# Patient Record
Sex: Female | Born: 1960
Health system: Southern US, Community
[De-identification: ages and names within clinical notes are randomized; demographics above are authoritative.]

## PROBLEM LIST (undated history)

## (undated) DIAGNOSIS — J849 Interstitial pulmonary disease, unspecified: Secondary | ICD-10-CM

## (undated) DIAGNOSIS — S92902A Unspecified fracture of left foot, initial encounter for closed fracture: Secondary | ICD-10-CM

## (undated) DIAGNOSIS — M069 Rheumatoid arthritis, unspecified: Secondary | ICD-10-CM

## (undated) DIAGNOSIS — J449 Chronic obstructive pulmonary disease, unspecified: Secondary | ICD-10-CM

## (undated) DIAGNOSIS — I509 Heart failure, unspecified: Secondary | ICD-10-CM

## (undated) DIAGNOSIS — IMO0002 Reserved for concepts with insufficient information to code with codable children: Secondary | ICD-10-CM

## (undated) DIAGNOSIS — J189 Pneumonia, unspecified organism: Secondary | ICD-10-CM

## (undated) HISTORY — PX: COLONOSCOPY: SHX174

## (undated) HISTORY — PX: WISDOM TOOTH EXTRACTION: SHX21

## (undated) HISTORY — DX: Unspecified fracture of left foot, initial encounter for closed fracture: S92.902A

## (undated) HISTORY — DX: Pneumonia, unspecified organism: J18.9

## (undated) HISTORY — DX: Rheumatoid arthritis, unspecified: M06.9

## (undated) HISTORY — DX: Reserved for concepts with insufficient information to code with codable children: IMO0002

---

## 1997-09-06 HISTORY — PX: PILONIDAL CYST EXCISION: SHX744

## 1999-12-09 ENCOUNTER — Encounter: Payer: Self-pay | Admitting: Obstetrics and Gynecology

## 1999-12-09 ENCOUNTER — Ambulatory Visit (HOSPITAL_COMMUNITY): Admission: RE | Admit: 1999-12-09 | Discharge: 1999-12-09 | Payer: Self-pay | Admitting: Obstetrics and Gynecology

## 2000-12-26 ENCOUNTER — Encounter: Payer: Self-pay | Admitting: Obstetrics and Gynecology

## 2000-12-26 ENCOUNTER — Ambulatory Visit (HOSPITAL_COMMUNITY): Admission: RE | Admit: 2000-12-26 | Discharge: 2000-12-26 | Payer: Self-pay | Admitting: Obstetrics and Gynecology

## 2001-12-08 ENCOUNTER — Encounter: Payer: Self-pay | Admitting: Obstetrics and Gynecology

## 2001-12-08 ENCOUNTER — Other Ambulatory Visit: Admission: RE | Admit: 2001-12-08 | Discharge: 2001-12-08 | Payer: Self-pay | Admitting: Obstetrics and Gynecology

## 2001-12-08 ENCOUNTER — Ambulatory Visit (HOSPITAL_COMMUNITY): Admission: RE | Admit: 2001-12-08 | Discharge: 2001-12-08 | Payer: Self-pay | Admitting: Obstetrics and Gynecology

## 2002-12-11 ENCOUNTER — Encounter: Payer: Self-pay | Admitting: Obstetrics and Gynecology

## 2002-12-11 ENCOUNTER — Ambulatory Visit (HOSPITAL_COMMUNITY): Admission: RE | Admit: 2002-12-11 | Discharge: 2002-12-11 | Payer: Self-pay | Admitting: Obstetrics and Gynecology

## 2002-12-11 ENCOUNTER — Other Ambulatory Visit: Admission: RE | Admit: 2002-12-11 | Discharge: 2002-12-11 | Payer: Self-pay | Admitting: Obstetrics and Gynecology

## 2004-01-03 ENCOUNTER — Other Ambulatory Visit: Admission: RE | Admit: 2004-01-03 | Discharge: 2004-01-03 | Payer: Self-pay | Admitting: Obstetrics and Gynecology

## 2004-01-03 ENCOUNTER — Ambulatory Visit (HOSPITAL_COMMUNITY): Admission: RE | Admit: 2004-01-03 | Discharge: 2004-01-03 | Payer: Self-pay | Admitting: Obstetrics and Gynecology

## 2005-01-05 ENCOUNTER — Ambulatory Visit (HOSPITAL_COMMUNITY): Admission: RE | Admit: 2005-01-05 | Discharge: 2005-01-05 | Payer: Self-pay | Admitting: Obstetrics and Gynecology

## 2005-01-05 ENCOUNTER — Other Ambulatory Visit: Admission: RE | Admit: 2005-01-05 | Discharge: 2005-01-05 | Payer: Self-pay | Admitting: Obstetrics and Gynecology

## 2006-01-07 ENCOUNTER — Ambulatory Visit (HOSPITAL_COMMUNITY): Admission: RE | Admit: 2006-01-07 | Discharge: 2006-01-07 | Payer: Self-pay | Admitting: Obstetrics and Gynecology

## 2006-01-07 ENCOUNTER — Other Ambulatory Visit: Admission: RE | Admit: 2006-01-07 | Discharge: 2006-01-07 | Payer: Self-pay | Admitting: Obstetrics and Gynecology

## 2006-09-06 DIAGNOSIS — IMO0002 Reserved for concepts with insufficient information to code with codable children: Secondary | ICD-10-CM

## 2006-09-06 DIAGNOSIS — R87619 Unspecified abnormal cytological findings in specimens from cervix uteri: Secondary | ICD-10-CM

## 2006-09-06 HISTORY — DX: Reserved for concepts with insufficient information to code with codable children: IMO0002

## 2006-09-06 HISTORY — PX: CERVICAL BIOPSY  W/ LOOP ELECTRODE EXCISION: SUR135

## 2006-09-06 HISTORY — DX: Unspecified abnormal cytological findings in specimens from cervix uteri: R87.619

## 2007-02-22 ENCOUNTER — Other Ambulatory Visit: Admission: RE | Admit: 2007-02-22 | Discharge: 2007-02-22 | Payer: Self-pay | Admitting: Obstetrics and Gynecology

## 2007-02-22 ENCOUNTER — Ambulatory Visit (HOSPITAL_COMMUNITY): Admission: RE | Admit: 2007-02-22 | Discharge: 2007-02-22 | Payer: Self-pay | Admitting: Obstetrics & Gynecology

## 2007-08-18 ENCOUNTER — Other Ambulatory Visit: Admission: RE | Admit: 2007-08-18 | Discharge: 2007-08-18 | Payer: Self-pay | Admitting: Obstetrics and Gynecology

## 2007-11-10 ENCOUNTER — Other Ambulatory Visit: Admission: RE | Admit: 2007-11-10 | Discharge: 2007-11-10 | Payer: Self-pay | Admitting: Obstetrics and Gynecology

## 2008-02-28 ENCOUNTER — Ambulatory Visit (HOSPITAL_COMMUNITY): Admission: RE | Admit: 2008-02-28 | Discharge: 2008-02-28 | Payer: Self-pay | Admitting: Obstetrics and Gynecology

## 2008-02-28 ENCOUNTER — Other Ambulatory Visit: Admission: RE | Admit: 2008-02-28 | Discharge: 2008-02-28 | Payer: Self-pay | Admitting: Obstetrics and Gynecology

## 2008-07-09 ENCOUNTER — Other Ambulatory Visit: Admission: RE | Admit: 2008-07-09 | Discharge: 2008-07-09 | Payer: Self-pay | Admitting: Obstetrics and Gynecology

## 2009-03-11 ENCOUNTER — Ambulatory Visit (HOSPITAL_COMMUNITY): Admission: RE | Admit: 2009-03-11 | Discharge: 2009-03-11 | Payer: Self-pay | Admitting: Obstetrics and Gynecology

## 2009-03-26 ENCOUNTER — Ambulatory Visit: Payer: Self-pay | Admitting: Family Medicine

## 2009-03-26 DIAGNOSIS — M2550 Pain in unspecified joint: Secondary | ICD-10-CM | POA: Insufficient documentation

## 2009-03-26 DIAGNOSIS — F329 Major depressive disorder, single episode, unspecified: Secondary | ICD-10-CM | POA: Insufficient documentation

## 2009-03-26 DIAGNOSIS — G5600 Carpal tunnel syndrome, unspecified upper limb: Secondary | ICD-10-CM | POA: Insufficient documentation

## 2009-03-27 LAB — CONVERTED CEMR LAB
Anti Nuclear Antibody(ANA): NEGATIVE
Basophils Absolute: 0.1 10*3/uL (ref 0.0–0.1)
CRP, High Sensitivity: 14 — ABNORMAL HIGH (ref 0.00–5.00)
Calcium: 9.3 mg/dL (ref 8.4–10.5)
Chloride: 106 meq/L (ref 96–112)
Hemoglobin: 13.1 g/dL (ref 12.0–15.0)
Lymphs Abs: 2.2 10*3/uL (ref 0.7–4.0)
Monocytes Absolute: 0.5 10*3/uL (ref 0.1–1.0)
Neutrophils Relative %: 56.2 % (ref 43.0–77.0)
Platelets: 315 10*3/uL (ref 150.0–400.0)
Potassium: 4.1 meq/L (ref 3.5–5.1)
RBC: 4.19 M/uL (ref 3.87–5.11)

## 2009-07-07 DIAGNOSIS — M069 Rheumatoid arthritis, unspecified: Secondary | ICD-10-CM

## 2009-07-07 HISTORY — DX: Rheumatoid arthritis, unspecified: M06.9

## 2010-04-09 ENCOUNTER — Ambulatory Visit (HOSPITAL_COMMUNITY): Admission: RE | Admit: 2010-04-09 | Discharge: 2010-04-09 | Payer: Self-pay | Admitting: Obstetrics and Gynecology

## 2010-09-27 ENCOUNTER — Encounter: Payer: Self-pay | Admitting: Obstetrics and Gynecology

## 2011-04-21 ENCOUNTER — Other Ambulatory Visit: Payer: Self-pay | Admitting: Obstetrics and Gynecology

## 2011-04-21 DIAGNOSIS — Z1231 Encounter for screening mammogram for malignant neoplasm of breast: Secondary | ICD-10-CM

## 2011-05-20 ENCOUNTER — Ambulatory Visit (HOSPITAL_COMMUNITY): Payer: Managed Care, Other (non HMO)

## 2011-05-21 ENCOUNTER — Ambulatory Visit (HOSPITAL_COMMUNITY)
Admission: RE | Admit: 2011-05-21 | Discharge: 2011-05-21 | Disposition: A | Discharge status: Home / Self Care | Payer: Managed Care, Other (non HMO) | Source: Ambulatory Visit | Attending: Obstetrics and Gynecology | Admitting: Obstetrics and Gynecology

## 2011-05-21 DIAGNOSIS — Z1231 Encounter for screening mammogram for malignant neoplasm of breast: Secondary | ICD-10-CM | POA: Insufficient documentation

## 2012-04-24 ENCOUNTER — Other Ambulatory Visit: Payer: Self-pay | Admitting: Obstetrics and Gynecology

## 2012-04-24 DIAGNOSIS — Z1231 Encounter for screening mammogram for malignant neoplasm of breast: Secondary | ICD-10-CM

## 2012-05-22 ENCOUNTER — Ambulatory Visit (HOSPITAL_COMMUNITY)
Admission: RE | Admit: 2012-05-22 | Discharge: 2012-05-22 | Disposition: A | Discharge status: Home / Self Care | Payer: Managed Care, Other (non HMO) | Source: Ambulatory Visit | Attending: Obstetrics and Gynecology | Admitting: Obstetrics and Gynecology

## 2012-05-22 DIAGNOSIS — Z1231 Encounter for screening mammogram for malignant neoplasm of breast: Secondary | ICD-10-CM

## 2013-04-26 ENCOUNTER — Other Ambulatory Visit: Payer: Self-pay | Admitting: Obstetrics and Gynecology

## 2013-04-26 DIAGNOSIS — Z1231 Encounter for screening mammogram for malignant neoplasm of breast: Secondary | ICD-10-CM

## 2013-05-04 ENCOUNTER — Encounter: Payer: Self-pay | Admitting: Obstetrics and Gynecology

## 2013-05-04 ENCOUNTER — Other Ambulatory Visit: Payer: Self-pay | Admitting: Obstetrics and Gynecology

## 2013-05-04 ENCOUNTER — Ambulatory Visit: Payer: Self-pay | Admitting: Obstetrics and Gynecology

## 2013-05-04 ENCOUNTER — Ambulatory Visit (INDEPENDENT_AMBULATORY_CARE_PROVIDER_SITE_OTHER): Payer: Managed Care, Other (non HMO) | Admitting: Obstetrics and Gynecology

## 2013-05-04 VITALS — BP 122/78 | HR 100 | Resp 20 | Ht 63.25 in | Wt 124.0 lb

## 2013-05-04 DIAGNOSIS — Z01419 Encounter for gynecological examination (general) (routine) without abnormal findings: Secondary | ICD-10-CM

## 2013-05-04 DIAGNOSIS — Z0000 Encounter for general adult medical examination without abnormal findings: Secondary | ICD-10-CM

## 2013-05-04 LAB — HEMOGLOBIN, FINGERSTICK: Hemoglobin, fingerstick: 14.5 g/dL (ref 12.0–16.0)

## 2013-05-04 LAB — POCT URINALYSIS DIPSTICK
Bilirubin, UA: NEGATIVE
Glucose, UA: NEGATIVE
Ketones, UA: NEGATIVE
Leukocytes, UA: NEGATIVE
Nitrite, UA: NEGATIVE
Urobilinogen, UA: NEGATIVE

## 2013-05-04 MED ORDER — ESCITALOPRAM OXALATE 10 MG PO TABS: 10.0000 mg | ORAL_TABLET | Freq: Every day | ORAL | Status: DC

## 2013-05-04 NOTE — Patient Instructions (Signed)

## 2013-05-04 NOTE — Progress Notes (Signed)
52 y.o.   Married    Caucasian   female   G1P1   here for annual exam.    Patient's last menstrual period was 09/06/2004.          Sexually active: yes  The current method of family planning is post menopausal status.    Exercising: walking 5-7 days a week Last mammogram:  05/22/12 neg, appt 05/23/13 Last pap smear:05/03/12 neg History of abnormal pap CIN 3 05/2007  Smoking: 1/2 pack of cigarettes a day Alcohol: occ glass of wine Last colonoscopy: never Last Bone Density:  05/22/12 normal Last tetanus shot: less than 10 years Last cholesterol check: 11/14/09 normal  Hgb: 14.5                Urine: neg   Family History  Problem Relation Age of Onset  . Diabetes Mother   . Rheum arthritis Mother   . Diabetes Father   . COPD Father   . Bronchitis Father   . Cancer Brother     Patient Active Problem List   Diagnosis Date Noted  . DEPRESSION 03/26/2009  . CARPAL TUNNEL SYNDROME 03/26/2009  . ARTHRALGIA 03/26/2009    Past Medical History  Diagnosis Date  . Abnormal Pap smear 09/2006    HGSIL CIN 2/VAIN/ CIN 3/VAIN-3 CIS  . RA (rheumatoid arthritis) 07/2009    Past Surgical History  Procedure Laterality Date  . Cervical biopsy  w/ loop electrode excision  2008    CIN 3  . Pilonidal cyst excision  1999    I&D    Allergies: Codeine and Penicillins  Current Outpatient Prescriptions  Medication Sig Dispense Refill  . escitalopram (LEXAPRO) 10 MG tablet daily.       Marland Kitchen leflunomide (ARAVA) 20 MG tablet daily.       Marland Kitchen SIMPONI 50 MG/0.5ML SOLN every 30 (thirty) days.        No current facility-administered medications for this visit.    ROS: Pertinent items are noted in HPI.  Social Hx: married, one daughter Tamala Julian   Exam:    BP 122/78  Pulse 100  Resp 20  Ht 5' 3.25" (1.607 m)  Wt 124 lb (56.246 kg)  BMI 21.78 kg/m2  LMP 01/01/2006ht stable and wt up 3 pounds from last yr   Wt Readings from Last 3 Encounters:  05/04/13 124 lb (56.246 kg)  03/26/09 147 lb  (66.679 kg)     Ht Readings from Last 3 Encounters:  05/04/13 5' 3.25" (1.607 m)  03/26/09 5' 4.75" (1.645 m)    General appearance: alert, cooperative and appears stated age Head: Normocephalic, without obvious abnormality, atraumatic Neck: no adenopathy, supple, symmetrical, trachea midline and thyroid not enlarged, symmetric, no tenderness/mass/nodules Lungs: clear to auscultation bilaterally Breasts: Inspection negative, No nipple retraction or dimpling, No nipple discharge or bleeding, No axillary or supraclavicular adenopathy, Normal to palpation without dominant masses Heart: regular rate and rhythm Abdomen: soft, non-tender; bowel sounds normal; no masses,  no organomegaly Extremities: extremities normal, atraumatic, no cyanosis or edema Skin: Skin color, texture, turgor normal. No rashes or lesions Lymph nodes: Cervical, supraclavicular, and axillary nodes normal. No abnormal inguinal nodes palpated Neurologic: Grossly normal   Pelvic: External genitalia:  no lesions              Urethra:  normal appearing urethra with no masses, tenderness or lesions              Bartholins and Skenes: normal  Vagina: normal appearing vagina with normal color and discharge, no lesions              Cervix: normal appearance              Pap taken: yes        Bimanual Exam:  Uterus:  uterus is normal size, shape, consistency and nontender                                      Adnexa: normal adnexa in size, nontender and no masses                                      Rectovaginal: Confirms                                      Anus:  normal sphincter tone, no lesions  A: normal menopausal exam, no HRT     CIN 3 on LEEP 2008     Rheumatoid Arthritis     P:     mammogram counseled on breast self exam, mammography screening, adequate intake of calcium and vitamin D, diet and exercise return annually or prn   Last pap 01/11/2011, ASCUS neg HPV; cotesting due 2015 Refill lexapro  at pt request - doesn't want to come off yet  An After Visit Summary was printed and given to the patient.

## 2013-05-09 ENCOUNTER — Other Ambulatory Visit: Payer: Self-pay | Admitting: *Deleted

## 2013-05-09 MED ORDER — ESCITALOPRAM OXALATE 10 MG PO TABS: 10.0000 mg | ORAL_TABLET | Freq: Every day | ORAL | Status: DC

## 2013-05-09 NOTE — Telephone Encounter (Signed)
Fax request from pharmacy for RX for 90 day supply.  This is sent.

## 2013-05-23 ENCOUNTER — Ambulatory Visit (HOSPITAL_COMMUNITY)
Admission: RE | Admit: 2013-05-23 | Discharge: 2013-05-23 | Disposition: A | Discharge status: Home / Self Care | Payer: Managed Care, Other (non HMO) | Source: Ambulatory Visit | Attending: Obstetrics and Gynecology | Admitting: Obstetrics and Gynecology

## 2013-05-23 DIAGNOSIS — Z1231 Encounter for screening mammogram for malignant neoplasm of breast: Secondary | ICD-10-CM | POA: Insufficient documentation

## 2013-05-26 ENCOUNTER — Emergency Department (HOSPITAL_COMMUNITY)
Admission: EM | Admit: 2013-05-26 | Discharge: 2013-05-26 | Disposition: A | Discharge status: Home / Self Care | Payer: Managed Care, Other (non HMO) | Attending: Emergency Medicine | Admitting: Emergency Medicine

## 2013-05-26 ENCOUNTER — Encounter (HOSPITAL_COMMUNITY): Payer: Self-pay | Admitting: Emergency Medicine

## 2013-05-26 DIAGNOSIS — M069 Rheumatoid arthritis, unspecified: Secondary | ICD-10-CM | POA: Insufficient documentation

## 2013-05-26 DIAGNOSIS — F17200 Nicotine dependence, unspecified, uncomplicated: Secondary | ICD-10-CM | POA: Insufficient documentation

## 2013-05-26 DIAGNOSIS — Y92410 Unspecified street and highway as the place of occurrence of the external cause: Secondary | ICD-10-CM | POA: Insufficient documentation

## 2013-05-26 DIAGNOSIS — Z79899 Other long term (current) drug therapy: Secondary | ICD-10-CM | POA: Insufficient documentation

## 2013-05-26 DIAGNOSIS — Y9389 Activity, other specified: Secondary | ICD-10-CM | POA: Insufficient documentation

## 2013-05-26 DIAGNOSIS — Z880 Allergy status to penicillin: Secondary | ICD-10-CM | POA: Insufficient documentation

## 2013-05-26 DIAGNOSIS — Z043 Encounter for examination and observation following other accident: Secondary | ICD-10-CM | POA: Insufficient documentation

## 2013-05-26 MED ORDER — CYCLOBENZAPRINE HCL 5 MG PO TABS: 5.0000 mg | ORAL_TABLET | Freq: Two times a day (BID) | ORAL | Status: DC | PRN
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
## 2013-05-26 NOTE — ED Provider Notes (Signed)
Medical screening examination/treatment/procedure(s) were performed by non-physician practitioner and as supervising physician I was immediately available for consultation/collaboration.   Shanna Cisco, MD 05/26/13 1550

## 2014-02-16 ENCOUNTER — Encounter (HOSPITAL_COMMUNITY): Payer: Self-pay | Admitting: Emergency Medicine

## 2014-02-16 ENCOUNTER — Emergency Department (HOSPITAL_COMMUNITY): Payer: Managed Care, Other (non HMO)

## 2014-02-16 ENCOUNTER — Inpatient Hospital Stay (HOSPITAL_COMMUNITY)
Admission: EM | Admit: 2014-02-16 | Discharge: 2014-02-24 | DRG: 871 | Disposition: A | Discharge status: Home / Self Care | Payer: Managed Care, Other (non HMO) | Attending: Family Medicine | Admitting: Family Medicine

## 2014-02-16 DIAGNOSIS — Z91038 Other insect allergy status: Secondary | ICD-10-CM

## 2014-02-16 DIAGNOSIS — S27309A Unspecified injury of lung, unspecified, initial encounter: Secondary | ICD-10-CM

## 2014-02-16 DIAGNOSIS — Z79899 Other long term (current) drug therapy: Secondary | ICD-10-CM

## 2014-02-16 DIAGNOSIS — A419 Sepsis, unspecified organism: Secondary | ICD-10-CM | POA: Diagnosis present

## 2014-02-16 DIAGNOSIS — M2550 Pain in unspecified joint: Secondary | ICD-10-CM

## 2014-02-16 DIAGNOSIS — F3289 Other specified depressive episodes: Secondary | ICD-10-CM

## 2014-02-16 DIAGNOSIS — R0902 Hypoxemia: Secondary | ICD-10-CM | POA: Diagnosis not present

## 2014-02-16 DIAGNOSIS — F17200 Nicotine dependence, unspecified, uncomplicated: Secondary | ICD-10-CM | POA: Diagnosis present

## 2014-02-16 DIAGNOSIS — B370 Candidal stomatitis: Secondary | ICD-10-CM | POA: Diagnosis not present

## 2014-02-16 DIAGNOSIS — I959 Hypotension, unspecified: Secondary | ICD-10-CM

## 2014-02-16 DIAGNOSIS — Z880 Allergy status to penicillin: Secondary | ICD-10-CM

## 2014-02-16 DIAGNOSIS — J13 Pneumonia due to Streptococcus pneumoniae: Secondary | ICD-10-CM | POA: Diagnosis present

## 2014-02-16 DIAGNOSIS — E876 Hypokalemia: Secondary | ICD-10-CM | POA: Diagnosis present

## 2014-02-16 DIAGNOSIS — Z888 Allergy status to other drugs, medicaments and biological substances status: Secondary | ICD-10-CM

## 2014-02-16 DIAGNOSIS — F329 Major depressive disorder, single episode, unspecified: Secondary | ICD-10-CM

## 2014-02-16 DIAGNOSIS — Z8261 Family history of arthritis: Secondary | ICD-10-CM

## 2014-02-16 DIAGNOSIS — Z836 Family history of other diseases of the respiratory system: Secondary | ICD-10-CM

## 2014-02-16 DIAGNOSIS — I5032 Chronic diastolic (congestive) heart failure: Secondary | ICD-10-CM

## 2014-02-16 DIAGNOSIS — R7881 Bacteremia: Secondary | ICD-10-CM

## 2014-02-16 DIAGNOSIS — J189 Pneumonia, unspecified organism: Secondary | ICD-10-CM

## 2014-02-16 DIAGNOSIS — A403 Sepsis due to Streptococcus pneumoniae: Secondary | ICD-10-CM

## 2014-02-16 DIAGNOSIS — Z833 Family history of diabetes mellitus: Secondary | ICD-10-CM

## 2014-02-16 DIAGNOSIS — M069 Rheumatoid arthritis, unspecified: Secondary | ICD-10-CM | POA: Diagnosis present

## 2014-02-16 DIAGNOSIS — T502X5A Adverse effect of carbonic-anhydrase inhibitors, benzothiadiazides and other diuretics, initial encounter: Secondary | ICD-10-CM | POA: Diagnosis present

## 2014-02-16 DIAGNOSIS — I5033 Acute on chronic diastolic (congestive) heart failure: Secondary | ICD-10-CM | POA: Diagnosis present

## 2014-02-16 LAB — CBC WITH DIFFERENTIAL/PLATELET
BASOS PCT: 2 % — AB (ref 0–1)
Basophils Absolute: 0.2 10*3/uL — ABNORMAL HIGH (ref 0.0–0.1)
EOS ABS: 0 10*3/uL (ref 0.0–0.7)
EOS PCT: 0 % (ref 0–5)
HEMATOCRIT: 38.3 % (ref 36.0–46.0)
Hemoglobin: 13.4 g/dL (ref 12.0–15.0)
LYMPHS ABS: 0.7 10*3/uL (ref 0.7–4.0)
Lymphocytes Relative: 6 % — ABNORMAL LOW (ref 12–46)
MCH: 29.5 pg (ref 26.0–34.0)
MCHC: 35 g/dL (ref 30.0–36.0)
MCV: 84.2 fL (ref 78.0–100.0)
Monocytes Absolute: 0.1 10*3/uL (ref 0.1–1.0)
Monocytes Relative: 1 % — ABNORMAL LOW (ref 3–12)
NEUTROS PCT: 91 % — AB (ref 43–77)
Neutro Abs: 9.9 10*3/uL — ABNORMAL HIGH (ref 1.7–7.7)
PLATELETS: 213 10*3/uL (ref 150–400)
RBC: 4.55 MIL/uL (ref 3.87–5.11)
RDW: 12.9 % (ref 11.5–15.5)
WBC Morphology: INCREASED
WBC: 10.9 10*3/uL — AB (ref 4.0–10.5)

## 2014-02-16 LAB — URINALYSIS, ROUTINE W REFLEX MICROSCOPIC
Glucose, UA: NEGATIVE mg/dL
Hgb urine dipstick: NEGATIVE
Ketones, ur: NEGATIVE mg/dL
NITRITE: POSITIVE — AB
PH: 5 (ref 5.0–8.0)
Protein, ur: 100 mg/dL — AB
SPECIFIC GRAVITY, URINE: 1.019 (ref 1.005–1.030)
Urobilinogen, UA: 2 mg/dL — ABNORMAL HIGH (ref 0.0–1.0)

## 2014-02-16 LAB — COMPREHENSIVE METABOLIC PANEL
ALT: 13 U/L (ref 0–35)
AST: 28 U/L (ref 0–37)
Albumin: 2.3 g/dL — ABNORMAL LOW (ref 3.5–5.2)
Alkaline Phosphatase: 122 U/L — ABNORMAL HIGH (ref 39–117)
BILIRUBIN TOTAL: 1.9 mg/dL — AB (ref 0.3–1.2)
BUN: 27 mg/dL — AB (ref 6–23)
CALCIUM: 8.9 mg/dL (ref 8.4–10.5)
CO2: 24 mEq/L (ref 19–32)
CREATININE: 1.08 mg/dL (ref 0.50–1.10)
Chloride: 88 mEq/L — ABNORMAL LOW (ref 96–112)
GFR, EST AFRICAN AMERICAN: 66 mL/min — AB (ref >90)
GFR, EST NON AFRICAN AMERICAN: 57 mL/min — AB (ref >90)
Glucose, Bld: 158 mg/dL — ABNORMAL HIGH (ref 70–99)
Potassium: 3.5 mEq/L — ABNORMAL LOW (ref 3.7–5.3)
Sodium: 132 mEq/L — ABNORMAL LOW (ref 137–147)
TOTAL PROTEIN: 6.9 g/dL (ref 6.0–8.3)

## 2014-02-16 LAB — EXPECTORATED SPUTUM ASSESSMENT W GRAM STAIN, RFLX TO RESP C

## 2014-02-16 LAB — I-STAT CG4 LACTIC ACID, ED: LACTIC ACID, VENOUS: 4.78 mmol/L — AB (ref 0.5–2.2)

## 2014-02-16 LAB — URINE MICROSCOPIC-ADD ON

## 2014-02-16 LAB — EXPECTORATED SPUTUM ASSESSMENT W REFEX TO RESP CULTURE

## 2014-02-16 LAB — STREP PNEUMONIAE URINARY ANTIGEN: Strep Pneumo Urinary Antigen: POSITIVE — AB

## 2014-02-16 LAB — MRSA PCR SCREENING: MRSA by PCR: NEGATIVE

## 2014-02-16 MED ORDER — ALBUTEROL SULFATE (2.5 MG/3ML) 0.083% IN NEBU
2.5000 mg | INHALATION_SOLUTION | RESPIRATORY_TRACT | Status: DC | PRN
Start: 2014-02-16 — End: 2014-02-24
  Administered 2014-02-18 – 2014-02-19 (×2): 2.5 mg via RESPIRATORY_TRACT
  Filled 2014-02-16 (×2): qty 3

## 2014-02-16 MED ORDER — ESCITALOPRAM OXALATE 10 MG PO TABS
10.0000 mg | ORAL_TABLET | Freq: Every day | ORAL | Status: DC
  Administered 2014-02-17 – 2014-02-24 (×8): 10 mg via ORAL
  Filled 2014-02-16 (×9): qty 1

## 2014-02-16 MED ORDER — DEXTROSE 5 % IV SOLN
1.0000 g | INTRAVENOUS | Status: DC
  Administered 2014-02-16: 1 g via INTRAVENOUS
  Filled 2014-02-16: qty 10

## 2014-02-16 MED ORDER — LEFLUNOMIDE 20 MG PO TABS
20.0000 mg | ORAL_TABLET | Freq: Every day | ORAL | Status: DC
  Administered 2014-02-16 – 2014-02-20 (×5): 20 mg via ORAL
  Filled 2014-02-16 (×6): qty 1

## 2014-02-16 MED ORDER — SODIUM CHLORIDE 0.9 % IV BOLUS (SEPSIS): 1000.0000 mL | Freq: Once | INTRAVENOUS | Status: AC

## 2014-02-16 MED ORDER — SODIUM CHLORIDE 0.9 % IV BOLUS (SEPSIS): 1000.0000 mL | Freq: Once | INTRAVENOUS | Status: DC

## 2014-02-16 MED ORDER — SODIUM CHLORIDE 0.9 % IV BOLUS (SEPSIS)
1000.0000 mL | Freq: Once | INTRAVENOUS | Status: AC
  Administered 2014-02-16: 1000 mL via INTRAVENOUS

## 2014-02-16 MED ORDER — ONDANSETRON HCL 4 MG/2ML IJ SOLN
4.0000 mg | Freq: Once | INTRAMUSCULAR | Status: AC
  Administered 2014-02-16: 4 mg via INTRAVENOUS
  Filled 2014-02-16: qty 2

## 2014-02-16 MED ORDER — SODIUM CHLORIDE 0.9 % IV SOLN
INTRAVENOUS | Status: DC
  Administered 2014-02-16: 22:00:00 via INTRAVENOUS
  Administered 2014-02-16: 1000 mL via INTRAVENOUS
  Administered 2014-02-17 – 2014-02-19 (×5): via INTRAVENOUS

## 2014-02-16 MED ORDER — DEXTROSE 5 % IV SOLN: 500.0000 mg | INTRAVENOUS | Status: DC

## 2014-02-16 MED ORDER — PROMETHAZINE HCL 25 MG PO TABS: 12.5000 mg | ORAL_TABLET | Freq: Four times a day (QID) | ORAL | Status: DC | PRN

## 2014-02-16 MED ORDER — ALPRAZOLAM 0.25 MG PO TABS
0.2500 mg | ORAL_TABLET | Freq: Every evening | ORAL | Status: DC | PRN
  Administered 2014-02-16 – 2014-02-23 (×8): 0.25 mg via ORAL
  Filled 2014-02-16 (×8): qty 1

## 2014-02-16 MED ORDER — SODIUM CHLORIDE 0.9 % IV SOLN
INTRAVENOUS | Status: DC
  Administered 2014-02-16: 13:00:00 via INTRAVENOUS

## 2014-02-16 MED ORDER — ENOXAPARIN SODIUM 40 MG/0.4ML ~~LOC~~ SOLN
40.0000 mg | SUBCUTANEOUS | Status: DC
  Administered 2014-02-16 – 2014-02-23 (×8): 40 mg via SUBCUTANEOUS
  Filled 2014-02-16 (×9): qty 0.4

## 2014-02-16 MED ORDER — LEVOFLOXACIN IN D5W 750 MG/150ML IV SOLN
750.0000 mg | INTRAVENOUS | Status: AC
  Administered 2014-02-16 – 2014-02-20 (×5): 750 mg via INTRAVENOUS
  Filled 2014-02-16 (×5): qty 150

## 2014-02-16 NOTE — ED Notes (Signed)
Attempted to call report. RN unavailable at this time. To call back.

## 2014-02-16 NOTE — H&P (Signed)
[facility] History and Physical  Kimberly Shelton ZHG:[id number] DOB: Jan 29, 1961 DOA: 02/16/2014  Referring physician: ED physician PCP: Kristian Covey, MD   Chief Complaint: shortness of breath with weakness   HPI:  Pt is 53 yo female who presented to [facility] ED with main concern of several days duration of progressively worsening shortness of breath, associated with productive cough of yellow sputum, malaise, poor oral intake, worse with exertion and occasionally improved with rest. PT explains she was seen by her PCP 5 days PTA and was treated with Z-Pack with no significant improvement in her symptoms. She denies similar events in the past, no known recent sick contacts or exposures.   In ED, pt noted to have SBP in 80-90's but said she typically has low BP in 90's/50's at home. Pt also note to have HR in 110 - 120's with RR in high 20's. CXR c/w PNA and TRH asked to admit to SDU for further evlauation.   Assessment and Plan: Active Problems:   PNA (pneumonia) - admit to SDU for now - place on Levaquin IV - order sputum analysis, urine legionella and strep pneumo, blood cultures  - provide oxygen BD's as needed   Sepsis  - pt meets criteria for sepsis given PNA, tachycardia, hypotension, elevated lactic acid at 4.8, leukocytosis  - potential source can be UTI - pt started on Levaquin as it should be adequate in coverage  - will monitor clinical response and readjust the regimen as indicated - continue IVF   ? UTI - Levaquin IV as noted above - follow upon urine culture    Hypokalemia - mild, will supplement and repeat BMP in AM  Radiological Exams on Admission: Dg Chest 2 View  02/16/2014   Right upper and left lower lobe consolidation consistent with multi lobar pneumonia. Radiographic follow up recommended to document clearing.   Code Status: Full Family Communication: Pt and husband at bedside Disposition Plan: Admit for further evaluation     Review of Systems:   Constitutional: Negative for diaphoresis.  HENT: Negative for hearing loss, ear pain, nosebleeds, congestion, sore throat, neck pain, tinnitus and ear discharge.   Eyes: Negative for blurred vision, double vision, photophobia, pain, discharge and redness.  Respiratory: Negative for wheezing and stridor.   Cardiovascular: Negative for claudication and leg swelling.  Gastrointestinal: Negative for heartburn, constipation, blood in stool and melena.  Genitourinary: Negative for dysuria, urgency, frequency, hematuria and flank pain.  Musculoskeletal: Negative for myalgias, back pain, joint pain and falls.  Skin: Negative for itching and rash.  Neurological: Negative for dizziness, speech change, focal weakness, loss of consciousness and headaches.  Endo/Heme/Allergies: Negative for environmental allergies and polydipsia. Does not bruise/bleed easily.  Psychiatric/Behavioral: Negative for suicidal ideas. The patient is not nervous/anxious.      Past Medical History  Diagnosis Date  . Abnormal Pap smear 09/2006    HGSIL CIN 2/VAIN/ CIN 3/VAIN-3 CIS  . RA (rheumatoid arthritis) 07/2009    Past Surgical History  Procedure Laterality Date  . Cervical biopsy  w/ loop electrode excision  2008    CIN 3  . Pilonidal cyst excision  1999    I&D    Social History:  reports that she has been smoking Cigarettes.  She has been smoking about 1.00 pack per day. She has never used smokeless tobacco. She reports that she drinks about .5 ounces of alcohol per week. She reports that she does not use illicit drugs.  Allergies  Allergen Reactions  .  Bee Venom   . Codeine     "seeing spots"  . Penicillins     REACTION: rash, itiching    Family History  Problem Relation Age of Onset  . Diabetes Mother   . Rheum arthritis Mother   . Diabetes Father   . COPD Father   . Bronchitis Father   . Cancer Brother     Prior to Admission medications   Medication Sig Start Date End Date Taking? Authorizing  Provider  escitalopram (LEXAPRO) 10 MG tablet Take 1 tablet (10 mg total) by mouth daily. 05/09/13  Yes Annamaria Boots, MD  leflunomide (ARAVA) 20 MG tablet Take 20 mg by mouth daily.  04/28/13  Yes Historical Provider, MD  promethazine (PHENERGAN) 12.5 MG tablet Take 12.5 mg by mouth every 6 (six) hours as needed for nausea or vomiting (nausea and vomiting).   Yes Historical Provider, MD  SIMPONI 50 MG/0.5ML SOLN every 30 (thirty) days.  04/10/13   Historical Provider, MD    Physical Exam: Filed Vitals:   02/16/14 1700 02/16/14 1800 02/16/14 1900 02/16/14 2100  BP: 87/48 79/63 97/52  90/36  Pulse: 103 120 117 113  Temp:      TempSrc:      Resp: 26 28 26 29   Height:      Weight:      SpO2: 92% 95% 94% 94%    Physical Exam  Constitutional: Appears well-developed and well-nourished. No distress.  HENT: Normocephalic. External right and left ear normal. Dry MM.  Eyes: Conjunctivae and EOM are normal. PERRLA, no scleral icterus.  Neck: Normal ROM. Neck supple. No JVD. No tracheal deviation. No thyromegaly.  CVS: Regular rhythm, tachycardic, S1/S2 +, no murmurs, no gallops, no carotid bruit.  Pulmonary: Course breath sounds with rhonchi at bases  Abdominal: Soft. BS +,  no distension, tenderness, rebound or guarding.  Musculoskeletal: Normal range of motion. No edema and no tenderness.  Lymphadenopathy: No lymphadenopathy noted, cervical, inguinal. Neuro: Alert. Normal reflexes, muscle tone coordination. No cranial nerve deficit. Skin: Skin is warm and dry. No rash noted. Not diaphoretic. No erythema. No pallor.  Psychiatric: Normal mood and affect.  Labs on Admission:  Basic Metabolic Panel:  Recent Labs Lab 02/16/14 1308  NA 132*  K 3.5*  CL 88*  CO2 24  GLUCOSE 158*  BUN 27*  CREATININE 1.08  CALCIUM 8.9   Liver Function Tests:  Recent Labs Lab 02/16/14 1308  AST 28  ALT 13  ALKPHOS 122*  BILITOT 1.9*  PROT 6.9  ALBUMIN 2.3*   CBC:  Recent Labs Lab  02/16/14 1308  WBC 10.9*  NEUTROABS 9.9*  HGB 13.4  HCT 38.3  MCV 84.2  PLT 213   EKG: Normal sinus rhythm, no ST/T wave changes  02/18/14, MD  [facility] Pager [phone]  If 7PM-7AM, please contact night-coverage [email or web address] Password [facility] 02/16/2014, 9:14 PM

## 2014-02-16 NOTE — ED Provider Notes (Signed)
CSN: [id number]     Arrival date & time 02/16/14  1059 History   First MD Initiated Contact with Patient 02/16/14 1208     Chief Complaint  Patient presents with  . Weakness  . Anorexia  . many complaints     (Consider location/radiation/quality/duration/timing/severity/associated sxs/prior Treatment) Patient is a 53 y.o. female presenting with weakness. The history is provided by the patient.  Weakness   patient here complaining of one-week history of whole-body weakness and anorexia. Saw her doctor 5 days ago and diagnosed with URI and placed on a Z-Pak for a productive cough.Marland Kitchen She's been having loose bowel movements without blood. She does note some dark urine. She has been given antinausea medication which has not been working. She denies any fever or chills. She denies any abdominal pain or chest pain. Notes dizziness when standing. Symptoms persistent and nothing makes them better. No prior history of same.  Past Medical History  Diagnosis Date  . Abnormal Pap smear 09/2006    HGSIL CIN 2/VAIN/ CIN 3/VAIN-3 CIS  . RA (rheumatoid arthritis) 07/2009   Past Surgical History  Procedure Laterality Date  . Cervical biopsy  w/ loop electrode excision  2008    CIN 3  . Pilonidal cyst excision  1999    I&D   Family History  Problem Relation Age of Onset  . Diabetes Mother   . Rheum arthritis Mother   . Diabetes Father   . COPD Father   . Bronchitis Father   . Cancer Brother    History  Substance Use Topics  . Smoking status: Current Every Day Smoker -- 1.00 packs/day    Types: Cigarettes  . Smokeless tobacco: Never Used     Comment: 1/2 cigarettes a day  . Alcohol Use: 0.5 oz/week    1 drink(s) per week     Comment: occ glass of wine   OB History   Grav Para Term Preterm Abortions TAB SAB Ect Mult Living   1 1        1      Review of Systems  Neurological: Positive for weakness.  All other systems reviewed and are negative.     Allergies  Bee venom; Codeine;  and Penicillins  Home Medications   Prior to Admission medications   Medication Sig Start Date End Date Taking? Authorizing Provider  escitalopram (LEXAPRO) 10 MG tablet Take 1 tablet (10 mg total) by mouth daily. 05/09/13  Yes 07/09/13, MD  leflunomide (ARAVA) 20 MG tablet Take 20 mg by mouth daily.  04/28/13  Yes Historical Provider, MD  promethazine (PHENERGAN) 12.5 MG tablet Take 12.5 mg by mouth every 6 (six) hours as needed for nausea or vomiting (nausea and vomiting).   Yes Historical Provider, MD  SIMPONI 50 MG/0.5ML SOLN every 30 (thirty) days.  04/10/13   Historical Provider, MD   BP 89/55  Pulse 131  Temp(Src) 98 F (36.7 C) (Oral)  Resp 18  SpO2 94%  LMP 09/06/2004 Physical Exam  Nursing note and vitals reviewed. Constitutional: She is oriented to person, place, and time. She appears well-developed and well-nourished.  Non-toxic appearance. No distress.  HENT:  Head: Normocephalic and atraumatic.  Eyes: Conjunctivae, EOM and lids are normal. Pupils are equal, round, and reactive to light.  Neck: Normal range of motion. Neck supple. No tracheal deviation present. No mass present.  Cardiovascular: Regular rhythm and normal heart sounds.  Tachycardia present.  Exam reveals no gallop.   No murmur heard. Pulmonary/Chest: Effort  normal and breath sounds normal. No stridor. No respiratory distress. She has no decreased breath sounds. She has no wheezes. She has no rhonchi. She has no rales.  Abdominal: Soft. Normal appearance and bowel sounds are normal. She exhibits no distension. There is no tenderness. There is no rebound and no CVA tenderness.  Musculoskeletal: Normal range of motion. She exhibits no edema and no tenderness.  Neurological: She is alert and oriented to person, place, and time. She has normal strength. No cranial nerve deficit or sensory deficit. GCS eye subscore is 4. GCS verbal subscore is 5. GCS motor subscore is 6.  Skin: Skin is warm and dry. No  abrasion and no rash noted.  Psychiatric: She has a normal mood and affect. Her speech is normal and behavior is normal.    ED Course  Procedures (including critical care time) Labs Review Labs Reviewed  URINE CULTURE  URINALYSIS, ROUTINE W REFLEX MICROSCOPIC  CBC WITH DIFFERENTIAL  COMPREHENSIVE METABOLIC PANEL  I-STAT CG4 LACTIC ACID, ED    Imaging Review No results found.   EKG Interpretation   Date/Time:  Saturday February 16 2014 12:20:12 EDT Ventricular Rate:  116 PR Interval:  114 QRS Duration: 83 QT Interval:  314 QTC Calculation: 436 R Axis:   70 Text Interpretation:  Sinus tachycardia Right atrial enlargement Consider  left ventricular hypertrophy Confirmed by Freida Busman  MD, Corneshia Hines ([id number]) on  02/16/2014 2:24:26 PM      MDM   Final diagnoses:  None    Patient given IV fluids for her hypotension tachycardia. Patient's elevated lactate noted. She is mentating appropriately. Reassessment shows improvement of her heart rate after given IV fluids. Blood cultures obtained and patient started on treatment for community acquired pneumonia. Spoke with [facility] and she will be admitted to step down unit   CRITICAL CARE Performed by: Toy Baker Total critical care time: 50 Critical care time was exclusive of separately billable procedures and treating other patients. Critical care was necessary to treat or prevent imminent or life-threatening deterioration. Critical care was time spent personally by me on the following activities: development of treatment plan with patient and/or surrogate as well as nursing, discussions with consultants, evaluation of patient's response to treatment, examination of patient, obtaining history from patient or surrogate, ordering and performing treatments and interventions, ordering and review of laboratory studies, ordering and review of radiographic studies, pulse oximetry and re-evaluation of patient's condition.     Toy Baker, MD 02/16/14 1444

## 2014-02-16 NOTE — ED Notes (Signed)
I- stat cg4 was performed and results were printed out and given to Dr. Freida Busman. Nurse was notified.

## 2014-02-16 NOTE — ED Notes (Signed)
Pt has been weak, having a loss of appetite since last Saturday. Pt was seen at her doctor, had URI and finished her zpak yesterday. Pt c/o crying everyday, having loose bowel movements everyday when she goes to urinate some comes out about "the color of my toenail polish" (which is light brown). Pt states that she is unable to go to work bc she cant go sit ather desk feeling this way. Pt also c/o feeling hot, "like the top of my head gets hot".

## 2014-02-17 DIAGNOSIS — E876 Hypokalemia: Secondary | ICD-10-CM | POA: Diagnosis present

## 2014-02-17 DIAGNOSIS — I959 Hypotension, unspecified: Secondary | ICD-10-CM

## 2014-02-17 DIAGNOSIS — J189 Pneumonia, unspecified organism: Secondary | ICD-10-CM

## 2014-02-17 DIAGNOSIS — A419 Sepsis, unspecified organism: Secondary | ICD-10-CM | POA: Diagnosis present

## 2014-02-17 LAB — LEGIONELLA ANTIGEN, URINE: LEGIONELLA ANTIGEN, URINE: NEGATIVE

## 2014-02-17 LAB — BASIC METABOLIC PANEL
BUN: 19 mg/dL (ref 6–23)
CHLORIDE: 101 meq/L (ref 96–112)
CO2: 22 meq/L (ref 19–32)
Calcium: 8 mg/dL — ABNORMAL LOW (ref 8.4–10.5)
Creatinine, Ser: 0.73 mg/dL (ref 0.50–1.10)
GFR calc non Af Amer: >90 mL/min (ref >90)
Glucose, Bld: 84 mg/dL (ref 70–99)
POTASSIUM: 2.9 meq/L — AB (ref 3.7–5.3)
Sodium: 139 mEq/L (ref 137–147)

## 2014-02-17 LAB — CBC
HEMATOCRIT: 36.9 % (ref 36.0–46.0)
HEMOGLOBIN: 12.5 g/dL (ref 12.0–15.0)
MCH: 28.9 pg (ref 26.0–34.0)
MCHC: 33.9 g/dL (ref 30.0–36.0)
MCV: 85.4 fL (ref 78.0–100.0)
Platelets: 202 10*3/uL (ref 150–400)
RBC: 4.32 MIL/uL (ref 3.87–5.11)
RDW: 13 % (ref 11.5–15.5)
WBC: 9.4 10*3/uL (ref 4.0–10.5)

## 2014-02-17 LAB — LACTIC ACID, PLASMA: Lactic Acid, Venous: 2.5 mmol/L — ABNORMAL HIGH (ref 0.5–2.2)

## 2014-02-17 LAB — MAGNESIUM: Magnesium: 1.9 mg/dL (ref 1.5–2.5)

## 2014-02-17 LAB — URINE CULTURE
COLONY COUNT: NO GROWTH
Culture: NO GROWTH

## 2014-02-17 LAB — HIV ANTIBODY (ROUTINE TESTING W REFLEX): HIV 1&2 Ab, 4th Generation: NONREACTIVE

## 2014-02-17 MED ORDER — PNEUMOCOCCAL VAC POLYVALENT 25 MCG/0.5ML IJ INJ
0.5000 mL | INJECTION | INTRAMUSCULAR | Status: AC
  Administered 2014-02-18: 0.5 mL via INTRAMUSCULAR
  Filled 2014-02-17 (×2): qty 0.5

## 2014-02-17 MED ORDER — HYDROXYZINE HCL 50 MG PO TABS
50.0000 mg | ORAL_TABLET | Freq: Once | ORAL | Status: AC
  Administered 2014-02-17: 50 mg via ORAL
  Filled 2014-02-17: qty 1

## 2014-02-17 MED ORDER — BENZONATATE 100 MG PO CAPS
100.0000 mg | ORAL_CAPSULE | Freq: Three times a day (TID) | ORAL | Status: DC | PRN
  Administered 2014-02-17: 100 mg via ORAL
  Filled 2014-02-17: qty 1

## 2014-02-17 MED ORDER — POTASSIUM CHLORIDE CRYS ER 20 MEQ PO TBCR
40.0000 meq | EXTENDED_RELEASE_TABLET | Freq: Once | ORAL | Status: DC
  Filled 2014-02-17 (×2): qty 2

## 2014-02-17 MED ORDER — POTASSIUM CHLORIDE 10 MEQ/100ML IV SOLN
10.0000 meq | INTRAVENOUS | Status: AC
  Administered 2014-02-17 (×3): 10 meq via INTRAVENOUS
  Filled 2014-02-17 (×3): qty 100

## 2014-02-17 NOTE — Progress Notes (Signed)
[facility] PROGRESS NOTE  GILMA BESSETTE UYQ:[id number] DOB: [birth year] DOA: 02/16/2014 PCP: Kristian Covey, MD  Assessment/Plan: 1. Sepsis - Improving with current antibiotic on board. - Blood cultures pending - Will transition to floor given improvement in condition. Will monitor in telemetry for the next 24 hours  2 .PNA - White blood cell count improving - sputum culture pending - Positive Strep Pneumonia urinary antigen  3. Hypokalemia - Received 3 runs of potassium 10 mEq IV - Plan on administering K dur - Magnesium within normal limits.  4. Sepsis associated hypotension - Suspect hypotension was multifactorial but given sepsis and pneumonia most likely infectious etiology played a role as well. Has resolved with improvement in oral intake, IV antibiotics, and IV fluids. - Given resolution of hypotension will transition patient to floor and monitor on telemetry.  Code Status: full Family Communication: Discussed with patient and daughter at bedside Disposition Plan: Transfer to telemetry given improvement in condition.   Consultants:  None  Procedures:  None  Antibiotics:  Levaquin  HPI/Subjective: The patient reports feeling much better currently. States that her appetite is improving.  Objective: Filed Vitals:   02/17/14 0900  BP: 98/46  Pulse: 103  Temp:   Resp: 21    Intake/Output Summary (Last 24 hours) at 02/17/14 1017 Last data filed at 02/17/14 6387  Gross per 24 hour  Intake   3840 ml  Output    572 ml  Net   3268 ml   Filed Weights   02/16/14 1600  Weight: 58.1 kg (128 lb 1.4 oz)    Exam:   General:  Patient in no acute distress, alert and awake  Cardiovascular: Regular rate and rhythm, no murmurs or rubs  Respiratory: Rales over left lung field. No wheezes, breath sounds equal bilaterally  Abdomen: Soft, nondistended, nontender  Musculoskeletal: No cyanosis or clubbing  Data Reviewed: Basic Metabolic  Panel:  Recent Labs Lab 02/16/14 1308 02/17/14 0515  NA 132* 139  K 3.5* 2.9*  CL 88* 101  CO2 24 22  GLUCOSE 158* 84  BUN 27* 19  CREATININE 1.08 0.73  CALCIUM 8.9 8.0*  MG  --  1.9   Liver Function Tests:  Recent Labs Lab 02/16/14 1308  AST 28  ALT 13  ALKPHOS 122*  BILITOT 1.9*  PROT 6.9  ALBUMIN 2.3*   No results found for this basename: LIPASE, AMYLASE,  in the last 168 hours No results found for this basename: AMMONIA,  in the last 168 hours CBC:  Recent Labs Lab 02/16/14 1308 02/17/14 0515  WBC 10.9* 9.4  NEUTROABS 9.9*  --   HGB 13.4 12.5  HCT 38.3 36.9  MCV 84.2 85.4  PLT 213 202   Cardiac Enzymes: No results found for this basename: CKTOTAL, CKMB, CKMBINDEX, TROPONINI,  in the last 168 hours BNP (last 3 results) No results found for this basename: PROBNP,  in the last 8760 hours CBG: No results found for this basename: GLUCAP,  in the last 168 hours  Recent Results (from the past 240 hour(s))  MRSA PCR SCREENING     Status: None   Collection Time    02/16/14  3:39 PM      Result Value Ref Range Status   MRSA by PCR NEGATIVE  NEGATIVE Final   Comment:            The GeneXpert MRSA Assay (FDA     approved for NASAL specimens     only), is one component of  a     comprehensive MRSA colonization     surveillance program. It is not     intended to diagnose MRSA     infection nor to guide or     monitor treatment for     MRSA infections.  CULTURE, EXPECTORATED SPUTUM-ASSESSMENT     Status: None   Collection Time    02/16/14  4:23 PM      Result Value Ref Range Status   Specimen Description SPUTUM   Final   Special Requests NONE   Final   Sputum evaluation     Final   Value: THIS SPECIMEN IS ACCEPTABLE. RESPIRATORY CULTURE REPORT TO FOLLOW.   Report Status 02/16/2014 FINAL   Final     Studies: Dg Chest 2 View  02/16/2014   CLINICAL DATA:  Weakness and cough for 1 week. Congestion. Ex-smoker.  EXAM: CHEST  2 VIEW  COMPARISON:  None.   FINDINGS: The heart size and mediastinal contours are normal. There is dense consolidation inferiorly in the right upper lobe and posteriorly in the left lower lobe. There is no obvious hilar mass or pleural effusion. Telemetry leads overlie the chest.  IMPRESSION: Right upper and left lower lobe consolidation consistent with multi lobar pneumonia. Radiographic follow up recommended to document clearing.   Electronically Signed   By: Roxy Horseman M.D.   On: 02/16/2014 13:24    Scheduled Meds: . enoxaparin (LOVENOX) injection  40 mg Subcutaneous Q24H  . escitalopram  10 mg Oral Daily  . leflunomide  20 mg Oral Daily  . levofloxacin (LEVAQUIN) IV  750 mg Intravenous Q24H  . potassium chloride  10 mEq Intravenous Q1 Hr x 3  . potassium chloride  40 mEq Oral Once   Continuous Infusions: . sodium chloride 125 mL/hr at 02/17/14 0504    Active Problems:   PNA (pneumonia)    Time spent: > 35 minutes    Penny Pia  [facility] Pager [phone]. If 7PM-7AM, please contact night-coverage at [email or web address], password [facility] 02/17/2014, 10:17 AM  LOS: 1 day

## 2014-02-17 NOTE — Progress Notes (Signed)
Agree with previous RN's assessment. Will continue to monitor patient. Setzer, Kenedee Molesky Marie  

## 2014-02-18 DIAGNOSIS — R7881 Bacteremia: Secondary | ICD-10-CM | POA: Diagnosis present

## 2014-02-18 DIAGNOSIS — I369 Nonrheumatic tricuspid valve disorder, unspecified: Secondary | ICD-10-CM

## 2014-02-18 LAB — BASIC METABOLIC PANEL
BUN: 15 mg/dL (ref 6–23)
CALCIUM: 8.1 mg/dL — AB (ref 8.4–10.5)
CO2: 23 mEq/L (ref 19–32)
Chloride: 102 mEq/L (ref 96–112)
Creatinine, Ser: 0.57 mg/dL (ref 0.50–1.10)
GFR calc Af Amer: >90 mL/min (ref >90)
Glucose, Bld: 79 mg/dL (ref 70–99)
Potassium: 3.2 mEq/L — ABNORMAL LOW (ref 3.7–5.3)
SODIUM: 137 meq/L (ref 137–147)

## 2014-02-18 MED ORDER — VANCOMYCIN HCL IN DEXTROSE 750-5 MG/150ML-% IV SOLN
750.0000 mg | Freq: Two times a day (BID) | INTRAVENOUS | Status: DC
  Administered 2014-02-18 – 2014-02-19 (×2): 750 mg via INTRAVENOUS
  Filled 2014-02-18 (×2): qty 150

## 2014-02-18 MED ORDER — ALPRAZOLAM 0.25 MG PO TABS
0.2500 mg | ORAL_TABLET | Freq: Once | ORAL | Status: AC
  Administered 2014-02-18: 0.25 mg via ORAL
  Filled 2014-02-18: qty 1

## 2014-02-18 MED ORDER — VANCOMYCIN HCL IN DEXTROSE 1-5 GM/200ML-% IV SOLN
1000.0000 mg | INTRAVENOUS | Status: AC
  Administered 2014-02-18: 1000 mg via INTRAVENOUS
  Filled 2014-02-18: qty 200

## 2014-02-18 NOTE — Progress Notes (Signed)
ANTIBIOTIC CONSULT NOTE - INITIAL  Pharmacy Consult for Vancomycin Indication: Bacteremia  Allergies  Allergen Reactions  . Bee Venom   . Codeine     "seeing spots"  . Penicillins     REACTION: rash, itiching    Patient Measurements: Height: 5\' 4"  (162.6 cm) Weight: 128 lb 1.4 oz (58.1 kg) IBW/kg (Calculated) : 54.7 Adjusted Body Weight:   Vital Signs: Temp: 97.8 F (36.6 C) (06/15 0556) Temp src: Oral (06/15 0556) BP: 104/61 mmHg (06/15 0556) Pulse Rate: 103 (06/15 0556) Intake/Output from previous day: 06/14 0701 - 06/15 0700 In: 2454.6 [P.O.:720; I.V.:1384.6; IV Piggyback:350] Out: 300 [Urine:300] Intake/Output from this shift: Total I/O In: 240 [P.O.:240] Out: -   Labs:  Recent Labs  02/16/14 1308 02/17/14 0515  WBC 10.9* 9.4  HGB 13.4 12.5  PLT 213 202  CREATININE 1.08 0.73   Estimated Creatinine Clearance: 69.4 ml/min (by C-G formula based on Cr of 0.73). No results found for this basename: VANCOTROUGH, 02/19/14, VANCORANDOM, GENTTROUGH, GENTPEAK, GENTRANDOM, TOBRATROUGH, TOBRAPEAK, TOBRARND, AMIKACINPEAK, AMIKACINTROU, AMIKACIN,  in the last 72 hours   Microbiology: Recent Results (from the past 720 hour(s))  URINE CULTURE     Status: None   Collection Time    02/16/14 12:26 PM      Result Value Ref Range Status   Specimen Description URINE, CLEAN CATCH   Final   Special Requests NONE   Final   Culture  Setup Time     Final   Value: 02/16/2014 18:46     Performed at 02/18/2014 Count     Final   Value: NO GROWTH     Performed at [facility]   Culture     Final   Value: NO GROWTH     Performed at [facility]   Report Status 02/17/2014 FINAL   Final  CULTURE, BLOOD (ROUTINE X 2)     Status: None   Collection Time    02/16/14  2:45 PM      Result Value Ref Range Status   Specimen Description BLOOD LEFT HAND  3 ML IN [facility] BOTTLE   Final   Special Requests NONE   Final   Culture  Setup Time     Final   Value: 02/16/2014 18:20     Performed at 02/18/2014   Culture     Final   Value: GRAM POSITIVE COCCI IN PAIRS     Note: Gram Stain Report Called to,Read Back By and Verified With: [facility] 02/17/14 @ 1:25PM BY RUSCOE A     Performed at 02/19/14   Report Status PENDING   Incomplete  CULTURE, BLOOD (ROUTINE X 2)     Status: None   Collection Time    02/16/14  3:00 PM      Result Value Ref Range Status   Specimen Description BLOOD RIGHT ARM  5 ML IN [facility] BOTTLE   Final   Special Requests NONE   Final   Culture  Setup Time     Final   Value: 02/16/2014 18:20     Performed at 02/18/2014   Culture     Final   Value: GRAM POSITIVE COCCI IN PAIRS     Note: Gram Stain Report Called to,Read Back By and Verified With: [facility] ORAEGB[facility] 02/17/14 @ 1:25PM BY RUSCOE A.     Performed at 02/19/14   Report Status PENDING   Incomplete  MRSA  PCR SCREENING     Status: None   Collection Time    02/16/14  3:39 PM      Result Value Ref Range Status   MRSA by PCR NEGATIVE  NEGATIVE Final   Comment:            The GeneXpert MRSA Assay (FDA     approved for NASAL specimens     only), is one component of a     comprehensive MRSA colonization     surveillance program. It is not     intended to diagnose MRSA     infection nor to guide or     monitor treatment for     MRSA infections.  CULTURE, EXPECTORATED SPUTUM-ASSESSMENT     Status: None   Collection Time    02/16/14  4:23 PM      Result Value Ref Range Status   Specimen Description SPUTUM   Final   Special Requests NONE   Final   Sputum evaluation     Final   Value: THIS SPECIMEN IS ACCEPTABLE. RESPIRATORY CULTURE REPORT TO FOLLOW.   Report Status 02/16/2014 FINAL   Final  CULTURE, RESPIRATORY (NON-EXPECTORATED)     Status: None   Collection Time    02/16/14  4:23 PM      Result Value Ref Range Status   Specimen Description SPUTUM   Final   Special Requests NONE   Final   Gram Stain      Final   Value: MODERATE WCBP FEW SQUAMOUS EPITHELIAL CELLS PRESENT     ABUNDANT GRAM POSITIVE COCCI IN PAIRS     IN CHAINS FEW Y     Performed at [facility]   Culture     Final   Value: MODERATE STREPTOCOCCUS PNEUMONIAE     Performed at [facility]   Report Status PENDING   Incomplete    Medical History: Past Medical History  Diagnosis Date  . Abnormal Pap smear 09/2006    HGSIL CIN 2/VAIN/ CIN 3/VAIN-3 CIS  . RA (rheumatoid arthritis) 07/2009    Assessment: 57 yoF presents 6/13 with sepsis, SOB and weakness associated with productive cough, yellow sputum.  Recently seen by PCP and treated with z-pack with no significant symptom improvement. CXR consistent with PNA,  Sputum growing mod s. Pneumo, and pt started on IV levaquin.  Pt now transferred to floor for improved clinical condition but blood cultures growing GPC in pairs.  Pharmacy consulted to start Vancomycin for bacteremia.    Antibiotic allergies: PCN - rash/itching  6/13 >> Ceftriaxone x 1 >> 6/13 6/13 >>  Levaquin  >> 6/15 >>  Vancomycin   >>    Tmax: Afebrile WBCs: WNL Renal: Improved, SCr 0.73, CrCl 69 (N 91 using rounded SCr 0.8)  6/13 blood: 2/2 GPC in pairs 6/13 urine: NGF 6/13 sputum: Mod strep pneumo  6/13 MRSA PCR negative 6/13 Strep pneumo urine antigen: positive / legionella negative  Goal of Therapy:  Vancomycin trough level 15-20 mcg/ml  Plan:  Vancomycin 1g IV x 1 now, then 750mg  IV q12h F/u renal fxn, cultures, trough at [facility], clinical course  , PharmD, BCPS 02/18/2014, 10:48 AM  Pager: 02/20/2014

## 2014-02-18 NOTE — Progress Notes (Signed)
  Echocardiogram 2D Echocardiogram has been performed.  Kimberly Shelton 02/18/2014, 2:27 PM

## 2014-02-18 NOTE — Progress Notes (Signed)
INITIAL NUTRITION ASSESSMENT  DOCUMENTATION CODES Per approved criteria  -Not Applicable   INTERVENTION: -Recommend [employer] Instant Breakfast once daily (2PM nourishment) -Will continue to monitor  NUTRITION DIAGNOSIS: Inadequate oral intake related to early satiety/nausea as evidenced by PO intake <75% for 7 days, 5 lbs unintentional wt loss.   Goal: Pt to meet >/= 90% of their estimated nutrition needs    Monitor:  Total protein/energy intake, labs, weights  Reason for Assessment: MST  53 y.o. female  Admitting Dx: Sepsis  ASSESSMENT: Pt is 53 yo female who [facility] ED with main concern of several days duration of progressively worsening shortness of breath, associated with productive cough of yellow sputum, malaise, poor oral intake, worse with exertion and occasionally improved with rest.  -Pt reported poor PO intake for past 7 days. Has had feelings of early satiety and nausea; has been eating 25-50% of 2-3 meals/daily. Consumed dry/bland foods to assist in tolerance (cereals, toast, sandwiches) -Endorsed an unintentional wt loss of 5-6 lbs, unable to determine when wt loss began -Appetite slightly improving during admit, able to tolerate 25% of breakfast w/out nausea -Discussed supplement/snack options. Open to trying [employer] for 2PM snack -Hypokalemia-receiving supplementation  Height: Ht Readings from Last 1 Encounters:  02/16/14 5\' 4"  (1.626 m)    Weight: Wt Readings from Last 1 Encounters:  02/16/14 128 lb 1.4 oz (58.1 kg)    Ideal Body Weight: 120 lbs  % Ideal Body Weight: 106%  Wt Readings from Last 10 Encounters:  02/16/14 128 lb 1.4 oz (58.1 kg)  05/04/13 124 lb (56.246 kg)  03/26/09 147 lb (66.679 kg)    Usual Body Weight: 132 lbs per pt  % Usual Body Weight: 97%  BMI:  Body mass index is 21.98 kg/(m^2).  Estimated Nutritional Needs: Kcal: 1500-1700 Protein: 60-75 gram Fluid: >/=1500 ml/daily  Skin:  WDL  Diet Order: General  EDUCATION NEEDS: -No education needs identified at this time   Intake/Output Summary (Last 24 hours) at 02/18/14 1330 Last data filed at 02/18/14 0900  Gross per 24 hour  Intake 1523.75 ml  Output      0 ml  Net 1523.75 ml    Last BM: 6/14   Labs:   Recent Labs Lab 02/16/14 1308 02/17/14 0515 02/18/14 1150  NA 132* 139 137  K 3.5* 2.9* 3.2*  CL 88* 101 102  CO2 24 22 23   BUN 27* 19 15  CREATININE 1.08 0.73 0.57  CALCIUM 8.9 8.0* 8.1*  MG  --  1.9  --   GLUCOSE 158* 84 79    CBG (last 3)  No results found for this basename: GLUCAP,  in the last 72 hours  Scheduled Meds: . enoxaparin (LOVENOX) injection  40 mg Subcutaneous Q24H  . escitalopram  10 mg Oral Daily  . leflunomide  20 mg Oral Daily  . levofloxacin (LEVAQUIN) IV  750 mg Intravenous Q24H  . potassium chloride  40 mEq Oral Once  . vancomycin  750 mg Intravenous Q12H    Continuous Infusions: . sodium chloride 75 mL/hr at 02/18/14 0557    Past Medical History  Diagnosis Date  . Abnormal Pap smear 09/2006    HGSIL CIN 2/VAIN/ CIN 3/VAIN-3 CIS  . RA (rheumatoid arthritis) 07/2009    Past Surgical History  Procedure Laterality Date  . Cervical biopsy  w/ loop electrode excision  2008    CIN 3  . Pilonidal cyst excision  1999    I&D  Atlee Abide MS RD LDN Clinical Dietitian [phone]

## 2014-02-18 NOTE — Progress Notes (Signed)
[facility] PROGRESS NOTE  Kimberly Shelton NWG:[id number] DOB: May 27, 1961 DOA: 02/16/2014 PCP: Kristian Covey, MD  Assessment/Plan: 1. Sepsis - Improving with current antibiotic on board. - Blood cultures positive x 2 and awaiting final report  2. Bacteremia - Add vancomycin while awaiting final blood culture results. Pt with positive strep pneumo urinary antigen I suspect most likely this is causative organism.  Currently on Levaquin but will add Vancomycin until final results resultes. - Obtain Echocardiogram  3 .PNA - White blood cell count improving - sputum culture pending - Positive Strep Pneumonia urinary antigen  4. Hypokalemia - Received 3 runs of potassium 10 mEq IV - Plan on administering K dur - Magnesium within normal limits. - order BMP today to reassess  5. Sepsis associated hypotension - Suspect hypotension was multifactorial but given sepsis and pneumonia most likely infectious etiology played a role as well. Has resolved with improvement in oral intake, IV antibiotics, and IV fluids.  Code Status: full Family Communication: Discussed with patient and daughter at bedside Disposition Plan: Transfer to telemetry given improvement in condition.   Consultants:  None  Procedures:  None  Antibiotics:  Levaquin  HPI/Subjective: The patient reports feeling much better currently. No new complaints reported.  Objective: Filed Vitals:   02/18/14 0556  BP: 104/61  Pulse: 103  Temp: 97.8 F (36.6 C)  Resp:     Intake/Output Summary (Last 24 hours) at 02/18/14 1059 Last data filed at 02/18/14 0900  Gross per 24 hour  Intake 1979.58 ml  Output      0 ml  Net 1979.58 ml   Filed Weights   02/16/14 1600  Weight: 58.1 kg (128 lb 1.4 oz)    Exam:   General:  Patient in no acute distress, alert and awake  Cardiovascular: Regular rate and rhythm, no murmurs or rubs  Respiratory: Rales over left lung field still present. No wheezes,  breath sounds equal bilaterally  Abdomen: Soft, nondistended, nontender  Musculoskeletal: No cyanosis or clubbing  Data Reviewed: Basic Metabolic Panel:  Recent Labs Lab 02/16/14 1308 02/17/14 0515  NA 132* 139  K 3.5* 2.9*  CL 88* 101  CO2 24 22  GLUCOSE 158* 84  BUN 27* 19  CREATININE 1.08 0.73  CALCIUM 8.9 8.0*  MG  --  1.9   Liver Function Tests:  Recent Labs Lab 02/16/14 1308  AST 28  ALT 13  ALKPHOS 122*  BILITOT 1.9*  PROT 6.9  ALBUMIN 2.3*   No results found for this basename: LIPASE, AMYLASE,  in the last 168 hours No results found for this basename: AMMONIA,  in the last 168 hours CBC:  Recent Labs Lab 02/16/14 1308 02/17/14 0515  WBC 10.9* 9.4  NEUTROABS 9.9*  --   HGB 13.4 12.5  HCT 38.3 36.9  MCV 84.2 85.4  PLT 213 202   Cardiac Enzymes: No results found for this basename: CKTOTAL, CKMB, CKMBINDEX, TROPONINI,  in the last 168 hours BNP (last 3 results) No results found for this basename: PROBNP,  in the last 8760 hours CBG: No results found for this basename: GLUCAP,  in the last 168 hours  Recent Results (from the past 240 hour(s))  URINE CULTURE     Status: None   Collection Time    02/16/14 12:26 PM      Result Value Ref Range Status   Specimen Description URINE, CLEAN CATCH   Final   Special Requests NONE   Final   Culture  Setup Time  Final   Value: 02/16/2014 18:46     Performed at [facility] Count     Final   Value: NO GROWTH     Performed at [facility]   Culture     Final   Value: NO GROWTH     Performed at [facility]   Report Status 02/17/2014 FINAL   Final  CULTURE, BLOOD (ROUTINE X 2)     Status: None   Collection Time    02/16/14  2:45 PM      Result Value Ref Range Status   Specimen Description BLOOD LEFT HAND  3 ML IN [facility] BOTTLE   Final   Special Requests NONE   Final   Culture  Setup Time     Final   Value: 02/16/2014 18:20     Performed at [facility]    Culture     Final   Value: GRAM POSITIVE COCCI IN PAIRS     Note: Gram Stain Report Called to,Read Back By and Verified With: Sumner Boast 02/17/14 @ 1:25PM BY RUSCOE A     Performed at [facility]   Report Status PENDING   Incomplete  CULTURE, BLOOD (ROUTINE X 2)     Status: None   Collection Time    02/16/14  3:00 PM      Result Value Ref Range Status   Specimen Description BLOOD RIGHT ARM  5 ML IN [facility] BOTTLE   Final   Special Requests NONE   Final   Culture  Setup Time     Final   Value: 02/16/2014 18:20     Performed at [facility]   Culture     Final   Value: GRAM POSITIVE COCCI IN PAIRS     Note: Gram Stain Report Called to,Read Back By and Verified With: [facility] [place]AE[facility] 02/17/14 @ 1:25PM BY RUSCOE A.     Performed at [facility]   Report Status PENDING   Incomplete  MRSA PCR SCREENING     Status: None   Collection Time    02/16/14  3:39 PM      Result Value Ref Range Status   MRSA by PCR NEGATIVE  NEGATIVE Final   Comment:            The GeneXpert MRSA Assay (FDA     approved for NASAL specimens     only), is one component of a     comprehensive MRSA colonization     surveillance program. It is not     intended to diagnose MRSA     infection nor to guide or     monitor treatment for     MRSA infections.  CULTURE, EXPECTORATED SPUTUM-ASSESSMENT     Status: None   Collection Time    02/16/14  4:23 PM      Result Value Ref Range Status   Specimen Description SPUTUM   Final   Special Requests NONE   Final   Sputum evaluation     Final   Value: THIS SPECIMEN IS ACCEPTABLE. RESPIRATORY CULTURE REPORT TO FOLLOW.   Report Status 02/16/2014 FINAL   Final  CULTURE, RESPIRATORY (NON-EXPECTORATED)     Status: None   Collection Time    02/16/14  4:23 PM      Result Value Ref Range Status   Specimen Description SPUTUM   Final   Special Requests NONE   Final   Gram Stain  Final   Value: MODERATE WCBP FEW SQUAMOUS EPITHELIAL CELLS PRESENT      ABUNDANT GRAM POSITIVE COCCI IN PAIRS     IN CHAINS FEW Y     Performed at [facility]   Culture     Final   Value: MODERATE STREPTOCOCCUS PNEUMONIAE     Performed at [facility]   Report Status PENDING   Incomplete     Studies: Dg Chest 2 View  02/16/2014   CLINICAL DATA:  Weakness and cough for 1 week. Congestion. Ex-smoker.  EXAM: CHEST  2 VIEW  COMPARISON:  None.  FINDINGS: The heart size and mediastinal contours are normal. There is dense consolidation inferiorly in the right upper lobe and posteriorly in the left lower lobe. There is no obvious hilar mass or pleural effusion. Telemetry leads overlie the chest.  IMPRESSION: Right upper and left lower lobe consolidation consistent with multi lobar pneumonia. Radiographic follow up recommended to document clearing.   Electronically Signed   By: Roxy Horseman M.D.   On: 02/16/2014 13:24    Scheduled Meds: . enoxaparin (LOVENOX) injection  40 mg Subcutaneous Q24H  . escitalopram  10 mg Oral Daily  . leflunomide  20 mg Oral Daily  . levofloxacin (LEVAQUIN) IV  750 mg Intravenous Q24H  . potassium chloride  40 mEq Oral Once  . vancomycin  1,000 mg Intravenous NOW  . vancomycin  750 mg Intravenous Q12H   Continuous Infusions: . sodium chloride 75 mL/hr at 02/18/14 0557    Principal Problem:   Sepsis Active Problems:   PNA (pneumonia)   Sepsis associated hypotension   Hypokalemia   Bacteremia    Time spent: > 35 minutes    Penny Pia  [facility] Pager [phone]. If 7PM-7AM, please contact night-coverage at [email or web address], password [facility] 02/18/2014, 10:59 AM  LOS: 2 days

## 2014-02-19 DIAGNOSIS — I5032 Chronic diastolic (congestive) heart failure: Secondary | ICD-10-CM | POA: Diagnosis present

## 2014-02-19 DIAGNOSIS — I5033 Acute on chronic diastolic (congestive) heart failure: Secondary | ICD-10-CM | POA: Diagnosis present

## 2014-02-19 LAB — BASIC METABOLIC PANEL
BUN: 13 mg/dL (ref 6–23)
CALCIUM: 8 mg/dL — AB (ref 8.4–10.5)
CO2: 21 mEq/L (ref 19–32)
Chloride: 98 mEq/L (ref 96–112)
Creatinine, Ser: 0.58 mg/dL (ref 0.50–1.10)
GFR calc Af Amer: >90 mL/min (ref >90)
Glucose, Bld: 78 mg/dL (ref 70–99)
Potassium: 2.8 mEq/L — CL (ref 3.7–5.3)
SODIUM: 133 meq/L — AB (ref 137–147)

## 2014-02-19 LAB — CULTURE, BLOOD (ROUTINE X 2)

## 2014-02-19 LAB — CBC
HCT: 31.5 % — ABNORMAL LOW (ref 36.0–46.0)
Hemoglobin: 10.7 g/dL — ABNORMAL LOW (ref 12.0–15.0)
MCH: 29.2 pg (ref 26.0–34.0)
MCHC: 34 g/dL (ref 30.0–36.0)
MCV: 85.8 fL (ref 78.0–100.0)
Platelets: 199 10*3/uL (ref 150–400)
RBC: 3.67 MIL/uL — ABNORMAL LOW (ref 3.87–5.11)
RDW: 14 % (ref 11.5–15.5)
WBC: 14.6 10*3/uL — ABNORMAL HIGH (ref 4.0–10.5)

## 2014-02-19 LAB — CULTURE, RESPIRATORY

## 2014-02-19 LAB — CULTURE, RESPIRATORY W GRAM STAIN

## 2014-02-19 MED ORDER — POTASSIUM CHLORIDE CRYS ER 20 MEQ PO TBCR
40.0000 meq | EXTENDED_RELEASE_TABLET | Freq: Once | ORAL | Status: AC
  Administered 2014-02-19: 40 meq via ORAL
  Filled 2014-02-19: qty 2

## 2014-02-19 MED ORDER — HYDROXYZINE HCL 50 MG PO TABS
50.0000 mg | ORAL_TABLET | Freq: Four times a day (QID) | ORAL | Status: DC | PRN
  Administered 2014-02-19: 50 mg via ORAL
  Filled 2014-02-19: qty 1

## 2014-02-19 MED ORDER — POTASSIUM CHLORIDE 10 MEQ/100ML IV SOLN
10.0000 meq | INTRAVENOUS | Status: AC
  Administered 2014-02-19 (×3): 10 meq via INTRAVENOUS
  Filled 2014-02-19 (×3): qty 100

## 2014-02-19 MED ORDER — FUROSEMIDE 10 MG/ML IJ SOLN
40.0000 mg | Freq: Once | INTRAMUSCULAR | Status: AC
  Administered 2014-02-19: 40 mg via INTRAVENOUS
  Filled 2014-02-19: qty 4

## 2014-02-19 NOTE — Progress Notes (Signed)
[facility] PROGRESS NOTE  Kimberly Shelton ZSW:[id number] DOB: 29-Mar-1961 DOA: 02/16/2014 PCP: Kristian Covey, MD  Brief Narrative: 53 year old who presented to the ER complaining of shortness of breath. Found to be septic. Source of infection at lungs. Growing strep pneumoniae and blood cultures x2.   Assessment/Plan: 1. Sepsis - Improving with current antibiotic on board. - Blood cultures positive x 2 currently sensitive to Levaquin  2. Bacteremia - Growing strep pneumo. Sensitive to levaquin. Will need 2 weeks of total antibiotic therapy - Echocardiogram negative for vegetations at valves.   3 .PNA - Positive Strep Pneumonia urinary antigen - Blood cx reports sensitive to levaquin.  4. Hypokalemia - Place order for 3 runs of potassium 10 mEq IV - Plan on administering K dur 40 meq po x 1 - Magnesium within normal limits. - order BMP today to reassess  5. Sepsis associated hypotension - Suspect hypotension was multifactorial but given sepsis and pneumonia most likely infectious etiology played a role as well. Has resolved with improvement in oral intake, IV antibiotics, and IV fluids.  Code Status: full Family Communication: Discussed with patient and daughter at bedside Disposition Plan: Telemetry. Once WBC trending down and patient afebrile may change to oral antibiotics and consider d/c   Consultants:  None  Procedures:  None  Antibiotics:  Levaquin  HPI/Subjective: No new complaints. Feels a little stronger today. Still sob with activity  Objective: Filed Vitals:   02/19/14 0628  BP: 120/50  Pulse: 110  Temp: 98.1 F (36.7 C)  Resp: 18    Intake/Output Summary (Last 24 hours) at 02/19/14 1204 Last data filed at 02/19/14 1100  Gross per 24 hour  Intake 3036.25 ml  Output      0 ml  Net 3036.25 ml   Filed Weights   02/16/14 1600  Weight: 58.1 kg (128 lb 1.4 oz)    Exam:   General:  Patient in no acute distress, alert and  awake  Cardiovascular: Regular rate and rhythm, no murmurs or rubs  Respiratory: Rales over left lung field still present. No wheezes, breath sounds equal bilaterally  Abdomen: Soft, nondistended, nontender  Musculoskeletal: No cyanosis or clubbing  Data Reviewed: Basic Metabolic Panel:  Recent Labs Lab 02/16/14 1308 02/17/14 0515 02/18/14 1150 02/19/14 0428  NA 132* 139 137 133*  K 3.5* 2.9* 3.2* 2.8*  CL 88* 101 102 98  CO2 24 22 23 21   GLUCOSE 158* 84 79 78  BUN 27* 19 15 13   CREATININE 1.08 0.73 0.57 0.58  CALCIUM 8.9 8.0* 8.1* 8.0*  MG  --  1.9  --   --    Liver Function Tests:  Recent Labs Lab 02/16/14 1308  AST 28  ALT 13  ALKPHOS 122*  BILITOT 1.9*  PROT 6.9  ALBUMIN 2.3*   No results found for this basename: LIPASE, AMYLASE,  in the last 168 hours No results found for this basename: AMMONIA,  in the last 168 hours CBC:  Recent Labs Lab 02/16/14 1308 02/17/14 0515 02/19/14 0428  WBC 10.9* 9.4 14.6*  NEUTROABS 9.9*  --   --   HGB 13.4 12.5 10.7*  HCT 38.3 36.9 31.5*  MCV 84.2 85.4 85.8  PLT 213 202 199   Cardiac Enzymes: No results found for this basename: CKTOTAL, CKMB, CKMBINDEX, TROPONINI,  in the last 168 hours BNP (last 3 results) No results found for this basename: PROBNP,  in the last 8760 hours CBG: No results found for this basename: GLUCAP,  in  the last 168 hours  Recent Results (from the past 240 hour(s))  URINE CULTURE     Status: None   Collection Time    02/16/14 12:26 PM      Result Value Ref Range Status   Specimen Description URINE, CLEAN CATCH   Final   Special Requests NONE   Final   Culture  Setup Time     Final   Value: 02/16/2014 18:46     Performed at [facility] Count     Final   Value: NO GROWTH     Performed at [facility]   Culture     Final   Value: NO GROWTH     Performed at [facility]   Report Status 02/17/2014 FINAL   Final  CULTURE, BLOOD (ROUTINE X 2)      Status: None   Collection Time    02/16/14  2:45 PM      Result Value Ref Range Status   Specimen Description BLOOD LEFT HAND  3 ML IN [facility] BOTTLE   Final   Special Requests NONE   Final   Culture  Setup Time     Final   Value: 02/16/2014 18:20     Performed at [facility]   Culture     Final   Value: STREPTOCOCCUS PNEUMONIAE     Note: Gram Stain Report Called to,Read Back By and Verified With: Sumner Boast 02/17/14 @ 1:25PM BY RUSCOE A     Performed at [facility]   Report Status 02/19/2014 FINAL   Final   Organism ID, Bacteria STREPTOCOCCUS PNEUMONIAE   Final  CULTURE, BLOOD (ROUTINE X 2)     Status: None   Collection Time    02/16/14  3:00 PM      Result Value Ref Range Status   Specimen Description BLOOD RIGHT ARM  5 ML IN [facility] BOTTLE   Final   Special Requests NONE   Final   Culture  Setup Time     Final   Value: 02/16/2014 18:20     Performed at [facility]   Culture     Final   Value: STREPTOCOCCUS PNEUMONIAE     Note: SUSCEPTIBILITIES PERFORMED ON PREVIOUS CULTURE WITHIN THE LAST 5 DAYS.     Note: Gram Stain Report Called to,Read Back By and Verified With: Sumner Boast 02/17/14 @ 1:25PM BY RUSCOE A.     Performed at [facility]   Report Status 02/19/2014 FINAL   Final  MRSA PCR SCREENING     Status: None   Collection Time    02/16/14  3:39 PM      Result Value Ref Range Status   MRSA by PCR NEGATIVE  NEGATIVE Final   Comment:            The GeneXpert MRSA Assay (FDA     approved for NASAL specimens     only), is one component of a     comprehensive MRSA colonization     surveillance program. It is not     intended to diagnose MRSA     infection nor to guide or     monitor treatment for     MRSA infections.  CULTURE, EXPECTORATED SPUTUM-ASSESSMENT     Status: None   Collection Time    02/16/14  4:23 PM      Result Value Ref Range Status   Specimen Description SPUTUM   Final  Special Requests NONE   Final   Sputum  evaluation     Final   Value: THIS SPECIMEN IS ACCEPTABLE. RESPIRATORY CULTURE REPORT TO FOLLOW.   Report Status 02/16/2014 FINAL   Final  CULTURE, RESPIRATORY (NON-EXPECTORATED)     Status: None   Collection Time    02/16/14  4:23 PM      Result Value Ref Range Status   Specimen Description SPUTUM   Final   Special Requests NONE   Final   Gram Stain     Final   Value: MODERATE WBC PRESENT, PREDOMINANTLY PMN     FEW SQUAMOUS EPITHELIAL CELLS PRESENT     ABUNDANT GRAM POSITIVE COCCI IN PAIRS     IN CHAINS FEW Y     Performed at [facility]   Culture     Final   Value: MODERATE STREPTOCOCCUS PNEUMONIAE     Performed at [facility]   Report Status 02/19/2014 FINAL   Final   Organism ID, Bacteria STREPTOCOCCUS PNEUMONIAE   Final     Studies: No results found.  Scheduled Meds: . enoxaparin (LOVENOX) injection  40 mg Subcutaneous Q24H  . escitalopram  10 mg Oral Daily  . furosemide  40 mg Intravenous Once  . leflunomide  20 mg Oral Daily  . levofloxacin (LEVAQUIN) IV  750 mg Intravenous Q24H  . potassium chloride  10 mEq Intravenous Q1 Hr x 3  . potassium chloride  40 mEq Oral Once   Continuous Infusions:    Principal Problem:   Sepsis Active Problems:   PNA (pneumonia)   Sepsis associated hypotension   Hypokalemia   Bacteremia   Chronic diastolic heart failure    Time spent: > 35 minutes    Penny Pia  [facility] Pager [phone]. If 7PM-7AM, please contact night-coverage at [email or web address], password [facility] 02/19/2014, 12:04 PM  LOS: 3 days

## 2014-02-20 ENCOUNTER — Inpatient Hospital Stay (HOSPITAL_COMMUNITY): Payer: Managed Care, Other (non HMO)

## 2014-02-20 DIAGNOSIS — J13 Pneumonia due to Streptococcus pneumoniae: Secondary | ICD-10-CM

## 2014-02-20 DIAGNOSIS — A419 Sepsis, unspecified organism: Secondary | ICD-10-CM

## 2014-02-20 DIAGNOSIS — I5032 Chronic diastolic (congestive) heart failure: Secondary | ICD-10-CM

## 2014-02-20 DIAGNOSIS — S27309A Unspecified injury of lung, unspecified, initial encounter: Secondary | ICD-10-CM

## 2014-02-20 DIAGNOSIS — R7881 Bacteremia: Secondary | ICD-10-CM

## 2014-02-20 DIAGNOSIS — B953 Streptococcus pneumoniae as the cause of diseases classified elsewhere: Secondary | ICD-10-CM

## 2014-02-20 LAB — CBC
HCT: 32.9 % — ABNORMAL LOW (ref 36.0–46.0)
Hemoglobin: 10.9 g/dL — ABNORMAL LOW (ref 12.0–15.0)
MCH: 28.8 pg (ref 26.0–34.0)
MCHC: 33.1 g/dL (ref 30.0–36.0)
MCV: 86.8 fL (ref 78.0–100.0)
PLATELETS: 224 10*3/uL (ref 150–400)
RBC: 3.79 MIL/uL — AB (ref 3.87–5.11)
RDW: 14.2 % (ref 11.5–15.5)
WBC: 12.2 10*3/uL — ABNORMAL HIGH (ref 4.0–10.5)

## 2014-02-20 LAB — BASIC METABOLIC PANEL
BUN: 14 mg/dL (ref 6–23)
CALCIUM: 8.1 mg/dL — AB (ref 8.4–10.5)
CO2: 26 meq/L (ref 19–32)
Chloride: 97 mEq/L (ref 96–112)
Creatinine, Ser: 0.7 mg/dL (ref 0.50–1.10)
GFR calc Af Amer: >90 mL/min (ref >90)
GFR calc non Af Amer: >90 mL/min (ref >90)
Glucose, Bld: 90 mg/dL (ref 70–99)
Potassium: 3.6 mEq/L — ABNORMAL LOW (ref 3.7–5.3)
Sodium: 135 mEq/L — ABNORMAL LOW (ref 137–147)

## 2014-02-20 LAB — PROCALCITONIN: PROCALCITONIN: 1.19 ng/mL

## 2014-02-20 LAB — PRO B NATRIURETIC PEPTIDE: Pro B Natriuretic peptide (BNP): 2073 pg/mL — ABNORMAL HIGH (ref 0–125)

## 2014-02-20 MED ORDER — FUROSEMIDE 10 MG/ML IJ SOLN
40.0000 mg | Freq: Two times a day (BID) | INTRAMUSCULAR | Status: DC
  Administered 2014-02-20 – 2014-02-23 (×6): 40 mg via INTRAVENOUS
  Filled 2014-02-20 (×8): qty 4

## 2014-02-20 MED ORDER — IPRATROPIUM-ALBUTEROL 0.5-2.5 (3) MG/3ML IN SOLN
3.0000 mL | Freq: Four times a day (QID) | RESPIRATORY_TRACT | Status: DC
  Administered 2014-02-20 – 2014-02-22 (×7): 3 mL via RESPIRATORY_TRACT
  Filled 2014-02-20 (×10): qty 3

## 2014-02-20 MED ORDER — VITAMINS A & D EX OINT
TOPICAL_OINTMENT | CUTANEOUS | Status: AC
  Administered 2014-02-20: 13:00:00
  Filled 2014-02-20: qty 5

## 2014-02-20 MED ORDER — FUROSEMIDE 10 MG/ML IJ SOLN
20.0000 mg | Freq: Two times a day (BID) | INTRAMUSCULAR | Status: DC
  Administered 2014-02-20: 20 mg via INTRAVENOUS
  Filled 2014-02-20: qty 2

## 2014-02-20 MED ORDER — DM-GUAIFENESIN ER 30-600 MG PO TB12
1.0000 | ORAL_TABLET | Freq: Two times a day (BID) | ORAL | Status: DC
  Administered 2014-02-20 – 2014-02-24 (×9): 1 via ORAL
  Filled 2014-02-20 (×10): qty 1

## 2014-02-20 MED ORDER — POTASSIUM CHLORIDE CRYS ER 20 MEQ PO TBCR
40.0000 meq | EXTENDED_RELEASE_TABLET | Freq: Two times a day (BID) | ORAL | Status: DC
  Administered 2014-02-20 – 2014-02-22 (×4): 40 meq via ORAL
  Filled 2014-02-20 (×6): qty 2

## 2014-02-20 MED ORDER — FLUCONAZOLE 100 MG PO TABS
100.0000 mg | ORAL_TABLET | Freq: Every day | ORAL | Status: DC
  Administered 2014-02-21 – 2014-02-24 (×4): 100 mg via ORAL
  Filled 2014-02-20 (×4): qty 1

## 2014-02-20 MED ORDER — FLUCONAZOLE 200 MG PO TABS
200.0000 mg | ORAL_TABLET | Freq: Once | ORAL | Status: AC
  Administered 2014-02-20: 200 mg via ORAL
  Filled 2014-02-20: qty 1

## 2014-02-20 NOTE — Consult Note (Signed)
Name: Kimberly Shelton MRN: [id number] DOB: 09/23/1960    ADMISSION DATE:  02/16/2014 CONSULTATION DATE:  6/17 REFERRING MD :  Sharl Ma PRIMARY SERVICE:  [facility]   CHIEF COMPLAINT:  Abnormal CXR   BRIEF PATIENT DESCRIPTION:  53 year old female w/ h/o RA (on ARAVA). Admitted to [facility] 6/13. Eventual dx of Bilateral strep PNA  And bacteremia. Treated w/ appropriate abx.  WBCs were trending down, bp normalized. On 6/16 noted to have increased SOB and LE swelling. PCXR showed worsening bilateral airspace disease. PCCM asked to evaluate.    SIGNIFICANT EVENTS / STUDIES:    LINES / TUBES:   CULTURES: Sputum 6/13: strep pneumoniae  Blood 6/13: strep pneumoniae   ANTIBIOTICS: leavaquin 6/13>>> vanc 6/13>>>6/16  HISTORY OF PRESENT ILLNESS:    53 year old female w/ h/o RA (on ARAVA). Admitted to [facility] 6/13 w cc: several day h/o productive cough, w/ yellow sputum, malaise, decreased oral intake and worsening shortness of breath. Had been seen by PCP 5 days prior by PCP and rx'd qw/ zpak. No improvement. CXR on presentation: RUL and LLL PNA. Admitted w/ working dx of CAP. Treated w/  Levaquin and IVFs as had boarder line hypotension. Sputum cultures positive for moderate strep pneumonia as were blood cultures. ECHO was obtained 6/15 to r/o endocarditis. EF 65-70%, doppler finding c/w grade 1 diastolic dysfxn. All valves normal. WBCs were trending down. On 6/16 noted to have increased SOB and LE swelling. PCXR showed worsening bilateral airspace disease. PCCM asked to evaluate.    PAST MEDICAL HISTORY :  Past Medical History  Diagnosis Date  . Abnormal Pap smear 09/2006    HGSIL CIN 2/VAIN/ CIN 3/VAIN-3 CIS  . RA (rheumatoid arthritis) 07/2009   Past Surgical History  Procedure Laterality Date  . Cervical biopsy  w/ loop electrode excision  2008    CIN 3  . Pilonidal cyst excision  1999    I&D   Prior to Admission medications   Medication Sig Start Date End Date Taking? Authorizing Provider    escitalopram (LEXAPRO) 10 MG tablet Take 1 tablet (10 mg total) by mouth daily. 05/09/13  Yes Annamaria Boots, MD  leflunomide (ARAVA) 20 MG tablet Take 20 mg by mouth daily.  04/28/13  Yes Historical Provider, MD  promethazine (PHENERGAN) 12.5 MG tablet Take 12.5 mg by mouth every 6 (six) hours as needed for nausea or vomiting (nausea and vomiting).   Yes Historical Provider, MD  SIMPONI 50 MG/0.5ML SOLN every 30 (thirty) days.  04/10/13   Historical Provider, MD   Allergies  Allergen Reactions  . Bee Venom   . Codeine     "seeing spots"  . Penicillins     REACTION: rash, itiching    FAMILY HISTORY:  Family History  Problem Relation Age of Onset  . Diabetes Mother   . Rheum arthritis Mother   . Diabetes Father   . COPD Father   . Bronchitis Father   . Cancer Brother    SOCIAL HISTORY:  reports that she has been smoking Cigarettes.  She has been smoking about 1.00 pack per day. She has never used smokeless tobacco. She reports that she drinks about .5 ounces of alcohol per week. She reports that she does not use illicit drugs.  REVIEW OF SYSTEMS bold pos:   Constitutional: Negative for fever, chills, weight loss, malaise/fatigue and diaphoresis.  HENT: Negative for hearing loss, ear pain, nosebleeds, congestion, sore throat, neck pain, tinnitus and ear discharge.  Eyes: Negative for blurred vision, double vision, photophobia, pain, discharge and redness.  Respiratory: Negative for cough, improving, hemoptysis, sputum production, yellow, shortness of breath, w/ exertion  wheezing and stridor.   Cardiovascular: Negative for chest pain, palpitations, orthopnea, claudication, leg swelling and PND.  Gastrointestinal: Negative for heartburn, nausea, vomiting, abdominal pain, diarrhea, constipation, blood in stool and melena.  Genitourinary: Negative for dysuria, urgency, frequency, hematuria and flank pain.  Musculoskeletal: Negative for myalgias, back pain, joint pain and falls.   Skin: Negative for itching and rash.  Neurological: Negative for dizziness, tingling, tremors, sensory change, speech change, focal weakness, seizures, loss of consciousness, weakness and headaches.  Endo/Heme/Allergies: Negative for environmental allergies and polydipsia. Does not bruise/bleed easily.  SUBJECTIVE:  Feels a little better  VITAL SIGNS: Temp:  [97.4 F (36.3 C)-98.5 F (36.9 C)] 97.6 F (36.4 C) (06/17 1300) Pulse Rate:  [97-120] 97 (06/17 1300) Resp:  [20-22] 21 (06/17 1300) BP: (119-131)/(52-71) 119/52 mmHg (06/17 1300) SpO2:  [90 %-94 %] 94 % (06/17 1300) 1 liter  PHYSICAL EXAMINATION: General:  No acute distress Neuro:  Awake, alert, no focal def  HEENT:  [place], no JVD, tongue w/ raised red areas, posterior pharynx w/ blotchy erythremic areas    Cardiovascular:  rrr Lungs:  Diffuse rales, scattered rhonchi  Abdomen:  Soft, non tender  Musculoskeletal:  Intact  Skin:  LE edema   Recent Labs Lab 02/18/14 1150 02/19/14 0428 02/20/14 0345  NA 137 133* 135*  K 3.2* 2.8* 3.6*  CL 102 98 97  CO2 23 21 26   BUN 15 13 14   CREATININE 0.57 0.58 0.70  GLUCOSE 79 78 90    Recent Labs Lab 02/17/14 0515 02/19/14 0428 02/20/14 0345  HGB 12.5 10.7* 10.9*  HCT 36.9 31.5* 32.9*  WBC 9.4 14.6* 12.2*  PLT 202 199 224   Dg Chest 2 View  02/20/2014   CLINICAL DATA:  Pneumonia, cough and fever.  EXAM: CHEST  2 VIEW  COMPARISON:  February 16, 2014.  FINDINGS: Cardiomediastinal silhouette appears normal. No pneumothorax or pleural effusion is noted. Stable right upper lobe airspace opacity is noted consistent with pneumonia. Left lower lobe opacity appears slightly enlarged concerning for pneumonia. Bony thorax is intact.  IMPRESSION: Stable right upper lobe pneumonia. Increased left lower lobe airspace opacity is noted concerning for worsening pneumonia.   Electronically Signed   By: 02/22/2014 M.D.   On: 02/20/2014 14:06  bilateral airspace disease. A little worse when c/w  prior film   ASSESSMENT / PLAN: Pneumococcal Pneumonia Pneumococcal bacteremia  Grade I diastolic dysfxn Pulmonary edema RA on immunosuppression  Resolved sepsis Oral candidiasis   Discussion Think that she is actually improving. The CXR likely represents natural progression of her PNA and element of volume excess from resuscitation efforts. Natural course would be to expect her to normalize clinically w/ CXR lagging behind and may take weeks to normalize. This is not due to her RA.   Recs  -agree w/ current abx plan  -have escalated diuresis  -ck PCT  -hold Arava until abx course complete  -PRN nebs  -f/u CXR in am 6/18.   -then will need f/u CXR in couple weeks after d/c.  -wean FIO2 as able   -diflucan   Case discussed w/ Dr 02/22/2014. Formal attending f/u in am 6/18.    Pulmonary and Critical Care Medicine [facility] Pager: [phone]  02/20/2014, 3:15 PM

## 2014-02-20 NOTE — Progress Notes (Signed)
[facility] PROGRESS NOTE  Kimberly Shelton [id number]:[id number] DOB: [birth year] DOA: 02/16/2014 PCP: Kristian Covey, MD  Brief Narrative: 53 yo female who presented to [facility] ED with main concern of several days duration of progressively worsening shortness of breath, associated with productive cough of yellow sputum, malaise, poor oral intake, worse with exertion and occasionally improved with rest. PT explains she was seen by her PCP 5 days PTA and was treated with Z-Pack with no significant improvement in her symptoms. She denies similar events in the past, no known recent sick contacts or exposures.  In ED, pt noted to have SBP in 80-90's but said she typically has low BP in 90's/50's at home. Pt also note to have HR in 110 - 120's with RR in high 20's. CXR c/w PNA and TRH asked to admit to SDU for further evlauation Found to be septic. Source of infection at lungs. Growing strep pneumoniae and blood cultures x2.   Assessment/Plan:  1. Sepsis - Resolved,  - Improving with current antibiotic on board. - Blood cultures positive x 2 currently sensitive to Levaquin  2. Bacteremia - Growing strep pneumo. Sensitive to levaquin. Will need 2 weeks of total antibiotic therapy - Echocardiogram negative for vegetations at valves.   3 .PNA - Positive Strep Pneumonia urinary antigen - Blood cx reports sensitive to levaquin.  4. Hypokalemia - Replaced, potassium is normal today.   5 Pulmonary edema - Patient is positive fluid overload, has elevated BNP 2073. Chest x-ray showing fluid overload. Lasix 20 mg IV every 12 hours started.  6. Rheumatoid arthritis - Will hold Arava till the antibiotic course completed.  Code Status: full Family Communication: Discussed with patient and husband at bedside Disposition Plan: Telemetry. Once WBC trending down and patient afebrile may change to oral antibiotics and consider  d/c   Consultants:  None  Procedures:  None  Antibiotics:  Levaquin  HPI/Subjective: Patient is breathing better after the diuresis, though she feels weak.  Objective: Filed Vitals:   02/20/14 1300  BP: 119/52  Pulse: 97  Temp: 97.6 F (36.4 C)  Resp: 21    Intake/Output Summary (Last 24 hours) at 02/20/14 1631 Last data filed at 02/20/14 1610  Gross per 24 hour  Intake    420 ml  Output   1450 ml  Net  -1030 ml   Filed Weights   02/16/14 1600  Weight: 58.1 kg (128 lb 1.4 oz)    Exam:  Physical Exam: Head: Normocephalic, atraumatic.  Lungs: Normal respiratory effort. B/L rhonchi Heart: Regular RR. S1 and S2 normal  Abdomen: BS normoactive. Soft, Nondistended, non-tender.  Extremities: No pretibial edema, no erythema  Data Reviewed: Basic Metabolic Panel:  Recent Labs Lab 02/16/14 1308 02/17/14 0515 02/18/14 1150 02/19/14 0428 02/20/14 0345  NA 132* 139 137 133* 135*  K 3.5* 2.9* 3.2* 2.8* 3.6*  CL 88* 101 102 98 97  CO2 24 22 23 21 26   GLUCOSE 158* 84 79 78 90  BUN 27* 19 15 13 14   CREATININE 1.08 0.73 0.57 0.58 0.70  CALCIUM 8.9 8.0* 8.1* 8.0* 8.1*  MG  --  1.9  --   --   --    Liver Function Tests:  Recent Labs Lab 02/16/14 1308  AST 28  ALT 13  ALKPHOS 122*  BILITOT 1.9*  PROT 6.9  ALBUMIN 2.3*   No results found for this basename: LIPASE, AMYLASE,  in the last 168 hours No results found for this basename: AMMONIA,  in  the last 168 hours CBC:  Recent Labs Lab 02/16/14 1308 02/17/14 0515 02/19/14 0428 02/20/14 0345  WBC 10.9* 9.4 14.6* 12.2*  NEUTROABS 9.9*  --   --   --   HGB 13.4 12.5 10.7* 10.9*  HCT 38.3 36.9 31.5* 32.9*  MCV 84.2 85.4 85.8 86.8  PLT 213 202 199 224   Cardiac Enzymes: No results found for this basename: CKTOTAL, CKMB, CKMBINDEX, TROPONINI,  in the last 168 hours BNP (last 3 results)  Recent Labs  02/20/14 0350  PROBNP 2073.0*   CBG: No results found for this basename: GLUCAP,  in the  last 168 hours  Recent Results (from the past 240 hour(s))  URINE CULTURE     Status: None   Collection Time    02/16/14 12:26 PM      Result Value Ref Range Status   Specimen Description URINE, CLEAN CATCH   Final   Special Requests NONE   Final   Culture  Setup Time     Final   Value: 02/16/2014 18:46     Performed at [facility] Count     Final   Value: NO GROWTH     Performed at [facility]   Culture     Final   Value: NO GROWTH     Performed at [facility]   Report Status 02/17/2014 FINAL   Final  CULTURE, BLOOD (ROUTINE X 2)     Status: None   Collection Time    02/16/14  2:45 PM      Result Value Ref Range Status   Specimen Description BLOOD LEFT HAND  3 ML IN [facility] BOTTLE   Final   Special Requests NONE   Final   Culture  Setup Time     Final   Value: 02/16/2014 18:20     Performed at [facility]   Culture     Final   Value: STREPTOCOCCUS PNEUMONIAE     Note: Gram Stain Report Called to,Read Back By and Verified With: Sumner Boast 02/17/14 @ 1:25PM BY RUSCOE A     Performed at [facility]   Report Status 02/19/2014 FINAL   Final   Organism ID, Bacteria STREPTOCOCCUS PNEUMONIAE   Final  CULTURE, BLOOD (ROUTINE X 2)     Status: None   Collection Time    02/16/14  3:00 PM      Result Value Ref Range Status   Specimen Description BLOOD RIGHT ARM  5 ML IN [facility] BOTTLE   Final   Special Requests NONE   Final   Culture  Setup Time     Final   Value: 02/16/2014 18:20     Performed at [facility]   Culture     Final   Value: STREPTOCOCCUS PNEUMONIAE     Note: SUSCEPTIBILITIES PERFORMED ON PREVIOUS CULTURE WITHIN THE LAST 5 DAYS.     Note: Gram Stain Report Called to,Read Back By and Verified With: Sumner Boast 02/17/14 @ 1:25PM BY RUSCOE A.     Performed at [facility]   Report Status 02/19/2014 FINAL   Final  MRSA PCR SCREENING     Status: None   Collection Time    02/16/14  3:39 PM       Result Value Ref Range Status   MRSA by PCR NEGATIVE  NEGATIVE Final   Comment:            The GeneXpert MRSA Assay (FDA  approved for NASAL specimens     only), is one component of a     comprehensive MRSA colonization     surveillance program. It is not     intended to diagnose MRSA     infection nor to guide or     monitor treatment for     MRSA infections.  CULTURE, EXPECTORATED SPUTUM-ASSESSMENT     Status: None   Collection Time    02/16/14  4:23 PM      Result Value Ref Range Status   Specimen Description SPUTUM   Final   Special Requests NONE   Final   Sputum evaluation     Final   Value: THIS SPECIMEN IS ACCEPTABLE. RESPIRATORY CULTURE REPORT TO FOLLOW.   Report Status 02/16/2014 FINAL   Final  CULTURE, RESPIRATORY (NON-EXPECTORATED)     Status: None   Collection Time    02/16/14  4:23 PM      Result Value Ref Range Status   Specimen Description SPUTUM   Final   Special Requests NONE   Final   Gram Stain     Final   Value: MODERATE WBC PRESENT, PREDOMINANTLY PMN     FEW SQUAMOUS EPITHELIAL CELLS PRESENT     ABUNDANT GRAM POSITIVE COCCI IN PAIRS     IN CHAINS FEW Y     Performed at [facility]   Culture     Final   Value: MODERATE STREPTOCOCCUS PNEUMONIAE     Performed at [facility]   Report Status 02/19/2014 FINAL   Final   Organism ID, Bacteria STREPTOCOCCUS PNEUMONIAE   Final     Studies: Dg Chest 2 View  02/20/2014   CLINICAL DATA:  Pneumonia, cough and fever.  EXAM: CHEST  2 VIEW  COMPARISON:  February 16, 2014.  FINDINGS: Cardiomediastinal silhouette appears normal. No pneumothorax or pleural effusion is noted. Stable right upper lobe airspace opacity is noted consistent with pneumonia. Left lower lobe opacity appears slightly enlarged concerning for pneumonia. Bony thorax is intact.  IMPRESSION: Stable right upper lobe pneumonia. Increased left lower lobe airspace opacity is noted concerning for worsening pneumonia.   Electronically Signed    By: Roque Lias M.D.   On: 02/20/2014 14:06    Scheduled Meds: . dextromethorphan-guaiFENesin  1 tablet Oral BID  . enoxaparin (LOVENOX) injection  40 mg Subcutaneous Q24H  . escitalopram  10 mg Oral Daily  . fluconazole  200 mg Oral Once   Followed by  . [START ON 02/21/2014] fluconazole  100 mg Oral Daily  . furosemide  40 mg Intravenous Q12H  . ipratropium-albuterol  3 mL Nebulization Q6H  . levofloxacin (LEVAQUIN) IV  750 mg Intravenous Q24H  . potassium chloride  40 mEq Oral Q12H   Continuous Infusions:    Principal Problem:   Sepsis Active Problems:   PNA (pneumonia)   Sepsis associated hypotension   Hypokalemia   Bacteremia   Chronic diastolic heart failure    Time spent: 35 minutes    [facility]  [facility] Pager [phone] If 7PM-7AM, please contact night-coverage at [email or web address], password [facility] 02/20/2014, 4:31 PM  LOS: 4 days

## 2014-02-21 ENCOUNTER — Inpatient Hospital Stay (HOSPITAL_COMMUNITY): Payer: Managed Care, Other (non HMO)

## 2014-02-21 DIAGNOSIS — J13 Pneumonia due to Streptococcus pneumoniae: Secondary | ICD-10-CM

## 2014-02-21 DIAGNOSIS — S27309A Unspecified injury of lung, unspecified, initial encounter: Secondary | ICD-10-CM

## 2014-02-21 DIAGNOSIS — R7881 Bacteremia: Secondary | ICD-10-CM

## 2014-02-21 LAB — CBC
HEMATOCRIT: 30.5 % — AB (ref 36.0–46.0)
Hemoglobin: 10.3 g/dL — ABNORMAL LOW (ref 12.0–15.0)
MCH: 29.2 pg (ref 26.0–34.0)
MCHC: 33.8 g/dL (ref 30.0–36.0)
MCV: 86.4 fL (ref 78.0–100.0)
Platelets: 228 10*3/uL (ref 150–400)
RBC: 3.53 MIL/uL — ABNORMAL LOW (ref 3.87–5.11)
RDW: 14.2 % (ref 11.5–15.5)
WBC: 10.5 10*3/uL (ref 4.0–10.5)

## 2014-02-21 LAB — BASIC METABOLIC PANEL
BUN: 14 mg/dL (ref 6–23)
CHLORIDE: 95 meq/L — AB (ref 96–112)
CO2: 32 mEq/L (ref 19–32)
Calcium: 7.9 mg/dL — ABNORMAL LOW (ref 8.4–10.5)
Creatinine, Ser: 0.68 mg/dL (ref 0.50–1.10)
GFR calc Af Amer: >90 mL/min (ref >90)
GFR calc non Af Amer: >90 mL/min (ref >90)
Glucose, Bld: 87 mg/dL (ref 70–99)
Potassium: 3 mEq/L — ABNORMAL LOW (ref 3.7–5.3)
Sodium: 138 mEq/L (ref 137–147)

## 2014-02-21 MED ORDER — DEXTROSE 5 % IV SOLN
1.0000 g | Freq: Every day | INTRAVENOUS | Status: AC
  Administered 2014-02-21 – 2014-02-22 (×2): 1 g via INTRAVENOUS
  Filled 2014-02-21 (×2): qty 10

## 2014-02-21 MED ORDER — LEVOFLOXACIN IN D5W 750 MG/150ML IV SOLN
750.0000 mg | INTRAVENOUS | Status: AC
  Administered 2014-02-21 – 2014-02-22 (×2): 750 mg via INTRAVENOUS
  Filled 2014-02-21 (×2): qty 150

## 2014-02-21 NOTE — Consult Note (Signed)
See my consult note 02/21/14 when I saw and did consult  Dr. Kalman Shan, M.D., [facility].C.P Pulmonary and Critical Care Medicine Staff Physician [facility] [place] [facility] Pager: [phone], If no answer or between  15:00h - 7:00h: call [phone]  02/21/2014 10:01 AM

## 2014-02-21 NOTE — Consult Note (Signed)
Name: Kimberly Shelton MRN: [id number] DOB: Mar 05, 1961    ADMISSION DATE:  02/16/2014 CONSULTATION DATE:  6/17 REFERRING MD :  Sharl Ma PRIMARY SERVICE:  [facility]   CHIEF COMPLAINT:  Abnormal CXR   BRIEF PATIENT DESCRIPTION:  53 year old female w/ h/o RA (on ARAVA). Admitted to [facility] 6/13. Eventual dx of Bilateral strep PNA  And bacteremia. Treated w/ appropriate abx.  WBCs were trending down, bp normalized. On 6/16 noted to have increased SOB and LE swelling. PCXR showed worsening bilateral airspace disease. PCCM asked to evaluate.    SIGNIFICANT EVENTS / STUDIES:    LINES / TUBES:   CULTURES: Sputum 6/13: strep pneumoniae  Blood 6/13: strep pneumoniae   ANTIBIOTICS: leavaquin 6/13>>> vanc 6/13>>>6/16 Ceftriaxone 6/13 >>6/13 Azithro 6/13 >6/13  Anti-infectives   Start     Dose/Rate Route Frequency Ordered Stop   02/21/14 1000  fluconazole (DIFLUCAN) tablet 100 mg     100 mg Oral Daily 02/20/14 1622     02/20/14 1700  fluconazole (DIFLUCAN) tablet 200 mg     200 mg Oral  Once 02/20/14 1622 02/20/14 1735   02/18/14 2200  vancomycin (VANCOCIN) IVPB 750 mg/150 ml premix  Status:  Discontinued     750 mg 150 mL/hr over 60 Minutes Intravenous Every 12 hours 02/18/14 1047 02/19/14 1056   02/18/14 1100  vancomycin (VANCOCIN) IVPB 1000 mg/200 mL premix     1,000 mg 200 mL/hr over 60 Minutes Intravenous NOW 02/18/14 1047 02/18/14 1316   02/16/14 1800  levofloxacin (LEVAQUIN) IVPB 750 mg     750 mg 100 mL/hr over 90 Minutes Intravenous Every 24 hours 02/16/14 1606 02/20/14 1909   02/16/14 1430  azithromycin (ZITHROMAX) 500 mg in dextrose 5 % 250 mL IVPB  Status:  Discontinued     500 mg 250 mL/hr over 60 Minutes Intravenous Every 24 hours 02/16/14 1425 02/16/14 1606   02/16/14 1430  cefTRIAXone (ROCEPHIN) 1 g in dextrose 5 % 50 mL IVPB  Status:  Discontinued     1 g 100 mL/hr over 30 Minutes Intravenous Every 24 hours 02/16/14 1425 02/16/14 1606       HISTORY OF PRESENT ILLNESS:     53 year old female w/ h/o RA (on ARAVA). Admitted to [facility] 6/13 w cc: several day h/o productive cough, w/ yellow sputum, malaise, decreased oral intake and worsening shortness of breath. Had been seen by PCP 5 days prior by PCP and rx'd qw/ zpak. No improvement. CXR on presentation: RUL and LLL PNA. Admitted w/ working dx of CAP. Treated w/  Levaquin and IVFs as had boarder line hypotension. Sputum cultures positive for moderate strep pneumonia as were blood cultures. ECHO was obtained 6/15 to r/o endocarditis. EF 65-70%, doppler finding c/w grade 1 diastolic dysfxn. All valves normal. WBCs were trending down. On 6/16 noted to have increased SOB and LE swelling. PCXR showed worsening bilateral airspace disease. PCCM asked to evaluate 02/20/14 and seen by NP. Seen by MD consult 02/21/14 - patient feeling better 02/21/14 but with worsening CXR   PAST MEDICAL HISTORY :  Past Medical History  Diagnosis Date  . Abnormal Pap smear 09/2006    HGSIL CIN 2/VAIN/ CIN 3/VAIN-3 CIS  . RA (rheumatoid arthritis) 07/2009   Past Surgical History  Procedure Laterality Date  . Cervical biopsy  w/ loop electrode excision  2008    CIN 3  . Pilonidal cyst excision  1999    I&D   Prior to Admission medications  Medication Sig Start Date End Date Taking? Authorizing Provider  escitalopram (LEXAPRO) 10 MG tablet Take 1 tablet (10 mg total) by mouth daily. 05/09/13  Yes Annamaria Boots, MD  leflunomide (ARAVA) 20 MG tablet Take 20 mg by mouth daily.  04/28/13  Yes Historical Provider, MD  promethazine (PHENERGAN) 12.5 MG tablet Take 12.5 mg by mouth every 6 (six) hours as needed for nausea or vomiting (nausea and vomiting).   Yes Historical Provider, MD  SIMPONI 50 MG/0.5ML SOLN every 30 (thirty) days.  04/10/13   Historical Provider, MD   Allergies  Allergen Reactions  . Bee Venom   . Codeine     "seeing spots"  . Penicillins     REACTION: rash, itiching    FAMILY HISTORY:  Family History  Problem Relation  Age of Onset  . Diabetes Mother   . Rheum arthritis Mother   . Diabetes Father   . COPD Father   . Bronchitis Father   . Cancer Brother    SOCIAL HISTORY:  reports that she has been smoking Cigarettes.  She has been smoking about 1.00 pack per day. She has never used smokeless tobacco. She reports that she drinks about .5 ounces of alcohol per week. She reports that she does not use illicit drugs.  REVIEW OF SYSTEMS bold pos:   Constitutional: Negative for fever, chills, weight loss, malaise/fatigue and diaphoresis.  HENT: Negative for hearing loss, ear pain, nosebleeds, congestion, sore throat, neck pain, tinnitus and ear discharge.   Eyes: Negative for blurred vision, double vision, photophobia, pain, discharge and redness.  Respiratory: Negative for cough, improving, hemoptysis, sputum production, yellow, shortness of breath, w/ exertion  wheezing and stridor.   Cardiovascular: Negative for chest pain, palpitations, orthopnea, claudication, leg swelling and PND.  Gastrointestinal: Negative for heartburn, nausea, vomiting, abdominal pain, diarrhea, constipation, blood in stool and melena.  Genitourinary: Negative for dysuria, urgency, frequency, hematuria and flank pain.  Musculoskeletal: Negative for myalgias, back pain, joint pain and falls.  Skin: Negative for itching and rash.  Neurological: Negative for dizziness, tingling, tremors, sensory change, speech change, focal weakness, seizures, loss of consciousness, weakness and headaches.  Endo/Heme/Allergies: Negative for environmental allergies and polydipsia. Does not bruise/bleed easily.  SUBJECTIVE:   Feels a little better afer lasix yesterday; overall feels appetite improving and edema better but stillhypoxemic and deconditioned   VITAL SIGNS: Temp:  [97.6 F (36.4 C)-98.1 F (36.7 C)] 97.7 F (36.5 C) (06/18 0539) Pulse Rate:  [97-111] 99 (06/18 0539) Resp:  [20-21] 20 (06/18 0539) BP: (119-138)/(3-59) 138/3 mmHg (06/18  0539) SpO2:  [88 %-100 %] 100 % (06/18 0539) 1 liter  PHYSICAL EXAMINATION: General:  No acute distress Neuro:  Awake, alert, no focal def  HEENT:  [place], no JVD, tongue w/ raised red areas, posterior pharynx w/ blotchy erythremic areas    Cardiovascular:  rrr Lungs:  Diffuse rales, scattered rhonchi  Abdomen:  Soft, non tender  Musculoskeletal:  Intact  Skin:  LE edema    PULMONARY No results found for this basename: PHART, PCO2, PCO2ART, PO2, PO2ART, HCO3, TCO2, O2SAT,  in the last 168 hours  CBC  Recent Labs Lab 02/19/14 0428 02/20/14 0345 02/21/14 0426  HGB 10.7* 10.9* 10.3*  HCT 31.5* 32.9* 30.5*  WBC 14.6* 12.2* 10.5  PLT 199 224 228    COAGULATION No results found for this basename: INR,  in the last 168 hours  CARDIAC  No results found for this basename: TROPONINI,  in the last 168  hours  Recent Labs Lab 02/20/14 0350  PROBNP 2073.0*     CHEMISTRY  Recent Labs Lab 02/16/14 1308 02/17/14 0515 02/18/14 1150 02/19/14 0428 02/20/14 0345 02/21/14 0426  NA 132* 139 137 133* 135* 138  K 3.5* 2.9* 3.2* 2.8* 3.6* 3.0*  CL 88* 101 102 98 97 95*  CO2 24 22 23 21 26  32  GLUCOSE 158* 84 79 78 90 87  BUN 27* 19 15 13 14 14   CREATININE 1.08 0.73 0.57 0.58 0.70 0.68  CALCIUM 8.9 8.0* 8.1* 8.0* 8.1* 7.9*  MG  --  1.9  --   --   --   --    Estimated Creatinine Clearance: 69.4 ml/min (by C-G formula based on Cr of 0.68).   LIVER  Recent Labs Lab 02/16/14 1308  AST 28  ALT 13  ALKPHOS 122*  BILITOT 1.9*  PROT 6.9  ALBUMIN 2.3*     INFECTIOUS  Recent Labs Lab 02/16/14 1325 02/17/14 0515 02/20/14 1614  LATICACIDVEN 4.78* 2.5*  --   PROCALCITON  --   --  1.19     ENDOCRINE CBG (last 3)  No results found for this basename: GLUCAP,  in the last 72 hours       IMAGING x48h  Dg Chest 2 View  02/20/2014   CLINICAL DATA:  Pneumonia, cough and fever.  EXAM: CHEST  2 VIEW  COMPARISON:  February 16, 2014.  FINDINGS: Cardiomediastinal silhouette  appears normal. No pneumothorax or pleural effusion is noted. Stable right upper lobe airspace opacity is noted consistent with pneumonia. Left lower lobe opacity appears slightly enlarged concerning for pneumonia. Bony thorax is intact.  IMPRESSION: Stable right upper lobe pneumonia. Increased left lower lobe airspace opacity is noted concerning for worsening pneumonia.   Electronically Signed   By: Roque Lias M.D.   On: 02/20/2014 14:06   Dg Chest Port 1 View  02/21/2014   CLINICAL DATA:  Cough with known airspace disease  EXAM: PORTABLE CHEST - 1 VIEW  COMPARISON:  Portable chest x-ray of February 20, 2014  FINDINGS: There has been further interval increase in the diffuse interstitial infiltrates. Confluent density in the right upper lobe and left midlung is unchanged. The heart is normal in size. The pulmonary vascularity is obscured. There is no significant pleural effusion.  IMPRESSION: Progressive increase in the bilateral interstitial infiltrates compatible with progressive pneumonia. Stable alveolar consolidation in the right upper lobe and left midlung.   Electronically Signed   By: David  [place]   On: 02/21/2014 07:48       ASSESSMENT / PLAN: Pneumococcal Pneumonia Pneumococcal bacteremia  Grade I diastolic dysfxn Pulmonary edema RA on immunosuppression  Resolved sepsis Oral candidiasis   Discussion Clinically better but CXR worse despite lasix. Suspect ALI due to CAP/Bacteremia. She got only 1 day of dual Rx. Though better, I think giving few more days of dual CAP abx in presence of bacteremia might not be  A bad idea. Good to continue lasix and will add pulmonary toilet   Recs  -abx plan as above; add ceftriaxone for 48h (rash with PCN but looks like she tolerated ceftriaxone well in hospital x 1 dose)  -have escalated diuresis  -ck PCT  -hold Arava until abx course complete  -PRN nebs  -f/u CXR in am 6/18.   -then will need f/u CXR in couple weeks after d/c.  -wean FIO2 as  able   -diflucan     Dr. Kalman Shan, M.D., [facility].C.P Pulmonary and  Critical Care Medicine Staff Physician [facility] [place] [facility] Pager: [phone], If no answer or between  15:00h - 7:00h: call [phone]  02/21/2014 9:59 AM

## 2014-02-21 NOTE — Progress Notes (Signed)
ANTIBIOTIC CONSULT NOTE - FOLLOW UP  Pharmacy Consult for Ceftriaxone, Antibiotic renal dose adjustment Indication: pneumonia, bacteremia  Allergies  Allergen Reactions  . Bee Venom   . Codeine     "seeing spots"  . Penicillins     REACTION: rash, itiching    Patient Measurements: Height: 5\' 4"  (162.6 cm) Weight: 128 lb 1.4 oz (58.1 kg) IBW/kg (Calculated) : 54.7  Vital Signs: Temp: 97.7 F (36.5 C) (06/18 0539) Temp src: Oral (06/18 0539) BP: 138/3 mmHg (06/18 0539) Pulse Rate: 99 (06/18 0539) Intake/Output from previous day: 06/17 0701 - 06/18 0700 In: 270 [P.O.:120; IV Piggyback:150] Out: 3550 [Urine:3550]  Labs:  Recent Labs  02/19/14 0428 02/20/14 0345 02/21/14 0426  WBC 14.6* 12.2* 10.5  HGB 10.7* 10.9* 10.3*  PLT 199 224 228  CREATININE 0.58 0.70 0.68   Estimated Creatinine Clearance: 69.4 ml/min (by C-G formula based on Cr of 0.68).  Microbiology: Recent Results (from the past 720 hour(s))  URINE CULTURE     Status: None   Collection Time    02/16/14 12:26 PM      Result Value Ref Range Status   Specimen Description URINE, CLEAN CATCH   Final   Special Requests NONE   Final   Culture  Setup Time     Final   Value: 02/16/2014 18:46     Performed at 02/18/2014 Count     Final   Value: NO GROWTH     Performed at [facility]   Culture     Final   Value: NO GROWTH     Performed at [facility]   Report Status 02/17/2014 FINAL   Final  CULTURE, BLOOD (ROUTINE X 2)     Status: None   Collection Time    02/16/14  2:45 PM      Result Value Ref Range Status   Specimen Description BLOOD LEFT HAND  3 ML IN [facility] BOTTLE   Final   Special Requests NONE   Final   Culture  Setup Time     Final   Value: 02/16/2014 18:20     Performed at 02/18/2014   Culture     Final   Value: STREPTOCOCCUS PNEUMONIAE     Note: Gram Stain Report Called to,Read Back By and Verified With: [facility] 02/17/14 @ 1:25PM BY  RUSCOE A     Performed at 02/19/14   Report Status 02/19/2014 FINAL   Final   Organism ID, Bacteria STREPTOCOCCUS PNEUMONIAE   Final  CULTURE, BLOOD (ROUTINE X 2)     Status: None   Collection Time    02/16/14  3:00 PM      Result Value Ref Range Status   Specimen Description BLOOD RIGHT ARM  5 ML IN [facility] BOTTLE   Final   Special Requests NONE   Final   Culture  Setup Time     Final   Value: 02/16/2014 18:20     Performed at 02/18/2014   Culture     Final   Value: STREPTOCOCCUS PNEUMONIAE     Note: SUSCEPTIBILITIES PERFORMED ON PREVIOUS CULTURE WITHIN THE LAST 5 DAYS.     Note: Gram Stain Report Called to,Read Back By and Verified With: [facility] 02/17/14 @ 1:25PM BY RUSCOE A.     Performed at 02/19/14   Report Status 02/19/2014 FINAL   Final  MRSA PCR SCREENING     Status: None  Collection Time    02/16/14  3:39 PM      Result Value Ref Range Status   MRSA by PCR NEGATIVE  NEGATIVE Final   Comment:            The GeneXpert MRSA Assay (FDA     approved for NASAL specimens     only), is one component of a     comprehensive MRSA colonization     surveillance program. It is not     intended to diagnose MRSA     infection nor to guide or     monitor treatment for     MRSA infections.  CULTURE, EXPECTORATED SPUTUM-ASSESSMENT     Status: None   Collection Time    02/16/14  4:23 PM      Result Value Ref Range Status   Specimen Description SPUTUM   Final   Special Requests NONE   Final   Sputum evaluation     Final   Value: THIS SPECIMEN IS ACCEPTABLE. RESPIRATORY CULTURE REPORT TO FOLLOW.   Report Status 02/16/2014 FINAL   Final  CULTURE, RESPIRATORY (NON-EXPECTORATED)     Status: None   Collection Time    02/16/14  4:23 PM      Result Value Ref Range Status   Specimen Description SPUTUM   Final   Special Requests NONE   Final   Gram Stain     Final   Value: MODERATE WBC PRESENT, PREDOMINANTLY PMN     FEW SQUAMOUS EPITHELIAL CELLS  PRESENT     ABUNDANT GRAM POSITIVE COCCI IN PAIRS     IN CHAINS FEW Y     Performed at [facility]   Culture     Final   Value: MODERATE STREPTOCOCCUS PNEUMONIAE     Performed at [facility]   Report Status 02/19/2014 FINAL   Final   Organism ID, Bacteria STREPTOCOCCUS PNEUMONIAE   Final   Anti-infectives: 6/13 >> Ceftriaxone x 1 6/13 >>  Levaquin  >> 6/15 >>  Vancomycin >> 6/16 6/18 >> Ceftriaxone >> (6/19, 2 doses only)   Assessment: 53 yoF admitted on 6/13 with sepsis, SOB, productive cough.  CXR consistent with PNA,  Sputum and Blood cultures with strep pneumoniae (Sens: PCN, ceftriaxone, levaquin).  Initially on ceftriaxone, then transitioned to Levaquin.  She received only 1 day of Vancomycin on 6/15 for r/o MRSA.  Pharmacy consulted to dose ceftriaxone for 2 more days to double cover strep pneumo bacteremia and worsening CXR.  6/18:  Day # 6 Levaquin IV, D # 1/2 Ceftriaxone  Tmax: Afebrile  WBCs: Improved to WNL, 10.5  Renal: Stable, SCr 0.68, CrCl 69 ml/min  Echo negative for vegetations at valves.   Worsening CXR:  Progressive increase in the bilateral interstitial infiltrates compatible with progressive pneumonia  Noted PCN allergy (rash, itching) but pt tolerated ceftriaxone dose on 6/13.  Goal of Therapy:  Appropriate abx dosing, eradication of infection.   Plan:   Continue Levaquin 750mg  IV q24h  Ceftriaxone 1g IV q24h x2 hours  Follow up renal function and cultures as available.  PharmD, [facility] Pager [phone] 02/21/2014 10:33 AM

## 2014-02-21 NOTE — Progress Notes (Signed)
[facility] PROGRESS NOTE  Kimberly Shelton QPR:[id number] DOB: 1960-11-13 DOA: 02/16/2014 PCP: Kristian Covey, MD  Brief Narrative: 53 yo female who presented to [facility] ED with main concern of several days duration of progressively worsening shortness of breath, associated with productive cough of yellow sputum, malaise, poor oral intake, worse with exertion and occasionally improved with rest. PT explains she was seen by her PCP 5 days PTA and was treated with Z-Pack with no significant improvement in her symptoms. She denies similar events in the past, no known recent sick contacts or exposures.  In ED, pt noted to have SBP in 80-90's but said she typically has low BP in 90's/50's at home. Pt also note to have HR in 110 - 120's with RR in high 20's. CXR c/w PNA and [facility] asked to admit to SDU for further evlauation Found to be septic. Source of infection at lungs. Growing strep pneumoniae and blood cultures x2.   Assessment/Plan:  1. Sepsis - Resolved,  - Improving with current antibiotic on board. - Blood cultures positive x 2 currently sensitive to Levaquin  2. Bacteremia - Growing strep pneumo. Sensitive to levaquin. Will need 2 weeks of total antibiotic therapy - Echocardiogram negative for vegetations at valves.  - also started on rocephin IV for 48 hrs by pulmonary  3 .Pneumonia - Positive Strep Pneumonia urinary antigen - Blood cx reports sensitive to levaquin. - Also started Ceftriaxone.  4. Hypokalemia - Will order K dur twice daily, check bmp in am.  5 Pulmonary edema - Patient is positive fluid overload, has elevated BNP 2073. Chest x-ray showing fluid overload. Lasix 40 mg IV every 12 hours started.  6. Rheumatoid arthritis - Will hold Arava till the antibiotic course completed.  Code Status: full Family Communication: Discussed with patient and husband at bedside Disposition Plan: Home when  stable   Consultants:  None  Procedures:  None  Antibiotics:  Levaquin  HPI/Subjective: Patient is breathing better after the diuresis, though she feels weak.  Objective: Filed Vitals:   02/21/14 0539  BP: 138/3  Pulse: 99  Temp: 97.7 F (36.5 C)  Resp: 20    Intake/Output Summary (Last 24 hours) at 02/21/14 1345 Last data filed at 02/21/14 0930  Gross per 24 hour  Intake    270 ml  Output   3200 ml  Net  -2930 ml   Filed Weights   02/16/14 1600  Weight: 58.1 kg (128 lb 1.4 oz)    Exam:  Physical Exam: Head: Normocephalic, atraumatic.  Lungs: Normal respiratory effort. B/L rhonchi Heart: Regular RR. S1 and S2 normal  Abdomen: BS normoactive. Soft, Nondistended, non-tender.  Extremities: No pretibial edema, no erythema  Data Reviewed: Basic Metabolic Panel:  Recent Labs Lab 02/16/14 1308 02/17/14 0515 02/18/14 1150 02/19/14 0428 02/20/14 0345 02/21/14 0426  NA 132* 139 137 133* 135* 138  K 3.5* 2.9* 3.2* 2.8* 3.6* 3.0*  CL 88* 101 102 98 97 95*  CO2 24 22 23 21 26  32  GLUCOSE 158* 84 79 78 90 87  BUN 27* 19 15 13 14 14   CREATININE 1.08 0.73 0.57 0.58 0.70 0.68  CALCIUM 8.9 8.0* 8.1* 8.0* 8.1* 7.9*  MG  --  1.9  --   --   --   --    Liver Function Tests:  Recent Labs Lab 02/16/14 1308  AST 28  ALT 13  ALKPHOS 122*  BILITOT 1.9*  PROT 6.9  ALBUMIN 2.3*   No results found for  this basename: LIPASE, AMYLASE,  in the last 168 hours No results found for this basename: AMMONIA,  in the last 168 hours CBC:  Recent Labs Lab 02/16/14 1308 02/17/14 0515 02/19/14 0428 02/20/14 0345 02/21/14 0426  WBC 10.9* 9.4 14.6* 12.2* 10.5  NEUTROABS 9.9*  --   --   --   --   HGB 13.4 12.5 10.7* 10.9* 10.3*  HCT 38.3 36.9 31.5* 32.9* 30.5*  MCV 84.2 85.4 85.8 86.8 86.4  PLT 213 202 199 224 228   Cardiac Enzymes: No results found for this basename: CKTOTAL, CKMB, CKMBINDEX, TROPONINI,  in the last 168 hours BNP (last 3 results)  Recent  Labs  02/20/14 0350  PROBNP 2073.0*   CBG: No results found for this basename: GLUCAP,  in the last 168 hours  Recent Results (from the past 240 hour(s))  URINE CULTURE     Status: None   Collection Time    02/16/14 12:26 PM      Result Value Ref Range Status   Specimen Description URINE, CLEAN CATCH   Final   Special Requests NONE   Final   Culture  Setup Time     Final   Value: 02/16/2014 18:46     Performed at [facility] Count     Final   Value: NO GROWTH     Performed at [facility]   Culture     Final   Value: NO GROWTH     Performed at [facility]   Report Status 02/17/2014 FINAL   Final  CULTURE, BLOOD (ROUTINE X 2)     Status: None   Collection Time    02/16/14  2:45 PM      Result Value Ref Range Status   Specimen Description BLOOD LEFT HAND  3 ML IN [facility] BOTTLE   Final   Special Requests NONE   Final   Culture  Setup Time     Final   Value: 02/16/2014 18:20     Performed at [facility]   Culture     Final   Value: STREPTOCOCCUS PNEUMONIAE     Note: Gram Stain Report Called to,Read Back By and Verified With: Sumner Boast 02/17/14 @ 1:25PM BY RUSCOE A     Performed at [facility]   Report Status 02/19/2014 FINAL   Final   Organism ID, Bacteria STREPTOCOCCUS PNEUMONIAE   Final  CULTURE, BLOOD (ROUTINE X 2)     Status: None   Collection Time    02/16/14  3:00 PM      Result Value Ref Range Status   Specimen Description BLOOD RIGHT ARM  5 ML IN [facility] BOTTLE   Final   Special Requests NONE   Final   Culture  Setup Time     Final   Value: 02/16/2014 18:20     Performed at [facility]   Culture     Final   Value: STREPTOCOCCUS PNEUMONIAE     Note: SUSCEPTIBILITIES PERFORMED ON PREVIOUS CULTURE WITHIN THE LAST 5 DAYS.     Note: Gram Stain Report Called to,Read Back By and Verified With: Sumner Boast 02/17/14 @ 1:25PM BY RUSCOE A.     Performed at [facility]   Report Status  02/19/2014 FINAL   Final  MRSA PCR SCREENING     Status: None   Collection Time    02/16/14  3:39 PM      Result Value Ref Range  Status   MRSA by PCR NEGATIVE  NEGATIVE Final   Comment:            The GeneXpert MRSA Assay (FDA     approved for NASAL specimens     only), is one component of a     comprehensive MRSA colonization     surveillance program. It is not     intended to diagnose MRSA     infection nor to guide or     monitor treatment for     MRSA infections.  CULTURE, EXPECTORATED SPUTUM-ASSESSMENT     Status: None   Collection Time    02/16/14  4:23 PM      Result Value Ref Range Status   Specimen Description SPUTUM   Final   Special Requests NONE   Final   Sputum evaluation     Final   Value: THIS SPECIMEN IS ACCEPTABLE. RESPIRATORY CULTURE REPORT TO FOLLOW.   Report Status 02/16/2014 FINAL   Final  CULTURE, RESPIRATORY (NON-EXPECTORATED)     Status: None   Collection Time    02/16/14  4:23 PM      Result Value Ref Range Status   Specimen Description SPUTUM   Final   Special Requests NONE   Final   Gram Stain     Final   Value: MODERATE WBC PRESENT, PREDOMINANTLY PMN     FEW SQUAMOUS EPITHELIAL CELLS PRESENT     ABUNDANT GRAM POSITIVE COCCI IN PAIRS     IN CHAINS FEW Y     Performed at [facility]   Culture     Final   Value: MODERATE STREPTOCOCCUS PNEUMONIAE     Performed at [facility]   Report Status 02/19/2014 FINAL   Final   Organism ID, Bacteria STREPTOCOCCUS PNEUMONIAE   Final     Studies: Dg Chest 2 View  02/20/2014   CLINICAL DATA:  Pneumonia, cough and fever.  EXAM: CHEST  2 VIEW  COMPARISON:  February 16, 2014.  FINDINGS: Cardiomediastinal silhouette appears normal. No pneumothorax or pleural effusion is noted. Stable right upper lobe airspace opacity is noted consistent with pneumonia. Left lower lobe opacity appears slightly enlarged concerning for pneumonia. Bony thorax is intact.  IMPRESSION: Stable right upper lobe pneumonia.  Increased left lower lobe airspace opacity is noted concerning for worsening pneumonia.   Electronically Signed   By: Roque Lias M.D.   On: 02/20/2014 14:06   Dg Chest Port 1 View  02/21/2014   CLINICAL DATA:  Cough with known airspace disease  EXAM: PORTABLE CHEST - 1 VIEW  COMPARISON:  Portable chest x-ray of February 20, 2014  FINDINGS: There has been further interval increase in the diffuse interstitial infiltrates. Confluent density in the right upper lobe and left midlung is unchanged. The heart is normal in size. The pulmonary vascularity is obscured. There is no significant pleural effusion.  IMPRESSION: Progressive increase in the bilateral interstitial infiltrates compatible with progressive pneumonia. Stable alveolar consolidation in the right upper lobe and left midlung.   Electronically Signed   By: David  [place]   On: 02/21/2014 07:48    Scheduled Meds: . cefTRIAXone (ROCEPHIN)  IV  1 g Intravenous Daily  . dextromethorphan-guaiFENesin  1 tablet Oral BID  . enoxaparin (LOVENOX) injection  40 mg Subcutaneous Q24H  . escitalopram  10 mg Oral Daily  . fluconazole  100 mg Oral Daily  . furosemide  40 mg Intravenous Q12H  . ipratropium-albuterol  3 mL Nebulization Q6H  .  levofloxacin (LEVAQUIN) IV  750 mg Intravenous Q24H  . potassium chloride  40 mEq Oral Q12H   Continuous Infusions:    Principal Problem:   Sepsis Active Problems:   PNA (pneumonia)   Sepsis associated hypotension   Hypokalemia   Bacteremia   Chronic diastolic heart failure   Pneumococcal lobar pneumonia   Pneumococcal bacteremia   Acute lung injury    Time spent: 35 minutes    [facility]  [facility] Pager [phone] If 7PM-7AM, please contact night-coverage at [email or web address], password [facility] 02/21/2014, 1:45 PM  LOS: 5 days

## 2014-02-22 ENCOUNTER — Inpatient Hospital Stay (HOSPITAL_COMMUNITY): Payer: Managed Care, Other (non HMO)

## 2014-02-22 DIAGNOSIS — B954 Other streptococcus as the cause of diseases classified elsewhere: Secondary | ICD-10-CM

## 2014-02-22 LAB — CBC
HCT: 31.5 % — ABNORMAL LOW (ref 36.0–46.0)
Hemoglobin: 10.4 g/dL — ABNORMAL LOW (ref 12.0–15.0)
MCH: 28.8 pg (ref 26.0–34.0)
MCHC: 33 g/dL (ref 30.0–36.0)
MCV: 87.3 fL (ref 78.0–100.0)
PLATELETS: 254 10*3/uL (ref 150–400)
RBC: 3.61 MIL/uL — AB (ref 3.87–5.11)
RDW: 14.1 % (ref 11.5–15.5)
WBC: 11.7 10*3/uL — ABNORMAL HIGH (ref 4.0–10.5)

## 2014-02-22 LAB — BASIC METABOLIC PANEL
BUN: 12 mg/dL (ref 6–23)
CALCIUM: 8 mg/dL — AB (ref 8.4–10.5)
CO2: 33 meq/L — AB (ref 19–32)
Chloride: 88 mEq/L — ABNORMAL LOW (ref 96–112)
Creatinine, Ser: 0.62 mg/dL (ref 0.50–1.10)
GFR calc Af Amer: >90 mL/min (ref >90)
GFR calc non Af Amer: >90 mL/min (ref >90)
GLUCOSE: 97 mg/dL (ref 70–99)
Potassium: 3.1 mEq/L — ABNORMAL LOW (ref 3.7–5.3)
Sodium: 136 mEq/L — ABNORMAL LOW (ref 137–147)

## 2014-02-22 LAB — PHOSPHORUS: Phosphorus: 3 mg/dL (ref 2.3–4.6)

## 2014-02-22 LAB — PROCALCITONIN: Procalcitonin: 0.49 ng/mL

## 2014-02-22 LAB — MAGNESIUM: Magnesium: 1.2 mg/dL — ABNORMAL LOW (ref 1.5–2.5)

## 2014-02-22 LAB — PRO B NATRIURETIC PEPTIDE: Pro B Natriuretic peptide (BNP): 1456 pg/mL — ABNORMAL HIGH (ref 0–125)

## 2014-02-22 MED ORDER — POTASSIUM CHLORIDE CRYS ER 20 MEQ PO TBCR
40.0000 meq | EXTENDED_RELEASE_TABLET | Freq: Three times a day (TID) | ORAL | Status: DC
  Administered 2014-02-22 (×2): 40 meq via ORAL
  Filled 2014-02-22 (×5): qty 2

## 2014-02-22 MED ORDER — LEVOFLOXACIN 750 MG PO TABS
750.0000 mg | ORAL_TABLET | Freq: Every day | ORAL | Status: DC
  Administered 2014-02-23 – 2014-02-24 (×2): 750 mg via ORAL
  Filled 2014-02-22 (×2): qty 1

## 2014-02-22 MED ORDER — IPRATROPIUM-ALBUTEROL 0.5-2.5 (3) MG/3ML IN SOLN
3.0000 mL | Freq: Three times a day (TID) | RESPIRATORY_TRACT | Status: DC
  Administered 2014-02-23 – 2014-02-24 (×4): 3 mL via RESPIRATORY_TRACT
  Filled 2014-02-22 (×4): qty 3

## 2014-02-22 NOTE — Progress Notes (Signed)
Pt refused her 14:00 neb tx. Pt in no distress at this time. 

## 2014-02-22 NOTE — Progress Notes (Signed)
[facility] PROGRESS NOTE  Kimberly Shelton ZDG:[id number] DOB: 07-May-1961 DOA: 02/16/2014 PCP: Kristian Covey, MD  Brief Narrative: 53 yo female who presented to [facility] ED with main concern of several days duration of progressively worsening shortness of breath, associated with productive cough of yellow sputum, malaise, poor oral intake, worse with exertion and occasionally improved with rest. PT explains she was seen by her PCP 5 days PTA and was treated with Z-Pack with no significant improvement in her symptoms. She denies similar events in the past, no known recent sick contacts or exposures.  In ED, pt noted to have SBP in 80-90's but said she typically has low BP in 90's/50's at home. Pt also note to have HR in 110 - 120's with RR in high 20's. CXR c/w PNA and TRH asked to admit to SDU for further evlauation Found to be septic. Source of infection at lungs. Growing strep pneumoniae and blood cultures x2.   Assessment/Plan:  1. Sepsis - Resolved,  - Improving with current antibiotic on board. - Blood cultures positive x 2 currently sensitive to Levaquin  2. Bacteremia - Growing strep pneumo. Sensitive to levaquin. Will need 2 weeks of total antibiotic therapy - Echocardiogram negative for vegetations at valves.  - also started on rocephin IV for 48 hrs by pulmonary  3 .Pneumonia - Positive Strep Pneumonia urinary antigen - Blood cx reports sensitive to levaquin. - Also started Ceftriaxone for 48 hrs  4. Hypokalemia - Will change the Kdur to 40 meq po TID - check BMP in am.  5 Pulmonary edema - Patient is positive fluid overload, has elevated BNP 2073. Chest x-ray showing fluid overload. Lasix 40 mg IV every 12 hours started. - has now improved, with diuresis. - Follow BMP in am.  6. Rheumatoid arthritis - Will hold Arava till the antibiotic course completed.  Code Status: full Family Communication: Discussed with patient and husband at bedside Disposition Plan:  Home when stable   Consultants:  None  Procedures:  None  Antibiotics:  Levaquin  HPI/Subjective: Patient is breathing better after the diuresis, though she feels weak.CXR today shows much improvement.  Objective: Filed Vitals:   02/22/14 1346  BP: 115/62  Pulse: 80  Temp: 98.1 F (36.7 C)  Resp: 18    Intake/Output Summary (Last 24 hours) at 02/22/14 1424 Last data filed at 02/22/14 1245  Gross per 24 hour  Intake    870 ml  Output   3350 ml  Net  -2480 ml   Filed Weights   02/16/14 1600 02/22/14 0512  Weight: 58.1 kg (128 lb 1.4 oz) 58.4 kg (128 lb 12 oz)    Exam:  Physical Exam: Head: Normocephalic, atraumatic.  Lungs: Normal respiratory effort. B/L rhonchi Heart: Regular RR. S1 and S2 normal  Abdomen: BS normoactive. Soft, Nondistended, non-tender.  Extremities: No pretibial edema, no erythema  Data Reviewed: Basic Metabolic Panel:  Recent Labs Lab 02/16/14 1308 02/17/14 0515 02/18/14 1150 02/19/14 0428 02/20/14 0345 02/21/14 0426 02/22/14 0338  NA 132* 139 137 133* 135* 138 136*  K 3.5* 2.9* 3.2* 2.8* 3.6* 3.0* 3.1*  CL 88* 101 102 98 97 95* 88*  CO2 24 22 23 21 26  32 33*  GLUCOSE 158* 84 79 78 90 87 97  BUN 27* 19 15 13 14 14 12   CREATININE 1.08 0.73 0.57 0.58 0.70 0.68 0.62  CALCIUM 8.9 8.0* 8.1* 8.0* 8.1* 7.9* 8.0*  MG  --  1.9  --   --   --   --  1.2*  PHOS  --   --   --   --   --   --  3.0   Liver Function Tests:  Recent Labs Lab 02/16/14 1308  AST 28  ALT 13  ALKPHOS 122*  BILITOT 1.9*  PROT 6.9  ALBUMIN 2.3*   No results found for this basename: LIPASE, AMYLASE,  in the last 168 hours No results found for this basename: AMMONIA,  in the last 168 hours CBC:  Recent Labs Lab 02/16/14 1308 02/17/14 0515 02/19/14 0428 02/20/14 0345 02/21/14 0426 02/22/14 0338  WBC 10.9* 9.4 14.6* 12.2* 10.5 11.7*  NEUTROABS 9.9*  --   --   --   --   --   HGB 13.4 12.5 10.7* 10.9* 10.3* 10.4*  HCT 38.3 36.9 31.5* 32.9* 30.5*  31.5*  MCV 84.2 85.4 85.8 86.8 86.4 87.3  PLT 213 202 199 224 228 254   Cardiac Enzymes: No results found for this basename: CKTOTAL, CKMB, CKMBINDEX, TROPONINI,  in the last 168 hours BNP (last 3 results)  Recent Labs  02/20/14 0350 02/22/14 0338  PROBNP 2073.0* 1456.0*   CBG: No results found for this basename: GLUCAP,  in the last 168 hours  Recent Results (from the past 240 hour(s))  URINE CULTURE     Status: None   Collection Time    02/16/14 12:26 PM      Result Value Ref Range Status   Specimen Description URINE, CLEAN CATCH   Final   Special Requests NONE   Final   Culture  Setup Time     Final   Value: 02/16/2014 18:46     Performed at [facility] Count     Final   Value: NO GROWTH     Performed at [facility]   Culture     Final   Value: NO GROWTH     Performed at [facility]   Report Status 02/17/2014 FINAL   Final  CULTURE, BLOOD (ROUTINE X 2)     Status: None   Collection Time    02/16/14  2:45 PM      Result Value Ref Range Status   Specimen Description BLOOD LEFT HAND  3 ML IN [facility] BOTTLE   Final   Special Requests NONE   Final   Culture  Setup Time     Final   Value: 02/16/2014 18:20     Performed at [facility]   Culture     Final   Value: STREPTOCOCCUS PNEUMONIAE     Note: Gram Stain Report Called to,Read Back By and Verified With: Sumner Boast 02/17/14 @ 1:25PM BY RUSCOE A     Performed at [facility]   Report Status 02/19/2014 FINAL   Final   Organism ID, Bacteria STREPTOCOCCUS PNEUMONIAE   Final  CULTURE, BLOOD (ROUTINE X 2)     Status: None   Collection Time    02/16/14  3:00 PM      Result Value Ref Range Status   Specimen Description BLOOD RIGHT ARM  5 ML IN [facility] BOTTLE   Final   Special Requests NONE   Final   Culture  Setup Time     Final   Value: 02/16/2014 18:20     Performed at [facility]   Culture     Final   Value: STREPTOCOCCUS PNEUMONIAE     Note:  SUSCEPTIBILITIES PERFORMED ON PREVIOUS CULTURE WITHIN THE LAST 5 DAYS.  Note: Gram Stain Report Called to,Read Back By and Verified With: Sumner Boast 02/17/14 @ 1:25PM BY RUSCOE A.     Performed at [facility]   Report Status 02/19/2014 FINAL   Final  MRSA PCR SCREENING     Status: None   Collection Time    02/16/14  3:39 PM      Result Value Ref Range Status   MRSA by PCR NEGATIVE  NEGATIVE Final   Comment:            The GeneXpert MRSA Assay (FDA     approved for NASAL specimens     only), is one component of a     comprehensive MRSA colonization     surveillance program. It is not     intended to diagnose MRSA     infection nor to guide or     monitor treatment for     MRSA infections.  CULTURE, EXPECTORATED SPUTUM-ASSESSMENT     Status: None   Collection Time    02/16/14  4:23 PM      Result Value Ref Range Status   Specimen Description SPUTUM   Final   Special Requests NONE   Final   Sputum evaluation     Final   Value: THIS SPECIMEN IS ACCEPTABLE. RESPIRATORY CULTURE REPORT TO FOLLOW.   Report Status 02/16/2014 FINAL   Final  CULTURE, RESPIRATORY (NON-EXPECTORATED)     Status: None   Collection Time    02/16/14  4:23 PM      Result Value Ref Range Status   Specimen Description SPUTUM   Final   Special Requests NONE   Final   Gram Stain     Final   Value: MODERATE WBC PRESENT, PREDOMINANTLY PMN     FEW SQUAMOUS EPITHELIAL CELLS PRESENT     ABUNDANT GRAM POSITIVE COCCI IN PAIRS     IN CHAINS FEW Y     Performed at [facility]   Culture     Final   Value: MODERATE STREPTOCOCCUS PNEUMONIAE     Performed at [facility]   Report Status 02/19/2014 FINAL   Final   Organism ID, Bacteria STREPTOCOCCUS PNEUMONIAE   Final     Studies: Dg Chest 2 View  02/22/2014   CLINICAL DATA:  Pneumonia  EXAM: CHEST  2 VIEW  COMPARISON:  02/21/2014  FINDINGS: Cardiac shadow is stable. Bilateral infiltrative changes are again identified and given some  technical variations in the film relatively stable. No sizable effusion is seen. No acute bony abnormality is noted.  IMPRESSION: Diffuse bilateral infiltrates. The overall appearance is stable given some technical variations in the films.   Electronically Signed   By: Alcide Clever M.D.   On: 02/22/2014 08:16   Dg Chest Port 1 View  02/21/2014   CLINICAL DATA:  Cough with known airspace disease  EXAM: PORTABLE CHEST - 1 VIEW  COMPARISON:  Portable chest x-ray of February 20, 2014  FINDINGS: There has been further interval increase in the diffuse interstitial infiltrates. Confluent density in the right upper lobe and left midlung is unchanged. The heart is normal in size. The pulmonary vascularity is obscured. There is no significant pleural effusion.  IMPRESSION: Progressive increase in the bilateral interstitial infiltrates compatible with progressive pneumonia. Stable alveolar consolidation in the right upper lobe and left midlung.   Electronically Signed   By: David  [place]   On: 02/21/2014 07:48    Scheduled Meds: . dextromethorphan-guaiFENesin  1 tablet  Oral BID  . enoxaparin (LOVENOX) injection  40 mg Subcutaneous Q24H  . escitalopram  10 mg Oral Daily  . fluconazole  100 mg Oral Daily  . furosemide  40 mg Intravenous Q12H  . ipratropium-albuterol  3 mL Nebulization Q6H  . levofloxacin (LEVAQUIN) IV  750 mg Intravenous Q24H  . [START ON 02/23/2014] levofloxacin  750 mg Oral Daily  . potassium chloride  40 mEq Oral Q12H   Continuous Infusions:    Principal Problem:   Sepsis Active Problems:   PNA (pneumonia)   Sepsis associated hypotension   Hypokalemia   Bacteremia   Chronic diastolic heart failure   Pneumococcal lobar pneumonia   Pneumococcal bacteremia   Acute lung injury    Time spent: 35 minutes    [facility]  [facility] Pager [phone] If 7PM-7AM, please contact night-coverage at [email or web address], password [facility] 02/22/2014, 2:24 PM  LOS: 6 days

## 2014-02-22 NOTE — Progress Notes (Signed)
ANTIBIOTIC CONSULT NOTE - FOLLOW UP  Pharmacy Consult for PO Levaquin Indication: pneumonia, bacteremia  Allergies  Allergen Reactions  . Bee Venom   . Codeine     "seeing spots"  . Penicillins     REACTION: rash, itiching   Labs:  Recent Labs  02/20/14 0345 02/21/14 0426 02/22/14 0338  WBC 12.2* 10.5 11.7*  HGB 10.9* 10.3* 10.4*  PLT 224 228 254  CREATININE 0.70 0.68 0.62   Estimated Creatinine Clearance: 69.4 ml/min (by C-G formula based on Cr of 0.62).  Anti-infectives: 6/13 >> Ceftriaxone x 1 6/13 >>  Levaquin  >> 6/15 >>  Vancomycin >> 6/16 6/18 >> Ceftriaxone >> 6/19, 2 doses   Assessment: Kimberly Shelton admitted on 6/13 with sepsis, SOB, productive cough.  CXR consistent with PNA,  Sputum and Blood cultures with strep pneumoniae (Sens: PCN, ceftriaxone, levaquin).  Initially on ceftriaxone, then transitioned to Levaquin.  She received only 1 day of Vancomycin on 6/15 for r/o MRSA.  Pharmacy consulted to dose ceftriaxone for 2 more days to double cover strep pneumo bacteremia and worsening CXR.  6/19:  Day # 7/14 Levaquin IV, D # 2/2 Ceftriaxone  Tmax: Afebrile  WBCs: increased, 11.7  Renal: Stable, SCr 0.62, CrCl 69 ml/min  Echo negative for vegetations at valves.   MD reports clinical improvement   Goal of Therapy:  Appropriate abx dosing, eradication of infection.   Plan:   Continue Levaquin 750mg  IV q24h today  Dr. requests change to PO Levaquin starting 6/20 to complete 14 day course  Follow up renal function and cultures as available.   7/20 PharmD, BCPS Pager [phone] 02/22/2014 10:24 AM

## 2014-02-23 LAB — MAGNESIUM: Magnesium: 1.3 mg/dL — ABNORMAL LOW (ref 1.5–2.5)

## 2014-02-23 LAB — BASIC METABOLIC PANEL
BUN: 13 mg/dL (ref 6–23)
CO2: 37 meq/L — AB (ref 19–32)
Calcium: 8.4 mg/dL (ref 8.4–10.5)
Chloride: 88 mEq/L — ABNORMAL LOW (ref 96–112)
Creatinine, Ser: 0.58 mg/dL (ref 0.50–1.10)
GFR calc Af Amer: >90 mL/min (ref >90)
GFR calc non Af Amer: >90 mL/min (ref >90)
GLUCOSE: 95 mg/dL (ref 70–99)
POTASSIUM: 3.1 meq/L — AB (ref 3.7–5.3)
Sodium: 137 mEq/L (ref 137–147)

## 2014-02-23 LAB — PHOSPHORUS: Phosphorus: 3.1 mg/dL (ref 2.3–4.6)

## 2014-02-23 MED ORDER — POTASSIUM CHLORIDE 20 MEQ/15ML (10%) PO LIQD
40.0000 meq | Freq: Three times a day (TID) | ORAL | Status: AC
  Administered 2014-02-23 (×3): 40 meq via ORAL
  Filled 2014-02-23 (×3): qty 30

## 2014-02-23 NOTE — Progress Notes (Signed)
Flutter valve given to pt> pt knows and understands how to use.

## 2014-02-23 NOTE — Progress Notes (Signed)
[facility] PROGRESS NOTE  Kimberly Shelton TWS:[id number] DOB: 06-May-1961 DOA: 02/16/2014 PCP: Kristian Covey, MD  Brief Narrative: 53 yo female who presented to [facility] ED with main concern of several days duration of progressively worsening shortness of breath, associated with productive cough of yellow sputum, malaise, poor oral intake, worse with exertion and occasionally improved with rest. PT explains she was seen by her PCP 5 days PTA and was treated with Z-Pack with no significant improvement in her symptoms. She denies similar events in the past, no known recent sick contacts or exposures.  In ED, pt noted to have SBP in 80-90's but said she typically has low BP in 90's/50's at home. Pt also note to have HR in 110 - 120's with RR in high 20's. CXR c/w PNA and TRH asked to admit to SDU for further evlauation Found to be septic. Source of infection at lungs. Growing strep pneumoniae and blood cultures x2.   Assessment/Plan:  1. Sepsis - Resolved,  - Improving with current antibiotic on board. - Blood cultures positive x 2 currently sensitive to Levaquin  2. Bacteremia - Growing strep pneumo. Sensitive to levaquin. Will need 2 weeks of total antibiotic therapy - Echocardiogram negative for vegetations at valves.  - also started on rocephin IV for 48 hrs by pulmonary  3 .Pneumonia - Positive Strep Pneumonia urinary antigen - Blood cx reports sensitive to levaquin. - Also started Ceftriaxone for 48 hrs  4. Hypokalemia - Will change the Kdur to 40 meq po TID - check BMP in am.  5 Pulmonary edema - Patient is positive fluid overload, has elevated BNP 2073. Chest x-ray showing fluid overload. Will change the lasix to 40 mg po BID. - has now improved, with diuresis. - Follow BMP in am.  6. Rheumatoid arthritis - Will hold Arava till the antibiotic course completed.  Code Status: full Family Communication: Discussed with patient and husband at bedside Disposition Plan:  Home when stable   Consultants:  None  Procedures:  None  Antibiotics:  Levaquin  HPI/Subjective: Patient is breathing better after the diuresis, though she feels weak.CXR today shows much improvement. Potassium is still low.  Objective: Filed Vitals:   02/23/14 1300  BP: 102/50  Pulse: 110  Temp: 98.4 F (36.9 C)  Resp: 18    Intake/Output Summary (Last 24 hours) at 02/23/14 1401 Last data filed at 02/23/14 1300  Gross per 24 hour  Intake    740 ml  Output    750 ml  Net    -10 ml   Filed Weights   02/16/14 1600 02/22/14 0512 02/23/14 0515  Weight: 58.1 kg (128 lb 1.4 oz) 58.4 kg (128 lb 12 oz) 56.8 kg (125 lb 3.5 oz)    Exam:  Physical Exam: Head: Normocephalic, atraumatic.  Lungs: Normal respiratory effort. Clear to auscultation bilaterally Heart: Regular RR. S1 and S2 normal  Abdomen: BS normoactive. Soft, Nondistended, non-tender.  Extremities: No pretibial edema, no erythema  Data Reviewed: Basic Metabolic Panel:  Recent Labs Lab 02/17/14 0515  02/19/14 0428 02/20/14 0345 02/21/14 0426 02/22/14 0338 02/23/14 0403  NA 139  < > 133* 135* 138 136* 137  K 2.9*  < > 2.8* 3.6* 3.0* 3.1* 3.1*  CL 101  < > 98 97 95* 88* 88*  CO2 22  < > 21 26 32 33* 37*  GLUCOSE 84  < > 78 90 87 97 95  BUN 19  < > 13 14 14 12 13   CREATININE  0.73  < > 0.58 0.70 0.68 0.62 0.58  CALCIUM 8.0*  < > 8.0* 8.1* 7.9* 8.0* 8.4  MG 1.9  --   --   --   --  1.2* 1.3*  PHOS  --   --   --   --   --  3.0 3.1  < > = values in this interval not displayed. Liver Function Tests: No results found for this basename: AST, ALT, ALKPHOS, BILITOT, PROT, ALBUMIN,  in the last 168 hours No results found for this basename: LIPASE, AMYLASE,  in the last 168 hours No results found for this basename: AMMONIA,  in the last 168 hours CBC:  Recent Labs Lab 02/17/14 0515 02/19/14 0428 02/20/14 0345 02/21/14 0426 02/22/14 0338  WBC 9.4 14.6* 12.2* 10.5 11.7*  HGB 12.5 10.7* 10.9* 10.3*  10.4*  HCT 36.9 31.5* 32.9* 30.5* 31.5*  MCV 85.4 85.8 86.8 86.4 87.3  PLT 202 199 224 228 254   Cardiac Enzymes: No results found for this basename: CKTOTAL, CKMB, CKMBINDEX, TROPONINI,  in the last 168 hours BNP (last 3 results)  Recent Labs  02/20/14 0350 02/22/14 0338  PROBNP 2073.0* 1456.0*   CBG: No results found for this basename: GLUCAP,  in the last 168 hours  Recent Results (from the past 240 hour(s))  URINE CULTURE     Status: None   Collection Time    02/16/14 12:26 PM      Result Value Ref Range Status   Specimen Description URINE, CLEAN CATCH   Final   Special Requests NONE   Final   Culture  Setup Time     Final   Value: 02/16/2014 18:46     Performed at [facility] Count     Final   Value: NO GROWTH     Performed at [facility]   Culture     Final   Value: NO GROWTH     Performed at [facility]   Report Status 02/17/2014 FINAL   Final  CULTURE, BLOOD (ROUTINE X 2)     Status: None   Collection Time    02/16/14  2:45 PM      Result Value Ref Range Status   Specimen Description BLOOD LEFT HAND  3 ML IN [facility] BOTTLE   Final   Special Requests NONE   Final   Culture  Setup Time     Final   Value: 02/16/2014 18:20     Performed at [facility]   Culture     Final   Value: STREPTOCOCCUS PNEUMONIAE     Note: Gram Stain Report Called to,Read Back By and Verified With: Sumner Boast 02/17/14 @ 1:25PM BY RUSCOE A     Performed at [facility]   Report Status 02/19/2014 FINAL   Final   Organism ID, Bacteria STREPTOCOCCUS PNEUMONIAE   Final  CULTURE, BLOOD (ROUTINE X 2)     Status: None   Collection Time    02/16/14  3:00 PM      Result Value Ref Range Status   Specimen Description BLOOD RIGHT ARM  5 ML IN [facility] BOTTLE   Final   Special Requests NONE   Final   Culture  Setup Time     Final   Value: 02/16/2014 18:20     Performed at [facility]   Culture     Final   Value: STREPTOCOCCUS  PNEUMONIAE     Note: SUSCEPTIBILITIES PERFORMED  ON PREVIOUS CULTURE WITHIN THE LAST 5 DAYS.     Note: Gram Stain Report Called to,Read Back By and Verified With: Sumner Boast 02/17/14 @ 1:25PM BY RUSCOE A.     Performed at [facility]   Report Status 02/19/2014 FINAL   Final  MRSA PCR SCREENING     Status: None   Collection Time    02/16/14  3:39 PM      Result Value Ref Range Status   MRSA by PCR NEGATIVE  NEGATIVE Final   Comment:            The GeneXpert MRSA Assay (FDA     approved for NASAL specimens     only), is one component of a     comprehensive MRSA colonization     surveillance program. It is not     intended to diagnose MRSA     infection nor to guide or     monitor treatment for     MRSA infections.  CULTURE, EXPECTORATED SPUTUM-ASSESSMENT     Status: None   Collection Time    02/16/14  4:23 PM      Result Value Ref Range Status   Specimen Description SPUTUM   Final   Special Requests NONE   Final   Sputum evaluation     Final   Value: THIS SPECIMEN IS ACCEPTABLE. RESPIRATORY CULTURE REPORT TO FOLLOW.   Report Status 02/16/2014 FINAL   Final  CULTURE, RESPIRATORY (NON-EXPECTORATED)     Status: None   Collection Time    02/16/14  4:23 PM      Result Value Ref Range Status   Specimen Description SPUTUM   Final   Special Requests NONE   Final   Gram Stain     Final   Value: MODERATE WBC PRESENT, PREDOMINANTLY PMN     FEW SQUAMOUS EPITHELIAL CELLS PRESENT     ABUNDANT GRAM POSITIVE COCCI IN PAIRS     IN CHAINS FEW Y     Performed at [facility]   Culture     Final   Value: MODERATE STREPTOCOCCUS PNEUMONIAE     Performed at [facility]   Report Status 02/19/2014 FINAL   Final   Organism ID, Bacteria STREPTOCOCCUS PNEUMONIAE   Final  CULTURE, BLOOD (ROUTINE X 2)     Status: None   Collection Time    02/22/14 10:40 AM      Result Value Ref Range Status   Specimen Description BLOOD RIGHT ARM   Final   Special Requests BOTTLES  DRAWN AEROBIC AND ANAEROBIC 10 CC   Final   Culture  Setup Time     Final   Value: 02/22/2014 12:24     Performed at [facility]   Culture     Final   Value:        BLOOD CULTURE RECEIVED NO GROWTH TO DATE CULTURE WILL BE HELD FOR 5 DAYS BEFORE ISSUING A FINAL NEGATIVE REPORT     Performed at [facility]   Report Status PENDING   Incomplete  CULTURE, BLOOD (ROUTINE X 2)     Status: None   Collection Time    02/22/14 10:41 AM      Result Value Ref Range Status   Specimen Description BLOOD LEFT HAND   Final   Special Requests BOTTLES DRAWN AEROBIC AND ANAEROBIC 5 CC   Final   Culture  Setup Time     Final   Value: 02/22/2014 12:24  Performed at [facility]     Final   Value:        BLOOD CULTURE RECEIVED NO GROWTH TO DATE CULTURE WILL BE HELD FOR 5 DAYS BEFORE ISSUING A FINAL NEGATIVE REPORT     Performed at [facility]   Report Status PENDING   Incomplete     Studies: Dg Chest 2 View  02/22/2014   CLINICAL DATA:  Pneumonia  EXAM: CHEST  2 VIEW  COMPARISON:  02/21/2014  FINDINGS: Cardiac shadow is stable. Bilateral infiltrative changes are again identified and given some technical variations in the film relatively stable. No sizable effusion is seen. No acute bony abnormality is noted.  IMPRESSION: Diffuse bilateral infiltrates. The overall appearance is stable given some technical variations in the films.   Electronically Signed   By: Alcide Clever M.D.   On: 02/22/2014 08:16    Scheduled Meds: . dextromethorphan-guaiFENesin  1 tablet Oral BID  . enoxaparin (LOVENOX) injection  40 mg Subcutaneous Q24H  . escitalopram  10 mg Oral Daily  . fluconazole  100 mg Oral Daily  . ipratropium-albuterol  3 mL Nebulization TID  . levofloxacin  750 mg Oral Daily  . potassium chloride  40 mEq Oral TID   Continuous Infusions:    Principal Problem:   Sepsis Active Problems:   PNA (pneumonia)   Sepsis associated hypotension   Hypokalemia    Bacteremia   Chronic diastolic heart failure   Pneumococcal lobar pneumonia   Pneumococcal bacteremia   Acute lung injury    Time spent: 35 minutes    [facility]  [facility] Pager [phone] If 7PM-7AM, please contact night-coverage at [email or web address], password [facility] 02/23/2014, 2:01 PM  LOS: 7 days

## 2014-02-23 NOTE — Progress Notes (Signed)
SATURATION QUALIFICATIONS: (This note is used to comply with regulatory documentation for home oxygen)  Patient Saturations on Room Air at Rest = 70%  Patient Saturations on Room Air while Ambulating = 63%  Patient Saturations on 2Liters of oxygen while Ambulating = 87%  Please briefly explain why patient needs home oxygen:de sats off o2 even at rest, needs o2 to ambulate

## 2014-02-24 ENCOUNTER — Inpatient Hospital Stay (HOSPITAL_COMMUNITY): Payer: Managed Care, Other (non HMO)

## 2014-02-24 DIAGNOSIS — E876 Hypokalemia: Secondary | ICD-10-CM

## 2014-02-24 DIAGNOSIS — A403 Sepsis due to Streptococcus pneumoniae: Principal | ICD-10-CM

## 2014-02-24 LAB — BASIC METABOLIC PANEL
BUN: 10 mg/dL (ref 6–23)
CHLORIDE: 87 meq/L — AB (ref 96–112)
CO2: 38 mEq/L — ABNORMAL HIGH (ref 19–32)
Calcium: 8.9 mg/dL (ref 8.4–10.5)
Creatinine, Ser: 0.6 mg/dL (ref 0.50–1.10)
GFR calc non Af Amer: >90 mL/min (ref >90)
Glucose, Bld: 100 mg/dL — ABNORMAL HIGH (ref 70–99)
POTASSIUM: 4.3 meq/L (ref 3.7–5.3)
Sodium: 134 mEq/L — ABNORMAL LOW (ref 137–147)

## 2014-02-24 LAB — PROCALCITONIN: Procalcitonin: 0.16 ng/mL

## 2014-02-24 LAB — MAGNESIUM: Magnesium: 1.6 mg/dL (ref 1.5–2.5)

## 2014-02-24 LAB — PHOSPHORUS: PHOSPHORUS: 3.3 mg/dL (ref 2.3–4.6)

## 2014-02-24 MED ORDER — FLUCONAZOLE 100 MG PO TABS: 100.0000 mg | ORAL_TABLET | Freq: Every day | ORAL | Status: DC

## 2014-02-24 MED ORDER — IPRATROPIUM-ALBUTEROL 0.5-2.5 (3) MG/3ML IN SOLN: 3.0000 mL | Freq: Three times a day (TID) | RESPIRATORY_TRACT | Status: DC

## 2014-02-24 MED ORDER — LEVOFLOXACIN 750 MG PO TABS: 750.0000 mg | ORAL_TABLET | Freq: Every day | ORAL | Status: DC

## 2014-02-24 MED ORDER — HYDROXYZINE HCL 50 MG PO TABS: 50.0000 mg | ORAL_TABLET | Freq: Four times a day (QID) | ORAL | Status: DC | PRN

## 2014-02-24 MED ORDER — DM-GUAIFENESIN ER 30-600 MG PO TB12: 1.0000 | ORAL_TABLET | Freq: Two times a day (BID) | ORAL | Status: DC

## 2014-02-24 MED ORDER — FUROSEMIDE 40 MG PO TABS: 40.0000 mg | ORAL_TABLET | Freq: Every day | ORAL | Status: DC

## 2014-02-24 MED ORDER — POTASSIUM CHLORIDE ER 10 MEQ PO TBCR: 10.0000 meq | EXTENDED_RELEASE_TABLET | Freq: Every day | ORAL | Status: DC

## 2014-02-24 MED ORDER — FUROSEMIDE 20 MG PO TABS: 20.0000 mg | ORAL_TABLET | Freq: Every day | ORAL | Status: DC

## 2014-02-24 NOTE — Discharge Summary (Addendum)
Physician Discharge Summary  Kimberly Shelton YSA:[id number] DOB: [birth year] DOA: 02/16/2014  PCP: Kristian Covey, MD  Admit date: 02/16/2014 Discharge date: 02/24/2014  Time spent: 55* minutes  Recommendations for Outpatient Follow-up:  1. *Follow up PCP in one week 2. Follow up Pulmonary Dr [name]  In one week  Discharge Diagnoses:  Principal Problem:   Sepsis Active Problems:   PNA (pneumonia)   Sepsis associated hypotension   Hypokalemia   Bacteremia   Chronic diastolic heart failure   Pneumococcal lobar pneumonia   Pneumococcal bacteremia   Acute lung injury   Discharge Condition: Stable  Diet recommendation: low salt diet  Filed Weights   02/22/14 0512 02/23/14 0515 02/24/14 0448  Weight: 58.4 kg (128 lb 12 oz) 56.8 kg (125 lb 3.5 oz) 56.5 kg (124 lb 9 oz)    History of present illness:  53 yo female who presented to [facility] ED with main concern of several days duration of progressively worsening shortness of breath, associated with productive cough of yellow sputum, malaise, poor oral intake, worse with exertion and occasionally improved with rest. PT explains she was seen by her PCP 5 days PTA and was treated with Z-Pack with no significant improvement in her symptoms. She denies similar events in the past, no known recent sick contacts or exposures.  In ED, pt noted to have SBP in 80-90's but said she typically has low BP in 90's/50's at home. Pt also note to have HR in 110 - 120's with RR in high 20's. CXR c/w PNA and TRH asked to admit to SDU for further evlauation.      Hospital Course:   . Sepsis  Patient was admitted with sepsis started on empiric antibiotics. Later blood cultures grew strep pneumo and patient was switched to Levaquin. Sepsis has resolved at this time. She also was started on Rocephin per pulmonary for 2 more days for double coverage.  2. Bacteremia  - Growing strep pneumo in the blood cultures. Sensitive to levaquin.  - Echocardiogram  negative for vegetations at valves.  - also started on rocephin IV for 48 hrs by pulmonary  -Patient has received 8days of antibiotics in the hospital. She'll be sent home on Levaquin some 50 mg by mouth daily for 6 more days to complete 14 days course of antibiotic therapy.  3 .Pneumonia  - Chest x-ray shows much improvement, called and discussed with Dr. Molli Knock, and he recommends to send the patient home on oxygen as she still getting hypoxic on exertion. Patient will follow with Dr. [name] in 1 week Will arrange for home health PT has a home health RN. Patient will also need DuoNeb nebulizers Q6 hours when necessary  - Positive Strep Pneumonia urinary antigen  - Blood cx reports sensitive to levaquin.  - Also started Ceftriaxone for 48 hrs  - 4. Hypokalemia  - Resolved, secondary to Lasix - Will send home on K. Dur 10 mg by mouth daily .  5 Pulmonary edema  Patient developed pulmonary edema in the hospital -  had elevated BNP 2073. Chest x-ray showing fluid overload. Lasix 40 mg IV every 12 hours started. Patient improved with improvement in chest x-ray. Will send the patient home on Lasix 40 mg daily for next 10 days. - Echocardiogram showed grade 1 diastolic dysfunction, will start Lasix 20 g by mouth daily after she completes 10 days of Lasix 40 mg daily.  6. Rheumatoid arthritis  - Will hold Arava till the antibiotic course completed as per  pulmonary recommendation -She can start taking Arava in one week.  Oral thrush Patient has been receiving Diflucan for last 3 days, will give 3 tablets of Diflucan 100 mg by mouth daily.  Procedures:  None  Consultations:  Pulmonary  Discharge Exam: Filed Vitals:   02/24/14 0448  BP: 128/54  Pulse: 88  Temp: 97.9 F (36.6 C)  Resp: 18    General: Appears in no acute distress Chest bilateral rhonchi heart S1-S2 regular Abdomen soft nontender   Discharge Instructions You were cared for by a hospitalist during your  hospital stay. If you have any questions about your discharge medications or the care you received while you were in the hospital after you are discharged, you can call the unit and asked to speak with the hospitalist on call if the hospitalist that took care of you is not available. Once you are discharged, your primary care physician will handle any further medical issues. Please note that NO REFILLS for any discharge medications will be authorized once you are discharged, as it is imperative that you return to your primary care physician (or establish a relationship with a primary care physician if you do not have one) for your aftercare needs so that they can reassess your need for medications and monitor your lab values.  Discharge Instructions   Diet - low sodium heart healthy    Complete by:  As directed      Increase activity slowly    Complete by:  As directed             Medication List         dextromethorphan-guaiFENesin 30-600 MG per 12 hr tablet  Commonly known as:  MUCINEX DM  Take 1 tablet by mouth 2 (two) times daily.     escitalopram 10 MG tablet  Commonly known as:  LEXAPRO  Take 1 tablet (10 mg total) by mouth daily.     fluconazole 100 MG tablet  Commonly known as:  DIFLUCAN  Take 1 tablet (100 mg total) by mouth daily.     furosemide 40 MG tablet  Commonly known as:  LASIX  Take 1 tablet (40 mg total) by mouth daily.     hydrOXYzine 50 MG tablet  Commonly known as:  ATARAX/VISTARIL  Take 1 tablet (50 mg total) by mouth every 6 (six) hours as needed for anxiety.     ipratropium-albuterol 0.5-2.5 (3) MG/3ML Soln  Commonly known as:  DUONEB  Take 3 mLs by nebulization 3 (three) times daily.     leflunomide 20 MG tablet  Commonly known as:  ARAVA  Take 20 mg by mouth daily.     levofloxacin 750 MG tablet  Commonly known as:  LEVAQUIN  Take 1 tablet (750 mg total) by mouth daily.     potassium chloride 10 MEQ tablet  Commonly known as:  K-DUR  Take 1  tablet (10 mEq total) by mouth daily.     promethazine 12.5 MG tablet  Commonly known as:  PHENERGAN  Take 12.5 mg by mouth every 6 (six) hours as needed for nausea or vomiting (nausea and vomiting).     SIMPONI 50 MG/0.5ML Soln  Generic drug:  Golimumab  every 30 (thirty) days.       Allergies  Allergen Reactions  . Bee Venom   . Codeine     "seeing spots"  . Penicillins     REACTION: rash, itiching       Follow-up Information   Follow up  with Kristian Covey, MD In 2 weeks.   Specialty:  Family Medicine   Contact information:   [address] [place] [address] [phone]       Follow up with [facility], MD. Schedule an appointment as soon as possible for a visit in 1 week.   Specialty:  Pulmonary Disease   Contact information:   [address] [place] [address] [phone]        The results of significant diagnostics from this hospitalization (including imaging, microbiology, ancillary and laboratory) are listed below for reference.    Significant Diagnostic Studies: Dg Chest 2 View  02/22/2014   CLINICAL DATA:  Pneumonia  EXAM: CHEST  2 VIEW  COMPARISON:  02/21/2014  FINDINGS: Cardiac shadow is stable. Bilateral infiltrative changes are again identified and given some technical variations in the film relatively stable. No sizable effusion is seen. No acute bony abnormality is noted.  IMPRESSION: Diffuse bilateral infiltrates. The overall appearance is stable given some technical variations in the films.   Electronically Signed   By: Alcide Clever M.D.   On: 02/22/2014 08:16   Dg Chest 2 View  02/20/2014   CLINICAL DATA:  Pneumonia, cough and fever.  EXAM: CHEST  2 VIEW  COMPARISON:  February 16, 2014.  FINDINGS: Cardiomediastinal silhouette appears normal. No pneumothorax or pleural effusion is noted. Stable right upper lobe airspace opacity is noted consistent with pneumonia. Left lower lobe opacity appears slightly enlarged concerning for  pneumonia. Bony thorax is intact.  IMPRESSION: Stable right upper lobe pneumonia. Increased left lower lobe airspace opacity is noted concerning for worsening pneumonia.   Electronically Signed   By: Roque Lias M.D.   On: 02/20/2014 14:06   Dg Chest 2 View  02/16/2014   CLINICAL DATA:  Weakness and cough for 1 week. Congestion. Ex-smoker.  EXAM: CHEST  2 VIEW  COMPARISON:  None.  FINDINGS: The heart size and mediastinal contours are normal. There is dense consolidation inferiorly in the right upper lobe and posteriorly in the left lower lobe. There is no obvious hilar mass or pleural effusion. Telemetry leads overlie the chest.  IMPRESSION: Right upper and left lower lobe consolidation consistent with multi lobar pneumonia. Radiographic follow up recommended to document clearing.   Electronically Signed   By: Roxy Horseman M.D.   On: 02/16/2014 13:24   Dg Chest Port 1 View  02/21/2014   CLINICAL DATA:  Cough with known airspace disease  EXAM: PORTABLE CHEST - 1 VIEW  COMPARISON:  Portable chest x-ray of February 20, 2014  FINDINGS: There has been further interval increase in the diffuse interstitial infiltrates. Confluent density in the right upper lobe and left midlung is unchanged. The heart is normal in size. The pulmonary vascularity is obscured. There is no significant pleural effusion.  IMPRESSION: Progressive increase in the bilateral interstitial infiltrates compatible with progressive pneumonia. Stable alveolar consolidation in the right upper lobe and left midlung.   Electronically Signed   By: David  [place]   On: 02/21/2014 07:48    Microbiology: Recent Results (from the past 240 hour(s))  URINE CULTURE     Status: None   Collection Time    02/16/14 12:26 PM      Result Value Ref Range Status   Specimen Description URINE, CLEAN CATCH   Final   Special Requests NONE   Final   Culture  Setup Time     Final   Value: 02/16/2014 18:46     Performed at  [facility]   Colony Count      Final   Value: NO GROWTH     Performed at [facility]   Culture     Final   Value: NO GROWTH     Performed at [facility]   Report Status 02/17/2014 FINAL   Final  CULTURE, BLOOD (ROUTINE X 2)     Status: None   Collection Time    02/16/14  2:45 PM      Result Value Ref Range Status   Specimen Description BLOOD LEFT HAND  3 [facility]   Final   Special Requests NONE   Final   Culture  Setup Time     Final   Value: 02/16/2014 18:20     Performed at [facility]   Culture     Final   Value: STREPTOCOCCUS PNEUMONIAE     Note: Gram Stain Report Called to,Read Back By and Verified With: Sumner BoastIFEOMA ORAEGBUNAM 02/17/14 @ 1:25PM BY RUSCOE A     Performed at [facility]   Report Status 02/19/2014 FINAL   Final   Organism ID, Bacteria STREPTOCOCCUS PNEUMONIAE   Final  CULTURE, BLOOD (ROUTINE X 2)     Status: None   Collection Time    02/16/14  3:00 PM      Result Value Ref Range Status   Specimen Description BLOOD RIGHT ARM  5 [facility]   Final   Special Requests NONE   Final   Culture  Setup Time     Final   Value: 02/16/2014 18:20     Performed at [facility]   Culture     Final   Value: STREPTOCOCCUS PNEUMONIAE     Note: SUSCEPTIBILITIES PERFORMED ON PREVIOUS CULTURE WITHIN THE LAST 5 DAYS.     Note: Gram Stain Report Called to,Read Back By and Verified With: Sumner BoastIFEOMA ORAEGBUNAM 02/17/14 @ 1:25PM BY RUSCOE A.     Performed at [facility]   Report Status 02/19/2014 FINAL   Final  MRSA PCR SCREENING     Status: None   Collection Time    02/16/14  3:39 PM      Result Value Ref Range Status   MRSA by PCR NEGATIVE  NEGATIVE Final   Comment:            The GeneXpert MRSA Assay (FDA     approved for NASAL specimens     only), is one component of a     comprehensive MRSA colonization     surveillance program. It is not     intended to diagnose MRSA     infection nor to guide or     monitor treatment for     MRSA  infections.  CULTURE, EXPECTORATED SPUTUM-ASSESSMENT     Status: None   Collection Time    02/16/14  4:23 PM      Result Value Ref Range Status   Specimen Description SPUTUM   Final   Special Requests NONE   Final   Sputum evaluation     Final   Value: THIS SPECIMEN IS ACCEPTABLE. RESPIRATORY CULTURE REPORT TO FOLLOW.   Report Status 02/16/2014 FINAL   Final  CULTURE, RESPIRATORY (NON-EXPECTORATED)     Status: None   Collection Time    02/16/14  4:23 PM      Result Value Ref Range Status   Specimen Description SPUTUM   Final   Special Requests NONE  Final   Gram Stain     Final   Value: MODERATE WBC PRESENT, PREDOMINANTLY PMN     FEW SQUAMOUS EPITHELIAL CELLS PRESENT     ABUNDANT GRAM POSITIVE COCCI IN PAIRS     IN CHAINS FEW Y     Performed at [facility]   Culture     Final   Value: MODERATE STREPTOCOCCUS PNEUMONIAE     Performed at [facility]   Report Status 02/19/2014 FINAL   Final   Organism ID, Bacteria STREPTOCOCCUS PNEUMONIAE   Final  CULTURE, BLOOD (ROUTINE X 2)     Status: None   Collection Time    02/22/14 10:40 AM      Result Value Ref Range Status   Specimen Description BLOOD RIGHT ARM   Final   Special Requests BOTTLES DRAWN AEROBIC AND ANAEROBIC 10 CC   Final   Culture  Setup Time     Final   Value: 02/22/2014 12:24     Performed at [facility]   Culture     Final   Value:        BLOOD CULTURE RECEIVED NO GROWTH TO DATE CULTURE WILL BE HELD FOR 5 DAYS BEFORE ISSUING A FINAL NEGATIVE REPORT     Performed at [facility]   Report Status PENDING   Incomplete  CULTURE, BLOOD (ROUTINE X 2)     Status: None   Collection Time    02/22/14 10:41 AM      Result Value Ref Range Status   Specimen Description BLOOD LEFT HAND   Final   Special Requests BOTTLES DRAWN AEROBIC AND ANAEROBIC 5 CC   Final   Culture  Setup Time     Final   Value: 02/22/2014 12:24     Performed at [facility]   Culture     Final   Value:         BLOOD CULTURE RECEIVED NO GROWTH TO DATE CULTURE WILL BE HELD FOR 5 DAYS BEFORE ISSUING A FINAL NEGATIVE REPORT     Performed at [facility]   Report Status PENDING   Incomplete     Labs: Basic Metabolic Panel:  Recent Labs Lab 02/20/14 0345 02/21/14 0426 02/22/14 0338 02/23/14 0403 02/24/14 0523  NA 135* 138 136* 137 134*  K 3.6* 3.0* 3.1* 3.1* 4.3  CL 97 95* 88* 88* 87*  CO2 26 32 33* 37* 38*  GLUCOSE 90 87 97 95 100*  BUN 14 14 12 13 10   CREATININE 0.70 0.68 0.62 0.58 0.60  CALCIUM 8.1* 7.9* 8.0* 8.4 8.9  MG  --   --  1.2* 1.3* 1.6  PHOS  --   --  3.0 3.1 3.3   Liver Function Tests: No results found for this basename: AST, ALT, ALKPHOS, BILITOT, PROT, ALBUMIN,  in the last 168 hours No results found for this basename: LIPASE, AMYLASE,  in the last 168 hours No results found for this basename: AMMONIA,  in the last 168 hours CBC:  Recent Labs Lab 02/19/14 0428 02/20/14 0345 02/21/14 0426 02/22/14 0338  WBC 14.6* 12.2* 10.5 11.7*  HGB 10.7* 10.9* 10.3* 10.4*  HCT 31.5* 32.9* 30.5* 31.5*  MCV 85.8 86.8 86.4 87.3  PLT 199 224 228 254   Cardiac Enzymes: No results found for this basename: CKTOTAL, CKMB, CKMBINDEX, TROPONINI,  in the last 168 hours BNP: BNP (last 3 results)  Recent Labs  02/20/14 0350 02/22/14 0338  PROBNP 2073.0* 1456.0*  CBG: No results found for this basename: GLUCAP,  in the last 168 hours     Signed:  Kiaria Quinnell S  [facility] 02/24/2014, 9:46 AM

## 2014-02-24 NOTE — Progress Notes (Signed)
CARE MANAGEMENT NOTE 02/24/2014  Patient:  Kimberly Shelton, Kimberly Shelton   Account Number:  [id number]  Date Initiated:  02/18/2014  Documentation initiated by:  Ezekiel Ina  Subjective/Objective Assessment:   Pt admitted with SOB, weakness     Action/Plan:   from home   Anticipated DC Date:  02/20/2014   Anticipated DC Plan:  HOME/SELF CARE      DC Planning Services  CM consult      [facility] Choice  HOME HEALTH   Choice offered to / List presented to:  C-1 Patient   DME arranged  OXYGEN  NEBULIZER MACHINE      DME agency  [facility]     HH arranged  HH-1 RN  HH-2 PT      [facility] agency  [facility]   Status of service:  Completed, signed off Medicare Important Message given?   (If response is "NO", the following Medicare IM given date fields will be blank) Date Medicare IM given:   Date Additional Medicare IM given:    Discharge Disposition:  HOME W HOME HEALTH SERVICES  Per UR Regulation:  Reviewed for med. necessity/level of care/duration of stay  If discussed at Long Length of Stay Meetings, dates discussed:    Comments:  02/24/2014 0930 NCM spoke to pt and offered choice for [facility]. Pt requested [facility] for HH. Explained [facility] will bring portable tank to her room and will deliver concentrator to her home today. Pt states she quit smoking. Notified [facility] for [facility] and DME for scheduled dc home today. Isidoro Donning RN CCM Case Mgmt phone [phone]  02/18/14 MMcGibboney, RN, BSN Chart reviewed.

## 2014-02-25 ENCOUNTER — Telehealth: Payer: Self-pay | Admitting: Family Medicine

## 2014-02-25 ENCOUNTER — Telehealth: Payer: Self-pay | Admitting: Internal Medicine

## 2014-02-25 NOTE — Telephone Encounter (Signed)
She can be booked into see another MD; She seemed frail. So, I think another MD should be able to cross cover. LEt me know who you book with  And I will d.w that MD as heads up

## 2014-02-25 NOTE — Telephone Encounter (Signed)
LMTCB on Emergency contact number as he called our office.

## 2014-02-25 NOTE — Telephone Encounter (Signed)
Please overbook towards end of day

## 2014-02-25 NOTE — Telephone Encounter (Signed)
OK 

## 2014-02-25 NOTE — Telephone Encounter (Signed)
appt sch °

## 2014-02-25 NOTE — Telephone Encounter (Signed)
#  1 - thanks  #2 - agree with hold lasix but they should check bp regularly atleast 2 times daily  #3 STD - Yes, I can handle it

## 2014-02-25 NOTE — Telephone Encounter (Signed)
Recommendations for Outpatient Follow-up:  1. *Follow up PCP in one week 2. Follow up Pulmonary Dr [name] In one week  TP nor MR has anything available. Please advise MR thanks

## 2014-02-25 NOTE — Telephone Encounter (Signed)
Pt would like to know if you will accept her back as apt? Pt not seen in over 7-8 yrs.  pt's husband sees you. Theora Master. Pt was just dc from hospital for double pneum.  Pt needs appt this week w/ you

## 2014-02-25 NOTE — Telephone Encounter (Signed)
Called, spoke with pt's husband -  1.  We have scheduled pt to see MR on June 29 at 3:30 pm.  Husband aware. 2.  Husband would like MR to be aware pt's BP dropped to 90/60s today when setting up with home health nurse.  Per Husband, BP is normally 105/60.  Reports Nurse spoke with Dr. Sharl Ma who advised to hold lasix and K x 3 days.  FYI for MR. 3.  Husband states he called pt's insurance today to file STD claim.  Pt has already been out of work x 3 wks and is unsure when she will be able to return.  Would like to know if MR will complete forms for STD.  If MR is ok with this, he is aware forms will need to be handled through [employer].  Please advise.  Thank you.

## 2014-02-25 NOTE — Telephone Encounter (Signed)
No other MD or TP have an opening in a wk  You are here on 6/29 but are already overbooked once  Do you want [place] to overbook you again  Please advise

## 2014-02-25 NOTE — Telephone Encounter (Signed)
Called, spoke with pt -  He is aware of below recs per MR regarding lasix and bp.  Reports pt has an appt with PCP on June 25. He is aware MR is ok with handling STD forms.  He was provided [employer] # to call to start this process. He verbalized understanding of instructions and voiced no further questions or concerns at this time.

## 2014-02-28 ENCOUNTER — Telehealth: Payer: Self-pay | Admitting: Family Medicine

## 2014-02-28 ENCOUNTER — Ambulatory Visit (INDEPENDENT_AMBULATORY_CARE_PROVIDER_SITE_OTHER): Payer: Managed Care, Other (non HMO) | Admitting: Family Medicine

## 2014-02-28 ENCOUNTER — Encounter: Payer: Self-pay | Admitting: Family Medicine

## 2014-02-28 ENCOUNTER — Telehealth: Payer: Self-pay

## 2014-02-28 VITALS — BP 110/64 | HR 104 | Wt 122.0 lb

## 2014-02-28 DIAGNOSIS — R7881 Bacteremia: Secondary | ICD-10-CM

## 2014-02-28 DIAGNOSIS — B954 Other streptococcus as the cause of diseases classified elsewhere: Secondary | ICD-10-CM

## 2014-02-28 DIAGNOSIS — J13 Pneumonia due to Streptococcus pneumoniae: Secondary | ICD-10-CM

## 2014-02-28 LAB — CBC WITH DIFFERENTIAL/PLATELET
Basophils Absolute: 0.1 10*3/uL (ref 0.0–0.1)
Basophils Relative: 1.3 % (ref 0.0–3.0)
EOS ABS: 0 10*3/uL (ref 0.0–0.7)
Eosinophils Relative: 0.3 % (ref 0.0–5.0)
HCT: 36.2 % (ref 36.0–46.0)
HEMOGLOBIN: 11.9 g/dL — AB (ref 12.0–15.0)
LYMPHS PCT: 9.7 % — AB (ref 12.0–46.0)
Lymphs Abs: 1.1 10*3/uL (ref 0.7–4.0)
MCHC: 33 g/dL (ref 30.0–36.0)
MCV: 89.4 fl (ref 78.0–100.0)
Monocytes Absolute: 0.7 10*3/uL (ref 0.1–1.0)
Monocytes Relative: 5.8 % (ref 3.0–12.0)
NEUTROS ABS: 9.4 10*3/uL — AB (ref 1.4–7.7)
Neutrophils Relative %: 82.9 % — ABNORMAL HIGH (ref 43.0–77.0)
Platelets: 423 10*3/uL — ABNORMAL HIGH (ref 150.0–400.0)
RBC: 4.05 Mil/uL (ref 3.87–5.11)
RDW: 14 % (ref 11.5–15.5)
WBC: 11.4 10*3/uL — ABNORMAL HIGH (ref 4.0–10.5)

## 2014-02-28 LAB — CULTURE, BLOOD (ROUTINE X 2)
CULTURE: NO GROWTH
Culture: NO GROWTH

## 2014-02-28 LAB — BASIC METABOLIC PANEL
BUN: 10 mg/dL (ref 6–23)
CHLORIDE: 95 meq/L — AB (ref 96–112)
CO2: 33 meq/L — AB (ref 19–32)
Calcium: 9.4 mg/dL (ref 8.4–10.5)
Creatinine, Ser: 0.7 mg/dL (ref 0.4–1.2)
GFR: 95.73 mL/min (ref >60.00)
Glucose, Bld: 87 mg/dL (ref 70–99)
Potassium: 6.2 mEq/L (ref 3.5–5.1)
SODIUM: 137 meq/L (ref 135–145)

## 2014-02-28 NOTE — Telephone Encounter (Signed)
Relevant patient education mailed to patient.  

## 2014-02-28 NOTE — Telephone Encounter (Signed)
Called pt and left Vm for the patient to return call. Pt needs to come back to get recheck on K.

## 2014-02-28 NOTE — Telephone Encounter (Signed)
Critical lab: K is 6.2

## 2014-02-28 NOTE — Patient Instructions (Signed)
Finish out antibiotic. Follow up with Pulmonologist as scheduled Follow up immediately for any fever or increased shortness of breath. Continue oxygen until follow up with Dr [name]

## 2014-02-28 NOTE — Progress Notes (Signed)
Pre visit review using our clinic review tool, if applicable. No additional management support is needed unless otherwise documented below in the visit note. 

## 2014-02-28 NOTE — Progress Notes (Signed)
Subjective:    Patient ID: Kimberly Shelton, female    DOB: 1961-06-15, 53 y.o.   MRN: [id number]  HPI Hospital followup. Patient has history of rheumatoid arthritis and not been seen in our clinic for about 5 years. She was admitted on June 13 and discharged June 21. She was hospitalized for pneumonia with sepsis. She had presented with nonspecific symptoms of shortness of breath, productive cough, and fatigue. She had been seen at another primary care clinic and started on Zithromax prior to admission but was not improving.  She presented to emergency department and x-rays revealed right upper lobe and left lower lobe pneumonia. She was started on empiric antibiotics and later blood cultures grew out strep pneumonia. She was treated with Rocephin and Levaquin and discharged on Levaquin. Echocardiogram revealed grade 1 diastolic dysfunction. No vegetations.  Patient is a long-term smoker but no history of known COPD. She was discharged on home oxygen and nebulizers. Overall she is improving and states is about 50% improved. She is using oxygen continuously. No recurrent fever. Still has decreased appetite but starting to slowly improve. Good fluid intake.  Patient had some volume overload during hospitalization which improved with Lasix. Her BNP was over 2000 at one point. She also had some oral thrush which was treated with fluconazole and improved. No dysphagia. No odynophagia.  Past Medical History  Diagnosis Date  . Abnormal Pap smear 09/2006    HGSIL CIN 2/VAIN/ CIN 3/VAIN-3 CIS  . RA (rheumatoid arthritis) 07/2009   Past Surgical History  Procedure Laterality Date  . Cervical biopsy  w/ loop electrode excision  2008    CIN 3  . Pilonidal cyst excision  1999    I&D    reports that she has been smoking Cigarettes.  She has been smoking about 1.00 pack per day. She has never used smokeless tobacco. She reports that she drinks about .5 ounces of alcohol per week. She reports that she does  not use illicit drugs. family history includes Bronchitis in her father; COPD in her father; Cancer in her brother; Diabetes in her father and mother; Rheum arthritis in her mother. Allergies  Allergen Reactions  . Bee Venom   . Codeine     "seeing spots"  . Penicillins     REACTION: rash, itiching      Review of Systems  Constitutional: Positive for fatigue. Negative for fever and chills.  HENT: Negative for congestion.   Respiratory: Positive for cough, shortness of breath and wheezing.   Cardiovascular: Negative for chest pain, palpitations and leg swelling.  Gastrointestinal: Negative for nausea and vomiting.       Objective:   Physical Exam  Constitutional: She appears well-developed and well-nourished.  HENT:  Mouth/Throat: Oropharynx is clear and moist.  No evidence for thrush  Neck: Neck supple. No thyromegaly present.  Cardiovascular: Normal rate.   Pulmonary/Chest:  Patient has diminished breath sounds left lower lobe and somewhat right upper lobe compared to elsewhere. She has some faint diffuse wheezes. No retractions  Musculoskeletal: She exhibits no edema.  Neurological: She is alert.          Assessment & Plan:  Recent admission for right upper lobe left lower lobe strep pneumonia with bacteremia and probable sepsis. Clinically improving. Chest x-ray prior to discharge did show concern for possible cavitation right upper lobe lesion. She has followup with pulmonary this coming Monday. Check CBC and basic metabolic panel. Continue oxygen until followup with pulmonary.  Finish out  Levaquin

## 2014-03-01 ENCOUNTER — Other Ambulatory Visit: Payer: Self-pay | Admitting: Family Medicine

## 2014-03-01 ENCOUNTER — Other Ambulatory Visit (INDEPENDENT_AMBULATORY_CARE_PROVIDER_SITE_OTHER): Payer: Managed Care, Other (non HMO)

## 2014-03-01 DIAGNOSIS — E875 Hyperkalemia: Secondary | ICD-10-CM

## 2014-03-01 LAB — BASIC METABOLIC PANEL
BUN: 10 mg/dL (ref 6–23)
CHLORIDE: 94 meq/L — AB (ref 96–112)
CO2: 32 mEq/L (ref 19–32)
CREATININE: 0.6 mg/dL (ref 0.4–1.2)
Calcium: 8.6 mg/dL (ref 8.4–10.5)
GFR: 106.49 mL/min (ref >60.00)
Glucose, Bld: 125 mg/dL — ABNORMAL HIGH (ref 70–99)
Potassium: 4.6 mEq/L (ref 3.5–5.1)
Sodium: 134 mEq/L — ABNORMAL LOW (ref 135–145)

## 2014-03-01 NOTE — Telephone Encounter (Signed)
Pt coming into the office to get lab rechecked.

## 2014-03-04 ENCOUNTER — Encounter: Payer: Self-pay | Admitting: Internal Medicine

## 2014-03-04 ENCOUNTER — Ambulatory Visit (INDEPENDENT_AMBULATORY_CARE_PROVIDER_SITE_OTHER)
Admission: RE | Admit: 2014-03-04 | Discharge: 2014-03-04 | Disposition: A | Discharge status: Home / Self Care | Payer: Managed Care, Other (non HMO) | Source: Ambulatory Visit | Attending: Internal Medicine | Admitting: Internal Medicine

## 2014-03-04 ENCOUNTER — Telehealth: Payer: Self-pay | Admitting: Family Medicine

## 2014-03-04 ENCOUNTER — Ambulatory Visit (INDEPENDENT_AMBULATORY_CARE_PROVIDER_SITE_OTHER): Payer: Managed Care, Other (non HMO) | Admitting: Internal Medicine

## 2014-03-04 ENCOUNTER — Encounter: Payer: Self-pay | Admitting: Emergency Medicine

## 2014-03-04 VITALS — BP 112/64 | HR 111 | Ht 64.0 in | Wt 124.0 lb

## 2014-03-04 DIAGNOSIS — I5032 Chronic diastolic (congestive) heart failure: Secondary | ICD-10-CM

## 2014-03-04 DIAGNOSIS — J13 Pneumonia due to Streptococcus pneumoniae: Secondary | ICD-10-CM

## 2014-03-04 DIAGNOSIS — R7881 Bacteremia: Secondary | ICD-10-CM

## 2014-03-04 DIAGNOSIS — B954 Other streptococcus as the cause of diseases classified elsewhere: Secondary | ICD-10-CM

## 2014-03-04 NOTE — Telephone Encounter (Signed)
Pt hus would like wife blood work results

## 2014-03-04 NOTE — Patient Instructions (Addendum)
#  Pneumococcal pneumonia  - glad you are better  - continue duoneb per schedule  - do CXR 2 view today; will call with results   #significant tachycardia  - refer cardiology  - Dr Jacinto Halim or anyone who can see her soon   #FOllowup Full PFT in 4 weeks Return to see my NP Tammy in 4 weeks

## 2014-03-04 NOTE — Progress Notes (Signed)
Subjective:    Patient ID: Kimberly Shelton, female    DOB: 11/15/1960, 53 y.o.   MRN: [id number]  HPI   OV 03/04/2014  Chief Complaint  Patient presents with  . Follow-up    HFU-D/c from [facility]. Pt states she has chest soreness from coughing. Pt states with the O2 her breathing has improved. C/o dyspnea when working with PT and cough with yellow mucous.    Admitted 02/16/2014 through 02/24/2014 with right-sided pneumococcal pneumonia with cavitation in the setting of smoking and rheumatoid arthritis on immune modulators. At this point in time she is off the immunomodulators. Dr. Dierdre Forth her rheumatologist is aware of this. She is 25% back to her baseline but still very fatigued and short of breath. She coughs a lot with home physical therapy. She brings up green mucus and sputum with her cough needed overall cough is significantly improved since discharge. Appetite is slowly returning. 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 She is requesting to stay off work for several weeks. On 2 L there is documentation that when she walks 2 minutes up saturation still stays at 95%.   Today in the office pon room air at rest t it is 93%. Walked 40 feet and desaturated to 89% but HR 150/min per tech: I think this is all deconditioning but not sure if she has cardiomyopathy from her illness    has a past medical history of Abnormal Pap smear (09/2006) and RA (rheumatoid arthritis) (07/2009).   has past surgical history that includes Cervical biopsy w/ loop electrode excision (2008) and Pilonidal cyst excision (1999).  Current outpatient prescriptions:dextromethorphan-guaiFENesin (MUCINEX DM) 30-600 MG per 12 hr tablet, Take 1 tablet by mouth 2 (two) times daily., Disp: 10 tablet, Rfl: 0;  escitalopram (LEXAPRO) 10 MG tablet, Take 1 tablet (10 mg total) by mouth daily., Disp: 90 tablet, Rfl: 3;  hydrOXYzine (ATARAX/VISTARIL) 50 MG tablet, Take  1 tablet (50 mg total) by mouth every 6 (six) hours as needed for anxiety., Disp: 30 tablet, Rfl: 0 ipratropium-albuterol (DUONEB) 0.5-2.5 (3) MG/3ML SOLN, Take 3 mLs by nebulization 3 (three) times daily., Disp: 360 mL, Rfl: 1;  naproxen sodium (ANAPROX) 220 MG tablet, Take 220 mg by mouth 2 (two) times daily as needed., Disp: , Rfl: ;  leflunomide (ARAVA) 20 MG tablet, Take 20 mg by mouth daily. , Disp: , Rfl:  promethazine (PHENERGAN) 12.5 MG tablet, Take 12.5 mg by mouth every 6 (six) hours as needed for nausea or vomiting (nausea and vomiting)., Disp: , Rfl: ;  SIMPONI 50 MG/0.5ML SOLN, every 30 (thirty) days. , Disp: , Rfl:   Review of Systems  Constitutional: Negative for fever and unexpected weight change.  HENT: Negative for congestion, dental problem, ear pain, nosebleeds, postnasal drip, rhinorrhea, sinus pressure, sneezing, sore throat and trouble swallowing.   Eyes: Negative for redness and itching.  Respiratory: Positive for cough and shortness of breath. Negative for chest tightness and wheezing.   Cardiovascular: Negative for palpitations and leg swelling.  Gastrointestinal: Positive for nausea. Negative for vomiting.  Genitourinary: Negative for dysuria.  Musculoskeletal: Negative for joint swelling.  Skin: Negative for rash.  Neurological: Negative for headaches.  Hematological: Does not bruise/bleed easily.  Psychiatric/Behavioral: Negative for dysphoric mood. The patient is not nervous/anxious.        Objective:   Physical Exam  Vitals reviewed. Constitutional: She is oriented to person, place,  and time. She appears well-developed and well-nourished. No distress.  Body mass index is 21.27 kg/(m^2).   HENT:  Head: Normocephalic and atraumatic.  Right Ear: External ear normal.  Left Ear: External ear normal.  Mouth/Throat: Oropharynx is clear and moist. No oropharyngeal exudate.  Eyes: Conjunctivae and EOM are normal. Pupils are equal, round, and reactive to light.  Right eye exhibits no discharge. Left eye exhibits no discharge. No scleral icterus.  Neck: Normal range of motion. Neck supple. No JVD present. No tracheal deviation present. No thyromegaly present.  Cardiovascular: Normal rate, regular rhythm, normal heart sounds and intact distal pulses.  Exam reveals no gallop and no friction rub.   No murmur heard. Pulmonary/Chest: Effort normal and breath sounds normal. No respiratory distress. She has no wheezes. She has no rales. She exhibits no tenderness.  barrell chest  Abdominal: Soft. Bowel sounds are normal. She exhibits no distension and no mass. There is no tenderness. There is no rebound and no guarding.  Musculoskeletal: Normal range of motion. She exhibits no edema and no tenderness.  Lymphadenopathy:    She has no cervical adenopathy.  Neurological: She is alert and oriented to person, place, and time. She has normal reflexes. No cranial nerve deficit. She exhibits normal muscle tone. Coordination normal.  Skin: Skin is warm and dry. No rash noted. She is not diaphoretic. No erythema. No pallor.  Psychiatric: She has a normal mood and affect. Her behavior is normal. Judgment and thought content normal.    Filed Vitals:   03/04/14 1559  BP: 112/64  Pulse: 111  Height: 5\' 4"  (1.626 m)  Weight: 124 lb (56.246 kg)  SpO2: 97%         Assessment & Plan:  #Pneumococcal pneumonia  - glad you are better - still need to use o2 with exertion  - continue duoneb per schedule  - do CXR 2 view today; will call with results -> CXR later showd cavitation; will get CT chest - continue to stay off immunomodulators of RA  #significant tachycardia - likely due to deconditioning but need to ensure there is no cardiomyopathy  - refer cardiology  - Dr or anyone who can see her soon   #FOllowup Full PFT in 4 weeks Return to see my NP Tammy in 4 weeks

## 2014-03-04 NOTE — Telephone Encounter (Signed)
Pt informed

## 2014-03-05 ENCOUNTER — Telehealth: Payer: Self-pay | Admitting: Internal Medicine

## 2014-03-05 DIAGNOSIS — J984 Other disorders of lung: Principal | ICD-10-CM

## 2014-03-05 DIAGNOSIS — J189 Pneumonia, unspecified organism: Secondary | ICD-10-CM

## 2014-03-05 NOTE — Telephone Encounter (Signed)
Patient is having Kimberly Shelton worsening cavitation of lung  On cxr even thought she is feeling better. I hjave ordered CT chest to be done. Please ensure done next few to several days with fu with Tammy Parret or me next week   Thanks Dr. Kalman Shan, M.D., [facility].C.P Pulmonary and Critical Care Medicine Staff Physician [facility]  Pulmonary and Critical Care Pager: [phone], If no answer or between  15:00h - 7:00h: call [phone]  03/05/2014 9:09 AM    Dg Chest 2 View  03/05/2014   CLINICAL DATA:  Pneumonia, cough, shortness of breath, former smoking history  EXAM: CHEST  2 VIEW  COMPARISON:  Chest x-ray of 02/24/2014 and 02/16/2014  FINDINGS: There is more cavitation now present within the infiltrate involving the right upper lobe. There may be some cavitation involving the infiltrate within the left lower lobe and possibly lingula as well. No definite effusion is seen. No adenopathy is noted. The heart is within normal limits in size. No bony abnormality is seen.  IMPRESSION: Worsening of cavitation within areas of opacification in the right upper lobe and left lower lobe -lingula.   Electronically Signed   By: Dwyane Dee M.D.   On: 03/05/2014 08:08

## 2014-03-05 NOTE — Telephone Encounter (Signed)
Pt's husband is requesting results & asks to be reached at [phone].  Kimberly Shelton

## 2014-03-05 NOTE — Telephone Encounter (Signed)
Pt husband- explained results as below Requests that CT be set up ASAP as they have an appt with Cardiology 03/06/14 and was hoping she could have scan done while there.  Explained to the husband that this may or may not be possible--depends on how the insurance side and pre certing takes, also if they even have an available appt 03/06/14  Per [place], requires pre cert and will take about 48hrs on the insurance end to process.  Appt will more than likely be Monday 03/11/14 or sometime next week.  [facility] will contact once scheduled.  Nothing further needed.

## 2014-03-06 ENCOUNTER — Encounter: Payer: Self-pay | Admitting: Cardiovascular Disease

## 2014-03-06 ENCOUNTER — Ambulatory Visit (INDEPENDENT_AMBULATORY_CARE_PROVIDER_SITE_OTHER): Payer: Managed Care, Other (non HMO) | Admitting: Cardiovascular Disease

## 2014-03-06 VITALS — BP 100/64 | HR 101 | Ht 64.0 in | Wt 121.0 lb

## 2014-03-06 DIAGNOSIS — R0600 Dyspnea, unspecified: Secondary | ICD-10-CM

## 2014-03-06 DIAGNOSIS — I498 Other specified cardiac arrhythmias: Secondary | ICD-10-CM

## 2014-03-06 DIAGNOSIS — R000 Tachycardia, unspecified: Secondary | ICD-10-CM

## 2014-03-06 DIAGNOSIS — R0989 Other specified symptoms and signs involving the circulatory and respiratory systems: Secondary | ICD-10-CM

## 2014-03-06 DIAGNOSIS — R0609 Other forms of dyspnea: Secondary | ICD-10-CM

## 2014-03-06 DIAGNOSIS — F17201 Nicotine dependence, unspecified, in remission: Secondary | ICD-10-CM

## 2014-03-06 DIAGNOSIS — Z87891 Personal history of nicotine dependence: Secondary | ICD-10-CM

## 2014-03-06 NOTE — Patient Instructions (Addendum)
Your physician recommends that you schedule a follow-up appointment for 6 weeks.  Scheduled for April 16, 2014 at 10:45

## 2014-03-06 NOTE — Progress Notes (Signed)
History of Present Illness: 53 yo female with history of RA, recent pneumonia here today for evaluation of tachycardia. She was admitted to [facility] 02/16/2014 through 02/24/2014 with right-sided pneumococcal pneumonia with cavitation in the setting of smoking and rheumatoid arthritis on immune modulators. She is still wearing supplemental Oxygen. She was seen 03/04/14 by Dr. [name] and HR noted to be 111 bpm.   She tells me today that she she has been feeling better but still dyspneic. Only chest pain when coughing. No change in weight. No lower ext edema.   Primary Care Physician: Burchette  Past Medical History  Diagnosis Date  . Abnormal Pap smear 09/2006    HGSIL CIN 2/VAIN/ CIN 3/VAIN-3 CIS  . RA (rheumatoid arthritis) 07/2009  . Pneumonia     Past Surgical History  Procedure Laterality Date  . Cervical biopsy  w/ loop electrode excision  2008    CIN 3  . Pilonidal cyst excision  1999    I&D    Current Outpatient Prescriptions  Medication Sig Dispense Refill  . dextromethorphan-guaiFENesin (MUCINEX DM) 30-600 MG per 12 hr tablet Take 1 tablet by mouth 2 (two) times daily.  10 tablet  0  . escitalopram (LEXAPRO) 10 MG tablet Take 1 tablet (10 mg total) by mouth daily.  90 tablet  3  . hydrOXYzine (ATARAX/VISTARIL) 50 MG tablet Take 1 tablet (50 mg total) by mouth every 6 (six) hours as needed for anxiety.  30 tablet  0  . ipratropium-albuterol (DUONEB) 0.5-2.5 (3) MG/3ML SOLN Take 3 mLs by nebulization 3 (three) times daily.  360 mL  1  . naproxen sodium (ANAPROX) 220 MG tablet Take 220 mg by mouth 2 (two) times daily as needed.       No current facility-administered medications for this visit.    Allergies  Allergen Reactions  . Bee Venom   . Codeine     "seeing spots"  . Penicillins     REACTION: rash, itiching    History   Social History  . Marital Status: Married    Spouse Name: N/A    Number of Children: 1  . Years of Education: N/A    Occupational History  . Analyst II  [employer]   Social History Main Topics  . Smoking status: Former Smoker -- 1.00 packs/day for 30 years    Types: Cigarettes    Quit date: 02/16/2014  . Smokeless tobacco: Never Used  . Alcohol Use: Yes  . Drug Use: No  . Sexual Activity: Yes    Partners: Male    Birth Control/ Protection: Post-menopausal   Other Topics Concern  . Not on file   Social History Narrative  . No narrative on file    Family History  Problem Relation Age of Onset  . Diabetes Mother   . Rheum arthritis Mother   . Diabetes Father   . COPD Father   . Bronchitis Father   . Cancer Brother     Lung  . CAD Neg Hx     Review of Systems:  As stated in the HPI and otherwise negative.   BP 100/64  Pulse 101  Ht 5\' 4"  (1.626 m)  Wt 121 lb (54.885 kg)  BMI 20.76 kg/m2  LMP 09/06/2004  Physical Examination: General: Well developed, well nourished, NAD HEENT: OP clear, mucus membranes moist SKIN: warm, dry. No rashes. Neuro: No focal deficits Musculoskeletal: Muscle strength 5/5 all ext Psychiatric: Mood and affect normal  Neck: No JVD, no carotid bruits, no thyromegaly, no lymphadenopathy. Lungs:Clear bilaterally, no wheezes, rhonci, crackles Cardiovascular: Regular rate and rhythm. No murmurs, gallops or rubs. Abdomen:Soft. Bowel sounds present. Non-tender.  Extremities: No lower extremity edema. Pulses are 2 + in the bilateral DP/PT.  EKG: Sinus tach, rate 101 bpm. RAE. Poor R wave progression precordial leads  Echo 02/18/14: Procedure narrative: Transthoracic echocardiography. Image quality was adequate. The study was technically difficult. - Left ventricle: The cavity size was normal. Wall thickness was normal. Systolic function was vigorous. The estimated ejection fraction was in the range of 65% to 70%. Wall motion was normal; there were no regional wall motion abnormalities. Doppler parameters are consistent with abnormal left  ventricular relaxation (grade 1 diastolic dysfunction).  Assessment and Plan:   1. Tachycardia: She has sinus tachycardia. She has had a recent severe pulmonary infection and is still requiring supplemental oxygen. Echo 02/18/14 with normal LV function and no evidence of pericardial effusion. I suspect that her tachycardia is related to her ongoing lung issues. She has stopped smoking. At this time, no further cardiac workup is recommended. While she may very well have underlying CAD given history of long time tobacco abuse, she has no symptoms of unstable angina. I do not think a stress test is indicated. I will see her back in 6 weeks.   2. Tobacco abuse, in remission: She has stopped smoking.   3. Dyspnea: Likely related to underlying lung disease.

## 2014-03-09 NOTE — Assessment & Plan Note (Addendum)
#  Pneumococcal pneumonia  - glad you are better  - continue duoneb per schedule  - do CXR 2 view today; will call with results - showed cavitation-> get ct - continue to stay off immunomodulators of RA      #FOllowup Full PFT in 4 weeks Return to see my NP Tammy in 4 weeks

## 2014-03-09 NOTE — Assessment & Plan Note (Signed)
She only had grade 1 diastolic dysfn with her pna admission. Now she is very tachycardic with walking. Likely pulmonary and deconditioning related but will get cardiology to see her formally

## 2014-03-12 ENCOUNTER — Ambulatory Visit (INDEPENDENT_AMBULATORY_CARE_PROVIDER_SITE_OTHER)
Admission: RE | Admit: 2014-03-12 | Discharge: 2014-03-12 | Disposition: A | Discharge status: Home / Self Care | Payer: Managed Care, Other (non HMO) | Source: Ambulatory Visit | Attending: Internal Medicine | Admitting: Internal Medicine

## 2014-03-12 ENCOUNTER — Telehealth: Payer: Self-pay | Admitting: Internal Medicine

## 2014-03-12 DIAGNOSIS — J984 Other disorders of lung: Secondary | ICD-10-CM

## 2014-03-12 DIAGNOSIS — J189 Pneumonia, unspecified organism: Secondary | ICD-10-CM

## 2014-03-12 NOTE — Telephone Encounter (Signed)
Kimberly Shelton  Let her know on both lungs that there is cavity where there was pneumonia.. Nothing can be done actively other than to give time to heal the lungs which because she is feeling well it will over time. She should keep appt with NP in 4 weeks  Thanks  Dr. Kalman Shan, M.D., [facility].C.P Pulmonary and Critical Care Medicine Staff Physician [facility] Pulmonary and Critical Care Pager: [phone], If no answer or between  15:00h - 7:00h: call [phone]  03/12/2014 9:58 PM

## 2014-03-13 ENCOUNTER — Telehealth: Payer: Self-pay | Admitting: Internal Medicine

## 2014-03-13 DIAGNOSIS — J13 Pneumonia due to Streptococcus pneumoniae: Secondary | ICD-10-CM

## 2014-03-13 NOTE — Telephone Encounter (Signed)
lmomtcb x1 for [employer]

## 2014-03-13 NOTE — Telephone Encounter (Signed)
See phone note 03/13/14 

## 2014-03-13 NOTE — Telephone Encounter (Signed)
Elizabeth from PT with [facility] called back and she stated that since the pt does not see MR until the end of the month she went ahead and checked her oxygen levels today---she stated as follows:  Pt stopped using her oxygen about 4-5 days ago.  Resting on room air today the pt was at 98%. Walked for about 9 minutes outside on room air and the pt was at 95%  Lanora Manis wanted to see if MR had any recs for the pt--should she use the oxygen at night or cont to stay off of it?  Please advise.  Thanks  Allergies  Allergen Reactions  . Bee Venom   . Codeine     "seeing spots"  . Penicillins     REACTION: rash, itiching    Current Outpatient Prescriptions on File Prior to Visit  Medication Sig Dispense Refill  . dextromethorphan-guaiFENesin (MUCINEX DM) 30-600 MG per 12 hr tablet Take 1 tablet by mouth 2 (two) times daily.  10 tablet  0  . escitalopram (LEXAPRO) 10 MG tablet Take 1 tablet (10 mg total) by mouth daily.  90 tablet  3  . hydrOXYzine (ATARAX/VISTARIL) 50 MG tablet Take 1 tablet (50 mg total) by mouth every 6 (six) hours as needed for anxiety.  30 tablet  0  . ipratropium-albuterol (DUONEB) 0.5-2.5 (3) MG/3ML SOLN Take 3 mLs by nebulization 3 (three) times daily.  360 mL  1  . naproxen sodium (ANAPROX) 220 MG tablet Take 220 mg by mouth 2 (two) times daily as needed.       No current facility-administered medications on file prior to visit.

## 2014-03-13 NOTE — Telephone Encounter (Signed)
Kalman Shan, MD at 03/12/2014 9:57 PM     Status: Signed        Robynn Pane  Let her know on both lungs that there is cavity where there was pneumonia.. Nothing can be done actively other than to give time to heal the lungs which because she is feeling well it will over time. She should keep appt with NP in 4 weeks  --  Spouse aware of results. He voiced his understanding and nothing further needed

## 2014-03-14 NOTE — Telephone Encounter (Addendum)
She needs ono test on room air in order to dc o2 at night; not sure and cannot remember if i ordered it at prior OV? She needs one if not but keep in mind Ia m out of town 03/20/14 - 04/10/14  Ok for no o2 in day time at rest and walking aruond house    Dr. Kalman Shan, M.D., [facility].C.P Pulmonary and Critical Care Medicine Staff Physician [facility] Pulmonary and Critical Care Pager: [phone], If no answer or between  15:00h - 7:00h: call [phone]  03/14/2014 7:01 PM

## 2014-03-15 NOTE — Addendum Note (Signed)
Addended by: Tommie Sams on: 03/15/2014 10:29 AM   Modules accepted: Orders

## 2014-03-15 NOTE — Telephone Encounter (Signed)
Called Kimberly Shelton and made her aware of MR recs. I have ordered ONO. She will inform pt. Nothing further needed

## 2014-03-19 ENCOUNTER — Telehealth: Payer: Self-pay | Admitting: Emergency Medicine

## 2014-03-19 NOTE — Telephone Encounter (Signed)
Disability forms completed and sent to [employer] via dumbwaiter. Nothing further needed.

## 2014-03-22 ENCOUNTER — Telehealth: Payer: Self-pay | Admitting: Internal Medicine

## 2014-03-22 NOTE — Telephone Encounter (Signed)
Called and spoke to pt. Pt was inquiring why she was scheduled out of work till September. I informed pt that it was at [facility] discretion that she should be out of work till that day. I informed the pt that I could send message to MR to see if it could be changed to an earlier date but it wouldn't be able to get back to her till the middle of August d/t MR being out of the country. Pt denied to have date change. Pt verbalized understanding and denied any further questions or concerns at this time.

## 2014-03-22 NOTE — Telephone Encounter (Signed)
Pt wanting a letter to return back to [facility] like to return on 04/08/14 Pt states that this is the beginning of a new pay period and she really wants to start this day instead of later in the month. Pt coming in to see Tammy Parrett on 04/02/14 at 12pm Pt aware that MR out of office until 04/11/14 and we will send this message to TP to ask if she feels she might be able to release her back to work on this date seeing that Dr [name] not in office.   Please advise Tammy. Thanks.

## 2014-04-02 ENCOUNTER — Ambulatory Visit (INDEPENDENT_AMBULATORY_CARE_PROVIDER_SITE_OTHER): Payer: Managed Care, Other (non HMO) | Admitting: Internal Medicine

## 2014-04-02 ENCOUNTER — Ambulatory Visit (INDEPENDENT_AMBULATORY_CARE_PROVIDER_SITE_OTHER)
Admission: RE | Admit: 2014-04-02 | Discharge: 2014-04-02 | Disposition: A | Discharge status: Home / Self Care | Payer: Managed Care, Other (non HMO) | Source: Ambulatory Visit | Attending: Adult Health | Admitting: Adult Health

## 2014-04-02 ENCOUNTER — Ambulatory Visit (INDEPENDENT_AMBULATORY_CARE_PROVIDER_SITE_OTHER): Payer: Managed Care, Other (non HMO) | Admitting: Adult Health

## 2014-04-02 ENCOUNTER — Encounter: Payer: Self-pay | Admitting: Adult Health

## 2014-04-02 VITALS — BP 106/58 | HR 97 | Temp 98.2°F | Ht 64.0 in | Wt 131.0 lb

## 2014-04-02 DIAGNOSIS — S27309D Unspecified injury of lung, unspecified, subsequent encounter: Secondary | ICD-10-CM

## 2014-04-02 DIAGNOSIS — J13 Pneumonia due to Streptococcus pneumoniae: Secondary | ICD-10-CM

## 2014-04-02 DIAGNOSIS — S27309A Unspecified injury of lung, unspecified, initial encounter: Secondary | ICD-10-CM

## 2014-04-02 DIAGNOSIS — Z5189 Encounter for other specified aftercare: Secondary | ICD-10-CM

## 2014-04-02 LAB — PULMONARY FUNCTION TEST
DL/VA % pred: 101 %
DL/VA: 4.76 ml/min/mmHg/L
DLCO unc % pred: 57 %
DLCO unc: 13.15 ml/min/mmHg
FEF 25-75 POST: 2.03 L/s
FEF 25-75 Pre: 1.97 L/sec
FEF2575-%Change-Post: 3 %
FEF2575-%Pred-Post: 80 %
FEF2575-%Pred-Pre: 77 %
FEV1-%Change-Post: 3 %
FEV1-%PRED-POST: 64 %
FEV1-%Pred-Pre: 61 %
FEV1-POST: 1.68 L
FEV1-PRE: 1.62 L
FEV1FVC-%CHANGE-POST: 5 %
FEV1FVC-%PRED-PRE: 110 %
FEV6-%Change-Post: 0 %
FEV6-%Pred-Post: 56 %
FEV6-%Pred-Pre: 56 %
FEV6-Post: 1.82 L
FEV6-Pre: 1.83 L
FEV6FVC-%Change-Post: 0 %
FEV6FVC-%Pred-Post: 103 %
FEV6FVC-%Pred-Pre: 102 %
FVC-%CHANGE-POST: -1 %
FVC-%Pred-Post: 54 %
FVC-%Pred-Pre: 55 %
FVC-Post: 1.82 L
FVC-Pre: 1.85 L
PRE FEV1/FVC RATIO: 87 %
PRE FEV6/FVC RATIO: 99 %
Post FEV1/FVC ratio: 92 %
Post FEV6/FVC ratio: 100 %
RV % PRED: 102 %
RV: 1.86 L
TLC % pred: 71 %
TLC: 3.52 L

## 2014-04-02 NOTE — Patient Instructions (Signed)
Continue on current regimen.  Advance activity as tolerated.  Chest xray today .  Follow up Dr. [name] in 3-4 weeks with chest xray.  Great job on not smoking.  May return back to work on 04/15/14  Please contact office for sooner follow up if symptoms do not improve or worsen or seek emergency care

## 2014-04-02 NOTE — Progress Notes (Signed)
PFT done today. 

## 2014-04-03 ENCOUNTER — Telehealth: Payer: Self-pay | Admitting: Internal Medicine

## 2014-04-03 NOTE — Telephone Encounter (Signed)
Notes Recorded by Julio Sicks, NP on 04/03/2014 at 12:52 PM Clinically improving -xray lag  Will cont to follow very closely  follow up with cxr as planned in 3 weeks and .As needed  Please contact office for sooner follow up if symptoms do not improve or worsen or seek emergency care  ---  I spoke with patient about results and she verbalized understanding and had no questions.

## 2014-04-04 NOTE — Telephone Encounter (Signed)
Yes, this was taken care of at the 7.28.15 ov w/ TP.  Will sign off.

## 2014-04-04 NOTE — Telephone Encounter (Signed)
Please advise if this can be signed off JJ thanks

## 2014-04-05 ENCOUNTER — Telehealth: Payer: Self-pay | Admitting: Internal Medicine

## 2014-04-05 NOTE — Telephone Encounter (Signed)
ATC PT at # above. Received message subscriber NA and to try my call back again later.

## 2014-04-05 NOTE — Telephone Encounter (Signed)
Spoke with patient-she states she needs her OV notes from being seen in our office, Xrays, and work notes from MR wanting her to stay out of work longer faxed to [employer]. Pt is aware that she will need to speak with medical records and will have to sign a release stating she is okay with releasing the records to [facility]. I transferred her to medical records Marylu Lund) at [phone] and gave her the direct number for future needs. Nothing more needed from our office at this time.

## 2014-04-05 NOTE — Progress Notes (Signed)
Subjective:    Patient ID: Kimberly Shelton, female    DOB: 05-Aug-1961, 53 y.o.   MRN: [id number]  HPI   OV 03/04/2014 Chief Complaint  Patient presents with  . Follow-up    HFU-D/c from [facility]. Pt states she has chest soreness from coughing. Pt states with the O2 her breathing has improved. C/o dyspnea when working with PT and cough with yellow mucous.    Admitted 02/16/2014 through 02/24/2014 with right-sided pneumococcal pneumonia with cavitation in the setting of smoking and rheumatoid arthritis on immune modulators. At this point in time she is off the immunomodulators. Dr. Dierdre Forth her rheumatologist is aware of this. She is 25% back to her baseline but still very fatigued and short of breath. She coughs a lot with home physical therapy. She brings up green mucus and sputum with her cough needed overall cough is significantly improved since discharge. Appetite is slowly returning. 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 She is requesting to stay off work for several weeks. On 2 L there is documentation that when she walks 2 minutes up saturation still stays at 95%.   Today in the office pon room air at rest t it is 93%. Walked 40 feet and desaturated to 89% but HR 150/min per tech: I think this is all deconditioning but not sure if she has cardiomyopathy from her illness   04/02/14 Follow up  Pt returns for follow up from recent sever critical illness from streptococcal PNA , Bacteremia, sepsis 02/2014.   She had a right-sided pneumococcal pneumonia with cavitation in the setting of smoking and rheumatoid arthritis on immune modulators Today PFT show FEV1 61% , ratio 87  , FVC 55 %  , NO sign BD response, Diffusing capacity  Decreased 57% Has not smoked.  Wants to return to work soon.  CXR shows persistent RUL/LLL cavitary lesions .  Says she is feeling so much better.  No fever, chest pain, orthopnea, edema or n/v/d.    Good appetite . Seen by cards for tachycardia felt secondary to recent PNA.   Review of Systems  Constitutional: Negative for fever and unexpected weight change.  HENT: Negative for congestion, dental problem, ear pain, nosebleeds, postnasal drip, rhinorrhea, sinus pressure, sneezing, sore throat and trouble swallowing.   Eyes: Negative for redness and itching.  Respiratory: Positive for cough  Negative for chest tightness and wheezing.   Cardiovascular: Negative for palpitations and leg swelling.  Gastrointestinal: . Negative for vomiting.  Genitourinary: Negative for dysuria.  Musculoskeletal: Negative for joint swelling.  Skin: Negative for rash.  Neurological: Negative for headaches.  Hematological: Does not bruise/bleed easily.  Psychiatric/Behavioral: Negative for dysphoric mood. The patient is not nervous/anxious.        Objective:   Physical Exam  Vitals reviewed. Constitutional: She is oriented to person, place, and time. She appears well-developed and well-nourished. No distress.     HENT:  Head: Normocephalic and atraumatic.  Right Ear: External ear normal.  Left Ear: External ear normal.  Mouth/Throat: Oropharynx is clear and moist. No oropharyngeal exudate.  Eyes: Conjunctivae and EOM are normal. Pupils are equal, round, and reactive to light. Right eye exhibits no discharge. Left eye exhibits no discharge. No scleral icterus.  Neck: Normal range of motion. Neck supple. No JVD present. No tracheal deviation present. No thyromegaly present.  Cardiovascular: Normal rate, regular rhythm, normal heart sounds and intact  distal pulses.  Exam reveals no gallop and no friction rub.   No murmur heard. Pulmonary/Chest: Effort normal and breath sounds normal. No respiratory distress. She has no wheezes. She has no rales. She exhibits no tenderness.  barrell chest  Abdominal: Soft. Bowel sounds are normal. She exhibits no distension and no mass. There is no tenderness. There is  no rebound and no guarding.  Musculoskeletal: Normal range of motion. She exhibits no edema and no tenderness.  Lymphadenopathy:    She has no cervical adenopathy.  Neurological: She is alert and oriented to person, place, and time. She has normal reflexes. No cranial nerve deficit. She exhibits normal muscle tone. Coordination normal.  Skin: Skin is warm and dry. No rash noted. She is not diaphoretic. No erythema. No pallor.  Psychiatric: She has a normal mood and affect. Her behavior is normal. Judgment and thought content normal.         Assessment & Plan:

## 2014-04-05 NOTE — Assessment & Plan Note (Addendum)
Clinical improvement from severe critical illness  cxr w/ persistent densities -will need serial chest xray for clearance PFT shows restrictive lung disease w/ no sign airflow obstruction in setting of persistent lung consolidation , consider repeating in 3 months once PNA has cleared to see if improved, may need repeat CT in future to evaluate if not clearing on CXR .  Clinically she is improving despite lag time on cxr , hold on additional abx as no fever, good appetite and good O2 sats on RA .   Plan  Continue on current regimen.  Advance activity as tolerated.  Chest xray today .  Follow up Dr. [name] in 3-4 weeks with chest xray.  Great job on not smoking.  May return back to work on 04/15/14  Please contact office for sooner follow up if symptoms do not improve or worsen or seek emergency care

## 2014-04-05 NOTE — Telephone Encounter (Signed)
Pt returned call. Pt needs medical records. I informed pt to talk to our medical records department. Pt also needs to have this taken care of today due to Monday being AUg 3rd.  Please call back at [phone]

## 2014-04-09 ENCOUNTER — Telehealth: Payer: Self-pay | Admitting: Internal Medicine

## 2014-04-09 NOTE — Telephone Encounter (Signed)
ono for DESI CARBY on 03/21/14 shows no desats below 88%. No need for night o2; not sure if she is still on night o2

## 2014-04-10 NOTE — Telephone Encounter (Signed)
Called and spoke to pt. Informed pt of results and recs per MR. Pt verbalized understanding and denied any further questions or concerns at this time. Pt states she is no longer on nocturnal O2, her DME company already picked it up. Nothing further needed.

## 2014-04-16 ENCOUNTER — Encounter: Payer: Self-pay | Admitting: Cardiovascular Disease

## 2014-04-16 ENCOUNTER — Ambulatory Visit (INDEPENDENT_AMBULATORY_CARE_PROVIDER_SITE_OTHER): Payer: Managed Care, Other (non HMO) | Admitting: Cardiovascular Disease

## 2014-04-16 VITALS — BP 137/80 | HR 99 | Ht 64.0 in | Wt 133.0 lb

## 2014-04-16 DIAGNOSIS — F17201 Nicotine dependence, unspecified, in remission: Secondary | ICD-10-CM

## 2014-04-16 DIAGNOSIS — Z87891 Personal history of nicotine dependence: Secondary | ICD-10-CM

## 2014-04-16 DIAGNOSIS — R000 Tachycardia, unspecified: Secondary | ICD-10-CM

## 2014-04-16 DIAGNOSIS — I498 Other specified cardiac arrhythmias: Secondary | ICD-10-CM

## 2014-04-16 NOTE — Progress Notes (Signed)
History of Present Illness: 53 yo female with history of RA, recent pneumonia here today for cardiac follow up. I saw her 03/06/14 for evaluation of tachycardia. She was admitted to [facility] 02/16/2014 through 02/24/2014 with right-sided pneumococcal pneumonia with cavitation in the setting of smoking and rheumatoid arthritis on immune modulators. She is being followed by pulmonary. She was seen 03/04/14 by Dr. [name] and HR noted to be 111 bpm. Echo 02/18/14 with normal LV function, no evidence of pericardial effusion. It was felt that her tachycardia was in response to her acute illness and lung disease.   She is here today for follow up. She is feeling much better. She has stopped smoking. She is not wearing supplemental oxygen. No chest pain.   Primary Care Physician: Burchette  Past Medical History  Diagnosis Date  . Abnormal Pap smear 09/2006    HGSIL CIN 2/VAIN/ CIN 3/VAIN-3 CIS  . RA (rheumatoid arthritis) 07/2009  . Pneumonia     Past Surgical History  Procedure Laterality Date  . Cervical biopsy  w/ loop electrode excision  2008    CIN 3  . Pilonidal cyst excision  1999    I&D    Current Outpatient Prescriptions  Medication Sig Dispense Refill  . dextromethorphan-guaiFENesin (MUCINEX DM) 30-600 MG per 12 hr tablet Take 1 tablet by mouth 2 (two) times daily.  10 tablet  0  . escitalopram (LEXAPRO) 10 MG tablet Take 1 tablet (10 mg total) by mouth daily.  90 tablet  3  . ipratropium-albuterol (DUONEB) 0.5-2.5 (3) MG/3ML SOLN Take 3 mLs by nebulization 3 (three) times daily.  360 mL  1   No current facility-administered medications for this visit.    Allergies  Allergen Reactions  . Bee Venom   . Codeine     "seeing spots"  . Penicillins     REACTION: rash, itiching    History   Social History  . Marital Status: Married    Spouse Name: N/A    Number of Children: 1  . Years of Education: N/A   Occupational History  . Analyst II  [employer]   Social History Main Topics  . Smoking status: Former Smoker -- 1.00 packs/day for 30 years    Types: Cigarettes    Quit date: 02/16/2014  . Smokeless tobacco: Never Used  . Alcohol Use: Yes  . Drug Use: No  . Sexual Activity: Yes    Partners: Male    Birth Control/ Protection: Post-menopausal   Other Topics Concern  . Not on file   Social History Narrative  . No narrative on file    Family History  Problem Relation Age of Onset  . Diabetes Mother   . Rheum arthritis Mother   . Diabetes Father   . COPD Father   . Bronchitis Father   . Cancer Brother     Lung  . CAD Neg Hx     Review of Systems:  As stated in the HPI and otherwise negative.   BP 137/80  Pulse 99  Ht 5\' 4"  (1.626 m)  Wt 133 lb (60.328 kg)  BMI 22.82 kg/m2  LMP 09/06/2004  Physical Examination: General: Well developed, well nourished, NAD HEENT: OP clear, mucus membranes moist SKIN: warm, dry. No rashes. Neuro: No focal deficits Musculoskeletal: Muscle strength 5/5 all ext Psychiatric: Mood and affect normal Neck: No JVD, no carotid bruits, no thyromegaly, no lymphadenopathy. Lungs:Clear bilaterally, no wheezes, rhonci, crackles Cardiovascular: Regular  rate and rhythm. No murmurs, gallops or rubs. Abdomen:Soft. Bowel sounds present. Non-tender.  Extremities: No lower extremity edema. Pulses are 2 + in the bilateral DP/PT.  Echo 02/18/14: Procedure narrative: Transthoracic echocardiography. Image quality was adequate. The study was technically difficult. - Left ventricle: The cavity size was normal. Wall thickness was normal. Systolic function was vigorous. The estimated ejection fraction was in the range of 65% to 70%. Wall motion was normal; there were no regional wall motion abnormalities. Doppler parameters are consistent with abnormal left ventricular relaxation (grade 1 diastolic dysfunction).  Assessment and Plan:   1. Tachycardia: She has sinus tachycardia and this is  improving. Echo 02/18/14 with normal LV function and no evidence of pericardial effusion. Her tachycardia is thought to be related to her ongoing lung issues. She has stopped smoking. At this time, no further cardiac workup is recommended. While she may very well have underlying CAD given history of long time tobacco abuse, she has no symptoms of unstable angina. I do not think a stress test is indicated.   2. Tobacco abuse, in remission: She has stopped smoking.

## 2014-04-16 NOTE — Patient Instructions (Signed)
Your physician wants you to follow-up in:  12 months.  You will receive a reminder letter in the mail two months in advance. If you don't receive a letter, please call our office to schedule the follow-up appointment.   

## 2014-04-17 ENCOUNTER — Telehealth: Payer: Self-pay | Admitting: Internal Medicine

## 2014-04-17 NOTE — Telephone Encounter (Signed)
Pt states that she was written out of work until Sept 1-- letter in [facility] written in June was not detailed enough. Pt states that [employer] denied the claim stating that our office did not send enough information to qualify for approval. Disability denied. Needing a more detailed letter as to why the patient needs to be out of work until Sept 1. Pt gives permission for [place] to write a detailed letter to her insurance company with any information needed to get Disability approved.   Letter needs addressed  ATTN: S. Janee Morn  [phone]   Please advise Dr [name]. Thanks.

## 2014-04-17 NOTE — Telephone Encounter (Signed)
Letter generated using MR's documentation below All pulmonary ov's and the most recent hospital discharge along with all radiology reports from the ov's have been faxed to the number provided by pt and verified that the fax has gone thru (30 pages total)  Called spoke with patient and discussed the above with her.   Pt very grateful for our time and assistance. Pt is to receive a call from her contact person Florestine Avers by 5.30pm today to verify receipt Nothing further needed from our end at this time; will sign off.

## 2014-04-17 NOTE — Telephone Encounter (Signed)
Review of cxr 03/2814 still shows persistent infiltrates and fibrotic lung. She is still physically deconditioned. So even though bettter she still has abnormalities and significant deconditioning. So, she should be off work till 05/07/14  Tha nks  Dr. Kalman Shan, M.D., F.C.C.P Pulmonary and Critical Care Medicine Staff Physician [facility] Pulmonary and Critical Care Pager: [phone], If no answer or between  15:00h - 7:00h: call [phone]  04/17/2014 3:47 PM

## 2014-04-17 NOTE — Telephone Encounter (Signed)
Called spoke with patient who reported that MR did do a form last month and this was received by Monia Pouch Now they are requiring additional info.  Asked pt if she had a contact name/number so that we may ask for specifics regarding that info they need >> [phone], Kimberly Shelton.  Claim # [id number].  [employer] and spoke with representative Niya who reported that they are looking for ov notes, progress notes, test results, anything that indicates why pt cannot return to work until specified date.  Verified the fax number provided by pt.  NOTE: the work note done on 6.29.15 excused pt for additional 4 weeks from that date.  The 7.28.15 ov w/ TP says pt can return to work on 8.10.15.  The 7.31.15 phone note shows that pt stated MR said she could be out of work until 9.1.15.  Dr [name] please advise on the letter and if pt can in fact return to work on 9.1.15.  Thank you.  *MR paged again d/t to the time-sensitivity of the letter

## 2014-04-17 NOTE — Telephone Encounter (Signed)
Pt is asking to speak w/ nurse again.  Pt states this needs to be done today.  Please call back.  Kimberly Shelton

## 2014-04-17 NOTE — Telephone Encounter (Signed)
I thought before I left for vacation n7/13/15 I filled out a form. As of yesterday no new form on my desk. IF there is another form, please check with Robynn Pane.   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 Just an epic letter?   Thanks  Dr. Kalman Shan, M.D., [facility].C.P Pulmonary and Critical Care Medicine Staff Physician [facility] Pulmonary and Critical Care Pager: [phone], If no answer or between  15:00h - 7:00h: call [phone]  04/17/2014 12:31 PM

## 2014-04-17 NOTE — Telephone Encounter (Signed)
Spoke with patient-- patient was calling to reiterate the importance of having this letter written and faxed TODAY to [facility].  Advised the patient that the message has been sent to MR and he has been paged. Once he responds we can move forward.  Please advise Dr [name]. Thanks.

## 2014-04-17 NOTE — Telephone Encounter (Signed)
Pt calling a/b when to return to work.Kimberly Shelton

## 2014-04-30 ENCOUNTER — Encounter: Payer: Self-pay | Admitting: Internal Medicine

## 2014-04-30 ENCOUNTER — Ambulatory Visit (INDEPENDENT_AMBULATORY_CARE_PROVIDER_SITE_OTHER)
Admission: RE | Admit: 2014-04-30 | Discharge: 2014-04-30 | Disposition: A | Discharge status: Home / Self Care | Payer: Managed Care, Other (non HMO) | Source: Ambulatory Visit | Attending: Internal Medicine | Admitting: Internal Medicine

## 2014-04-30 ENCOUNTER — Encounter: Payer: Self-pay | Admitting: Emergency Medicine

## 2014-04-30 ENCOUNTER — Ambulatory Visit (INDEPENDENT_AMBULATORY_CARE_PROVIDER_SITE_OTHER): Payer: Managed Care, Other (non HMO) | Admitting: Internal Medicine

## 2014-04-30 VITALS — BP 130/68 | HR 75 | Ht 64.0 in | Wt 137.0 lb

## 2014-04-30 DIAGNOSIS — Z5189 Encounter for other specified aftercare: Secondary | ICD-10-CM

## 2014-04-30 DIAGNOSIS — J984 Other disorders of lung: Secondary | ICD-10-CM

## 2014-04-30 DIAGNOSIS — J189 Pneumonia, unspecified organism: Secondary | ICD-10-CM

## 2014-04-30 DIAGNOSIS — S27309D Unspecified injury of lung, unspecified, subsequent encounter: Secondary | ICD-10-CM

## 2014-04-30 DIAGNOSIS — J13 Pneumonia due to Streptococcus pneumoniae: Secondary | ICD-10-CM

## 2014-04-30 DIAGNOSIS — S27309A Unspecified injury of lung, unspecified, initial encounter: Secondary | ICD-10-CM

## 2014-04-30 NOTE — Progress Notes (Signed)
Subjective:    Patient ID: Kimberly Shelton, female    DOB: [birth year], 53 y.o.   MRN: [id number]  HPI  HPI   OV 03/04/2014 Chief Complaint  Patient presents with  . Follow-up    HFU-D/c from [facility]. Pt states she has chest soreness from coughing. Pt states with the O2 her breathing has improved. C/o dyspnea when working with PT and cough with yellow mucous.    Admitted 02/16/2014 through 02/24/2014 with right-sided pneumococcal pneumonia with cavitation in the setting of smoking and rheumatoid arthritis on immune modulators. At this point in time she is off the immunomodulators. Dr. Dierdre Forth her rheumatologist is aware of this. She is 25% back to her baseline but still very fatigued and short of breath. She coughs a lot with home physical therapy. She brings up green mucus and sputum with her cough needed overall cough is significantly improved since discharge. Appetite is slowly returning. 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 She is requesting to stay off work for several weeks. On 2 L there is documentation that when she walks 2 minutes up saturation still stays at 95%.   Today in the office pon room air at rest t it is 93%. Walked 40 feet and desaturated to 89% but HR 150/min per tech: I think this is all deconditioning but not sure if she has cardiomyopathy from her illness   04/02/14 Follow up  Pt returns for follow up from recent sever critical illness from streptococcal PNA , Bacteremia, sepsis 02/2014.   She had a right-sided pneumococcal pneumonia with cavitation in the setting of smoking and rheumatoid arthritis on immune modulators Today PFT show FEV1 61% , ratio 87  , FVC 55 %  , NO sign BD response, Diffusing capacity  Decreased 57% Has not smoked.  Wants to return to work soon.  CXR shows persistent RUL/LLL cavitary lesions .  Says she is feeling so much better.  No fever, chest pain, orthopnea, edema or  n/v/d.  Good appetite . Seen by cards for tachycardia felt secondary to recent PN   OV 05/01/2014  Chief Complaint  Patient presents with  . Follow-up    Pt states her breathing has improved since last OV. Pt c/o prod cough with yellow and clear mucous and lower mid lumbar pain, pt thinks d/t RA. Pt doing duoneb only once a day.  Pt denies SOB.     Admitted 02/16/2014 through 02/24/2014 with right-sided pneumococcal pneumonia with cavitation in the setting of smoking and rheumatoid arthritis on immune modulators. Current followup is for the same   S: Presents with husband. Feels great overall. No fatigute. No dyspnea. Able to do all ADLs. Wants to return to work now and is asking for release to work. In terms of RA, she feels early morning stiffness of hand joint is returning. SHe is seeing Dr Dierdre Forth 05/01/14 and will discuss with him about return to immunomodulators. There are no other issues. CXR done after she left and at time of this note:    Dg Chest 2 View  04/30/2014   CLINICAL DATA:  Followup cavitary pneumonia  EXAM: CHEST  2 VIEW  COMPARISON:  04/02/2014  FINDINGS: Normal heart size, mediastinal contours, and pulmonary vascularity.  Patchy infiltrates are identified in RIGHT upper lobe and LEFT lower lobe consistent with pneumonia.  Cavitary foci identified previously in the in both the RIGHT upper  and LEFT lower lobes are still seen, the less evident in the RIGHT upper lobe.  Scattered interstitial prominence again identified.  No gross pleural effusion or pneumothorax.  Bones unremarkable.  IMPRESSION: Persistent infiltrates are identified in the RIGHT upper lobe and LEFT lower lobe with persistent visualization of cavitary foci.   Electronically Signed   By: Ulyses Southward M.D.   On: 04/30/2014 19:09     Review of Systems  Constitutional: Negative for fever and unexpected weight change.  HENT: Negative for congestion, dental problem, ear pain, nosebleeds, postnasal drip,  rhinorrhea, sinus pressure, sneezing, sore throat and trouble swallowing.   Eyes: Negative for redness and itching.  Respiratory: Positive for cough. Negative for chest tightness, shortness of breath and wheezing.   Cardiovascular: Negative for palpitations and leg swelling.  Gastrointestinal: Negative for nausea and vomiting.  Genitourinary: Negative for dysuria.  Musculoskeletal: Positive for back pain. Negative for joint swelling.  Skin: Negative for rash.  Neurological: Negative for headaches.  Hematological: Does not bruise/bleed easily.  Psychiatric/Behavioral: Negative for dysphoric mood. The patient is not nervous/anxious.        Objective:   Physical Exam  Vitals reviewed. Constitutional: She is oriented to person, place, and time. She appears well-developed and well-nourished. No distress.  HENT:  Head: Normocephalic and atraumatic.  Right Ear: External ear normal.  Left Ear: External ear normal.  Mouth/Throat: Oropharynx is clear and moist. No oropharyngeal exudate.  Eyes: Conjunctivae and EOM are normal. Pupils are equal, round, and reactive to light. Right eye exhibits no discharge. Left eye exhibits no discharge. No scleral icterus.  Neck: Normal range of motion. Neck supple. No JVD present. No tracheal deviation present. No thyromegaly present.  Cardiovascular: Normal rate, regular rhythm, normal heart sounds and intact distal pulses.  Exam reveals no gallop and no friction rub.   No murmur heard. Pulmonary/Chest: Effort normal and breath sounds normal. No respiratory distress. She has no wheezes. She has no rales. She exhibits no tenderness.  Abdominal: Soft. Bowel sounds are normal. She exhibits no distension and no mass. There is no tenderness. There is no rebound and no guarding.  Musculoskeletal: Normal range of motion. She exhibits no edema and no tenderness.  Lymphadenopathy:    She has no cervical adenopathy.  Neurological: She is alert and oriented to person,  place, and time. She has normal reflexes. No cranial nerve deficit. She exhibits normal muscle tone. Coordination normal.  Skin: Skin is warm and dry. No rash noted. She is not diaphoretic. No erythema. No pallor.  Psychiatric: She has a normal mood and affect. Her behavior is normal. Judgment and thought content normal.     Filed Vitals:   04/30/14 1542  BP: 130/68  Pulse: 75  Height: 5\' 4"  (1.626 m)  Weight: 137 lb (62.143 kg)  SpO2: 98%        Assessment & Plan:  #Pneumococcal pneumonia  - glad you are better. clnically resolved.  - can go back to work  - do CXR 2 view today; will call with results - have flu shot in fall   #FOllowup Return to see me in 4 months or sooner if needed

## 2014-04-30 NOTE — Patient Instructions (Signed)
#  Pneumococcal pneumonia  - glad you are better. clnically resolved.  - can go back to work  - do CXR 2 view today; will call with results - have flu shot in fall   #FOllowup Return to see me in 4 months or sooner if needed

## 2014-05-01 NOTE — Assessment & Plan Note (Signed)
Clnically better. CXR due to cavity and extensive pneumonia will be fibrotic and slow to heal and she is likely to remain with permanent fibrotic process. However, she is well enough to not need o2.  She can go back to work.  She will be d/w Dr Dierdre Forth about reintroduction of immunomodulator for her RA; this will be a tough decision I think. She has recovered from her pneumonia but future agent is to to be chosen with care. She needs an agent to prevent deformity from RA

## 2014-05-02 ENCOUNTER — Telehealth: Payer: Self-pay | Admitting: Internal Medicine

## 2014-05-02 DIAGNOSIS — J13 Pneumonia due to Streptococcus pneumoniae: Secondary | ICD-10-CM

## 2014-05-02 NOTE — Telephone Encounter (Signed)
Spoke with pt and advised of cxr results per MR.  Pt scheduled for 3 mth f/u with MR.  Order placed for chest ct.

## 2014-05-02 NOTE — Telephone Encounter (Signed)
Please advise on cxr results in EMR from 04/30/14.

## 2014-05-02 NOTE — Telephone Encounter (Signed)
cxr 04/30/14 still shows scarring. We wil follow clinically.  Please change fu to 3 months with ct chest wo contrast

## 2014-05-07 ENCOUNTER — Ambulatory Visit: Payer: Managed Care, Other (non HMO) | Admitting: Nurse Practitioner

## 2014-05-21 ENCOUNTER — Other Ambulatory Visit: Payer: Self-pay | Admitting: Obstetrics & Gynecology

## 2014-05-21 ENCOUNTER — Other Ambulatory Visit: Payer: Self-pay

## 2014-05-21 NOTE — Telephone Encounter (Signed)
Last AEX:05/04/13  Last refill:05/09/13  Current AEX:06/21/14 with [employer] requires a 90 day supply per [facility].  Is it ok for 90 supply

## 2014-05-21 NOTE — Telephone Encounter (Signed)
Last AEX:05/04/13 Last refill:05/09/13 Current AEX:06/21/14 with Berneice Gandy  Please advise

## 2014-05-23 MED ORDER — ESCITALOPRAM OXALATE 10 MG PO TABS: 10.0000 mg | ORAL_TABLET | Freq: Every day | ORAL | Status: DC

## 2014-06-21 ENCOUNTER — Ambulatory Visit (INDEPENDENT_AMBULATORY_CARE_PROVIDER_SITE_OTHER): Payer: Managed Care, Other (non HMO) | Admitting: Nurse Practitioner

## 2014-06-21 ENCOUNTER — Encounter: Payer: Self-pay | Admitting: Nurse Practitioner

## 2014-06-21 ENCOUNTER — Ambulatory Visit (HOSPITAL_COMMUNITY)
Admission: RE | Admit: 2014-06-21 | Discharge: 2014-06-21 | Disposition: A | Discharge status: Home / Self Care | Payer: Managed Care, Other (non HMO) | Source: Ambulatory Visit | Attending: Nurse Practitioner | Admitting: Nurse Practitioner

## 2014-06-21 VITALS — BP 144/76 | HR 92 | Resp 20 | Ht 63.25 in | Wt 135.0 lb

## 2014-06-21 DIAGNOSIS — Z0000 Encounter for general adult medical examination without abnormal findings: Secondary | ICD-10-CM

## 2014-06-21 DIAGNOSIS — Z1231 Encounter for screening mammogram for malignant neoplasm of breast: Secondary | ICD-10-CM | POA: Diagnosis not present

## 2014-06-21 DIAGNOSIS — Z780 Asymptomatic menopausal state: Secondary | ICD-10-CM | POA: Insufficient documentation

## 2014-06-21 DIAGNOSIS — Z1211 Encounter for screening for malignant neoplasm of colon: Secondary | ICD-10-CM

## 2014-06-21 DIAGNOSIS — Z13820 Encounter for screening for osteoporosis: Secondary | ICD-10-CM | POA: Insufficient documentation

## 2014-06-21 DIAGNOSIS — E2839 Other primary ovarian failure: Secondary | ICD-10-CM

## 2014-06-21 DIAGNOSIS — Z9289 Personal history of other medical treatment: Secondary | ICD-10-CM

## 2014-06-21 DIAGNOSIS — Z01419 Encounter for gynecological examination (general) (routine) without abnormal findings: Secondary | ICD-10-CM

## 2014-06-21 NOTE — Patient Instructions (Addendum)

## 2014-06-21 NOTE — Progress Notes (Signed)
Patient ID: Kimberly Shelton, female   DOB: 02/24/1961, 53 y.o.   MRN: [id number] 53 y.o. G1P1 Married Caucasian Fe here for annual exam.  Father died in 2023/02/16.  She then got sick in June with bilateral pneumonia.  Hospital for 8 days. Then got CHF and sepsis from pneumonia.  She stopped smoking at that time.  No other health concerns.  She and her husband have been married for 2.5 years.  He is now having ED problems and she is encouraging him to go for treatment.  Patient's last menstrual period was 09/06/2004.          Sexually active: Yes.    The current method of family planning is post menopausal status.    Exercising: Yes.    Home exercise routine includes walking 5-7 days per week. Smoker:  Quit smoking 02/2014  Health Maintenance: Pap:  05/04/13, WNL, neg HR HPV MMG:  9//18/14, Bi-Rads 1:  Negative  Colonoscopy:  Never  TDaP:  2011 Pneumovax:  02/18/14 Labs:  02/2014 in hospital (pneumonia) and PCP, in EPIC   reports that she quit smoking about 4 months ago. Her smoking use included Cigarettes. She has a 30 pack-year smoking history. She has never used smokeless tobacco. She reports that she drinks alcohol. She reports that she does not use illicit drugs.  Past Medical History  Diagnosis Date  . Abnormal Pap smear 09/2006    HGSIL CIN 2/VAIN/ CIN 3/VAIN-3 CIS  . RA (rheumatoid arthritis) 07/2009  . Pneumonia     Past Surgical History  Procedure Laterality Date  . Cervical biopsy  w/ loop electrode excision  2008    CIN 3  . Pilonidal cyst excision  1999    I&D    Current Outpatient Prescriptions  Medication Sig Dispense Refill  . leflunomide (ARAVA) 20 MG tablet Take 1 tablet by mouth daily.      Marland Kitchen escitalopram (LEXAPRO) 10 MG tablet Take 1 tablet (10 mg total) by mouth daily.  90 tablet  0   No current facility-administered medications for this visit.    Family History  Problem Relation Age of Onset  . Diabetes Mother   . Rheum arthritis Mother   . Diabetes Father   .  COPD Father   . Bronchitis Father   . Cancer Brother     Lung  . CAD Neg Hx     ROS:  Pertinent items are noted in HPI.  Otherwise, a comprehensive ROS was negative.  Exam:   BP 144/76  Pulse 92  Resp 20  Ht 5' 3.25" (1.607 m)  Wt 135 lb (61.236 kg)  BMI 23.71 kg/m2  LMP 09/06/2004 Height: 5' 3.25" (160.7 cm)  Ht Readings from Last 3 Encounters:  06/21/14 5' 3.25" (1.607 m)  04/30/14 5\' 4"  (1.626 m)  04/16/14 5\' 4"  (1.626 m)    General appearance: alert, cooperative and appears stated age Head: Normocephalic, without obvious abnormality, atraumatic Neck: no adenopathy, supple, symmetrical, trachea midline and thyroid normal to inspection and palpation Lungs: clear to auscultation bilaterally Breasts: normal appearance, no masses or tenderness Heart: regular rate and rhythm Abdomen: soft, non-tender; no masses,  no organomegaly Extremities: extremities normal, atraumatic, no cyanosis or edema Skin: Skin color, texture, turgor normal. No rashes or lesions Lymph nodes: Cervical, supraclavicular, and axillary nodes normal. No abnormal inguinal nodes palpated Neurologic: Grossly normal   Pelvic: External genitalia:  no lesions              Urethra:  normal appearing urethra with no masses, tenderness or lesions              Bartholin's and Skene's: normal                 Vagina: normal appearing vagina with normal color and discharge, no lesions              Cervix: anteverted              Pap taken: Yes.   Bimanual Exam:  Uterus:  normal size, contour, position, consistency, mobility, non-tender              Adnexa: no mass, fullness, tenderness               Rectovaginal: Confirms               Anus:  normal sphincter tone, no lesions  A:  Well Woman with normal exam  Postmenopausal - no HRT  CIN III with LEEP 2008   Rheumatoid Arthritis  Recent history of pneumonia  P:   Reviewed health and wellness pertinent to exam  Pap smear taken today (pap yearly until  2018)  Mammogram is due now and will schedule - tried to schedule and they would not allow until seen since Dr. Precious Bard had retired.  Will also get a BMD  Will make appointment for GI consult for screening colonoscopy  Counseled on breast self exam, mammography screening, adequate intake of calcium and vitamin D, diet and exercise, Kegel's exercises return annually or prn  An After Visit Summary was printed and given to the patient.

## 2014-06-23 NOTE — Progress Notes (Signed)
Encounter reviewed by Dr. Tajah Noguchi Silva.  

## 2014-06-26 LAB — IPS PAP TEST WITH HPV

## 2014-07-02 ENCOUNTER — Telehealth: Payer: Self-pay | Admitting: *Deleted

## 2014-07-02 NOTE — Telephone Encounter (Signed)
I have attempted to contact this patient by phone with the following results: left message to return call to Dennis at [phone] on answering machine (mobile per [facility]).  No personal information given.  [phone] (Mobile)

## 2014-07-02 NOTE — Telephone Encounter (Signed)
Message copied by Luisa Dago on Tue Jul 02, 2014  1:20 PM ------      Message from: Roanna Banning      Created: Sun Jun 30, 2014 12:47 PM       Please let patient know that BMD done 06/21/14 shows a T score at the spine of -1.8 and at the hip -1.4.  This is Osteopenia at he spine and borderline at the hip.  No previous BMD for comparison.  The FRAX score for major osteoporotic fracture in 10 years is 17.2 % (goal is <20%) and for hip fracture 1.0 % (goal is <3%).  While there is some normal postmenopausal bone loss to be expected.  We do not want there to be an increase in loss.  She must do exercise with upper body weights, walking, calcium, and Vit D.  She has a greater risk with [facility] of osteoporosis and her history of RA.  Repeat in 2 years. ------

## 2014-07-08 ENCOUNTER — Encounter: Payer: Self-pay | Admitting: Nurse Practitioner

## 2014-07-11 NOTE — Telephone Encounter (Signed)
Pt notified in result note.  Closing encounter. 

## 2014-08-05 ENCOUNTER — Ambulatory Visit: Admission: RE | Admit: 2014-08-05 | Payer: Managed Care, Other (non HMO) | Source: Ambulatory Visit

## 2014-08-05 ENCOUNTER — Telehealth: Payer: Self-pay | Admitting: Internal Medicine

## 2014-08-05 ENCOUNTER — Ambulatory Visit (INDEPENDENT_AMBULATORY_CARE_PROVIDER_SITE_OTHER): Payer: Managed Care, Other (non HMO) | Admitting: Internal Medicine

## 2014-08-05 NOTE — Telephone Encounter (Signed)
MR since next available is not until January with you, is pt able to see TP after CT scan? thanks

## 2014-08-07 NOTE — Telephone Encounter (Signed)
Yes she Kimberly Shelton can see TP After CT scan  Dr. Kalman Shan, M.D., [facility].C.P Pulmonary and Critical Care Medicine Staff Physician [facility]  Pulmonary and Critical Care Pager: [phone], If no answer or between  15:00h - 7:00h: call [phone]  08/07/2014 9:47 AM

## 2014-08-07 NOTE — Telephone Encounter (Signed)
Called spoke with pt. MR had opening on Friday 12/4. appt scheduled

## 2014-08-08 ENCOUNTER — Ambulatory Visit (INDEPENDENT_AMBULATORY_CARE_PROVIDER_SITE_OTHER)
Admission: RE | Admit: 2014-08-08 | Discharge: 2014-08-08 | Disposition: A | Discharge status: Home / Self Care | Payer: Managed Care, Other (non HMO) | Source: Ambulatory Visit | Attending: Internal Medicine | Admitting: Internal Medicine

## 2014-08-08 DIAGNOSIS — J13 Pneumonia due to Streptococcus pneumoniae: Secondary | ICD-10-CM

## 2014-08-09 ENCOUNTER — Ambulatory Visit (INDEPENDENT_AMBULATORY_CARE_PROVIDER_SITE_OTHER): Payer: Managed Care, Other (non HMO) | Admitting: Internal Medicine

## 2014-08-09 ENCOUNTER — Encounter: Payer: Self-pay | Admitting: Internal Medicine

## 2014-08-09 VITALS — BP 110/68 | HR 80 | Wt 141.0 lb

## 2014-08-09 DIAGNOSIS — J189 Pneumonia, unspecified organism: Secondary | ICD-10-CM

## 2014-08-09 DIAGNOSIS — M069 Rheumatoid arthritis, unspecified: Secondary | ICD-10-CM | POA: Insufficient documentation

## 2014-08-09 DIAGNOSIS — J13 Pneumonia due to Streptococcus pneumoniae: Secondary | ICD-10-CM

## 2014-08-09 DIAGNOSIS — J984 Other disorders of lung: Secondary | ICD-10-CM

## 2014-08-09 NOTE — Progress Notes (Signed)
Subjective:    Patient ID: Kimberly Shelton, female    DOB: May 01, 1961, 53 y.o.   MRN: [id number]  HPI    OV 03/04/2014 Chief Complaint  Patient presents with  . Follow-up    [facility]D/[facility] from [facility]. Pt states she has chest soreness from coughing. Pt states with the O2 her breathing has improved. C/o dyspnea when working with PT and cough with yellow mucous.    Admitted 02/16/2014 through 02/24/2014 with right-sided pneumococcal pneumonia with cavitation in the setting of smoking and rheumatoid arthritis on immune modulators. At this point in time she is off the immunomodulators. Dr. Dierdre Forth her rheumatologist is aware of this. She is 25% back to her baseline but still very fatigued and short of breath. She coughs a lot with home physical therapy. She brings up green mucus and sputum with her cough needed overall cough is significantly improved since discharge. Appetite is slowly returning. 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 She is requesting to stay off work for several weeks. On 2 L there is documentation that when she walks 2 minutes up saturation still stays at 95%.   Today in the office pon room air at rest t it is 93%. Walked 40 feet and desaturated to 89% but HR 150/min per tech: I think this is all deconditioning but not sure if she has cardiomyopathy from her illness   04/02/14 Follow up  Pt returns for follow up from recent sever critical illness from streptococcal PNA , Bacteremia, sepsis 02/2014.   She had a right-sided pneumococcal pneumonia with cavitation in the setting of smoking and rheumatoid arthritis on immune modulators Today PFT show FEV1 61% , ratio 87  , FVC 55 %  , NO sign BD response, Diffusing capacity  Decreased 57% Has not smoked.  Wants to return to work soon.  CXR shows persistent RUL/LLL cavitary lesions .  Says she is feeling so much better.  No fever, chest pain, orthopnea, edema or n/v/d.    Good appetite . Seen by cards for tachycardia felt secondary to recent PN   OV 05/01/2014  Chief Complaint  Patient presents with  . Follow-up    Pt states her breathing has improved since last OV. Pt c/o prod cough with yellow and clear mucous and lower mid lumbar pain, pt thinks d/t RA. Pt doing duoneb only once a day.  Pt denies SOB.     Admitted 02/16/2014 through 02/24/2014 with right-sided pneumococcal pneumonia with cavitation in the setting of smoking and rheumatoid arthritis on immune modulators. Current followup is for the same   S: Presents with husband. Feels great overall. No fatigute. No dyspnea. Able to do all ADLs. Wants to return to work now and is asking for release to work. In terms of RA, she feels early morning stiffness of hand joint is returning. SHe is seeing Dr Dierdre Forth 05/01/14 and will discuss with him about return to immunomodulators. There are no other issues. CXR done after she left and at time of this note:    Dg Chest 2 View  04/30/2014   CLINICAL DATA:  Followup cavitary pneumonia  EXAM: CHEST  2 VIEW  COMPARISON:  04/02/2014  FINDINGS: Normal heart size, mediastinal contours, and pulmonary vascularity.  Patchy infiltrates are identified in RIGHT upper lobe and LEFT lower lobe consistent with pneumonia.  Cavitary foci identified previously in the in both the RIGHT upper  and LEFT lower lobes are still seen, the less evident in the RIGHT upper lobe.  Scattered interstitial prominence again identified.  No gross pleural effusion or pneumothorax.  Bones unremarkable.  IMPRESSION: Persistent infiltrates are identified in the RIGHT upper lobe and LEFT lower lobe with persistent visualization of cavitary foci.   Electronically Signed   By: Ulyses Southward M.D.   On: 04/30/2014 19:09      OV 08/09/2014  Chief Complaint  Patient presents with  . Follow-up    Pt here after CT scan. Pt denies change in breathing since last OV. Pt c/o prod cough with clear and green mucus.      Presents for Pneumococcal pneumonia followup that developed in setting of RA and Simponi  She is being maintained on ARAVA since her pneumococcal pneumonia earlier in 2013.  Now she is free of symptoms of respiratory tree. CT 08/08/14 shows further improvement but has architectural distortion. HEr RA is now significantly  active - per her and my talking 08/09/2014 with Dr Dierdre Forth. Patient is very apprehensive of starting ORENCIA recommended by Dr Dierdre Forth due to increased aecopd risk (She does not have copd), recurrent infection esp in setting of focal archiectrual distortion and recent pneumonia. She is very scared of dealing with RA disability v infection risk   Social and past and family: Mom had RA. She is upset abotu her disease  CT 08/08/14  IMPRESSION: 1. Emphysema with scattered scarring, volume loss, scattered bulla (some of which are still inflamed and with a somewhat cavitary appearance), and bilateral airway thickening. Much of this represents residua from prior multi lobar cavitary pneumonia, and is improved compared to the prior July CT scan. Given the degree of architectural distortion, scattered nodularity and opacities, and and irregular scarring, CT is not able to clear the lung of the possibility of malignancy, and surveillance imaging may be warranted.   Electronically Signed  By: [name]  On: 08/08/2014 15:32  Review of Systems    Current outpatient prescriptions: escitalopram (LEXAPRO) 10 MG tablet, Take 1 tablet (10 mg total) by mouth daily., Disp: 90 tablet, Rfl: 0;  leflunomide (ARAVA) 20 MG tablet, Take 1 tablet by mouth daily., Disp: , Rfl:   Objective:   Physical Exam  Constitutional: She is oriented to person, place, and time. She appears well-developed and well-nourished. No distress.  Body mass index is 24.77 kg/(m^2). Has gained weight  HENT:  Head: Normocephalic and atraumatic.  Right Ear: External ear normal.  Left Ear:  External ear normal.  Mouth/Throat: Oropharynx is clear and moist. No oropharyngeal exudate.  Eyes: Conjunctivae and EOM are normal. Pupils are equal, round, and reactive to light. Right eye exhibits no discharge. Left eye exhibits no discharge. No scleral icterus.  Neck: Normal range of motion. Neck supple. No JVD present. No tracheal deviation present. No thyromegaly present.  Cardiovascular: Normal rate, regular rhythm, normal heart sounds and intact distal pulses.  Exam reveals no gallop and no friction rub.   No murmur heard. Pulmonary/Chest: Effort normal and breath sounds normal. No respiratory distress. She has no wheezes. She has no rales. She exhibits no tenderness.  Abdominal: Soft. Bowel sounds are normal. She exhibits no distension and no mass. There is no tenderness. There is no rebound and no guarding.  Musculoskeletal: Normal range of motion. She exhibits no edema or tenderness.  Finger joints swollen and warm  Lymphadenopathy:    She has no cervical adenopathy.  Neurological: She is alert and oriented to  person, place, and time. She has normal reflexes. No cranial nerve deficit. She exhibits normal muscle tone. Coordination normal.  Skin: Skin is warm and dry. No rash noted. She is not diaphoretic. No erythema. No pallor.  Psychiatric: She has a normal mood and affect. Her behavior is normal. Judgment and thought content normal.  Vitals reviewed.     Filed Vitals:   08/09/14 0926  BP: 110/68  Pulse: 80  Weight: 141 lb (63.957 kg)  SpO2: 97%        Assessment & Plan:     ICD-9-CM ICD-10-CM   1. Pneumococcal lobar pneumonia 481 J13   2. Cavitary pneumonia 486 J18.9    518.89 J98.4   3. Rheumatoid arthritis involving multiple joints 714.0 M06.9    #Pneumococcal pneumonia  - glad you are better. clnically resolved.  -CT scan better bu there is architectural distortion that can increase risk for future infection   #RA - very active currently - balancing risk v  benefit: - extensive discusion on this with her and also with Dr Dierdre Forth - worry is really thaat RA can become disabling and target other end organ damage. Overall, felt profile favors starting t ORENCIA and I have given  Dr Dierdre Forth my support on this. After extensive counseling patient understood and verbalized agreement  #FOllowup Return to see me in 2 months or sooner if needed Consider CXR at followup     > 50% of this > 25 min visit spent in face to face counseling or coordination of care (15 min visit converted to 25 min)    Dr. Kalman Shan, M.D., [facility].C.P Pulmonary and Critical Care Medicine Staff Physician [facility] [place] Pulmonary and Critical Care Pager: [phone], If no answer or between  15:00h - 7:00h: call [phone]  08/09/2014 11:42 PM

## 2014-08-09 NOTE — Patient Instructions (Addendum)
#  Pneumococcal pneumonia  - glad you are better. clnically resolved.  -CT scan better bu there is architectural distortion that can increase risk for future infection   #RA - very active currently - balancing risk v benefit: ok to start [facility] per Dr Dierdre Forth  #FOllowup Return to see me in 2 months or sooner if needed

## 2014-08-24 ENCOUNTER — Other Ambulatory Visit: Payer: Self-pay | Admitting: Nurse Practitioner

## 2014-08-26 NOTE — Telephone Encounter (Signed)
Medication refill request: Lexapro 10mg  Last AEX:  06/21/14 Next AEX: 06/27/15 Last MMG (if hormonal medication request): NA Refill authorized: 90 day supply

## 2014-09-26 ENCOUNTER — Encounter: Payer: Self-pay | Admitting: Family Medicine

## 2014-10-04 ENCOUNTER — Ambulatory Visit: Payer: Managed Care, Other (non HMO) | Admitting: Internal Medicine

## 2014-10-10 ENCOUNTER — Telehealth: Payer: Self-pay | Admitting: *Deleted

## 2014-10-10 NOTE — Telephone Encounter (Signed)
Attempted to reach patient at [phone]. Line is busy will try again later. Offer appointment on March 4th at 2:30pm with Dr.Miller.

## 2014-10-10 NOTE — Telephone Encounter (Addendum)
Chart review per Dr Hyacinth Meeker. Call to patient. Advised managing physician reviewed pap results during routine quality audit. Discussed that although pap result normal, viral testing positive and would like to recommend consult and additional testing option. Brief discussion with patient regarding pap smear. Advised MD will discuss in further detail. Office visit scheduled with Dr Hyacinth Meeker for 10-15-14 at 4pm.

## 2014-10-10 NOTE — Telephone Encounter (Signed)
Pt would like to reschedule her 2/9 appointment with Dr Hyacinth Meeker. Would like to reschedule to sometime the 1st week of march because she is off that week.

## 2014-10-10 NOTE — Telephone Encounter (Signed)
Call to patient, left message to call back. Based on call with patient earlier today, prefers Tuesday pm. Appointment rescheduled to Tuesday 11-05-14 at 4pm. Will confirm with patient when returns call.

## 2014-10-11 NOTE — Telephone Encounter (Signed)
Patient returned call. Agreeable to appointment on 11-05-14 at 4pm.  Has additional questions about positive viral test result. Brief discussion regarding positive HR HPV result but currently pap normal so not having any changes to cervical cells at this time. Advised MD will discuss subtyping with patient at OV. Call with additional questions.   Additional recall entered for 11-19-14. Anything else needed or ok to close encounter?

## 2014-10-11 NOTE — Telephone Encounter (Signed)
Agree with plan of care.  Ok to close encounter.

## 2014-10-15 ENCOUNTER — Ambulatory Visit: Payer: Managed Care, Other (non HMO) | Admitting: Obstetrics & Gynecology

## 2014-11-05 ENCOUNTER — Ambulatory Visit (INDEPENDENT_AMBULATORY_CARE_PROVIDER_SITE_OTHER): Payer: Managed Care, Other (non HMO) | Admitting: Obstetrics & Gynecology

## 2014-11-05 ENCOUNTER — Encounter: Payer: Self-pay | Admitting: Obstetrics & Gynecology

## 2014-11-05 VITALS — BP 142/70 | HR 80 | Resp 16 | Wt 140.6 lb

## 2014-11-05 DIAGNOSIS — B977 Papillomavirus as the cause of diseases classified elsewhere: Secondary | ICD-10-CM | POA: Diagnosis not present

## 2014-11-05 NOTE — Progress Notes (Signed)
Subjective:     Patient ID: Kimberly Shelton, female   DOB: 1960/12/11, 54 y.o.   MRN: [id number]  HPI 54 yo G1P1 MWF here for 16/18 HPV testing.  Pt has hx of abnormal Pap with CIN3 several years.  LEEP was done 2008.  Paps were all normal since that time.  HPV testing done with Pap 10/15.  This was signed off as normal but with Pap audit, abnormal HR HPV finding was identified.  Pt was notified and is here for 16/18 testing.  D/W pt findings.  All questions answered.    Review of Systems  All other systems reviewed and are negative.      Objective:   Physical Exam  Constitutional: She is oriented to person, place, and time. She appears well-developed and well-nourished.  Genitourinary: Vagina normal. There is no rash, tenderness or lesion on the right labia. There is no rash, tenderness or lesion on the left labia. Cervix exhibits no motion tenderness, no discharge and no friability.  Lymphadenopathy:       Right: No inguinal adenopathy present.       Left: No inguinal adenopathy present.  Neurological: She is alert and oriented to person, place, and time.  Skin: Skin is warm and dry.  Psychiatric: She has a normal mood and affect.       Assessment:     Normal pap with + HR HPV     Plan:     16/18 testing today.  Pt will be called with results.  She is aware that she will need a colposcopy if this is positive.

## 2014-11-06 ENCOUNTER — Ambulatory Visit (INDEPENDENT_AMBULATORY_CARE_PROVIDER_SITE_OTHER): Payer: Managed Care, Other (non HMO) | Admitting: Internal Medicine

## 2014-11-06 ENCOUNTER — Encounter: Payer: Self-pay | Admitting: Internal Medicine

## 2014-11-06 VITALS — BP 132/76 | HR 77 | Ht 64.0 in | Wt 142.2 lb

## 2014-11-06 DIAGNOSIS — J13 Pneumonia due to Streptococcus pneumoniae: Secondary | ICD-10-CM

## 2014-11-06 DIAGNOSIS — M069 Rheumatoid arthritis, unspecified: Secondary | ICD-10-CM

## 2014-11-06 DIAGNOSIS — R0600 Dyspnea, unspecified: Secondary | ICD-10-CM | POA: Insufficient documentation

## 2014-11-06 MED ORDER — ALBUTEROL SULFATE HFA 108 (90 BASE) MCG/ACT IN AERS: 2.0000 | INHALATION_SPRAY | Freq: Four times a day (QID) | RESPIRATORY_TRACT | Status: DC | PRN

## 2014-11-06 NOTE — Addendum Note (Signed)
Addended by: Nicanor Alcon on: 11/06/2014 04:07 PM   Modules accepted: Orders

## 2014-11-06 NOTE — Patient Instructions (Addendum)
ICD-9-CM ICD-10-CM   1. Pneumococcal lobar pneumonia 481 J13   2. Rheumatoid arthritis involving multiple joints 714.0 M06.9   3. Dyspnea 786.09 R06.00      #Pneumococcal pneumonia  - glad you are better. clnically resolved.  -CT scan better bu there is architectural distortion that can increase risk for future infection - do CXR 1 view in 3 months   #RA - glad is improved with orencia and arava - please talk to Dr Dierdre Forth about bactrim prophylaxis to prevent opportunistic infections  #Shortness of breath  - this could be due to occ wheezing I hear or lung restrictions from scar tissue  - try albuterol 2 puff as needed; if doing well cal [place] in 2 weeks so we can decide on taking scheduled inhalers - full PFT in 3 months  #FOllowup Return to see me in 3  months or sooner if needed

## 2014-11-06 NOTE — Progress Notes (Signed)
Subjective:    Patient ID: Kimberly Shelton, female    DOB: 03-09-1961, 54 y.o.   MRN: [id number]  HPI     OV 03/04/2014 Chief Complaint  Patient presents with  . Follow-up    HFU-D/c from [facility]. Pt states she has chest soreness from coughing. Pt states with the O2 her breathing has improved. C/o dyspnea when working with PT and cough with yellow mucous.    Admitted 02/16/2014 through 02/24/2014 with right-sided pneumococcal pneumonia with cavitation in the setting of smoking and rheumatoid arthritis on immune modulators. At this point in time she is off the immunomodulators. Dr. Dierdre Forth her rheumatologist is aware of this. She is 25% back to her baseline but still very fatigued and short of breath. She coughs a lot with home physical therapy. She brings up green mucus and sputum with her cough needed overall cough is significantly improved since discharge. Appetite is slowly returning. 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 She is requesting to stay off work for several weeks. On 2 L there is documentation that when she walks 2 minutes up saturation still stays at 95%.   Today in the office pon room air at rest t it is 93%. Walked 40 feet and desaturated to 89% but HR 150/min per tech: I think this is all deconditioning but not sure if she has cardiomyopathy from her illness   04/02/14 Follow up  Pt returns for follow up from recent sever critical illness from streptococcal PNA , Bacteremia, sepsis 02/2014.   She had a right-sided pneumococcal pneumonia with cavitation in the setting of smoking and rheumatoid arthritis on immune modulators Today PFT show FEV1 61% , ratio 87  , FVC 55 %  , NO sign BD response, Diffusing capacity  Decreased 57% Has not smoked.  Wants to return to work soon.  CXR shows persistent RUL/LLL cavitary lesions .  Says she is feeling so much better.  No fever, chest pain, orthopnea, edema or n/v/d.   Good appetite . Seen by cards for tachycardia felt secondary to recent PN   OV 05/01/2014  Chief Complaint  Patient presents with  . Follow-up    Pt states her breathing has improved since last OV. Pt c/o prod cough with yellow and clear mucous and lower mid lumbar pain, pt thinks d/t RA. Pt doing duoneb only once a day.  Pt denies SOB.     Admitted 02/16/2014 through 02/24/2014 with right-sided pneumococcal pneumonia with cavitation in the setting of smoking and rheumatoid arthritis on immune modulators. Current followup is for the same   S: Presents with husband. Feels great overall. No fatigute. No dyspnea. Able to do all ADLs. Wants to return to work now and is asking for release to work. In terms of RA, she feels early morning stiffness of hand joint is returning. SHe is seeing Dr Dierdre Forth 05/01/14 and will discuss with him about return to immunomodulators. There are no other issues. CXR done after she left and at time of this note:    Dg Chest 2 View  04/30/2014   CLINICAL DATA:  Followup cavitary pneumonia  EXAM: CHEST  2 VIEW  COMPARISON:  04/02/2014  FINDINGS: Normal heart size, mediastinal contours, and pulmonary vascularity.  Patchy infiltrates are identified in RIGHT upper lobe and LEFT lower lobe consistent with pneumonia.  Cavitary foci identified previously in the in both the RIGHT upper  and LEFT lower lobes are still seen, the less evident in the RIGHT upper lobe.  Scattered interstitial prominence again identified.  No gross pleural effusion or pneumothorax.  Bones unremarkable.  IMPRESSION: Persistent infiltrates are identified in the RIGHT upper lobe and LEFT lower lobe with persistent visualization of cavitary foci.   Electronically Signed   By: Ulyses Southward M.D.   On: 04/30/2014 19:09      OV 08/09/2014  Chief Complaint  Patient presents with  . Follow-up    Pt here after CT scan. Pt denies change in breathing since last OV. Pt c/o prod cough with clear and green mucus.      Presents for Pneumococcal pneumonia followup that developed in setting of RA and Simponi  She is being maintained on ARAVA since her pneumococcal pneumonia earlier in 2013.  Now she is free of symptoms of respiratory tree. CT 08/08/14 shows further improvement but has architectural distortion. HEr RA is now significantly  active - per her and my talking 08/09/2014 with Dr Dierdre Forth. Patient is very apprehensive of starting ORENCIA recommended by Dr Dierdre Forth due to increased aecopd risk (She does not have copd), recurrent infection esp in setting of focal archiectrual distortion and recent pneumonia. She is very scared of dealing with RA disability v infection risk   Social and past and family: Mom had RA. She is upset abotu her disease  CT 08/08/14  IMPRESSION: 1. Emphysema with scattered scarring, volume loss, scattered bulla (some of which are still inflamed and with a somewhat cavitary appearance), and bilateral airway thickening. Much of this represents residua from prior multi lobar cavitary pneumonia, and is improved compared to the prior July CT scan. Given the degree of architectural distortion, scattered nodularity and opacities, and and irregular scarring, CT is not able to clear the lung of the possibility of malignancy, and surveillance imaging may be warranted.   Electronically Signed  By: [name]  On: 08/08/2014 15:32  OV 11/06/2014  Chief Complaint  Patient presents with  . Follow-up    Coughing up whitish colored phlegm, SOB with walking, not always, sometimes.      Presents for Pneumococcal pneumonia followup that developed in setting of RA and Simponi June 2015, with resultant architectural distortion in her lung anatomy  Last seen December 2015. Since then overall she's doing well. At baseline she only has mild cough with occasional white sputum. She also has exertional dyspnea when she climbs up a hill but clearly in cold air he did this is  relieved by rest. She is currently on 2 immunomodulators for her rheumatoid arthritis Orencia and Arava and this is helping her joints. She's not had any respiratory complications so far. I notice that she is not on Bactrim prophylaxis but she is concerned about the side effects. And she wants to talk to her rheumatologist Dr. Dierdre Forth about this  Past, Family, Social reviewed: no change since last visit    Review of Systems  Constitutional: Negative for fever and unexpected weight change.  HENT: Positive for ear pain. Negative for congestion, dental problem, nosebleeds, postnasal drip, rhinorrhea, sinus pressure, sneezing, sore throat and trouble swallowing.   Eyes: Negative for redness and itching.  Respiratory: Positive for cough, chest tightness and shortness of breath. Negative for wheezing.   Cardiovascular: Negative for palpitations and leg swelling.  Gastrointestinal: Negative for nausea and vomiting.  Genitourinary: Negative for dysuria.  Musculoskeletal: Negative for joint swelling.  Skin: Negative for rash.  Neurological: Negative for headaches.  Hematological: Does not bruise/bleed easily.  Psychiatric/Behavioral: Negative for dysphoric mood. The patient is not nervous/anxious.     Current outpatient prescriptions:  .  escitalopram (LEXAPRO) 10 MG tablet, TAKE 1 TABLET BY MOUTH EVERY DAY, Disp: 90 tablet, Rfl: 3 .  leflunomide (ARAVA) 20 MG tablet, Take 1 tablet by mouth daily., Disp: , Rfl:  .  ORENCIA 250 MG injection, , Disp: , Rfl:       Objective:   Physical Exam  Constitutional: She is oriented to person, place, and time. She appears well-developed and well-nourished. No distress.  HENT:  Head: Normocephalic and atraumatic.  Right Ear: External ear normal.  Left Ear: External ear normal.  Mouth/Throat: Oropharynx is clear and moist. No oropharyngeal exudate.  Eyes: Conjunctivae and EOM are normal. Pupils are equal, round, and reactive to light. Right eye exhibits  no discharge. Left eye exhibits no discharge. No scleral icterus.  Neck: Normal range of motion. Neck supple. No JVD present. No tracheal deviation present. No thyromegaly present.  Cardiovascular: Normal rate, regular rhythm, normal heart sounds and intact distal pulses.  Exam reveals no gallop and no friction rub.   No murmur heard. Pulmonary/Chest: Effort normal and breath sounds normal. No respiratory distress. She has no wheezes. She has no rales. She exhibits no tenderness.  Occasional scattered? Wheeze  Abdominal: Soft. Bowel sounds are normal. She exhibits no distension and no mass. There is no tenderness. There is no rebound and no guarding.  Musculoskeletal: Normal range of motion. She exhibits no edema or tenderness.  Lymphadenopathy:    She has no cervical adenopathy.  Neurological: She is alert and oriented to person, place, and time. She has normal reflexes. No cranial nerve deficit. She exhibits normal muscle tone. Coordination normal.  Skin: Skin is warm and dry. No rash noted. She is not diaphoretic. No erythema. No pallor.  Psychiatric: She has a normal mood and affect. Her behavior is normal. Judgment and thought content normal.  Vitals reviewed.   Filed Vitals:   11/06/14 1519  BP: 132/76  Pulse: 77  Height: 5\' 4"  (1.626 m)  Weight: 142 lb 3.2 oz (64.501 kg)  SpO2: 96%         Assessment & Plan:     ICD-9-CM ICD-10-CM   1. Pneumococcal lobar pneumonia 481 J13 Pulmonary function test  2. Rheumatoid arthritis involving multiple joints 714.0 M06.9 Pulmonary function test  3. Dyspnea 786.09 R06.00 Pulmonary function test     #Pneumococcal pneumonia  - glad you are better. clnically resolved.  -CT scan better bu there is architectural distortion that can increase risk for future infection - do CXR 1 view in 3 months   #RA - glad is improved with orencia and arava - please talk to Dr about bactrim prophylaxis to prevent opportunistic  infections  #Shortness of breath  - this could be due to occ wheezing I hear or lung restrictions from scar tissue  - try albuterol 2 puff as needed; if doing well cal Dierdre Forth in 2 weeks so we can decide on taking scheduled inhalers - full PFT in 3 months  #FOllowup Return to see me in 3  months or sooner if needed    Dr. [place], M.D., [facility].C.P Pulmonary and Critical Care Medicine Staff Physician [facility] Pager: [phone], If no answer or between  15:00h - 7:00h: call [phone]  11/06/2014 3:59 PM

## 2014-11-09 LAB — IPS HPV GENOTYPING 16/18

## 2015-02-10 ENCOUNTER — Ambulatory Visit (INDEPENDENT_AMBULATORY_CARE_PROVIDER_SITE_OTHER)
Admission: RE | Admit: 2015-02-10 | Discharge: 2015-02-10 | Disposition: A | Discharge status: Home / Self Care | Payer: Managed Care, Other (non HMO) | Source: Ambulatory Visit | Attending: Internal Medicine | Admitting: Internal Medicine

## 2015-02-10 ENCOUNTER — Ambulatory Visit (INDEPENDENT_AMBULATORY_CARE_PROVIDER_SITE_OTHER): Payer: Managed Care, Other (non HMO) | Admitting: Internal Medicine

## 2015-02-10 ENCOUNTER — Encounter: Payer: Self-pay | Admitting: Internal Medicine

## 2015-02-10 VITALS — BP 102/60 | HR 76 | Ht 63.0 in | Wt 146.0 lb

## 2015-02-10 DIAGNOSIS — R0600 Dyspnea, unspecified: Secondary | ICD-10-CM

## 2015-02-10 DIAGNOSIS — J13 Pneumonia due to Streptococcus pneumoniae: Secondary | ICD-10-CM

## 2015-02-10 DIAGNOSIS — M069 Rheumatoid arthritis, unspecified: Secondary | ICD-10-CM

## 2015-02-10 LAB — PULMONARY FUNCTION TEST
DL/VA % pred: 107 %
DL/VA: 5.01 ml/min/mmHg/L
DLCO UNC % PRED: 80 %
DLCO unc: 18.5 ml/min/mmHg
FEF 25-75 POST: 1.6 L/s
FEF 25-75 Pre: 1.1 L/sec
FEF2575-%CHANGE-POST: 44 %
FEF2575-%PRED-PRE: 44 %
FEF2575-%Pred-Post: 64 %
FEV1-%CHANGE-POST: 12 %
FEV1-%PRED-PRE: 64 %
FEV1-%Pred-Post: 73 %
FEV1-Post: 1.89 L
FEV1-Pre: 1.68 L
FEV1FVC-%Change-Post: 14 %
FEV1FVC-%Pred-Pre: 87 %
FEV6-%Change-Post: -1 %
FEV6-%PRED-POST: 74 %
FEV6-%Pred-Pre: 75 %
FEV6-PRE: 2.42 L
FEV6-Post: 2.4 L
FEV6FVC-%PRED-PRE: 103 %
FEV6FVC-%Pred-Post: 103 %
FVC-%CHANGE-POST: -1 %
FVC-%PRED-POST: 72 %
FVC-%Pred-Pre: 73 %
FVC-POST: 2.4 L
FVC-Pre: 2.42 L
PRE FEV6/FVC RATIO: 100 %
Post FEV1/FVC ratio: 79 %
Post FEV6/FVC ratio: 100 %
Pre FEV1/FVC ratio: 69 %
RV % PRED: 68 %
RV: 1.26 L
TLC % PRED: 76 %
TLC: 3.76 L

## 2015-02-10 MED ORDER — TIOTROPIUM BROMIDE MONOHYDRATE 18 MCG IN CAPS: 18.0000 ug | ORAL_CAPSULE | Freq: Every day | RESPIRATORY_TRACT | Status: DC

## 2015-02-10 NOTE — Progress Notes (Signed)
PFT done today. 

## 2015-02-10 NOTE — Patient Instructions (Addendum)
ICD-9-CM ICD-10-CM   1. Pneumococcal lobar pneumonia 481 J13   2. Dyspnea 786.09 R06.00     #Pneumococcal pneumonia with residual architectural distortion  - glad you are better. clnically resolved.  - do CXR 2 view today and if stable then only CXR as needed  #Shortness of breath  - this could be due to occ wheezing which is due to emphysema and  lung restrictions from scar tissue - Please start spiriva 1 puff daily - take  script and show technique  - continue  albuterol 2 puff as needed   #FOllowup Flu shot in the fall on your own Return to see me in 6  months or sooner if needed

## 2015-02-10 NOTE — Progress Notes (Signed)
Subjective:    Patient ID: Kimberly Shelton, female    DOB: 06/02/1961, 54 y.o.   MRN: [id number]  HPI       OV 03/04/2014 Chief Complaint  Patient presents with  . Follow-up    HFU-D/c from [facility] Pt states she has chest soreness from coughing. Pt states with the O2 her breathing has improved. C/o dyspnea when working with PT and cough with yellow mucous.    Admitted 02/16/2014 through 02/24/2014 with right-sided pneumococcal pneumonia with cavitation in the setting of smoking and rheumatoid arthritis on immune modulators. At this point in time she is off the immunomodulators. Dr. Dierdre Forth her rheumatologist is aware of this. She is 25% back to her baseline but still very fatigued and short of breath. She coughs a lot with home physical therapy. She brings up green mucus and sputum with her cough needed overall cough is significantly improved since discharge. Appetite is slowly returning. 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 She is requesting to stay off work for several weeks. On 2 L there is documentation that when she walks 2 minutes up saturation still stays at 95%.   Today in the office pon room air at rest t it is 93%. Walked 40 feet and desaturated to 89% but HR 150/min per tech: I think this is all deconditioning but not sure if she has cardiomyopathy from her illness   04/02/14 Follow up  Pt returns for follow up from recent sever critical illness from streptococcal PNA , Bacteremia, sepsis 02/2014.   She had a right-sided pneumococcal pneumonia with cavitation in the setting of smoking and rheumatoid arthritis on immune modulators Today PFT show FEV1 61% , ratio 87  , FVC 55 %  , NO sign BD response, Diffusing capacity  Decreased 57% Has not smoked.  Wants to return to work soon.  CXR shows persistent RUL/LLL cavitary lesions .  Says she is feeling so much better.  No fever, chest pain, orthopnea, edema or  n/v/d.  Good appetite . Seen by cards for tachycardia felt secondary to recent PN   OV 05/01/2014  Chief Complaint  Patient presents with  . Follow-up    Pt states her breathing has improved since last OV. Pt c/o prod cough with yellow and clear mucous and lower mid lumbar pain, pt thinks d/t RA. Pt doing duoneb only once a day.  Pt denies SOB.     Admitted 02/16/2014 through 02/24/2014 with right-sided pneumococcal pneumonia with cavitation in the setting of smoking and rheumatoid arthritis on immune modulators. Current followup is for the same   S: Presents with husband. Feels great overall. No fatigute. No dyspnea. Able to do all ADLs. Wants to return to work now and is asking for release to work. In terms of RA, she feels early morning stiffness of hand joint is returning. SHe is seeing Dr Dierdre Forth 05/01/14 and will discuss with him about return to immunomodulators. There are no other issues. CXR done after she left and at time of this note:    Dg Chest 2 View  04/30/2014   CLINICAL DATA:  Followup cavitary pneumonia  EXAM: CHEST  2 VIEW  COMPARISON:  04/02/2014  FINDINGS: Normal heart size, mediastinal contours, and pulmonary vascularity.  Patchy infiltrates are identified in RIGHT upper lobe and LEFT lower lobe consistent with pneumonia.  Cavitary foci identified previously in the in both the  RIGHT upper and LEFT lower lobes are still seen, the less evident in the RIGHT upper lobe.  Scattered interstitial prominence again identified.  No gross pleural effusion or pneumothorax.  Bones unremarkable.  IMPRESSION: Persistent infiltrates are identified in the RIGHT upper lobe and LEFT lower lobe with persistent visualization of cavitary foci.   Electronically Signed   By: Ulyses Southward M.D.   On: 04/30/2014 19:09      OV 08/09/2014  Chief Complaint  Patient presents with  . Follow-up    Pt here after CT scan. Pt denies change in breathing since last OV. Pt c/o prod cough with clear and green  mucus.     Presents for Pneumococcal pneumonia followup that developed in setting of RA and Simponi  She is being maintained on ARAVA since her pneumococcal pneumonia earlier in 2013.  Now she is free of symptoms of respiratory tree. CT 08/08/14 shows further improvement but has architectural distortion. HEr RA is now significantly  active - per her and my talking 08/09/2014 with Dr Dierdre Forth. Patient is very apprehensive of starting ORENCIA recommended by Dr Dierdre Forth due to increased aecopd risk (She does not have copd), recurrent infection esp in setting of focal archiectrual distortion and recent pneumonia. She is very scared of dealing with RA disability v infection risk   Social and past and family: Mom had RA. She is upset abotu her disease  CT 08/08/14  IMPRESSION: 1. Emphysema with scattered scarring, volume loss, scattered bulla (some of which are still inflamed and with a somewhat cavitary appearance), and bilateral airway thickening. Much of this represents residua from prior multi lobar cavitary pneumonia, and is improved compared to the prior July CT scan. Given the degree of architectural distortion, scattered nodularity and opacities, and and irregular scarring, CT is not able to clear the lung of the possibility of malignancy, and surveillance imaging may be warranted.   Electronically Signed  By: [name]  On: 08/08/2014 15:32  OV 11/06/2014  Chief Complaint  Patient presents with  . Follow-up    Coughing up whitish colored phlegm, SOB with walking, not always, sometimes.      Presents for Pneumococcal pneumonia followup that developed in setting of RA and Simponi June 2015, with resultant architectural distortion in her lung anatomy  Last seen December 2015. Since then overall she's doing well. At baseline she only has mild cough with occasional white sputum. She also has exertional dyspnea when she climbs up a hill but clearly in cold air he did this  is relieved by rest. She is currently on 2 immunomodulators for her rheumatoid arthritis Orencia and Arava and this is helping her joints. She's not had any respiratory complications so far. I notice that she is not on Bactrim prophylaxis but she is concerned about the side effects. And she wants to talk to her rheumatologist Dr. Dierdre Forth about this  Past, Family, Social reviewed: no change since last visit   OV 02/10/2015  Chief Complaint  Patient presents with  . Follow-up    Pt stated her breathing feels it has improved since last OV. Pt here after PFT. Pt stated she has a little more trouble breathing when it is hot out. Pt c/o mild prod cough with clear and small light green mucus. Pt denies CP/tightness.    Follow-up chronic destructive pulmonary to changes following pneumococcal pneumonia in 2015 in the setting of previous heavy smoking and immunomodulatory treatment for rheumatoid arthritis   Since seeing me last in  March 2016 she feels that the albuterol inhaler that I tried for dyspnea and exertion and wheezing has helped. Dyspnea still persists. It is unchanged. It is mild. However it is improved with albuterol which she takes prior to exertion. There is no associated cough or. Wheezing is improved. No hemoptysis no fever no chills .Outside chart from Dr. Dierdre Forth her rheumatologist dated 02/04/2015 reviewed. She reported to him that she is compliant on Ornecia and arava with good relief and joint pain. Her dyspnea is mild chronic and stable. His advice for her to continue the same. He suggested that I look into her chronic residual dyspnea at this visit   Last pulmonary imaging with CT scan of the chest December 2015:residual changes + along with emphysema; report reviewed. Image not visualized  Pulmonary function test 02/10/2015 today personally reviewed image: FEV1 prebronchodilator 1.7 L/64%, ratio 69.  FEV1 postbronchodilator is 1.9 L/72% and is a 12% positive response with  broncho-dilator., FVC 2.4 L/72% and ratio of 79. Total lung capacity is 3.76 L/76%. DLCO is 18.5/80%. PFTs are consistent with restriction and a normal DLCO but in the prebronchodilator she has mild obstruction    Review of Systems  Constitutional: Negative for fever and unexpected weight change.  HENT: Negative for congestion, dental problem, ear pain, nosebleeds, postnasal drip, rhinorrhea, sinus pressure, sneezing, sore throat and trouble swallowing.   Eyes: Negative for redness and itching.  Respiratory: Positive for cough and shortness of breath. Negative for chest tightness and wheezing.   Cardiovascular: Negative for palpitations and leg swelling.  Gastrointestinal: Negative for nausea and vomiting.  Genitourinary: Negative for dysuria.  Musculoskeletal: Negative for joint swelling.  Skin: Negative for rash.  Neurological: Negative for headaches.  Hematological: Does not bruise/bleed easily.  Psychiatric/Behavioral: Negative for dysphoric mood. The patient is not nervous/anxious.        Objective:   Physical Exam  Filed Vitals:   02/10/15 1409  BP: 102/60  Pulse: 76  Height: 5\' 3"  (1.6 m)  Weight: 146 lb (66.225 kg)  SpO2: 97%   Constitutional: She is oriented to person, place, and time. She appears well-developed and well-nourished. No distress.  HENT:  Head: Normocephalic and atraumatic.  Right Ear: External ear normal.  Left Ear: External ear normal.  Mouth/Throat: Oropharynx is clear and moist. No oropharyngeal exudate.  Eyes: Conjunctivae and EOM are normal. Pupils are equal, round, and reactive to light. Right eye exhibits no discharge. Left eye exhibits no discharge. No scleral icterus.  Neck: Normal range of motion. Neck supple. No JVD present. No tracheal deviation present. No thyromegaly present.  Cardiovascular: Normal rate, regular rhythm, normal heart sounds and intact distal pulses.  Exam reveals no gallop and no friction rub.   No murmur  heard. Pulmonary/Chest: Effort normal and breath sounds normal. No respiratory distress. She has no wheezes. She has no rales. She exhibits no tenderness.  No wheeze this time Abdominal: Soft. Bowel sounds are normal. She exhibits no distension and no mass. There is no tenderness. There is no rebound and no guarding.  Musculoskeletal: Normal range of motion. She exhibits no edema or tenderness.  Lymphadenopathy:    She has no cervical adenopathy.  Neurological: She is alert and oriented to person, place, and time. She has normal reflexes. No cranial nerve deficit. She exhibits normal muscle tone. Coordination normal.  Skin: Skin is warm and dry. No rash noted. She is not diaphoretic. No erythema. No pallor.  Psychiatric: She has a normal mood and affect. Her behavior  is normal. Judgment and thought content normal.  Vitals reviewed.        Assessment & Plan:     ICD-9-CM ICD-10-CM   1. Pneumococcal lobar pneumonia 481 J13   2. Dyspnea 786.09 R06.00    #Pneumococcal pneumonia with residual architectural distortion  - glad you are better. clnically resolved.  - do CXR 2 view today and if stable then only CXR as needed  #Shortness of breath - improvd wit alb inh  - this is due to occ wheezing which is in turn due to emphysema and  lung restrictions from scar tissue (diagnosis based on PFT 02/10/2015 and prior CT chest) - Please start spiriva 1 puff daily - take  script and show technique  - continue  albuterol 2 puff as needed   #FOllowup Flu shot in the fall on your own Return to see me in 6  months or sooner if needed     Dr. Kalman Shan, M.D., [facility].C.P Pulmonary and Critical Care Medicine Staff Physician [facility] Pager: [phone], If no answer or between  15:00h - 7:00h: call [phone]  02/10/2015 2:56 PM

## 2015-02-12 ENCOUNTER — Telehealth: Payer: Self-pay | Admitting: Internal Medicine

## 2015-02-12 NOTE — Telephone Encounter (Signed)
Patient notified of CXR results. Nothing further needed.  

## 2015-03-18 ENCOUNTER — Ambulatory Visit (INDEPENDENT_AMBULATORY_CARE_PROVIDER_SITE_OTHER): Payer: Managed Care, Other (non HMO) | Admitting: Cardiovascular Disease

## 2015-03-18 ENCOUNTER — Encounter: Payer: Self-pay | Admitting: Cardiovascular Disease

## 2015-03-18 VITALS — BP 122/60 | HR 72 | Ht 63.0 in | Wt 144.8 lb

## 2015-03-18 DIAGNOSIS — F17201 Nicotine dependence, unspecified, in remission: Secondary | ICD-10-CM | POA: Diagnosis not present

## 2015-03-18 DIAGNOSIS — I471 Supraventricular tachycardia: Secondary | ICD-10-CM

## 2015-03-18 DIAGNOSIS — R000 Tachycardia, unspecified: Secondary | ICD-10-CM

## 2015-03-18 NOTE — Progress Notes (Signed)
Chief Complaint  Patient presents with  . Shortness of Breath     History of Present Illness: 54 yo female with history of RA, pneumonia here today for cardiac follow up. I saw her 03/06/14 for evaluation of tachycardia. She was admitted to [facility] 02/16/2014 through 02/24/2014 with right-sided pneumococcal pneumonia with cavitation in the setting of smoking and rheumatoid arthritis on immune modulators. She is being followed by pulmonary. She was seen 03/04/14 by Dr. [name] and HR noted to be 111 bpm. Echo 02/18/14 with normal LV function, no evidence of pericardial effusion. It was felt that her tachycardia was in response to her acute illness and lung disease.   She is here today for follow up. She has no chest pain or SOB. She has stopped smoking.   Primary Care Physician: Burchette  Past Medical History  Diagnosis Date  . Abnormal Pap smear 09/2006    HGSIL CIN 2/VAIN/ CIN 3/VAIN-3 CIS  . RA (rheumatoid arthritis) 07/2009  . Pneumonia     Past Surgical History  Procedure Laterality Date  . Cervical biopsy  w/ loop electrode excision  2008    CIN 3  . Pilonidal cyst excision  1999    I&D    Current Outpatient Prescriptions  Medication Sig Dispense Refill  . albuterol (PROVENTIL HFA;VENTOLIN HFA) 108 (90 BASE) MCG/ACT inhaler Inhale 2 puffs into the lungs every 6 (six) hours as needed for wheezing or shortness of breath. 1 Inhaler 6  . escitalopram (LEXAPRO) 10 MG tablet TAKE 1 TABLET BY MOUTH EVERY DAY 90 tablet 3  . leflunomide (ARAVA) 20 MG tablet Take 1 tablet by mouth daily.    Marland Kitchen ORENCIA 250 MG injection Inject 250 mg into the vein every 30 (thirty) days.     Marland Kitchen tiotropium (SPIRIVA) 18 MCG inhalation capsule Place 1 capsule (18 mcg total) into inhaler and inhale daily. 30 capsule 6   No current facility-administered medications for this visit.    Allergies  Allergen Reactions  . Bee Venom   . Codeine     "seeing spots"  . Penicillins     REACTION:  rash, itiching    History   Social History  . Marital Status: Married    Spouse Name: N/A  . Number of Children: 1  . Years of Education: N/A   Occupational History  . Analyst II  [employer]   Social History Main Topics  . Smoking status: Former Smoker -- 1.00 packs/day for 30 years    Types: Cigarettes    Quit date: 02/16/2014  . Smokeless tobacco: Never Used  . Alcohol Use: Yes  . Drug Use: No  . Sexual Activity:    Partners: Male    Birth Control/ Protection: Post-menopausal   Other Topics Concern  . Not on file   Social History Narrative    Family History  Problem Relation Age of Onset  . Diabetes Mother   . Rheum arthritis Mother   . Diabetes Father   . COPD Father   . Bronchitis Father   . Cancer Brother     Lung  . CAD Neg Hx     Review of Systems:  As stated in the HPI and otherwise negative.   BP 122/60 mmHg  Pulse 72  Ht 5\' 3"  (1.6 m)  Wt 144 lb 12.8 oz (65.681 kg)  BMI 25.66 kg/m2  LMP 09/06/2004  Physical Examination: General: Well developed, well nourished, NAD HEENT: OP clear, mucus membranes moist  SKIN: warm, dry. No rashes. Neuro: No focal deficits Musculoskeletal: Muscle strength 5/5 all ext Psychiatric: Mood and affect normal Neck: No JVD, no carotid bruits, no thyromegaly, no lymphadenopathy. Lungs:Clear bilaterally, no wheezes, rhonci, crackles Cardiovascular: Regular rate and rhythm. No murmurs, gallops or rubs. Abdomen:Soft. Bowel sounds present. Non-tender.  Extremities: No lower extremity edema. Pulses are 2 + in the bilateral DP/PT.  Echo 02/18/14: Procedure narrative: Transthoracic echocardiography. Image quality was adequate. The study was technically difficult. - Left ventricle: The cavity size was normal. Wall thickness was normal. Systolic function was vigorous. The estimated ejection fraction was in the range of 65% to 70%. Wall motion was normal; there were no regional wall motion abnormalities.  Doppler parameters are consistent with abnormal left ventricular relaxation (grade 1 diastolic dysfunction).  EKG:  EKG is ordered today. The ekg ordered today demonstrates NSR, rate 72 bpm. Non-specific T wave abn  Recent Labs: No results found for requested labs within last 365 days.   Lipid Panel No results found for: CHOL, TRIG, HDL, CHOLHDL, VLDL, LDLCALC, LDLDIRECT   Wt Readings from Last 3 Encounters:  03/18/15 144 lb 12.8 oz (65.681 kg)  02/10/15 146 lb (66.225 kg)  11/06/14 142 lb 3.2 oz (64.501 kg)     Other studies Reviewed: Additional studies/ records that were reviewed today include: . Review of the above records demonstrates:    Assessment and Plan:   1. Tachycardia: Resolved after treatment for pneumonia. Echo 02/18/14 with normal LV function and no evidence of pericardial effusion.   2. Tobacco abuse, in remission: She has stopped smoking.   Current medicines are reviewed at length with the patient today.  The patient does not have concerns regarding medicines.  The following changes have been made:  no change  Labs/ tests ordered today include:  No orders of the defined types were placed in this encounter.    Disposition:   FU with me as needed.   Signed, Verne Carrow, MD 03/18/2015 3:49 PM    [facility] [address] [place], [place], [place]  [address] Phone: [phone]; Fax: [phone]

## 2015-03-18 NOTE — Patient Instructions (Signed)
Medication Instructions:  Your physician recommends that you continue on your current medications as directed. Please refer to the Current Medication list given to you today.   Labwork: None  Testing/Procedures: None  Follow-Up: Your physician wants you to follow-up in: 12 months.  You will receive a reminder letter in the mail two months in advance. If you don't receive a letter, please call our office to schedule the follow-up appointment.       

## 2015-03-19 ENCOUNTER — Other Ambulatory Visit (HOSPITAL_COMMUNITY): Payer: Self-pay | Admitting: Oncology

## 2015-06-07 DIAGNOSIS — S92902A Unspecified fracture of left foot, initial encounter for closed fracture: Secondary | ICD-10-CM

## 2015-06-07 HISTORY — DX: Unspecified fracture of left foot, initial encounter for closed fracture: S92.902A

## 2015-06-16 ENCOUNTER — Other Ambulatory Visit: Payer: Self-pay

## 2015-06-16 DIAGNOSIS — Z1231 Encounter for screening mammogram for malignant neoplasm of breast: Secondary | ICD-10-CM

## 2015-06-25 ENCOUNTER — Encounter: Payer: Self-pay | Admitting: Family Medicine

## 2015-06-25 ENCOUNTER — Telehealth: Payer: Self-pay | Admitting: Family Medicine

## 2015-06-25 ENCOUNTER — Ambulatory Visit (INDEPENDENT_AMBULATORY_CARE_PROVIDER_SITE_OTHER)
Admission: RE | Admit: 2015-06-25 | Discharge: 2015-06-25 | Disposition: A | Discharge status: Home / Self Care | Payer: Managed Care, Other (non HMO) | Source: Ambulatory Visit | Attending: Family Medicine | Admitting: Family Medicine

## 2015-06-25 ENCOUNTER — Ambulatory Visit (INDEPENDENT_AMBULATORY_CARE_PROVIDER_SITE_OTHER): Payer: Managed Care, Other (non HMO) | Admitting: Family Medicine

## 2015-06-25 VITALS — BP 122/64 | HR 88 | Temp 97.7°F | Wt 148.3 lb

## 2015-06-25 DIAGNOSIS — M79672 Pain in left foot: Secondary | ICD-10-CM

## 2015-06-25 DIAGNOSIS — Z23 Encounter for immunization: Secondary | ICD-10-CM

## 2015-06-25 NOTE — Telephone Encounter (Signed)
Pt would like results of foot xray if they are back.

## 2015-06-25 NOTE — Progress Notes (Signed)
   Subjective:    Patient ID: Kimberly Shelton, female    DOB: 11/26/1960, 54 y.o.   MRN: [id number]  HPI Patient seen for acute visit for left foot pain and swelling. Onset last Wednesday. She has been on her feet a lot but does not recall any specific injury. She had some edema around her ankle. She has history of rheumatoid arthritis. She called her rheumatologist and will start on prednisone taper last week which has not helped any with her edema. She has not noted any bruising. Her pain is mostly centered around the distal aspect of the foot dorsally. No ankle pain. No leg pain. Some pain in the foot with weightbearing.  Past Medical History  Diagnosis Date  . Abnormal Pap smear 09/2006    HGSIL CIN 2/VAIN/ CIN 3/VAIN-3 CIS  . RA (rheumatoid arthritis) (HCC) 07/2009  . Pneumonia    Past Surgical History  Procedure Laterality Date  . Cervical biopsy  w/ loop electrode excision  2008    CIN 3  . Pilonidal cyst excision  1999    I&D    reports that she quit smoking about 16 months ago. Her smoking use included Cigarettes. She has a 30 pack-year smoking history. She has never used smokeless tobacco. She reports that she drinks alcohol. She reports that she does not use illicit drugs. family history includes Bronchitis in her father; COPD in her father; Cancer in her brother; Diabetes in her father and mother; Rheum arthritis in her mother. There is no history of CAD. Allergies  Allergen Reactions  . Bee Venom   . Codeine     "seeing spots"  . Penicillins     REACTION: rash, itiching      Review of Systems  Constitutional: Negative for fever and chills.  Neurological: Negative for weakness.       Objective:   Physical Exam  Constitutional: She appears well-developed and well-nourished.  Cardiovascular: Normal rate and regular rhythm.   Pulmonary/Chest: Effort normal.  Musculoskeletal:  Left foot is edematous dorsally. She has some mild left ankle edema. No increased warmth.  No ecchymosis. No erythema. Normal distal pulses. She has no distal leg or ankle tenderness. She has some tenderness over the distal aspect of the third metatarsal.          Assessment & Plan:  Left foot edema and pain. She has tenderness localized over the third metatarsal. Rule out stress fracture. Obtain x-rays. We explained that stress fractures frequently do not show up early on. Even if x-ray negative, consider repeat x-ray in a couple of weeks if pain persists.

## 2015-06-25 NOTE — Progress Notes (Signed)
Pre visit review using our clinic review tool, if applicable. No additional management support is needed unless otherwise documented below in the visit note. 

## 2015-06-26 ENCOUNTER — Ambulatory Visit (INDEPENDENT_AMBULATORY_CARE_PROVIDER_SITE_OTHER): Payer: Managed Care, Other (non HMO) | Admitting: *Deleted

## 2015-06-26 DIAGNOSIS — M069 Rheumatoid arthritis, unspecified: Secondary | ICD-10-CM | POA: Diagnosis not present

## 2015-06-26 NOTE — Progress Notes (Signed)
   Subjective:    Patient ID: Kimberly Shelton, female    DOB: [birth year], 54 y.o.   MRN: [id number]  HPI    Review of Systems     Objective:   Physical Exam        Assessment & Plan:  Patient came in for a post-op shoe.

## 2015-06-27 ENCOUNTER — Ambulatory Visit: Payer: Managed Care, Other (non HMO) | Admitting: Nurse Practitioner

## 2015-07-04 ENCOUNTER — Ambulatory Visit (INDEPENDENT_AMBULATORY_CARE_PROVIDER_SITE_OTHER): Payer: Managed Care, Other (non HMO) | Admitting: Family Medicine

## 2015-07-04 ENCOUNTER — Encounter: Payer: Self-pay | Admitting: Family Medicine

## 2015-07-04 VITALS — BP 122/70 | HR 75 | Temp 98.4°F

## 2015-07-04 DIAGNOSIS — S92302D Fracture of unspecified metatarsal bone(s), left foot, subsequent encounter for fracture with routine healing: Secondary | ICD-10-CM | POA: Diagnosis not present

## 2015-07-04 DIAGNOSIS — Z13220 Encounter for screening for lipoid disorders: Secondary | ICD-10-CM | POA: Diagnosis not present

## 2015-07-04 NOTE — Patient Instructions (Signed)
Metatarsal Fracture A metatarsal fracture is a break in a metatarsal bone. Metatarsal bones connect your toe bones to your ankle bones. CAUSES This type of fracture may be caused by:  A sudden twisting of your foot.  A fall onto your foot.  Overuse or repetitive exercise. RISK FACTORS This condition is more likely to develop in people who:  Play contact sports.  Have a bone disease.  Have a low calcium level. SYMPTOMS Symptoms of this condition include:  Pain that is worse when walking or standing.  Pain when pressing on the foot or moving the toes.  Swelling.  Bruising on the top or bottom of the foot.  A foot that appears shorter than the other one. DIAGNOSIS This condition is diagnosed with a physical exam. You may also have imaging tests, such as:  X-rays.  A CT scan.  MRI. TREATMENT Treatment for this condition depends on its severity and whether a bone has moved out of place. Treatment may involve:  Rest.  Wearing foot support such as a cast, splint, or boot for several weeks.  Using crutches.  Surgery to move bones back into the right position. Surgery is usually needed if there are many pieces of broken bone or bones that are very out of place (displaced fracture).  Physical therapy. This may be needed to help you regain full movement and strength in your foot. You will need to return to your health care provider to have X-rays taken until your bones heal. Your health care provider will look at the X-rays to make sure that your foot is healing well. HOME CARE INSTRUCTIONS  If You Have a Cast:  Do not stick anything inside the cast to scratch your skin. Doing that increases your risk of infection.  Check the skin around the cast every day. Report any concerns to your health care provider. You may put lotion on dry skin around the edges of the cast. Do not apply lotion to the skin underneath the cast.  Keep the cast clean and dry. If You Have a Splint  or a Supportive Boot:  Wear it as directed by your health care provider. Remove it only as directed by your health care provider.  Loosen it if your toes become numb and tingle, or if they turn cold and blue.  Keep it clean and dry. Bathing  Do not take baths, swim, or use a hot tub until your health care provider approves. Ask your health care provider if you can take showers. You may only be allowed to take sponge baths for bathing.  If your health care provider approves bathing and showering, cover the cast or splint with a watertight plastic bag to protect it from water. Do not let the cast or splint get wet. Managing Pain, Stiffness, and Swelling  If directed, apply ice to the injured area (if you have a splint, not a cast).  Put ice in a plastic bag.  Place a towel between your skin and the bag.  Leave the ice on for 20 minutes, 2-3 times per day.  Move your toes often to avoid stiffness and to lessen swelling.  Raise (elevate) the injured area above the level of your heart while you are sitting or lying down. Driving  Do not drive or operate heavy machinery while taking pain medicine.  Do not drive while wearing foot support on a foot that you use for driving. Activity  Return to your normal activities as directed by your health care   provider. Ask your health care provider what activities are safe for you.  Perform exercises as directed by your health care provider or physical therapist. Safety  Do not use the injured foot to support your body weight until your health care provider says that you can. Use crutches as directed by your health care provider. General Instructions  Do not put pressure on any part of the cast or splint until it is fully hardened. This may take several hours.  Do not use any tobacco products, including cigarettes, chewing tobacco, or e-cigarettes. Tobacco can delay bone healing. If you need help quitting, ask your health care  provider.  Take medicines only as directed by your health care provider.  Keep all follow-up visits as directed by your health care provider. This is important. SEEK MEDICAL CARE IF:  You have a fever.  Your cast, splint, or boot is too loose or too tight.  Your cast, splint, or boot is damaged.  Your pain medicine is not helping.  You have pain, tingling, or numbness in your foot that is not going away. SEEK IMMEDIATE MEDICAL CARE IF:  You have severe pain.  You have tingling or numbness in your foot that is getting worse.  Your foot feels cold or becomes numb.  Your foot changes color.   This information is not intended to replace advice given to you by your health care provider. Make sure you discuss any questions you have with your health care provider.   Document Released: 05/15/2002 Document Revised: 01/07/2015 Document Reviewed: 06/19/2014 Elsevier Interactive Patient Education 2016 Elsevier Inc.  

## 2015-07-04 NOTE — Progress Notes (Signed)
   Subjective:    Patient ID: Kimberly Shelton, female    DOB: Oct 12, 1960, 54 y.o.   MRN: [id number]  HPI Patient is seen for the following issues  Recent left foot pain. X-rays revealed nondisplaced second metatarsal fracture. Her pain is already improved. She is using a walking shoe. She's been elevating and icing at night. Her work does require a fair amount of walking. She is tolerating fairly well  Patient brings in a form to complete for health screening. This does request cholesterol level which she has not had for quite some time  Past Medical History  Diagnosis Date  . Abnormal Pap smear 09/2006    HGSIL CIN 2/VAIN/ CIN 3/VAIN-3 CIS  . RA (rheumatoid arthritis) (HCC) 07/2009  . Pneumonia    Past Surgical History  Procedure Laterality Date  . Cervical biopsy  w/ loop electrode excision  2008    CIN 3  . Pilonidal cyst excision  1999    I&D    reports that she quit smoking about 16 months ago. Her smoking use included Cigarettes. She has a 30 pack-year smoking history. She has never used smokeless tobacco. She reports that she drinks alcohol. She reports that she does not use illicit drugs. family history includes Bronchitis in her father; COPD in her father; Cancer in her brother; Diabetes in her father and mother; Rheum arthritis in her mother. There is no history of CAD. Allergies  Allergen Reactions  . Bee Venom   . Codeine     "seeing spots"  . Penicillins     REACTION: rash, itiching      Review of Systems  Neurological: Negative for weakness and numbness.       Objective:   Physical Exam  Constitutional: She appears well-developed and well-nourished.  Cardiovascular: Normal rate and regular rhythm.   Pulmonary/Chest: Effort normal and breath sounds normal.  Musculoskeletal:  Left foot reveals still some very mild swelling dorsally. Fading ecchymosis. Very minimally tender to palpation distal second metatarsal. Overall improved from last week            Assessment & Plan:  #1 nondisplaced left second metatarsal fracture. Clinically doing well. Order for repeat x-ray to make sure this is not displaced. She will continue walking shoe. She is aware this will take about 6 weeks to heal. Avoid high heels and maintain walking with good fairly stiff sole shoes #2 patient requests cholesterol screening for wellness assessment for work. She will return for fasting lipid panel early next week. Order placed.

## 2015-07-04 NOTE — Progress Notes (Signed)
Pre visit review using our clinic review tool, if applicable. No additional management support is needed unless otherwise documented below in the visit note. 

## 2015-07-09 ENCOUNTER — Other Ambulatory Visit (INDEPENDENT_AMBULATORY_CARE_PROVIDER_SITE_OTHER): Payer: Managed Care, Other (non HMO)

## 2015-07-09 ENCOUNTER — Ambulatory Visit (INDEPENDENT_AMBULATORY_CARE_PROVIDER_SITE_OTHER)
Admission: RE | Admit: 2015-07-09 | Discharge: 2015-07-09 | Disposition: A | Discharge status: Home / Self Care | Payer: Managed Care, Other (non HMO) | Source: Ambulatory Visit | Attending: Family Medicine | Admitting: Family Medicine

## 2015-07-09 DIAGNOSIS — Z13220 Encounter for screening for lipoid disorders: Secondary | ICD-10-CM | POA: Diagnosis not present

## 2015-07-09 DIAGNOSIS — S92302D Fracture of unspecified metatarsal bone(s), left foot, subsequent encounter for fracture with routine healing: Secondary | ICD-10-CM | POA: Diagnosis not present

## 2015-07-09 LAB — LIPID PANEL
CHOL/HDL RATIO: 3
Cholesterol: 177 mg/dL (ref 0–200)
HDL: 64.5 mg/dL (ref >39.00)
LDL Cholesterol: 89 mg/dL (ref 0–99)
NonHDL: 112.96
TRIGLYCERIDES: 118 mg/dL (ref 0.0–149.0)
VLDL: 23.6 mg/dL (ref 0.0–40.0)

## 2015-07-16 ENCOUNTER — Ambulatory Visit: Payer: Managed Care, Other (non HMO) | Admitting: Nurse Practitioner

## 2015-07-21 ENCOUNTER — Encounter: Payer: Self-pay | Admitting: Obstetrics & Gynecology

## 2015-07-21 ENCOUNTER — Ambulatory Visit (INDEPENDENT_AMBULATORY_CARE_PROVIDER_SITE_OTHER): Payer: Managed Care, Other (non HMO) | Admitting: Obstetrics & Gynecology

## 2015-07-21 ENCOUNTER — Ambulatory Visit
Admission: RE | Admit: 2015-07-21 | Discharge: 2015-07-21 | Disposition: A | Discharge status: Home / Self Care | Payer: Managed Care, Other (non HMO) | Source: Ambulatory Visit

## 2015-07-21 VITALS — BP 112/60 | HR 90 | Resp 18 | Ht 63.5 in | Wt 142.0 lb

## 2015-07-21 DIAGNOSIS — Z01419 Encounter for gynecological examination (general) (routine) without abnormal findings: Secondary | ICD-10-CM

## 2015-07-21 DIAGNOSIS — Z1231 Encounter for screening mammogram for malignant neoplasm of breast: Secondary | ICD-10-CM

## 2015-07-21 DIAGNOSIS — Z124 Encounter for screening for malignant neoplasm of cervix: Secondary | ICD-10-CM

## 2015-07-21 DIAGNOSIS — Z0000 Encounter for general adult medical examination without abnormal findings: Secondary | ICD-10-CM

## 2015-07-21 LAB — POCT URINALYSIS DIPSTICK
Bilirubin, UA: NEGATIVE
Glucose, UA: NEGATIVE
Ketones, UA: NEGATIVE
Leukocytes, UA: NEGATIVE
Nitrite, UA: NEGATIVE
PH UA: 5
PROTEIN UA: NEGATIVE
RBC UA: NEGATIVE
UROBILINOGEN UA: NEGATIVE

## 2015-07-21 NOTE — Progress Notes (Signed)
53 y.o. G1P1 MarriedCaucasianF here for annual exam.  Pt reports she is doing well.  Seeing Kimberly. Marchelle Shelton every three months.  H/o pneumonia last year.  Also sees Kimberly. Clifton Shelton, cardiology, once a year.  H/O tachycardia with her pneumonia.    Denies vaginal bleeding.    Patient's last menstrual period was 09/06/2004.          Sexually active: Yes.    The current method of family planning is post menopausal status.    Exercising: Yes.    walking Smoker:  Former smoker-quit last year  Health Maintenance: Pap:  06/21/14 Neg. HR HPV:+Detected, 16/18-negative History of abnormal Pap:  yes MMG:  06/24/14 BIRADS1:neg, will do 3D today Colonoscopy:  5/16-repeat in 10 years Kimberly Shelton BMD:   06/21/14, -1.8/-1.4 TDaP:  2011 Screening Labs: PCP, Hb today: PCP, Urine today: negative   reports that she quit smoking about 17 months ago. Her smoking use included Cigarettes. She has a 30 pack-year smoking history. She has Shelton used smokeless tobacco. She reports that she drinks alcohol. She reports that she does not use illicit drugs.  Past Medical History  Diagnosis Date  . Abnormal Pap smear 09/2006    HGSIL CIN 2/VAIN/ CIN 3/VAIN-3 CIS  . RA (rheumatoid arthritis) (HCC) 07/2009  . Pneumonia     Past Surgical History  Procedure Laterality Date  . Cervical biopsy  w/ loop electrode excision  2008    CIN 3  . Pilonidal cyst excision  1999    I&D    Current Outpatient Prescriptions  Medication Sig Dispense Refill  . albuterol (PROVENTIL HFA;VENTOLIN HFA) 108 (90 BASE) MCG/ACT inhaler Inhale 2 puffs into the lungs every 6 (six) hours as needed for wheezing or shortness of breath. 1 Inhaler 6  . escitalopram (LEXAPRO) 10 MG tablet TAKE 1 TABLET BY MOUTH EVERY DAY 90 tablet 3  . leflunomide (ARAVA) 20 MG tablet Take 1 tablet by mouth daily.    Marland Kitchen ORENCIA 250 MG injection Inject 250 mg into the vein every 30 (thirty) days.     Marland Kitchen tiotropium (SPIRIVA) 18 MCG inhalation capsule Place 1 capsule (18 mcg  total) into inhaler and inhale daily. 30 capsule 6   No current facility-administered medications for this visit.    Family History  Problem Relation Age of Onset  . Diabetes Mother   . Rheum arthritis Mother   . Diabetes Father   . COPD Father   . Bronchitis Father   . Cancer Brother     Lung  . CAD Neg Hx     ROS:  Pertinent items are noted in HPI.  Otherwise, a comprehensive ROS was negative.  Exam:   General appearance: alert, cooperative and appears stated age Head: Normocephalic, without obvious abnormality, atraumatic Neck: no adenopathy, supple, symmetrical, trachea midline and thyroid normal to inspection and palpation Lungs: clear to auscultation bilaterally Breasts: normal appearance, no masses or tenderness Heart: regular rate and rhythm Abdomen: soft, non-tender; bowel sounds normal; no masses,  no organomegaly Extremities: extremities normal, atraumatic, no cyanosis or edema Skin: Skin color, texture, turgor normal. No rashes or lesions Lymph nodes: Cervical, supraclavicular, and axillary nodes normal. No abnormal inguinal nodes palpated Neurologic: Grossly normal   Pelvic: External genitalia:  no lesions              Urethra:  normal appearing urethra with no masses, tenderness or lesions              Bartholins and Skenes: normal  Vagina: normal appearing vagina with normal color and discharge, no lesions              Cervix: no lesions              Pap taken: Yes.   Bimanual Exam:  Uterus:  normal size, contour, position, consistency, mobility, non-tender              Adnexa: normal adnexa and no mass, fullness, tenderness               Rectovaginal: Confirms               Anus:  normal sphincter tone, no lesions  Chaperone was present for exam.  A:  Well Woman with normal exam PMP, no HRT H/O CIN 3 with LEEP 2008 RA H/o pneumonia  P: Mammogram yearly.  Pt is going to use a 3D MMG Pp with HR HPV.  H/o neg pap with +HR HPV  with neg 16/18 last year Labs with Kimberly. Caryl Shelton Sees Kimberly. Marchelle Shelton every three months Sees Kimberly. Clifton Shelton, cardiology, yearly Return annually or prn

## 2015-07-23 LAB — IPS PAP TEST WITH HPV

## 2015-07-28 ENCOUNTER — Telehealth: Payer: Self-pay | Admitting: Emergency Medicine

## 2015-07-28 ENCOUNTER — Telehealth: Payer: Self-pay | Admitting: Obstetrics & Gynecology

## 2015-07-28 DIAGNOSIS — R87610 Atypical squamous cells of undetermined significance on cytologic smear of cervix (ASC-US): Secondary | ICD-10-CM

## 2015-07-28 DIAGNOSIS — R87810 Cervical high risk human papillomavirus (HPV) DNA test positive: Principal | ICD-10-CM

## 2015-07-28 NOTE — Telephone Encounter (Signed)
Patient has questions about her colposcopy procedure.

## 2015-07-28 NOTE — Telephone Encounter (Signed)
-----   Message from Jerene Bears, MD sent at 07/25/2015  6:19 PM EST ----- Inform pt Pap is now ASCUS and HR HPV is still positive.  Needs colposcopy.

## 2015-07-28 NOTE — Telephone Encounter (Signed)
Spoke with patient. Patient states "When I had a procedure with Dr.Romine years ago she used this numbing medicine that caused me to get really hot and then get a really bad headache. I did not like that at all. I was scared. I felt like I did not have control. I do not want to have that again." In 2008 patient had a cervical biopsy with loop electrode excision due to CIN 3. Reviewed paper chart and 10 cc of 2% Xylocaine with 1: 100,000U Epinephrine was used for numbing. Patient states she does not want to have the same numbing medication she had before. Advised patient to speak with Dr.Miller regarding this at her appointment which is scheduled for 08/21/2015. "I would rather not have anything than to have that again." Aware she will need to take 800 mg of Ibuprofen/Motrin 1 hours prior to the appointment. Asking if the changes on her pap smear can be a result of going through menopause. Advised pap smear changes do not come from going through menopause. Patient is agreeable. Advised I will speak with Dr.Miller regarding her concerns and return call with any further recommendations. Patient is agreeable.  Dr.Miller, any additional recommenations for this patient prior to her colposcopy on 08/21/2015?

## 2015-07-28 NOTE — Telephone Encounter (Signed)
Patient notified of message from Dr. Hyacinth Meeker. Pap results and HPV results discussed.   She is agreeable to scheduling colposcopy. Scheduled for 08/21/15 at 1000 (Pt choice for date).  Brief description of procedure given to patient.  Colposcopy pre-procedure instructions given. Pt is post menopausal.   Make sure to eat a meal and hydrate before appointment.  Advised 800 mg of Motrin PO with food one hour prior to appointment.     Patient verbalized understanding of preprocedure instructions .   Patient is advised she will be contacted with insurance coverage information. cc Lilyan Gilford   Routing to provider for final review. Patient agreeable to disposition. Will close encounter.

## 2015-07-29 NOTE — Telephone Encounter (Signed)
No additional recommendations.  Thanks.  Ok to close encounter. 

## 2015-08-11 ENCOUNTER — Ambulatory Visit (INDEPENDENT_AMBULATORY_CARE_PROVIDER_SITE_OTHER): Payer: Managed Care, Other (non HMO) | Admitting: Internal Medicine

## 2015-08-11 ENCOUNTER — Encounter: Payer: Self-pay | Admitting: Internal Medicine

## 2015-08-11 VITALS — BP 110/64 | HR 74 | Ht 63.5 in | Wt 144.0 lb

## 2015-08-11 DIAGNOSIS — R0600 Dyspnea, unspecified: Secondary | ICD-10-CM

## 2015-08-11 DIAGNOSIS — J13 Pneumonia due to Streptococcus pneumoniae: Secondary | ICD-10-CM | POA: Diagnosis not present

## 2015-08-11 NOTE — Patient Instructions (Addendum)
ICD-9-CM ICD-10-CM   1. Pneumococcal lobar pneumonia (HCC) 481 J13   2. Dyspnea 786.09 R06.00    Glad you are better and managing without spiriva Occ squeaks in lung are due to chronic scar teissue Glad you are uptodate with vaccines  PLAN Ok to hold off spiriva as discussed  Ok to hold off CXR today as discussed Use albuterol as needed - if more than 2 times per week let us know   Followup CXR 2 view in 6 months Followup with me in 6 months Keep up fu with DR Dierdre Forth for [place] as before

## 2015-08-11 NOTE — Progress Notes (Signed)
Subjective:     Patient ID: Kimberly Shelton, female   DOB: 1961-04-16, 54 y.o.   MRN: [id number]  HPI      OV 03/04/2014 Chief Complaint  Patient presents with  . Follow-up    HFU-D/c from [facility]. Pt states she has chest soreness from coughing. Pt states with the O2 her breathing has improved. C/o dyspnea when working with PT and cough with yellow mucous.    Admitted 02/16/2014 through 02/24/2014 with right-sided pneumococcal pneumonia with cavitation in the setting of smoking and rheumatoid arthritis on immune modulators. At this point in time she is off the immunomodulators. Dr. Dierdre Forth her rheumatologist is aware of this. She is 25% back to her baseline but still very fatigued and short of breath. She coughs a lot with home physical therapy. She brings up green mucus and sputum with her cough needed overall cough is significantly improved since discharge. Appetite is slowly returning. 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 She is requesting to stay off work for several weeks. On 2 L there is documentation that when she walks 2 minutes up saturation still stays at 95%.   Today in the office pon room air at rest t it is 93%. Walked 40 feet and desaturated to 89% but HR 150/min per tech: I think this is all deconditioning but not sure if she has cardiomyopathy from her illness   04/02/14 Follow up  Pt returns for follow up from recent sever critical illness from streptococcal PNA , Bacteremia, sepsis 02/2014.   She had a right-sided pneumococcal pneumonia with cavitation in the setting of smoking and rheumatoid arthritis on immune modulators Today PFT show FEV1 61% , ratio 87  , FVC 55 %  , NO sign BD response, Diffusing capacity  Decreased 57% Has not smoked.  Wants to return to work soon.  CXR shows persistent RUL/LLL cavitary lesions .  Says she is feeling so much better.  No fever, chest pain, orthopnea, edema or n/v/d.   Good appetite . Seen by cards for tachycardia felt secondary to recent PN   OV 05/01/2014  Chief Complaint  Patient presents with  . Follow-up    Pt states her breathing has improved since last OV. Pt c/o prod cough with yellow and clear mucous and lower mid lumbar pain, pt thinks d/t RA. Pt doing duoneb only once a day.  Pt denies SOB.     Admitted 02/16/2014 through 02/24/2014 with right-sided pneumococcal pneumonia with cavitation in the setting of smoking and rheumatoid arthritis on immune modulators. Current followup is for the same   S: Presents with husband. Feels great overall. No fatigute. No dyspnea. Able to do all ADLs. Wants to return to work now and is asking for release to work. In terms of RA, she feels early morning stiffness of hand joint is returning. SHe is seeing Dr Dierdre Forth 05/01/14 and will discuss with him about return to immunomodulators. There are no other issues. CXR done after she left and at time of this note:    Dg Chest 2 View  04/30/2014   CLINICAL DATA:  Followup cavitary pneumonia  EXAM: CHEST  2 VIEW  COMPARISON:  04/02/2014  FINDINGS: Normal heart size, mediastinal contours, and pulmonary vascularity.  Patchy infiltrates are identified in RIGHT upper lobe and LEFT lower lobe consistent with pneumonia.  Cavitary foci identified previously in the in both the RIGHT upper  and LEFT lower lobes are still seen, the less evident in the RIGHT upper lobe.  Scattered interstitial prominence again identified.  No gross pleural effusion or pneumothorax.  Bones unremarkable.  IMPRESSION: Persistent infiltrates are identified in the RIGHT upper lobe and LEFT lower lobe with persistent visualization of cavitary foci.   Electronically Signed   By: Ulyses Southward M.D.   On: 04/30/2014 19:09      OV 08/09/2014  Chief Complaint  Patient presents with  . Follow-up    Pt here after CT scan. Pt denies change in breathing since last OV. Pt c/o prod cough with clear and green mucus.      Presents for Pneumococcal pneumonia followup that developed in setting of RA and Simponi  She is being maintained on ARAVA since her pneumococcal pneumonia earlier in 2013.  Now she is free of symptoms of respiratory tree. CT 08/08/14 shows further improvement but has architectural distortion. HEr RA is now significantly  active - per her and my talking 08/09/2014 with Dr Dierdre Forth. Patient is very apprehensive of starting ORENCIA recommended by Dr Dierdre Forth due to increased aecopd risk (She does not have copd), recurrent infection esp in setting of focal archiectrual distortion and recent pneumonia. She is very scared of dealing with RA disability v infection risk   Social and past and family: Mom had RA. She is upset abotu her disease  CT 08/08/14  IMPRESSION: 1. Emphysema with scattered scarring, volume loss, scattered bulla (some of which are still inflamed and with a somewhat cavitary appearance), and bilateral airway thickening. Much of this represents residua from prior multi lobar cavitary pneumonia, and is improved compared to the prior July CT scan. Given the degree of architectural distortion, scattered nodularity and opacities, and and irregular scarring, CT is not able to clear the lung of the possibility of malignancy, and surveillance imaging may be warranted.   Electronically Signed  By: [name]  On: 08/08/2014 15:32  OV 11/06/2014  Chief Complaint  Patient presents with  . Follow-up    Coughing up whitish colored phlegm, SOB with walking, not always, sometimes.      Presents for Pneumococcal pneumonia followup that developed in setting of RA and Simponi June 2015, with resultant architectural distortion in her lung anatomy  Last seen December 2015. Since then overall she's doing well. At baseline she only has mild cough with occasional white sputum. She also has exertional dyspnea when she climbs up a hill but clearly in cold air he did this is  relieved by rest. She is currently on 2 immunomodulators for her rheumatoid arthritis Orencia and Arava and this is helping her joints. She's not had any respiratory complications so far. I notice that she is not on Bactrim prophylaxis but she is concerned about the side effects. And she wants to talk to her rheumatologist Dr. Dierdre Forth about this  Past, Family, Social reviewed: no change since last visit   OV 02/10/2015  Chief Complaint  Patient presents with  . Follow-up    Pt stated her breathing feels it has improved since last OV. Pt here after PFT. Pt stated she has a little more trouble breathing when it is hot out. Pt c/o mild prod cough with clear and small light green mucus. Pt denies CP/tightness.    Follow-up chronic destructive pulmonary to changes following pneumococcal pneumonia in 2015 in the setting of previous heavy smoking and immunomodulatory treatment for rheumatoid arthritis   Since seeing me last in March 2016  she feels that the albuterol inhaler that I tried for dyspnea and exertion and wheezing has helped. Dyspnea still persists. It is unchanged. It is mild. However it is improved with albuterol which she takes prior to exertion. There is no associated cough or. Wheezing is improved. No hemoptysis no fever no chills .Outside chart from Dr. Dierdre Forth her rheumatologist dated 02/04/2015 reviewed. She reported to him that she is compliant on Ornecia and arava with good relief and joint pain. Her dyspnea is mild chronic and stable. His advice for her to continue the same. He suggested that I look into her chronic residual dyspnea at this visit   Last pulmonary imaging with CT scan of the chest December 2015:residual changes + along with emphysema; report reviewed. Image not visualized  Pulmonary function test 02/10/2015 today personally reviewed image: FEV1 prebronchodilator 1.7 L/64%, ratio 69.  FEV1 postbronchodilator is 1.9 L/72% and is a 12% positive response with  broncho-dilator., FVC 2.4 L/72% and ratio of 79. Total lung capacity is 3.76 L/76%. DLCO is 18.5/80%. PFTs are consistent with restriction and a normal DLCO but in the prebronchodilator she has mild obstruction  OV 08/11/2015  Chief Complaint  Patient presents with  . Follow-up    Pt states her breathing has improved since last OV. Pt states she has only been using the spiriva as needed. Pt c/o prod cough with clear mucus and DOE with heavy exertion. pt denies CP/tightness.     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 She continues to do well. She is now on leflunomide and Orencia for her rheumatoid arthritis. With this rheumatoid arthritis symptoms have improved. She is follows up with Dr. Dierdre Forth in his new rheumatology practice [facility]. She feels that her wheezing has significantly improved. Shortness of breath significantly improved. She is not taking Spiriva anymore. She reported some hoarseness with it. She wants to continue to watch herself without Spiriva. She is up-to-date with her flu shot.   Current outpatient prescriptions:  .  albuterol (PROVENTIL HFA;VENTOLIN HFA) 108 (90 BASE) MCG/ACT inhaler, Inhale 2 puffs into the lungs every 6 (six) hours as needed for wheezing or shortness of breath., Disp: 1 Inhaler, Rfl: 6 .  escitalopram (LEXAPRO) 10 MG tablet, TAKE 1 TABLET BY MOUTH EVERY DAY, Disp: 90 tablet, Rfl: 3 .  leflunomide (ARAVA) 20 MG tablet, Take 1 tablet by mouth daily., Disp: , Rfl:  .  ORENCIA 250 MG injection, Inject 250 mg into the vein once a week. , Disp: , Rfl:  .  tiotropium (SPIRIVA) 18 MCG inhalation capsule, Place 1 capsule (18 mcg total) into inhaler and inhale daily., Disp: 30 capsule, Rfl: 6   Allergies  Allergen Reactions  . Bee Venom   . Codeine      "seeing spots"  . Penicillins     REACTION: rash, itiching    Immunization History  Administered Date(s) Administered  . Influenza,inj,Quad PF,36+ Mos 06/25/2015  . Influenza-Unspecified 06/06/2014  . Pneumococcal Polysaccharide-23 02/18/2014     Review of Systems    according to history of present illness Objective:   Physical Exam  Constitutional: She is oriented to person, place, and time. She appears well-developed and well-nourished. No distress.  HENT:  Head: Normocephalic and atraumatic.  Right Ear: External ear normal.  Left Ear: External ear normal.  Mouth/Throat: Oropharynx is clear and moist. No oropharyngeal  exudate.  Eyes: Conjunctivae and EOM are normal. Pupils are equal, round, and reactive to light. Right eye exhibits no discharge. Left eye exhibits no discharge. No scleral icterus.  Neck: Normal range of motion. Neck supple. No JVD present. No tracheal deviation present. No thyromegaly present.  Cardiovascular: Normal rate, regular rhythm, normal heart sounds and intact distal pulses.  Exam reveals no gallop and no friction rub.   No murmur heard. Pulmonary/Chest: Effort normal and breath sounds normal. No respiratory distress. She has no wheezes. She has no rales. She exhibits no tenderness.  Occasional scattered squeaky wheezes  Abdominal: Soft. Bowel sounds are normal. She exhibits no distension and no mass. There is no tenderness. There is no rebound and no guarding.  Musculoskeletal: Normal range of motion. She exhibits no edema or tenderness.  Lymphadenopathy:    She has no cervical adenopathy.  Neurological: She is alert and oriented to person, place, and time. She has normal reflexes. No cranial nerve deficit. She exhibits normal muscle tone. Coordination normal.  Skin: Skin is warm and dry. No rash noted. She is not diaphoretic. No erythema. No pallor.  Psychiatric: She has a normal mood and affect. Her behavior is normal. Judgment and thought content  normal.  Vitals reviewed.   Filed Vitals:   08/11/15 1418  BP: 110/64  Pulse: 74  Height: 5' 3.5" (1.613 m)  Weight: 144 lb (65.318 kg)  SpO2: 99%        Assessment:       ICD-9-CM ICD-10-CM   1. Pneumococcal lobar pneumonia (HCC) 481 J13   2. Dyspnea 786.09 R06.00        Plan:      Glad you are better and managing without spiriva Occ squeaks in lung are due to chronic scar teissue Glad you are uptodate with vaccines  PLAN Ok to hold off spiriva as discussed  Ok to hold off CXR today as discussed Use albuterol as needed - if more than 2 times per week let us know   Followup CXR 2 view in 6 months Followup with me in 6 months Keep up fu with DR Dierdre Forth for Orencia as before    Dr. Kalman Shan, M.D., [facility].C.P Pulmonary and Critical Care Medicine Staff Physician [facility] Pager: [phone], If no answer or between  15:00h - 7:00h: call [phone]  08/18/2015 12:08 AM

## 2015-08-19 ENCOUNTER — Other Ambulatory Visit: Payer: Self-pay | Admitting: Nurse Practitioner

## 2015-08-19 NOTE — Telephone Encounter (Signed)
Medication refill request: Lexapro Last AEX:  07-21-15 Next AEX: 11-19-16 Last MMG (if hormonal medication request): 07-22-15 WNL Refill authorized: please advise

## 2015-08-21 ENCOUNTER — Ambulatory Visit (INDEPENDENT_AMBULATORY_CARE_PROVIDER_SITE_OTHER): Payer: Managed Care, Other (non HMO) | Admitting: Obstetrics & Gynecology

## 2015-08-21 ENCOUNTER — Encounter: Payer: Self-pay | Admitting: Obstetrics & Gynecology

## 2015-08-21 DIAGNOSIS — R87610 Atypical squamous cells of undetermined significance on cytologic smear of cervix (ASC-US): Secondary | ICD-10-CM | POA: Diagnosis not present

## 2015-08-21 DIAGNOSIS — R87810 Cervical high risk human papillomavirus (HPV) DNA test positive: Secondary | ICD-10-CM

## 2015-08-21 MED ORDER — LORAZEPAM 0.5 MG PO TABS
1.0000 mg | ORAL_TABLET | Freq: Once | ORAL | Status: AC
  Administered 2015-08-21: 1 mg via ORAL

## 2015-08-21 NOTE — Progress Notes (Signed)
Subjective:     Patient ID: Kimberly Shelton, female   DOB: Jan 24, 1961, 54 y.o.   MRN: [id number]  HPI 54 yo G1P1 MWF here for colposcopy after having ASCUS pap with +HR HPV obtained 2016 at [facility].  Pt had hx of normal pap with +HR HPV 1 year ago.  16/18 testing was negative.  Pt has hx of abnormal pap smears in the past with LEEP 2008.    Review of Systems  Psychiatric/Behavioral: The patient is nervous/anxious (today due to procedure).        Objective:   Physical Exam  Genitourinary: Vagina normal. There is no rash, tenderness or lesion on the right labia. There is no rash, tenderness or lesion on the left labia. Cervix exhibits no motion tenderness and no friability.    Lymphadenopathy:       Right: No inguinal adenopathy present.       Left: No inguinal adenopathy present.   Speculum placed.  3% acetic acid applied to cervix for >45 seconds.  Cervix visualized with both 7.5X and 15X magnification.  Green filter also used.  Lugols solution was used.  Findings:  No AWE noted.  Decreased lugol's staining as in picture above.  Biopsy:  6 and 12 o'clock.  ECC:  was performed.  Monsel's was needed.  Excellent hemostasis was present.  Pt tolerated procedure well and all instruments were removed.      Assessment:     ASCUS pap with +HR HPV H/O neg pap with + HR HPV, neg 16/18 2015 H/O LEEP 2008     Plan:     Biopsies and ECC pending.  Probably, follow up 1 year with pap and HR HPV testing again will be needed based on findings today on colposcopy.

## 2015-08-21 NOTE — Addendum Note (Signed)
Addended by: Dion Body on: 08/21/2015 01:11 PM   Modules accepted: Orders

## 2015-08-21 NOTE — Patient Instructions (Signed)

## 2015-08-25 LAB — IPS OTHER TISSUE BIOPSY

## 2015-09-12 ENCOUNTER — Ambulatory Visit: Payer: Managed Care, Other (non HMO) | Admitting: Obstetrics & Gynecology

## 2015-09-19 ENCOUNTER — Ambulatory Visit (INDEPENDENT_AMBULATORY_CARE_PROVIDER_SITE_OTHER): Payer: Managed Care, Other (non HMO) | Admitting: Obstetrics & Gynecology

## 2015-09-19 DIAGNOSIS — D069 Carcinoma in situ of cervix, unspecified: Secondary | ICD-10-CM

## 2015-09-19 MED ORDER — TIOTROPIUM BROMIDE MONOHYDRATE 18 MCG IN CAPS: 18.0000 ug | ORAL_CAPSULE | RESPIRATORY_TRACT | Status: DC | PRN

## 2015-09-21 ENCOUNTER — Encounter: Payer: Self-pay | Admitting: Obstetrics & Gynecology

## 2015-09-21 DIAGNOSIS — D069 Carcinoma in situ of cervix, unspecified: Secondary | ICD-10-CM | POA: Insufficient documentation

## 2015-09-21 NOTE — Progress Notes (Signed)
GYNECOLOGY  VISIT   HPI: 55 y.o. G1P1 Married Caucasian female here for discussion of her cervical biopsies obtained with colposcopy on 08/21/15.  Pt with hx of abnormal pap smears in the past and a LEEP in 2008.  Pt had +HR HPV testing in 2015.  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  (Of note, 16/18 testing was negative in 2015).  Colposcopy was very difficult due to pt's anxiety and movement during the procedure.  Reviewed with pt her pap smear results, pathology results and recommendation to proceed again with LEEP procedure.  However, due to her anxiety and movement during the colposcopy, I would highly recommend doing this in the OR under at least some heavy sedation, if not LMA.  Pt is very relieved by this as she was anxious about having this done in the office.  The prior LEEP was traumatic for her and she really did not want to do it again in the office.  Procedure, risks including infection, bleeding, and ~10% failure rate reviewed.  Pt understands that dysplasia could return if the HPV continues to be positive even if she does have negative margins with the pathology.  Pelvic pain and scarring to the cervix is also a possibility.  Pt voices clear understanding and desire to proceed with scheduling.    GYNECOLOGIC HISTORY: Patient's last menstrual period was 09/06/2004. Contraception: PMP status Menopausal hormone therapy: none  Patient Active Problem List   Diagnosis Date Noted  . Rheumatoid arthritis involving multiple joints (HCC) 08/09/2014  . Chronic diastolic heart failure (HCC) 02/19/2014  . Sepsis (HCC) 02/17/2014  . DEPRESSION 03/26/2009  . CARPAL TUNNEL SYNDROME 03/26/2009  . ARTHRALGIA 03/26/2009    Past Medical History  Diagnosis Date  . Abnormal Pap smear 09/2006    HGSIL CIN 2/VAIN/ CIN 3/VAIN-3 CIS  . RA (rheumatoid arthritis) (HCC) 07/2009  . Pneumonia     was hospitalized  . Foot fracture, left 10/16    hair-line  fracture    Past Surgical History  Procedure Laterality Date  . Cervical biopsy  w/ loop electrode excision  2008    CIN 3  . Pilonidal cyst excision  1999    I&D    MEDS:  Reviewed in EPIC and UTD  ALLERGIES: Bee venom; Codeine; and Penicillins  Family History  Problem Relation Age of Onset  . Diabetes Mother   . Rheum arthritis Mother   . Diabetes Father   . COPD Father   . Bronchitis Father   . Cancer Brother     Lung  . CAD Neg Hx     SH:  Married, non smoker  Review of Systems  All other systems reviewed and are negative.   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   ~15 minutes spent with patient >50% of time was in face to face discussion of above.

## 2015-09-22 ENCOUNTER — Ambulatory Visit: Payer: Managed Care, Other (non HMO) | Admitting: Obstetrics & Gynecology

## 2015-09-30 ENCOUNTER — Telehealth: Payer: Self-pay | Admitting: Obstetrics & Gynecology

## 2015-09-30 NOTE — Telephone Encounter (Signed)
Spoke with pt regarding benefit for surgery. Patient had additional questions and concerns. Forward to [place] to review

## 2015-09-30 NOTE — Telephone Encounter (Signed)
Called patient to discuss benefits for a procedure. Left Voicemail requesting a cal back.

## 2015-10-03 ENCOUNTER — Telehealth: Payer: Self-pay | Admitting: Family Medicine

## 2015-10-03 NOTE — Telephone Encounter (Signed)
The form is in md folder

## 2015-10-03 NOTE — Telephone Encounter (Addendum)
Pt would like to know if [employer] wellness form has been completed and faxes. I told pt to fax another form to my attention

## 2015-10-06 NOTE — Telephone Encounter (Signed)
Pt has not had a recent panel of labs or recent CPE. Dr. Caryl Never cannot fill out form. Ask her if she would like [place] to fax it to her OBGYN since she has been seen by her several times?

## 2015-10-06 NOTE — Telephone Encounter (Addendum)
Pt has not been seen for a recent CPE. Looks

## 2015-10-13 NOTE — Telephone Encounter (Signed)
Patient states she is returning a call to [place]. She can be reached at [phone]

## 2015-10-13 NOTE — Telephone Encounter (Signed)
Patient spoke to Research officer, political party. See account notes. Ready to proceed with surgery as scheduled.  Routing to provider for final review. Patient agreeable to disposition. Will close encounter.

## 2015-10-21 NOTE — Telephone Encounter (Signed)
Patient is addiment that she saw and talked to Dr. Caryl Never about this form and she had labs for that form to be filled out at the end of last year at a office visit.  She said the doctor expressed that he would fill out the form and fax it.

## 2015-10-21 NOTE — Telephone Encounter (Signed)
Pt is aware via voicemail that she needs a CPE with labs before we can complete her paperwork.

## 2015-10-21 NOTE — Telephone Encounter (Signed)
Completed and  Faxed

## 2015-10-27 ENCOUNTER — Ambulatory Visit: Payer: Managed Care, Other (non HMO) | Admitting: Obstetrics & Gynecology

## 2015-10-27 ENCOUNTER — Telehealth: Payer: Self-pay | Admitting: *Deleted

## 2015-10-27 NOTE — Telephone Encounter (Signed)
Patient left message that she desired to reschedule surgery. Call to patient, left message to call back.

## 2015-10-27 NOTE — Telephone Encounter (Signed)
Patient returned call. She would like to reschedule surgery to 12-15-15. She requests earliest morning start time possible. Advised will review with Dr Hyacinth Meeker and call her back with new date. Surgical consult scheduled for today cancelled and rescheduled based on possible 12-15-15 surgery date. Dr Hyacinth Meeker, please advise on 12-15-15 surgery date.

## 2015-10-29 ENCOUNTER — Telehealth: Payer: Self-pay | Admitting: Family Medicine

## 2015-10-29 NOTE — Telephone Encounter (Signed)
Opened in error

## 2015-10-29 NOTE — Telephone Encounter (Signed)
Pt left wellness form from [employer] with Dr Caryl Never at her visit in this past October.  Pt had her labs done in November

## 2015-10-29 NOTE — Telephone Encounter (Addendum)
Patient calling to state that she now needs to reschedule surgery to 4-17 or 4-24 because someone else is off work on 12-15-15. Advised patient of scheduling policy and that although exception has been made for her first reschedule, she needs to select date and be committed to date as there is cancellation/rescheduling charge. Patient states "that is ridiculous" things come up.  Patient very upset when advised of office policy and states I don't have to change the surgery date, she "will discuss this with Dr Hyacinth Meeker personally when she comes in for appointment."   Patient disconnected call.

## 2015-10-31 NOTE — Telephone Encounter (Signed)
Encounter closed

## 2015-10-31 NOTE — Telephone Encounter (Signed)
Called pt this morning.  Spoke with her with Kimberly Shelton in my office.  Pt aware that I feel she is delaying too long for this procedure.  D/W pt importance of porcedure for both diagnostic and therapeutic purposes.  D/W pt risks of invasive cervical cancer and with continued delay, there are increased risks for this results.  Pt asks about having to come in again since nothing has changed.  Reviewed with her hospital policy regarding being seen within 30 days of procedure.  As she keeps changing this surgery, this appt is needed and will continue to be needed.  Advised pt my office will not reschedule this surgery again.  Offered her a second opinion or transfer of care--which she states she does NOT want.  Voiced understanding of importance of procedure.  Phone call ended with pt being able to ask any questions or voice any frustrations.  Pt states she very much appreciated the phone call to her.  OK to close encounter.

## 2015-11-25 ENCOUNTER — Encounter: Payer: Self-pay | Admitting: Obstetrics & Gynecology

## 2015-11-25 ENCOUNTER — Ambulatory Visit (INDEPENDENT_AMBULATORY_CARE_PROVIDER_SITE_OTHER): Payer: Managed Care, Other (non HMO) | Admitting: Obstetrics & Gynecology

## 2015-11-25 VITALS — BP 138/80 | HR 90 | Resp 18 | Ht 63.5 in | Wt 143.0 lb

## 2015-11-25 DIAGNOSIS — F411 Generalized anxiety disorder: Secondary | ICD-10-CM | POA: Diagnosis not present

## 2015-11-25 DIAGNOSIS — D069 Carcinoma in situ of cervix, unspecified: Secondary | ICD-10-CM | POA: Diagnosis not present

## 2015-11-25 DIAGNOSIS — B977 Papillomavirus as the cause of diseases classified elsewhere: Secondary | ICD-10-CM | POA: Diagnosis not present

## 2015-11-25 DIAGNOSIS — Z658 Other specified problems related to psychosocial circumstances: Secondary | ICD-10-CM | POA: Diagnosis not present

## 2015-11-25 MED ORDER — IBUPROFEN 800 MG PO TABS: 800.0000 mg | ORAL_TABLET | Freq: Three times a day (TID) | ORAL | Status: DC | PRN

## 2015-11-25 NOTE — Progress Notes (Signed)
55 y.o. G1P1 MarriedCaucasian female due to here to discuss upcoming procedure.  Due to anxiety and CIN 3, LEEP has been recommended.  Pt had ASCUS with +HR HPV pap obtained 11/16 and then colposcopy with biopsy was performed 08/21/15 showing CIN3 at 6 o'clock on the cervix as well as CIN 1 with HPV effect.  Recommendation to proceed with LEEP was made then and due to pt's difficulty in relaxing, recommended this be done in the OR.  Pt was in agreement but has called several times to change and or delay surgery.  The last time she called, I advised pt if she didn't proceed with this procedure (which, of course, is her decision), then I could no longer take care of her as I felt this was very important to her long term health.  2% risk of SCC being present was and is, again, reviewed with pt.  Procedure reviewed.  Risks and benefits reviewed including bleeding, infection, risks of not removing all of abnormal area and need for future procedure, risks of cancer being present and needing additional surgery, and scarring to cervix.   Pt reports in the last month, her husband moved out.  This happened three weeks ago.  Reports they have been struggling for several months.  He has rented 13 acres of land and moved their horses and dog.  He bought a camper to live in on the land.  They did not discuss this before he bought it so she is very angry and upset about it.  She is very tearful today and states if I hadn't called her, she would have canceled this surgery again but does understand the importance.  Ob Hx:   Patient's last menstrual period was 09/06/2004.          Sexually active: Yes.   Birth control: Post Menopausal Last pap: 07/21/15 ASCUS. HR HPV:+Detected. Colpo: CIN3, CIN1 Last MMG: 07/22/15 BIRADS1:neg Tobacco: Former  Past Surgical History  Procedure Laterality Date  . Cervical biopsy  w/ loop electrode excision  2008    CIN 3  . Pilonidal cyst excision  1999    I&D    Past Medical  History  Diagnosis Date  . Abnormal Pap smear 09/2006    HGSIL CIN 2/VAIN/ CIN 3/VAIN-3 CIS  . RA (rheumatoid arthritis) (HCC) 07/2009  . Pneumonia     was hospitalized  . Foot fracture, left 10/16    hair-line fracture    Allergies: Bee venom; Codeine; and Penicillins  Current Outpatient Prescriptions  Medication Sig Dispense Refill  . albuterol (PROVENTIL HFA;VENTOLIN HFA) 108 (90 BASE) MCG/ACT inhaler Inhale 2 puffs into the lungs every 6 (six) hours as needed for wheezing or shortness of breath. 1 Inhaler 6  . escitalopram (LEXAPRO) 10 MG tablet TAKE 1 TABLET BY MOUTH EVERY DAY 90 tablet 1  . ORENCIA 250 MG injection Inject 250 mg into the vein once a week.     . tiotropium (SPIRIVA) 18 MCG inhalation capsule Place 1 capsule (18 mcg total) into inhaler and inhale as needed. 30 capsule 6   No current facility-administered medications for this visit.    ROS: A comprehensive review of systems was negative.  Exam:    LMP 09/06/2004  General appearance: alert and cooperative Head: Normocephalic, without obvious abnormality, atraumatic Neck: no adenopathy, supple, symmetrical, trachea midline and thyroid not enlarged, symmetric, no tenderness/mass/nodules Lungs: clear to auscultation bilaterally Heart: regular rate and rhythm, S1, S2 normal, no murmur, click, rub or gallop Abdomen: soft, non-tender;  bowel sounds normal; no masses,  no organomegaly Extremities: extremities normal, atraumatic, no cyanosis or edema Skin: Skin color, texture, turgor normal. No rashes or lesions Lymph nodes: Cervical, supraclavicular, and axillary nodes normal. no inguinal nodes palpated Neurologic: Grossly normal  Pelvic:  No pelvic exam  A: ASCUS pap with +HR HPV  CIN3 on biopsy Anxiety Asthma  P:  Colposcopy with LEEP planned Rx for Motrin and Vicodin will be given post op. Medications/Vitamins reviewed.  Pt knows needs to not take any ASA products five days before procedure.  ~40  minutes spent with patient >50% of time was in face to face discussion of personal stressors, anxiety about this procedure, and the fear she has of something being bad.  Support provided during discussion.

## 2015-12-02 ENCOUNTER — Telehealth: Payer: Self-pay | Admitting: Obstetrics & Gynecology

## 2015-12-02 NOTE — Telephone Encounter (Signed)
Patient returning a call to Sue Lush but is "not sure what it is about."

## 2015-12-02 NOTE — Telephone Encounter (Signed)
Patient is returning a call to Andrea °

## 2015-12-03 NOTE — Patient Instructions (Signed)
Your procedure is scheduled on:  Monday, December 15, 2015  Enter through the Main Entrance of [facility] at: 6:00 AM  Pick up the phone at the desk and dial [phone].  Call this number if you have problems the morning of surgery: [phone].  Remember: Do NOT eat food or drink after:  Midnight Sunday December 14, 2015  Take these medicines the morning of surgery with a SIP OF WATER: Lexapro, Leflunomide  Bring asthma inhaler day of surgery.  Do NOT wear jewelry (body piercing), metal hair clips/bobby pins, make-up, or nail polish. Do NOT wear lotions, powders, or perfumes.  You may wear deodorant. Do NOT shave for 48 hours prior to surgery. Do NOT bring valuables to the hospital. Contacts, dentures, or bridgework may not be worn into surgery.  Have a responsible adult drive you home and stay with you for 24 hours after your procedure

## 2015-12-03 NOTE — Telephone Encounter (Signed)
Patient returning your call.

## 2015-12-03 NOTE — Telephone Encounter (Signed)
Returned call to patient. Per nurse, advised patient to arrive at hospital at 6:00am on day of surgery(Surgery at 7:30am).    Cc Billie Ruddy, RN   Routing to provider for final review. Patient agreeable to disposition. Will close encounter.

## 2015-12-04 ENCOUNTER — Encounter (HOSPITAL_COMMUNITY): Payer: Self-pay

## 2015-12-04 ENCOUNTER — Encounter (HOSPITAL_COMMUNITY)
Admission: RE | Admit: 2015-12-04 | Discharge: 2015-12-04 | Disposition: A | Discharge status: Home / Self Care | Payer: Managed Care, Other (non HMO) | Source: Ambulatory Visit | Attending: Obstetrics & Gynecology | Admitting: Obstetrics & Gynecology

## 2015-12-04 DIAGNOSIS — Z01812 Encounter for preprocedural laboratory examination: Secondary | ICD-10-CM | POA: Diagnosis not present

## 2015-12-04 HISTORY — DX: Heart failure, unspecified: I50.9

## 2015-12-04 LAB — BASIC METABOLIC PANEL
Anion gap: 9 (ref 5–15)
BUN: 10 mg/dL (ref 6–20)
CHLORIDE: 103 mmol/L (ref 101–111)
CO2: 27 mmol/L (ref 22–32)
CREATININE: 0.7 mg/dL (ref 0.44–1.00)
Calcium: 8.2 mg/dL — ABNORMAL LOW (ref 8.9–10.3)
GFR calc Af Amer: >60 mL/min (ref >60)
GFR calc non Af Amer: >60 mL/min (ref >60)
Glucose, Bld: 99 mg/dL (ref 65–99)
Potassium: 4.4 mmol/L (ref 3.5–5.1)
SODIUM: 139 mmol/L (ref 135–145)

## 2015-12-04 LAB — CBC
HCT: 36.9 % (ref 36.0–46.0)
HEMOGLOBIN: 12.1 g/dL (ref 12.0–15.0)
MCH: 28.7 pg (ref 26.0–34.0)
MCHC: 32.8 g/dL (ref 30.0–36.0)
MCV: 87.6 fL (ref 78.0–100.0)
Platelets: 278 10*3/uL (ref 150–400)
RBC: 4.21 MIL/uL (ref 3.87–5.11)
RDW: 13.9 % (ref 11.5–15.5)
WBC: 5.8 10*3/uL (ref 4.0–10.5)

## 2015-12-08 ENCOUNTER — Ambulatory Visit: Payer: Managed Care, Other (non HMO) | Admitting: Obstetrics & Gynecology

## 2015-12-14 ENCOUNTER — Encounter (HOSPITAL_COMMUNITY): Payer: Self-pay | Admitting: Anesthesiology

## 2015-12-14 NOTE — Anesthesia Preprocedure Evaluation (Addendum)
Anesthesia Evaluation  Patient identified by MRN, date of birth, ID band Patient awake    Reviewed: Allergy & Precautions, NPO status , Patient's Chart, lab work & pertinent test results  Airway Mallampati: I       Dental no notable dental hx.    Pulmonary former smoker,    Pulmonary exam normal        Cardiovascular +CHF  Normal cardiovascular exam  2D echo from 02/2014 shows grade 1 diastolic dysfunction with EF of 65-70%   Neuro/Psych PSYCHIATRIC DISORDERS Depression    GI/Hepatic negative GI ROS, Neg liver ROS,   Endo/Other  negative endocrine ROS  Renal/GU negative Renal ROS  negative genitourinary   Musculoskeletal RA   Abdominal Normal abdominal exam  (+)   Peds negative pediatric ROS (+)  Hematology negative hematology ROS (+)   Anesthesia Other Findings   Reproductive/Obstetrics negative OB ROS                            Anesthesia Physical Anesthesia Plan  ASA: II  Anesthesia Plan: General   Post-op Pain Management:    Induction: Intravenous  Airway Management Planned: LMA  Additional Equipment:   Intra-op Plan:   Post-operative Plan:   Informed Consent: I have reviewed the patients History and Physical, chart, labs and discussed the procedure including the risks, benefits and alternatives for the proposed anesthesia with the patient or authorized representative who has indicated his/her understanding and acceptance.     Plan Discussed with: CRNA and Surgeon  Anesthesia Plan Comments:        Anesthesia Quick Evaluation

## 2015-12-15 ENCOUNTER — Telehealth: Payer: Self-pay | Admitting: Obstetrics & Gynecology

## 2015-12-15 ENCOUNTER — Encounter: Payer: Self-pay | Admitting: Obstetrics & Gynecology

## 2015-12-15 ENCOUNTER — Ambulatory Visit (HOSPITAL_COMMUNITY): Payer: Managed Care, Other (non HMO) | Admitting: Anesthesiology

## 2015-12-15 ENCOUNTER — Encounter (HOSPITAL_COMMUNITY): Payer: Self-pay

## 2015-12-15 ENCOUNTER — Encounter (HOSPITAL_COMMUNITY)
Admission: RE | Disposition: A | Discharge status: Home / Self Care | Payer: Self-pay | Source: Ambulatory Visit | Attending: Obstetrics & Gynecology

## 2015-12-15 ENCOUNTER — Ambulatory Visit (HOSPITAL_COMMUNITY)
Admission: RE | Admit: 2015-12-15 | Discharge: 2015-12-15 | Disposition: A | Discharge status: Home / Self Care | Payer: Managed Care, Other (non HMO) | Source: Ambulatory Visit | Attending: Obstetrics & Gynecology | Admitting: Obstetrics & Gynecology

## 2015-12-15 DIAGNOSIS — D069 Carcinoma in situ of cervix, unspecified: Secondary | ICD-10-CM | POA: Insufficient documentation

## 2015-12-15 DIAGNOSIS — Z87891 Personal history of nicotine dependence: Secondary | ICD-10-CM | POA: Insufficient documentation

## 2015-12-15 DIAGNOSIS — I509 Heart failure, unspecified: Secondary | ICD-10-CM | POA: Insufficient documentation

## 2015-12-15 DIAGNOSIS — Z880 Allergy status to penicillin: Secondary | ICD-10-CM | POA: Insufficient documentation

## 2015-12-15 DIAGNOSIS — Z885 Allergy status to narcotic agent status: Secondary | ICD-10-CM | POA: Insufficient documentation

## 2015-12-15 DIAGNOSIS — M069 Rheumatoid arthritis, unspecified: Secondary | ICD-10-CM | POA: Insufficient documentation

## 2015-12-15 DIAGNOSIS — Z79899 Other long term (current) drug therapy: Secondary | ICD-10-CM | POA: Insufficient documentation

## 2015-12-15 DIAGNOSIS — F329 Major depressive disorder, single episode, unspecified: Secondary | ICD-10-CM | POA: Insufficient documentation

## 2015-12-15 DIAGNOSIS — Z91030 Bee allergy status: Secondary | ICD-10-CM | POA: Insufficient documentation

## 2015-12-15 HISTORY — PX: LEEP: SHX91

## 2015-12-15 HISTORY — PX: CERVICAL CONIZATION W/BX: SHX1330

## 2015-12-15 SURGERY — LEEP (LOOP ELECTROSURGICAL EXCISION PROCEDURE)
Anesthesia: General

## 2015-12-15 MED ORDER — ACETAMINOPHEN 325 MG PO TABS: 325.0000 mg | ORAL_TABLET | ORAL | Status: DC | PRN

## 2015-12-15 MED ORDER — PROPOFOL 10 MG/ML IV BOLUS
INTRAVENOUS | Status: AC
  Filled 2015-12-15: qty 20

## 2015-12-15 MED ORDER — LIDOCAINE HCL (CARDIAC) 20 MG/ML IV SOLN
INTRAVENOUS | Status: DC | PRN
  Administered 2015-12-15: 60 mg via INTRAVENOUS

## 2015-12-15 MED ORDER — PROPOFOL 10 MG/ML IV BOLUS
INTRAVENOUS | Status: DC | PRN
  Administered 2015-12-15: 50 mg via INTRAVENOUS
  Administered 2015-12-15: 150 mg via INTRAVENOUS

## 2015-12-15 MED ORDER — FENTANYL CITRATE (PF) 100 MCG/2ML IJ SOLN
INTRAMUSCULAR | Status: AC
  Filled 2015-12-15: qty 2

## 2015-12-15 MED ORDER — SCOPOLAMINE 1 MG/3DAYS TD PT72
MEDICATED_PATCH | TRANSDERMAL | Status: AC
  Administered 2015-12-15: 1.5 mg via TRANSDERMAL
  Filled 2015-12-15: qty 1

## 2015-12-15 MED ORDER — MIDAZOLAM HCL 2 MG/2ML IJ SOLN
INTRAMUSCULAR | Status: DC | PRN
  Administered 2015-12-15: 2 mg via INTRAVENOUS

## 2015-12-15 MED ORDER — EPHEDRINE 5 MG/ML INJ
INTRAVENOUS | Status: AC
  Filled 2015-12-15: qty 10

## 2015-12-15 MED ORDER — FENTANYL CITRATE (PF) 100 MCG/2ML IJ SOLN
INTRAMUSCULAR | Status: DC | PRN
  Administered 2015-12-15: 50 ug via INTRAVENOUS

## 2015-12-15 MED ORDER — LIDOCAINE HCL (CARDIAC) 20 MG/ML IV SOLN
INTRAVENOUS | Status: AC
  Filled 2015-12-15: qty 5

## 2015-12-15 MED ORDER — PHENYLEPHRINE HCL 10 MG/ML IJ SOLN
INTRAMUSCULAR | Status: DC | PRN
  Administered 2015-12-15: 100 ug via INTRAVENOUS
  Administered 2015-12-15 (×3): 40 ug via INTRAVENOUS
  Administered 2015-12-15: 100 ug via INTRAVENOUS
  Administered 2015-12-15: 40 ug via INTRAVENOUS

## 2015-12-15 MED ORDER — MEPERIDINE HCL 25 MG/ML IJ SOLN: 6.2500 mg | INTRAMUSCULAR | Status: DC | PRN

## 2015-12-15 MED ORDER — LIDOCAINE-EPINEPHRINE 1 %-1:100000 IJ SOLN
INTRAMUSCULAR | Status: DC | PRN
  Administered 2015-12-15: 10 mL

## 2015-12-15 MED ORDER — IODINE STRONG (LUGOLS) 5 % PO SOLN
ORAL | Status: AC
  Filled 2015-12-15: qty 1

## 2015-12-15 MED ORDER — FERRIC SUBSULFATE 259 MG/GM EX SOLN
CUTANEOUS | Status: AC
  Filled 2015-12-15: qty 8

## 2015-12-15 MED ORDER — DEXAMETHASONE SODIUM PHOSPHATE 4 MG/ML IJ SOLN
INTRAMUSCULAR | Status: AC
  Filled 2015-12-15: qty 1

## 2015-12-15 MED ORDER — SCOPOLAMINE 1 MG/3DAYS TD PT72
1.0000 | MEDICATED_PATCH | Freq: Once | TRANSDERMAL | Status: DC
  Administered 2015-12-15: 1.5 mg via TRANSDERMAL

## 2015-12-15 MED ORDER — KETOROLAC TROMETHAMINE 30 MG/ML IJ SOLN
INTRAMUSCULAR | Status: AC
  Filled 2015-12-15: qty 1

## 2015-12-15 MED ORDER — KETOROLAC TROMETHAMINE 30 MG/ML IJ SOLN
INTRAMUSCULAR | Status: DC | PRN
  Administered 2015-12-15: 30 mg via INTRAVENOUS

## 2015-12-15 MED ORDER — FERRIC SUBSULFATE SOLN
Status: DC | PRN
  Administered 2015-12-15: 1

## 2015-12-15 MED ORDER — ONDANSETRON HCL 4 MG/2ML IJ SOLN
INTRAMUSCULAR | Status: DC | PRN
  Administered 2015-12-15: 4 mg via INTRAVENOUS

## 2015-12-15 MED ORDER — PHENYLEPHRINE 40 MCG/ML (10ML) SYRINGE FOR IV PUSH (FOR BLOOD PRESSURE SUPPORT)
PREFILLED_SYRINGE | INTRAVENOUS | Status: AC
  Filled 2015-12-15: qty 10

## 2015-12-15 MED ORDER — EPHEDRINE SULFATE 50 MG/ML IJ SOLN
INTRAMUSCULAR | Status: DC | PRN
  Administered 2015-12-15 (×3): 5 mg via INTRAVENOUS

## 2015-12-15 MED ORDER — KETOROLAC TROMETHAMINE 30 MG/ML IJ SOLN: 30.0000 mg | Freq: Once | INTRAMUSCULAR | Status: DC

## 2015-12-15 MED ORDER — IBUPROFEN 800 MG PO TABS: 800.0000 mg | ORAL_TABLET | Freq: Three times a day (TID) | ORAL | Status: DC | PRN

## 2015-12-15 MED ORDER — DEXAMETHASONE SODIUM PHOSPHATE 10 MG/ML IJ SOLN
INTRAMUSCULAR | Status: DC | PRN
  Administered 2015-12-15: 4 mg via INTRAVENOUS

## 2015-12-15 MED ORDER — ONDANSETRON HCL 4 MG/2ML IJ SOLN: 4.0000 mg | Freq: Once | INTRAMUSCULAR | Status: DC | PRN

## 2015-12-15 MED ORDER — ACETIC ACID 5 % SOLN
Status: AC
  Filled 2015-12-15: qty 500

## 2015-12-15 MED ORDER — ACETAMINOPHEN 160 MG/5ML PO SOLN: 325.0000 mg | ORAL | Status: DC | PRN

## 2015-12-15 MED ORDER — ONDANSETRON HCL 4 MG/2ML IJ SOLN
INTRAMUSCULAR | Status: AC
  Filled 2015-12-15: qty 2

## 2015-12-15 MED ORDER — HYDROCODONE-ACETAMINOPHEN 5-325 MG PO TABS: 1.0000 | ORAL_TABLET | Freq: Four times a day (QID) | ORAL | Status: DC | PRN

## 2015-12-15 MED ORDER — FENTANYL CITRATE (PF) 100 MCG/2ML IJ SOLN: 25.0000 ug | INTRAMUSCULAR | Status: DC | PRN

## 2015-12-15 MED ORDER — LIDOCAINE-EPINEPHRINE 1 %-1:100000 IJ SOLN
INTRAMUSCULAR | Status: AC
  Filled 2015-12-15: qty 1

## 2015-12-15 MED ORDER — LACTATED RINGERS IV SOLN
INTRAVENOUS | Status: DC
Start: 2015-12-15 — End: 2015-12-15
  Administered 2015-12-15: 125 mL/h via INTRAVENOUS
  Administered 2015-12-15: 09:00:00 via INTRAVENOUS

## 2015-12-15 MED ORDER — MIDAZOLAM HCL 2 MG/2ML IJ SOLN
INTRAMUSCULAR | Status: AC
  Filled 2015-12-15: qty 2

## 2015-12-15 MED ORDER — IODINE STRONG (LUGOLS) 5 % PO SOLN
ORAL | Status: DC | PRN
  Administered 2015-12-15: 0.2 mL via ORAL

## 2015-12-15 MED ORDER — ACETIC ACID 4% SOLUTION
Status: DC | PRN
  Administered 2015-12-15: 1 via TOPICAL

## 2015-12-15 SURGICAL SUPPLY — 37 items
APPLICATOR COTTON TIP 6IN STRL (MISCELLANEOUS) ×2 IMPLANT
BLADE SURG 11 STRL SS (BLADE) ×2 IMPLANT
CATH ROBINSON RED A/P 16FR (CATHETERS) ×2 IMPLANT
CLOTH BEACON ORANGE TIMEOUT ST (SAFETY) ×2 IMPLANT
CONTAINER PREFILL 10% NBF 60ML (FORM) ×2 IMPLANT
COUNTER NEEDLE 1200 MAGNETIC (NEEDLE) IMPLANT
ELECT BALL LEEP 3MM BLK (ELECTRODE) IMPLANT
ELECT BALL LEEP 5MM RED (ELECTRODE) ×2 IMPLANT
ELECT LOOP LEEP RND 10X10 YLW (CUTTING LOOP) ×2
ELECT LOOP LEEP RND 15X12 GRN (CUTTING LOOP) ×2
ELECT LOOP LEEP RND 20X12 WHT (CUTTING LOOP)
ELECT REM PT RETURN 9FT ADLT (ELECTROSURGICAL) ×2
ELECTRODE LOOP LP RND 10X10YLW (CUTTING LOOP) ×1 IMPLANT
ELECTRODE LOOP LP RND 15X12GRN (CUTTING LOOP) ×1 IMPLANT
ELECTRODE LOOP LP RND 20X12WHT (CUTTING LOOP) IMPLANT
ELECTRODE REM PT RTRN 9FT ADLT (ELECTROSURGICAL) ×1 IMPLANT
EXTENDER ELECT LOOP LEEP 10CM (CUTTING LOOP) IMPLANT
GLOVE BIOGEL PI IND STRL 7.0 (GLOVE) ×2 IMPLANT
GLOVE BIOGEL PI INDICATOR 7.0 (GLOVE) ×2
GLOVE ECLIPSE 6.5 STRL STRAW (GLOVE) ×4 IMPLANT
GOWN STRL REUS W/TWL LRG LVL3 (GOWN DISPOSABLE) ×4 IMPLANT
HOSE NS SMOKE EVAC 7/8 X6 (MISCELLANEOUS) IMPLANT
NS IRRIG 1000ML POUR BTL (IV SOLUTION) ×2 IMPLANT
PACK VAGINAL MINOR WOMEN LF (CUSTOM PROCEDURE TRAY) ×2 IMPLANT
PAD OB MATERNITY 4.3X12.25 (PERSONAL CARE ITEMS) ×2 IMPLANT
PENCIL BUTTON HOLSTER BLD 10FT (ELECTRODE) ×2 IMPLANT
PENCIL SMOKE EVAC W/HOLSTER (ELECTROSURGICAL) ×2 IMPLANT
REDUCER FITTING SMOKE EVAC (MISCELLANEOUS) ×2 IMPLANT
SCOPETTES 8  STERILE (MISCELLANEOUS) ×2
SCOPETTES 8 STERILE (MISCELLANEOUS) ×2 IMPLANT
SPONGE SURGIFOAM ABS GEL 12-7 (HEMOSTASIS) IMPLANT
SUT SILK 2 0 FSL 18 (SUTURE) ×2 IMPLANT
SUT VIC AB 0 CT1 27 (SUTURE) ×2
SUT VIC AB 0 CT1 27XBRD ANBCTR (SUTURE) ×2 IMPLANT
TOWEL OR 17X24 6PK STRL BLUE (TOWEL DISPOSABLE) ×4 IMPLANT
TUBING SMOKE EVAC HOSE ADAPTER (MISCELLANEOUS) ×2 IMPLANT
WATER STERILE IRR 1000ML POUR (IV SOLUTION) ×2 IMPLANT

## 2015-12-15 NOTE — Op Note (Signed)
12/15/2015  8:29 AM  PATIENT:  Kimberly Shelton  55 y.o. female  PRE-OPERATIVE DIAGNOSIS:  CIN III  POST-OPERATIVE DIAGNOSIS:  CIN III  PROCEDURE:  Procedure(s): LOOP ELECTROSURGICAL EXCISION PROCEDURE (LEEP) with colpo CONIZATION CERVIX AND VAGINA WITH BIOPSIS   SURGEON:  Josephene Marrone SUZANNE  ASSISTANTS: OR staff   ANESTHESIA:   general  ESTIMATED BLOOD LOSS: 5cc  BLOOD ADMINISTERED:none   FLUIDS: 500cc LR  UOP: 75cc clear drained with I&O cath  SPECIMEN:  Cervical LEEP specimen, left vaginal sidewall biopsy, cervical biopsy at 6 o'clock (ectocervical specimen)  DISPOSITION OF SPECIMEN:  PATHOLOGY  FINDINGS: AWE almost circumferentially around the cervix with area of raised and irregular appearing tissue at 6 o'clock on cervix at TZ.  No abnormal vessels.  With vaginal colposcopy, area of decreased staining with Lugol's along left sidewall and area of decreased staining on ectocervix at 6 o'clock.  DESCRIPTION OF OPERATION: Patient was taken to the operating room.  She is placed in the supine position. SCDs were on her lower extremities and functioning properly. General anesthesia with an LMA was administered without difficulty.   Legs were then placed in the [facility] stirrups in the low lithotomy position. The legs were lifted to the high lithotomy position and the Betadine prep was used on the urethra before an in and out catheterization with a red rubber Foley catheter was performed. Approximately 75 cc of clear urine was noted. A bivalve speculum was placed the vagina. Betadine was used on the cervix as well.  A paracervical block of 1% lidocaine mixed one-to-one with epinephrine (1:100,000 units).  10 cc was used total.   The cervix was then painted with 5% acetic acid and the cervix was visualized with the colposcopy.  High powered magnification and green filter was used.  There was AWE almost circumferentially around the cervix with a raised area at 6 o'clock.    Grounding  pad located right thigh.  Cautery settings: 55 cut/55 coagulation.  Suction applied to coated speculum.  Entire transition zone excised with 12 x 15 loop in 4 passes.  Additional anterior and posterior cervical specimens and then a top hat specimen was obtained.  The last specimen was obtained with a 10 x 74mm loop.  Specimen(s) placed on cork and labeled for pathology.  Hemostasis obtained with ball cautery.  Then the cervix and vaginal was stained with Lugol's solution.  Vaginal colposcopy was performed.  Decreased staining on the ectocervix at 6 o'clock was noted as well as along the left vaginal sidewall.  Biopsies were obtained.  Monsel's solution was then applied to the cervix and biopsy locations.  Excellent hemostasis was noted.  At this point, the procedure was ended.  Sponge, lap, needle and instrument counts were correct x 2.  Pt was awakened and taken to the recovery room in stable condition.    COUNTS:  YES  PLAN OF CARE: Transfer to PACU

## 2015-12-15 NOTE — Discharge Instructions (Addendum)
°Post Anesthesia Home Care Instructions ° °Activity: °Get plenty of rest for the remainder of the day. A responsible adult should stay with you for 24 hours following the procedure.  °For the next 24 hours, DO NOT: °-Drive a car °-Operate machinery °-Drink alcoholic beverages °-Take any medication unless instructed by your physician °-Make any legal decisions or sign important papers. ° °Meals: °Start with liquid foods such as gelatin or soup. Progress to regular foods as tolerated. Avoid greasy, spicy, heavy foods. If nausea and/or vomiting occur, drink only clear liquids until the nausea and/or vomiting subsides. Call your physician if vomiting continues. ° °Special Instructions/Symptoms: °Your throat may feel dry or sore from the anesthesia or the breathing tube placed in your throat during surgery. If this causes discomfort, gargle with warm salt water. The discomfort should disappear within 24 hours. ° °If you had a scopolamine patch placed behind your ear for the management of post- operative nausea and/or vomiting: ° °1. The medication in the patch is effective for 72 hours, after which it should be removed.  Wrap patch in a tissue and discard in the trash. Wash hands thoroughly with soap and water. °2. You may remove the patch earlier than 72 hours if you experience unpleasant side effects which may include dry mouth, dizziness or visual disturbances. °3. Avoid touching the patch. Wash your hands with soap and water after contact with the patch. ° ° ° °Post-surgical Instructions, Outpatient Surgery ° °You may expect to feel dizzy, weak, and drowsy for as long as 24 hours after receiving the medicine that made you sleep (anesthetic). For the first 24 hours after your surgery:   °· Do not drive a car, ride a bicycle, participate in physical activities, or take public transportation until you are done taking narcotic pain medicines or as directed by Dr. Miller.  °· Do not drink alcohol or take tranquilizers.    °· Do not take medicine that has not been prescribed by your physicians.  °· Do not sign important papers or make important decisions while on narcotic pain medicines.  °· Have a responsible person with you.  ° °PAIN MANAGEMENT °· Motrin 800mg.  (This is the same as 4-200mg over the counter tablets of Motrin or ibuprofen.)  You may take this every eight hours or as needed for cramping.   °· Vicodin 5/325mg.  For more severe pain, take one or two tablets every four to six hours as needed for pain control.  (Remember that narcotic pain medications increase your risk of constipation.  If this becomes a problem, you may take an over the counter stool softener like Colace 100mg up to four times a day.) ° °DO'S AND DON'T'S °· Do not take a tub bath for one week.  You may shower on the first day after your surgery °· Do not do any heavy lifting for one to two weeks.  This increases the chance of bleeding. °· Do move around as you feel able.  Stairs are fine.  You may begin to exercise again as you feel able.  Do not lift any weights for two weeks. °· Do not put anything in the vagina for two weeks--no tampons, intercourse, or douching.   ° °REGULAR MEDIATIONS/VITAMINS: °· You may restart all of your regular medications as prescribed. °· You may restart all of your vitamins as you normally take them.   ° °PLEASE CALL OR SEEK MEDICAL CARE IF: °· You have persistent nausea and vomiting.  °· You have   trouble eating or drinking.  °· You have an oral temperature above 100.5.  °· You have constipation that is not helped by adjusting diet or increasing fluid intake. Pain medicines are a common cause of constipation.  °· You have heavy vaginal bleeding ° ° ° ° °

## 2015-12-15 NOTE — H&P (Signed)
Kimberly Shelton is an 55 y.o. female G1P1 MWF here for vaginal and cervical colposcopy and LEEP vs CKC due to CIN3 and anxiety about doing this in the office.  Pt has been counseled about office procedure vs OR procedure.  I have discourage conservative management at this point due to risks of SCC of cervix even with CIN3 biopsy results.  Pt has postponed this several times and is here for management.  Pertinent Gynecological History: Menses: post-menopausal Bleeding: none Contraception: PMP status DES exposure: denies Blood transfusions: none Sexually transmitted diseases: HPV Previous GYN Procedures: colposcopy  Last mammogram: normal Date: 2016  Last pap: abnormal: ASCUS wtih +HR HPV Date: 11/16 OB History: G1, P1   Menstrual History: Patient's last menstrual period was 09/06/2004.    Past Medical History  Diagnosis Date  . Abnormal Pap smear 09/2006    HGSIL CIN 2/VAIN/ CIN 3/VAIN-3 CIS  . RA (rheumatoid arthritis) (HCC) 07/2009  . Pneumonia     was hospitalized  . Foot fracture, left 10/16    hair-line fracture  . CHF (congestive heart failure) [facility])     Past Surgical History  Procedure Laterality Date  . Cervical biopsy  w/ loop electrode excision  2008    CIN 3  . Pilonidal cyst excision  1999    I&D  . Colonoscopy    . Wisdom tooth extraction      Family History  Problem Relation Age of Onset  . Diabetes Mother   . Rheum arthritis Mother   . Diabetes Father   . COPD Father   . Bronchitis Father   . Cancer Brother     Lung  . CAD Neg Hx     Social History:  reports that she quit smoking about 21 months ago. Her smoking use included Cigarettes. She has a 30 pack-year smoking history. She has never used smokeless tobacco. She reports that she does not drink alcohol or use illicit drugs.  Allergies:  Allergies  Allergen Reactions  . Bee Venom Anaphylaxis  . Codeine Other (See Comments)    "seeing spots"  . Penicillins Rash    Has patient had a PCN  reaction causing immediate rash, facial/tongue/throat swelling, SOB or lightheadedness with hypotension: No Has patient had a PCN reaction causing severe rash involving mucus membranes or skin necrosis: No Has patient had a PCN reaction that required hospitalization No Has patient had a PCN reaction occurring within the last 10 years: Yes If all of the above answers are "NO", then may proceed with Cephalosporin use.     Prescriptions prior to admission  Medication Sig Dispense Refill Last Dose  . albuterol (PROVENTIL HFA;VENTOLIN HFA) 108 (90 BASE) MCG/ACT inhaler Inhale 2 puffs into the lungs every 6 (six) hours as needed for wheezing or shortness of breath. 1 Inhaler 6 Taking  . escitalopram (LEXAPRO) 10 MG tablet TAKE 1 TABLET BY MOUTH EVERY DAY 90 tablet 1 Taking  . leflunomide (ARAVA) 20 MG tablet TAKE 1 TABLET BY MOUTH DAILY  1 Taking  . ORENCIA CLICKJECT 125 MG/ML SOAJ Inject 125 mg/mL into the skin once a week. saturday   Taking  . tiotropium (SPIRIVA) 18 MCG inhalation capsule Place 1 capsule (18 mcg total) into inhaler and inhale as needed. (Patient taking differently: Place 18 mcg into inhaler and inhale daily as needed (for asthma). ) 30 capsule 6 Taking  . ibuprofen (ADVIL,MOTRIN) 800 MG tablet Take 1 tablet (800 mg total) by mouth every 8 (eight) hours as needed. (Patient  not taking: Reported on 12/01/2015) 20 tablet 0 Not Taking at Unknown time    Review of Systems  All other systems reviewed and are negative.   Last menstrual period 09/06/2004. Physical Exam  Constitutional: She appears well-developed and well-nourished.  Cardiovascular: Normal rate and regular rhythm.   Respiratory: Effort normal and breath sounds normal.  GI: Soft. Bowel sounds are normal.  Skin: Skin is warm and dry.  Psychiatric: She has a normal mood and affect.    No results found for this or any previous visit (from the past 24 hour(s)).  No results found.  Assessment/Plan: 55 yo G1P1 MWF  with CIN3 here for colposcopy of vagina and cervix and LEEP.  Pt here and ready to proceed.  Kimberly Shelton 12/15/2015, 6:24 AM

## 2015-12-15 NOTE — Anesthesia Postprocedure Evaluation (Signed)
Anesthesia Post Note  Patient: Kimberly Shelton  Procedure(s) Performed: Procedure(s) (LRB): LOOP ELECTROSURGICAL EXCISION PROCEDURE (LEEP) with colpo (N/A) CONIZATION CERVIX WITH BIOPSY  (N/A)  Patient location during evaluation: PACU Anesthesia Type: General Level of consciousness: awake Pain management: pain level controlled Vital Signs Assessment: post-procedure vital signs reviewed and stable Respiratory status: spontaneous breathing Cardiovascular status: stable Postop Assessment: no signs of nausea or vomiting Anesthetic complications: no    Last Vitals:  Filed Vitals:   12/15/15 0815 12/15/15 0830  BP: 94/55 105/53  Pulse: 92 85  Temp:    Resp: 16 16    Last Pain: There were no vitals filed for this visit.               Mercy Malena JR,JOHN Susann Givens

## 2015-12-15 NOTE — Transfer of Care (Signed)
Immediate Anesthesia Transfer of Care Note  Patient: Kimberly Shelton  Procedure(s) Performed: Procedure(s) with comments: LOOP ELECTROSURGICAL EXCISION PROCEDURE (LEEP) with colpo (N/A) CONIZATION CERVIX WITH BIOPSY  (N/A) - POSSIBLE COLD KNIFE CONE.  Please have the microscope in the room  Patient Location: PACU  Anesthesia Type:General  Level of Consciousness: awake, alert , oriented and patient cooperative  Airway & Oxygen Therapy: Patient Spontanous Breathing and Patient connected to nasal cannula oxygen  Post-op Assessment: Report given to RN and Post -op Vital signs reviewed and stable  Post vital signs: Reviewed and stable  Last Vitals:  Filed Vitals:   12/15/15 0635  BP: 122/70  Pulse: 82  Temp: 36.8 C  Resp: 20    Complications: No apparent anesthesia complications

## 2015-12-15 NOTE — Anesthesia Procedure Notes (Signed)
Procedure Name: LMA Insertion Date/Time: 12/15/2015 7:26 AM Performed by: Yolonda Kida Pre-anesthesia Checklist: Patient identified, Emergency Drugs available, Suction available and Patient being monitored Patient Re-evaluated:Patient Re-evaluated prior to inductionOxygen Delivery Method: Circle system utilized Preoxygenation: Pre-oxygenation with 100% oxygen Intubation Type: IV induction Ventilation: Mask ventilation without difficulty LMA: LMA inserted LMA Size: 4.0 Number of attempts: 1 Airway Equipment and Method: Bite block Placement Confirmation: positive ETCO2,  CO2 detector and breath sounds checked- equal and bilateral Tube secured with: Tape Dental Injury: Teeth and Oropharynx as per pre-operative assessment

## 2015-12-15 NOTE — Telephone Encounter (Signed)
Called patient and left a message to call back and ask for Starla. If I am not available, patient needs scheduled as follows:   Jerene Bears, MD  Starla Curl            Starla  Pt has outpt procedure today. Please call and scheduled follow up with her in one month. Thanks.   MSM

## 2015-12-16 ENCOUNTER — Encounter (HOSPITAL_COMMUNITY): Payer: Self-pay | Admitting: Obstetrics & Gynecology

## 2016-01-13 ENCOUNTER — Ambulatory Visit (INDEPENDENT_AMBULATORY_CARE_PROVIDER_SITE_OTHER): Payer: Managed Care, Other (non HMO) | Admitting: Obstetrics & Gynecology

## 2016-01-13 ENCOUNTER — Encounter: Payer: Self-pay | Admitting: Obstetrics & Gynecology

## 2016-01-13 VITALS — BP 120/68 | HR 92 | Resp 14 | Ht 63.5 in | Wt 140.4 lb

## 2016-01-13 DIAGNOSIS — D069 Carcinoma in situ of cervix, unspecified: Secondary | ICD-10-CM | POA: Diagnosis not present

## 2016-01-13 NOTE — Progress Notes (Signed)
Post Operative Visit  Procedure:LOOP ELECTROSURGICAL EXCISION PROCEDURE (LEEP) with colpo  CONIZATION CERVIX WITH BIOPSY      Days Post-op: 1 month   Subjective: Pt here for follow up after having LEEP due to pt being unable to tolerate vaginal examination and evidence of CIN 3 noted on colposcopic biopsy on 08/21/15.  Pt did delay having procedure done colposcopy with LEEP was performed 12/15/15.  Results discussed with pt.  Her sister accompanies her.  I met her sister at the hospital the day of the procedure.  CIN2/3 with negative margins noted.  However a biopsy outside of this region was also biopsied showing CIN2.  Reviewed findings with pt.  conservative follow-up vs proceeding with hysterectomy now discussed.  As pt does not a lot of cervical tissue, if she has to have another procedure done to the cervix, I will likely not be able to perform any laparoscopic surgery due to inability to place KOH ring.  D/w pt and her sister all of the above.  Also reviewed that her Maureen Chatters is probably contributing to this but she must be on it to control her RA.     Pt declines doing anything else at this time.  She does agree to a follow-up pap smear in 3 months.     Objective: LMP 09/06/2004  EXAM General: alert and cooperative Resp: clear to auscultation bilaterally Cardio: regular rate and rhythm, S1, S2 normal, no murmur, click, rub or gallop GI: soft, non-tender; bowel sounds normal; no masses,  no organomegaly Extremities: extremities normal, atraumatic, no cyanosis or edema Vaginal Bleeding: none  Gyn:  NAEFG, vaginal pink and moist, cone bed healing well, no vaginal odor or discharge, no CMT  Assessment: s/p CIN 2/3 with conization but CIN2 on biopsy outside this area H/O RA on immunosuppressive  Plan:  Recheck 3 months for repeat Pap and HR HPV.  Consider hysterectomy as well.  PT would like to wait at this time.  ~20 minutes spent with patient >50% of time was in face to face  discussion of above.

## 2016-01-24 ENCOUNTER — Other Ambulatory Visit: Payer: Self-pay | Admitting: Internal Medicine

## 2016-02-09 ENCOUNTER — Encounter: Payer: Self-pay | Admitting: Internal Medicine

## 2016-02-09 ENCOUNTER — Ambulatory Visit (INDEPENDENT_AMBULATORY_CARE_PROVIDER_SITE_OTHER): Payer: Managed Care, Other (non HMO) | Admitting: Internal Medicine

## 2016-02-09 VITALS — BP 118/62 | HR 78 | Ht 63.5 in | Wt 141.0 lb

## 2016-02-09 DIAGNOSIS — Z8701 Personal history of pneumonia (recurrent): Secondary | ICD-10-CM | POA: Diagnosis not present

## 2016-02-09 DIAGNOSIS — Z720 Tobacco use: Secondary | ICD-10-CM

## 2016-02-09 DIAGNOSIS — R062 Wheezing: Secondary | ICD-10-CM

## 2016-02-09 DIAGNOSIS — Z87891 Personal history of nicotine dependence: Secondary | ICD-10-CM

## 2016-02-09 HISTORY — DX: Personal history of pneumonia (recurrent): Z87.01

## 2016-02-09 MED ORDER — TIOTROPIUM BROMIDE MONOHYDRATE 1.25 MCG/ACT IN AERS: 2.0000 | INHALATION_SPRAY | Freq: Every day | RESPIRATORY_TRACT | Status: DC

## 2016-02-09 NOTE — Patient Instructions (Addendum)
     ICD-9-CM ICD-10-CM   1. History of pneumonia V12.61 Z87.01   2. History of smoking V15.82 Z72.0   3. Wheezing 786.07 R06.2      Glad you are better but I think spiriva can help you due to perioidic wheezing Occ squeaks in lung are due to chronic scar teissue Too bad you are smoking vape  PLAN Quit vape when you can Try spiriva respimat (not dry powder which caused problems for you) daily Ok to hold off CXR today as discussed but reconsider in 9 months Use albuterol as needed Talk to PCP Kristian Covey, MD- I think you need shingles vaccine but get clearance from Dr Dierdre Forth first  Followup CXR 2 view in 9 months Followup with me in 9 months Keep up fu with DR Dierdre Forth for [place] as before

## 2016-02-09 NOTE — Progress Notes (Signed)
Subjective:     Patient ID: Kimberly Shelton, female   DOB: 01/30/1961, 55 y.o.   MRN: [id number]  HPI   OV 03/04/2014 Chief Complaint  Patient presents with  . Follow-up    HFU-D/c from [facility]. Pt states she has chest soreness from coughing. Pt states with the O2 her breathing has improved. C/o dyspnea when working with PT and cough with yellow mucous.    Admitted 02/16/2014 through 02/24/2014 with right-sided pneumococcal pneumonia with cavitation in the setting of smoking and rheumatoid arthritis on immune modulators. At this point in time she is off the immunomodulators. Dr. Dierdre Forth her rheumatologist is aware of this. She is 25% back to her baseline but still very fatigued and short of breath. She coughs a lot with home physical therapy. She brings up green mucus and sputum with her cough needed overall cough is significantly improved since discharge. Appetite is slowly returning. 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 She is requesting to stay off work for several weeks. On 2 L there is documentation that when she walks 2 minutes up saturation still stays at 95%.   Today in the office pon room air at rest t it is 93%. Walked 40 feet and desaturated to 89% but HR 150/min per tech: I think this is all deconditioning but not sure if she has cardiomyopathy from her illness   04/02/14 Follow up  Pt returns for follow up from recent sever critical illness from streptococcal PNA , Bacteremia, sepsis 02/2014.   She had a right-sided pneumococcal pneumonia with cavitation in the setting of smoking and rheumatoid arthritis on immune modulators Today PFT show FEV1 61% , ratio 87  , FVC 55 %  , NO sign BD response, Diffusing capacity  Decreased 57% Has not smoked.  Wants to return to work soon.  CXR shows persistent RUL/LLL cavitary lesions .  Says she is feeling so much better.  No fever, chest pain, orthopnea, edema or n/v/d.  Good  appetite . Seen by cards for tachycardia felt secondary to recent PN   OV 05/01/2014  Chief Complaint  Patient presents with  . Follow-up    Pt states her breathing has improved since last OV. Pt c/o prod cough with yellow and clear mucous and lower mid lumbar pain, pt thinks d/t RA. Pt doing duoneb only once a day.  Pt denies SOB.     Admitted 02/16/2014 through 02/24/2014 with right-sided pneumococcal pneumonia with cavitation in the setting of smoking and rheumatoid arthritis on immune modulators. Current followup is for the same   S: Presents with husband. Feels great overall. No fatigute. No dyspnea. Able to do all ADLs. Wants to return to work now and is asking for release to work. In terms of RA, she feels early morning stiffness of hand joint is returning. SHe is seeing Dr Dierdre Forth 05/01/14 and will discuss with him about return to immunomodulators. There are no other issues. CXR done after she left and at time of this note:    Dg Chest 2 View  04/30/2014   CLINICAL DATA:  Followup cavitary pneumonia  EXAM: CHEST  2 VIEW  COMPARISON:  04/02/2014  FINDINGS: Normal heart size, mediastinal contours, and pulmonary vascularity.  Patchy infiltrates are identified in RIGHT upper lobe and LEFT lower lobe consistent with pneumonia.  Cavitary foci identified previously in the in both the RIGHT upper and LEFT lower  lobes are still seen, the less evident in the RIGHT upper lobe.  Scattered interstitial prominence again identified.  No gross pleural effusion or pneumothorax.  Bones unremarkable.  IMPRESSION: Persistent infiltrates are identified in the RIGHT upper lobe and LEFT lower lobe with persistent visualization of cavitary foci.   Electronically Signed   By: Ulyses Southward M.D.   On: 04/30/2014 19:09      OV 08/09/2014  Chief Complaint  Patient presents with  . Follow-up    Pt here after CT scan. Pt denies change in breathing since last OV. Pt c/o prod cough with clear and green mucus.      Presents for Pneumococcal pneumonia followup that developed in setting of RA and Simponi  She is being maintained on ARAVA since her pneumococcal pneumonia earlier in 2013.  Now she is free of symptoms of respiratory tree. CT 08/08/14 shows further improvement but has architectural distortion. HEr RA is now significantly  active - per her and my talking 08/09/2014 with Dr Dierdre Forth. Patient is very apprehensive of starting ORENCIA recommended by Dr Dierdre Forth due to increased aecopd risk (She does not have copd), recurrent infection esp in setting of focal archiectrual distortion and recent pneumonia. She is very scared of dealing with RA disability v infection risk   Social and past and family: Mom had RA. She is upset abotu her disease  CT 08/08/14  IMPRESSION: 1. Emphysema with scattered scarring, volume loss, scattered bulla (some of which are still inflamed and with a somewhat cavitary appearance), and bilateral airway thickening. Much of this represents residua from prior multi lobar cavitary pneumonia, and is improved compared to the prior July CT scan. Given the degree of architectural distortion, scattered nodularity and opacities, and and irregular scarring, CT is not able to clear the lung of the possibility of malignancy, and surveillance imaging may be warranted.   Electronically Signed  By: [name]  On: 08/08/2014 15:32  OV 11/06/2014  Chief Complaint  Patient presents with  . Follow-up    Coughing up whitish colored phlegm, SOB with walking, not always, sometimes.      Presents for Pneumococcal pneumonia followup that developed in setting of RA and Simponi June 2015, with resultant architectural distortion in her lung anatomy  Last seen December 2015. Since then overall she's doing well. At baseline she only has mild cough with occasional white sputum. She also has exertional dyspnea when she climbs up a hill but clearly in cold air he did this is  relieved by rest. She is currently on 2 immunomodulators for her rheumatoid arthritis Orencia and Arava and this is helping her joints. She's not had any respiratory complications so far. I notice that she is not on Bactrim prophylaxis but she is concerned about the side effects. And she wants to talk to her rheumatologist Dr. Dierdre Forth about this  Past, Family, Social reviewed: no change since last visit   OV 02/10/2015  Chief Complaint  Patient presents with  . Follow-up    Pt stated her breathing feels it has improved since last OV. Pt here after PFT. Pt stated she has a little more trouble breathing when it is hot out. Pt c/o mild prod cough with clear and small light green mucus. Pt denies CP/tightness.    Follow-up chronic destructive pulmonary to changes following pneumococcal pneumonia in 2015 in the setting of previous heavy smoking and immunomodulatory treatment for rheumatoid arthritis   Since seeing me last in March 2016 she feels that  the albuterol inhaler that I tried for dyspnea and exertion and wheezing has helped. Dyspnea still persists. It is unchanged. It is mild. However it is improved with albuterol which she takes prior to exertion. There is no associated cough or. Wheezing is improved. No hemoptysis no fever no chills .Outside chart from Dr. Dierdre Forth her rheumatologist dated 02/04/2015 reviewed. She reported to him that she is compliant on Ornecia and arava with good relief and joint pain. Her dyspnea is mild chronic and stable. His advice for her to continue the same. He suggested that I look into her chronic residual dyspnea at this visit   Last pulmonary imaging with CT scan of the chest December 2015:residual changes + along with emphysema; report reviewed. Image not visualized  Pulmonary function test 02/10/2015 today personally reviewed image: FEV1 prebronchodilator 1.7 L/64%, ratio 69.  FEV1 postbronchodilator is 1.9 L/72% and is a 12% positive response with  broncho-dilator., FVC 2.4 L/72% and ratio of 79. Total lung capacity is 3.76 L/76%. DLCO is 18.5/80%. PFTs are consistent with restriction and a normal DLCO but in the prebronchodilator she has mild obstruction   OV 08/11/2015  Chief Complaint  Patient presents with  . Follow-up    Pt states her breathing has improved since last OV. Pt states she has only been using the spiriva as needed. Pt c/o prod cough with clear mucus and DOE with heavy exertion. pt denies CP/tightness.     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 She continues to do well. She is now on leflunomide and Orencia for her rheumatoid arthritis. With this rheumatoid arthritis symptoms have improved. She is follows up with Dr. Dierdre Forth in his new rheumatology practice [facility]. She feels that her wheezing has significantly improved. Shortness of breath significantly improved. She is not taking Spiriva anymore. She reported some hoarseness with it. She wants to continue to watch herself without Spiriva. She is up-to-date with her flu shot.   OV 02/09/2016  Chief Complaint  Patient presents with  . Follow-up    Pt reports that her breathing is doing well. Pt c/o occasional dry cough that is worse when using a vape for recreational use. Pt also c/o occasional wheeze with hot, humid weather and exertion. Pt does use albuterol HFA and her symptoms improve. Pt denies SOB/CP/tightness.        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 Doing well but has started smoking again though is nicotine-free vape smoking now. She did not want spiriva due to dpi and  throat irritation. But she is using albuterol atleast daily. She is having exertional wheeze but otherwise okayt. Dub Amis continues with Dr Dierdre Forth. She tells me she never had shingles vaccine. Otherwise well. Working at [employer] . Does not want cxr 02/09/2016 for fu. Wants to defer till next visit    has a past medical history of Abnormal Pap smear (09/2006); RA (rheumatoid arthritis) (HCC) (07/2009); Pneumonia; Foot fracture, left (10/16); and CHF (congestive heart failure) (HCC).   reports that she quit smoking about 1 years ago. Her smoking use included Cigarettes. She has a 30 pack-year smoking history. She has never used smokeless tobacco.  Past Surgical History  Procedure Laterality Date  .  Cervical biopsy  w/ loop electrode excision  2008    CIN 3  . Pilonidal cyst excision  1999    I&D  . Colonoscopy    . Wisdom tooth extraction    . Leep N/A 12/15/2015    Procedure: LOOP ELECTROSURGICAL EXCISION PROCEDURE (LEEP) with colpo;  Surgeon: Jerene Bears, MD;  Location: [facility];  Service: Gynecology;  Laterality: N/A;  . Cervical conization w/bx N/A 12/15/2015    Procedure: CONIZATION CERVIX WITH BIOPSY ;  Surgeon: Jerene Bears, MD;  Location: [facility];  Service: Gynecology;  Laterality: N/A;  POSSIBLE COLD KNIFE CONE.  Please have the microscope in the room    Allergies  Allergen Reactions  . Bee Venom Anaphylaxis  . Codeine Other (See Comments)    "seeing spots"  . Penicillins Rash    H     Immunization History  Administered Date(s) Administered  . Influenza,inj,Quad PF,36+ Mos 06/25/2015  . Influenza-Unspecified 06/06/2014  . Pneumococcal Polysaccharide-23 02/18/2014    Family History  Problem Relation Age of Onset  . Diabetes Mother   . Rheum arthritis Mother   . Diabetes Father   . COPD Father   . Bronchitis Father   . Cancer Brother     Lung  . CAD Neg Hx      Current outpatient prescriptions:  .  escitalopram (LEXAPRO) 10 MG tablet, TAKE 1 TABLET BY  MOUTH EVERY DAY, Disp: 90 tablet, Rfl: 1 .  leflunomide (ARAVA) 20 MG tablet, TAKE 1 TABLET BY MOUTH DAILY, Disp: , Rfl: 1 .  ORENCIA CLICKJECT 125 MG/ML SOAJ, Inject 125 mg/mL into the skin once a week. saturday, Disp: , Rfl:  .  PROVENTIL HFA 108 (90 Base) MCG/ACT inhaler, INHALE 2 PUFFS INTO THE LUNGS EVERY 6 HOURS AS NEEDED FOR WHEEZING OR SHORTNESS OF BREATH, Disp: 6.7 g, Rfl: 0 .  tiotropium (SPIRIVA) 18 MCG inhalation capsule, Place 1 capsule (18 mcg total) into inhaler and inhale as needed. (Patient not taking: Reported on 02/09/2016), Disp: 30 capsule, Rfl: 6     Review of Systems     Objective:   Physical Exam  Constitutional: She is oriented to person, place, and time. She appears well-developed and well-nourished. No distress.  HENT:  Head: Normocephalic and atraumatic.  Right Ear: External ear normal.  Left Ear: External ear normal.  Mouth/Throat: Oropharynx is clear and moist. No oropharyngeal exudate.  Eyes: Conjunctivae and EOM are normal. Pupils are equal, round, and reactive to light. Right eye exhibits no discharge. Left eye exhibits no discharge. No scleral icterus.  Neck: Normal range of motion. Neck supple. No JVD present. No tracheal deviation present. No thyromegaly present.  Cardiovascular: Normal rate, regular rhythm, normal heart sounds and intact distal pulses.  Exam reveals no gallop and no friction rub.   No murmur heard. Pulmonary/Chest: Effort normal and breath sounds normal. No respiratory distress. She has no wheezes. She has no rales. She exhibits no tenderness.  Occ. Faint scattered crackles  Abdominal: Soft. Bowel sounds are normal. She exhibits no distension and no mass. There is no tenderness. There is no rebound and no guarding.  Musculoskeletal: Normal range of motion. She exhibits no edema or tenderness.  Lymphadenopathy:    She has no cervical adenopathy.  Neurological: She is alert and oriented to person, place, and time. She has normal  reflexes. No cranial nerve deficit. She exhibits normal muscle tone. Coordination normal.  Skin: Skin is warm and dry. No rash noted. She is not  diaphoretic. No erythema. No pallor.  Psychiatric: She has a normal mood and affect. Her behavior is normal. Judgment and thought content normal.  Vitals reviewed.  Filed Vitals:   02/09/16 1650  BP: 118/62  Pulse: 78  Height: 5' 3.5" (1.613 m)  Weight: 141 lb (63.957 kg)  SpO2: 96%        Assessment:       ICD-9-CM ICD-10-CM   1. History of pneumonia V12.61 Z87.01   2. History of smoking V15.82 Z72.0   3. Wheezing 786.07 R06.2        Plan:      Glad you are better but I think spiriva can help you due to perioidic wheezing Occ squeaks in lung are due to chronic scar teissue Too bad you are smoking vape  PLAN Quit vape when you can Try spiriva respimat (not dry powder which caused problems for you) daily Ok to hold off CXR today as discussed but reconsider in 9 months Use albuterol as needed Talk to PCP Kristian Covey, MD- I think you need shingles vaccine but get clearance from Dr Dierdre Forth first  Followup CXR 2 view in 9 months Followup with me in 9 months Keep up fu with DR Dierdre Forth for [place] as before

## 2016-02-19 ENCOUNTER — Other Ambulatory Visit: Payer: Self-pay | Admitting: Obstetrics & Gynecology

## 2016-02-19 NOTE — Telephone Encounter (Signed)
Medication refill request: Escitalopram 10 MG Last AEX:  07/21/15 SM Next AEX: patient is scheduled for office visits on 04/15/16, 09/02/16, and 11/19/16 Last MMG (if hormonal medication request): 07/21/15 BIRADS1 negative Refill authorized: 08/19/15 #90 w/1 refills; today please advise

## 2016-03-10 ENCOUNTER — Other Ambulatory Visit: Payer: Self-pay | Admitting: Internal Medicine

## 2016-04-01 ENCOUNTER — Other Ambulatory Visit: Payer: Self-pay | Admitting: Internal Medicine

## 2016-04-15 ENCOUNTER — Encounter: Payer: Self-pay | Admitting: Obstetrics & Gynecology

## 2016-04-15 ENCOUNTER — Ambulatory Visit (INDEPENDENT_AMBULATORY_CARE_PROVIDER_SITE_OTHER): Payer: Managed Care, Other (non HMO) | Admitting: Obstetrics & Gynecology

## 2016-04-15 VITALS — BP 112/70 | HR 74 | Resp 14 | Ht 63.5 in | Wt 140.8 lb

## 2016-04-15 DIAGNOSIS — B977 Papillomavirus as the cause of diseases classified elsewhere: Secondary | ICD-10-CM

## 2016-04-15 DIAGNOSIS — N871 Moderate cervical dysplasia: Secondary | ICD-10-CM

## 2016-04-15 LAB — CALCIUM: Calcium: 9.8 mg/dL (ref 8.6–10.4)

## 2016-04-15 NOTE — Progress Notes (Signed)
55 y.o. 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  Kimberly Shelton has hx of prior LEEP in 2008.  Kimberly Shelton also has hx of +HR HPV but neg 16/18 testing in 2015.  This has not been repeated.    Pt reports, other than separation issues, Kimberly Shelton has been doing well.  No vaginal bleeding.  No vaginal discharge.  No pelvic pain.  LMP:  2006, exact date unknown  Kimberly Shelton is not SA at this time.  Past Medical History:  Diagnosis Date  . Abnormal Pap smear 09/2006   HGSIL CIN 2/VAIN/ CIN 3/VAIN-3 CIS  . CHF (congestive heart failure) (HCC)   . Foot fracture, left 10/16   hair-line fracture  . Pneumonia    was hospitalized  . RA (rheumatoid arthritis) (HCC) 07/2009   Past Surgical History:  Procedure Laterality Date  . CERVICAL BIOPSY  W/ LOOP ELECTRODE EXCISION  2008   CIN 3  . CERVICAL CONIZATION W/BX N/A 12/15/2015   Procedure: CONIZATION CERVIX WITH BIOPSY ;  Surgeon: Jerene Bears, MD;  Location: [facility];  Service: Gynecology;  Laterality: N/A;  POSSIBLE COLD KNIFE CONE.  Please have the microscope in the room  . COLONOSCOPY    . LEEP N/A 12/15/2015   Procedure: LOOP ELECTROSURGICAL EXCISION PROCEDURE (LEEP) with colpo;  Surgeon: Jerene Bears, MD;  Location: [facility];  Service: Gynecology;  Laterality: N/A;  . PILONIDAL CYST EXCISION  1999   I&D  . WISDOM TOOTH EXTRACTION     All:  Bees, PCN,, Codeine Meds:  Reviewed in EPIC  Review of Systems  All other systems reviewed and are negative. 2 EXAM: BP 112/70 (BP Location: Right Arm, Patient Position: Sitting, Cuff Size: Normal)   Pulse 74   Resp 14   Ht 5' 3.5" (1.613 m)   Wt 140 lb 12.8 oz (63.9 kg)   LMP 09/06/2004   BMI 24.55 kg/m  General appearance:  WNWD Female, NAD Abd:  Soft, NT, ND, no masses, hernias, or organomegaly Pelvic exam:  VULVA: normal appearing vulva with no masses, tenderness or lesions VAGINA: normal appearing vagina with normal color and discharge, no  lesions CERVIX: normal appearing cervix without discharge or lesions, ectropion present UTERUS: uterus is normal size, shape, consistency and nontender ADNEXA: normal adnexa in size, nontender and no masses RECTAL: normal rectal, no masses  PAP: Pap smear done today   Assessment: 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  If LGSIL or <, repeat 4 months.  If HGSIL, will need to discuss repeat possible colposcopy vs more aggressive therapy.  If normal, will repeat pap and HR HPV at AEX 1 year from LEEP.  Timing of appts is off due to delay (from pt) in scheduling LEEP. Repeat calcium level today.

## 2016-04-19 LAB — IPS PAP SMEAR ONLY

## 2016-04-21 ENCOUNTER — Telehealth: Payer: Self-pay | Admitting: *Deleted

## 2016-04-21 NOTE — Telephone Encounter (Signed)
Spoke with patient. Advised of message and results as seen below from Dr.Miller. She is agreeable and verbalizes understanding. Kimberly Shelton is scheduled for 11/19/2016 at 9 am with Dr.Miller. Aware she will have follow up pap at that appointment. 06 recall placed.  Routing to provider for final review. Patient agreeable to disposition. Will close encounter.

## 2016-04-21 NOTE — Telephone Encounter (Signed)
LM to call back.

## 2016-04-21 NOTE — Telephone Encounter (Signed)
-----   Message from Jerene Bears, MD sent at 04/21/2016  9:58 AM EDT ----- Please let pt know pap is ASCUS.  No high grade cells noted.  Needs pap and HR HPV 6 months.  08 recall.  Needs to be at six months.  Calcium level repeat was normal.  Out of current recall.

## 2016-06-15 ENCOUNTER — Ambulatory Visit: Payer: Managed Care, Other (non HMO) | Admitting: Family Medicine

## 2016-07-23 ENCOUNTER — Ambulatory Visit (INDEPENDENT_AMBULATORY_CARE_PROVIDER_SITE_OTHER): Payer: Managed Care, Other (non HMO) | Admitting: Family Medicine

## 2016-07-23 VITALS — BP 120/78 | HR 91 | Temp 97.4°F | Ht 62.75 in | Wt 143.5 lb

## 2016-07-23 DIAGNOSIS — G5602 Carpal tunnel syndrome, left upper limb: Secondary | ICD-10-CM | POA: Diagnosis not present

## 2016-07-23 NOTE — Progress Notes (Signed)
Subjective:     Patient ID: Kimberly Shelton, female   DOB: 08-Apr-1961, 55 y.o.   MRN: [id number]  HPI Patient is right-hand dominant and is seen today with left hand wrist,hand, and forearm pain over the past several weeks. Her job involves lots of typing. She has done a lot of yard work recently. She especially has night pain which she describes as burning pain and frequent numbness involving the thumb, index, middle, and ring finger. Not aware of any definite weakness. Pain is severe at times. Not relieved with Aleve. She has history of rheumatoid arthritis followed by rheumatology and is own immunosuppressive therapy. Denies prior history of carpal tunnel syndrome. No neck pain. No radiculopathy symptoms.  Past Medical History:  Diagnosis Date  . Abnormal Pap smear 09/2006   HGSIL CIN 2/VAIN/ CIN 3/VAIN-3 CIS  . CHF (congestive heart failure) (HCC)   . Foot fracture, left 10/16   hair-line fracture  . Pneumonia    was hospitalized  . RA (rheumatoid arthritis) (HCC) 07/2009   Past Surgical History:  Procedure Laterality Date  . CERVICAL BIOPSY  W/ LOOP ELECTRODE EXCISION  2008   CIN 3  . CERVICAL CONIZATION W/BX N/A 12/15/2015   Procedure: CONIZATION CERVIX WITH BIOPSY ;  Surgeon: Jerene Bears, MD;  Location: [facility];  Service: Gynecology;  Laterality: N/A;  POSSIBLE COLD KNIFE CONE.  Please have the microscope in the room  . COLONOSCOPY    . LEEP N/A 12/15/2015   Procedure: LOOP ELECTROSURGICAL EXCISION PROCEDURE (LEEP) with colpo;  Surgeon: Jerene Bears, MD;  Location: [facility];  Service: Gynecology;  Laterality: N/A;  . PILONIDAL CYST EXCISION  1999   I&D  . WISDOM TOOTH EXTRACTION      reports that she quit smoking about 2 years ago. Her smoking use included Cigarettes. She has a 30.00 pack-year smoking history. She has never used smokeless tobacco. She reports that she does not drink alcohol or use drugs. family history includes Bronchitis in her father; COPD in her father; Cancer  in her brother; Diabetes in her father and mother; Rheum arthritis in her mother. Allergies  Allergen Reactions  . Bee Venom Anaphylaxis  . Codeine Other (See Comments)    "seeing spots"  . Penicillins Rash    Has patient had a PCN reaction causing immediate rash, facial/tongue/throat swelling, SOB or lightheadedness with hypotension: No Has patient had a PCN reaction causing severe rash involving mucus membranes or skin necrosis: No Has patient had a PCN reaction that required hospitalization No Has patient had a PCN reaction occurring within the last 10 years: Yes If all of the above answers are "NO", then may proceed with Cephalosporin use.      Review of Systems  Respiratory: Negative for shortness of breath.   Cardiovascular: Negative for chest pain.  Musculoskeletal: Negative for neck pain.  Neurological: Positive for numbness. Negative for weakness.       Objective:   Physical Exam  Constitutional: She appears well-developed and well-nourished.  Cardiovascular: Normal rate and regular rhythm.   Pulmonary/Chest: Effort normal and breath sounds normal. No respiratory distress. She has no wheezes. She has no rales.  Musculoskeletal:  No obvious muscle atrophy in her upper extremities. Full range of motion all joints of the hand. No localized bony tenderness. Tinel sign is positive on the left  Neurological:  No obvious upper extremity weakness. She has 2+ symmetric reflexes upper extremities       Assessment:  Probable carpal tunnel syndrome left wrist    Plan:     -Recommend wrist splinting especially at night. -Handout given. -She has follow-up in December and reassess then.  Kristian Covey MD [facility] at [facility]

## 2016-07-23 NOTE — Patient Instructions (Signed)
Carpal Tunnel Syndrome Carpal tunnel syndrome is a condition that causes pain in your hand and arm. The carpal tunnel is a narrow area located on the palm side of your wrist. Repeated wrist motion or certain diseases may cause swelling within the tunnel. This swelling pinches the main nerve in the wrist (median nerve). What are the causes? This condition may be caused by:  Repeated wrist motions.  Wrist injuries.  Arthritis.  A cyst or tumor in the carpal tunnel.  Fluid buildup during pregnancy.  Sometimes the cause of this condition is not known. What increases the risk? This condition is more likely to develop in:  People who have jobs that cause them to repeatedly move their wrists in the same motion, such as butchers and cashiers.  Women.  People with certain conditions, such as: ? Diabetes. ? Obesity. ? An underactive thyroid (hypothyroidism). ? Kidney failure.  What are the signs or symptoms? Symptoms of this condition include:  A tingling feeling in your fingers, especially in your thumb, index, and middle fingers.  Tingling or numbness in your hand.  An aching feeling in your entire arm, especially when your wrist and elbow are bent for long periods of time.  Wrist pain that goes up your arm to your shoulder.  Pain that goes down into your palm or fingers.  A weak feeling in your hands. You may have trouble grabbing and holding items.  Your symptoms may feel worse during the night. How is this diagnosed? This condition is diagnosed with a medical history and physical exam. You may also have tests, including:  An electromyogram (EMG). This test measures electrical signals sent by your nerves into the muscles.  X-rays.  How is this treated? Treatment for this condition includes:  Lifestyle changes. It is important to stop doing or modify the activity that caused your condition.  Physical or occupational therapy.  Medicines for pain and inflammation.  This may include medicine that is injected into your wrist.  A wrist splint.  Surgery.  Follow these instructions at home: If you have a splint:  Wear it as told by your health care provider. Remove it only as told by your health care provider.  Loosen the splint if your fingers become numb and tingle, or if they turn cold and blue.  Keep the splint clean and dry. General instructions  Take over-the-counter and prescription medicines only as told by your health care provider.  Rest your wrist from any activity that may be causing your pain. If your condition is work related, talk to your employer about changes that can be made, such as getting a wrist pad to use while typing.  If directed, apply ice to the painful area: ? Put ice in a plastic bag. ? Place a towel between your skin and the bag. ? Leave the ice on for 20 minutes, 2-3 times per day.  Keep all follow-up visits as told by your health care provider. This is important.  Do any exercises as told by your health care provider, physical therapist, or occupational therapist. Contact a health care provider if:  You have new symptoms.  Your pain is not controlled with medicines.  Your symptoms get worse. This information is not intended to replace advice given to you by your health care provider. Make sure you discuss any questions you have with your health care provider. Document Released: 08/20/2000 Document Revised: 01/01/2016 Document Reviewed: 01/08/2015 Elsevier Interactive Patient Education  2017 [employer]  

## 2016-07-23 NOTE — Progress Notes (Signed)
Pre visit review using our clinic review tool, if applicable. No additional management support is needed unless otherwise documented below in the visit note. 

## 2016-07-28 ENCOUNTER — Other Ambulatory Visit: Payer: Self-pay | Admitting: Internal Medicine

## 2016-08-18 ENCOUNTER — Encounter: Payer: Managed Care, Other (non HMO) | Admitting: Family Medicine

## 2016-08-19 ENCOUNTER — Other Ambulatory Visit: Payer: Self-pay | Admitting: Obstetrics and Gynecology

## 2016-08-19 NOTE — Telephone Encounter (Signed)
Medication refill request: lexapro Last AEX:  07/21/15 SM  Next AEX: 11/19/16 SM  Last MMG (if hormonal medication request): 07/22/15 BIRADS1:Neg  Refill authorized: 02/19/16 #[id number]/[id number]. Today please advise.

## 2016-08-20 ENCOUNTER — Encounter: Payer: Self-pay | Admitting: Cardiovascular Disease

## 2016-09-01 ENCOUNTER — Ambulatory Visit (INDEPENDENT_AMBULATORY_CARE_PROVIDER_SITE_OTHER): Payer: Managed Care, Other (non HMO) | Admitting: Family Medicine

## 2016-09-01 ENCOUNTER — Encounter: Payer: Self-pay | Admitting: Family Medicine

## 2016-09-01 VITALS — BP 118/66 | HR 100 | Temp 98.0°F | Ht 63.0 in | Wt 144.2 lb

## 2016-09-01 DIAGNOSIS — Z23 Encounter for immunization: Secondary | ICD-10-CM

## 2016-09-01 DIAGNOSIS — Z0000 Encounter for general adult medical examination without abnormal findings: Secondary | ICD-10-CM

## 2016-09-01 LAB — COMPREHENSIVE METABOLIC PANEL
ALBUMIN: 4.3 g/dL (ref 3.5–5.2)
ALT: 11 U/L (ref 0–35)
AST: 15 U/L (ref 0–37)
Alkaline Phosphatase: 179 U/L — ABNORMAL HIGH (ref 39–117)
BUN: 10 mg/dL (ref 6–23)
CALCIUM: 9.5 mg/dL (ref 8.4–10.5)
CHLORIDE: 102 meq/L (ref 96–112)
CO2: 29 meq/L (ref 19–32)
CREATININE: 0.71 mg/dL (ref 0.40–1.20)
GFR: 90.24 mL/min (ref >60.00)
Glucose, Bld: 93 mg/dL (ref 70–99)
POTASSIUM: 4 meq/L (ref 3.5–5.1)
Sodium: 139 mEq/L (ref 135–145)
Total Bilirubin: 0.4 mg/dL (ref 0.2–1.2)
Total Protein: 7.6 g/dL (ref 6.0–8.3)

## 2016-09-01 LAB — LIPID PANEL
CHOL/HDL RATIO: 2
Cholesterol: 188 mg/dL (ref 0–200)
HDL: 77.4 mg/dL (ref >39.00)
LDL CALC: 88 mg/dL (ref 0–99)
NONHDL: 110.41
TRIGLYCERIDES: 113 mg/dL (ref 0.0–149.0)
VLDL: 22.6 mg/dL (ref 0.0–40.0)

## 2016-09-01 LAB — CBC WITH DIFFERENTIAL/PLATELET
BASOS PCT: 1 % (ref 0.0–3.0)
Basophils Absolute: 0.1 10*3/uL (ref 0.0–0.1)
EOS ABS: 0.2 10*3/uL (ref 0.0–0.7)
EOS PCT: 3.8 % (ref 0.0–5.0)
HEMATOCRIT: 36.6 % (ref 36.0–46.0)
HEMOGLOBIN: 12.3 g/dL (ref 12.0–15.0)
LYMPHS PCT: 20.6 % (ref 12.0–46.0)
Lymphs Abs: 1.1 10*3/uL (ref 0.7–4.0)
MCHC: 33.5 g/dL (ref 30.0–36.0)
MCV: 86.1 fl (ref 78.0–100.0)
Monocytes Absolute: 0.5 10*3/uL (ref 0.1–1.0)
Monocytes Relative: 10.1 % (ref 3.0–12.0)
NEUTROS ABS: 3.4 10*3/uL (ref 1.4–7.7)
Neutrophils Relative %: 64.5 % (ref 43.0–77.0)
PLATELETS: 296 10*3/uL (ref 150.0–400.0)
RBC: 4.26 Mil/uL (ref 3.87–5.11)
RDW: 13.7 % (ref 11.5–15.5)
WBC: 5.3 10*3/uL (ref 4.0–10.5)

## 2016-09-01 LAB — TSH: TSH: 1.12 u[IU]/mL (ref 0.35–4.50)

## 2016-09-01 NOTE — Progress Notes (Signed)
Pre visit review using our clinic review tool, if applicable. No additional management support is needed unless otherwise documented below in the visit note. 

## 2016-09-01 NOTE — Progress Notes (Signed)
Subjective:     Patient ID: Kimberly Shelton, female   DOB: 1961/07/15, 55 y.o.   MRN: [id number]  HPI Patient seen for physical exam. She has history of rheumatoid arthritis is followed by rheumatology. She's had past history of depression which is currently stable. Mild intermittent asthma. Last tetanus is unknown. Already had flu vaccine. Colonoscopy up-to-date. She sees gynecologist regularly and is getting regular mammograms and Pap smears.  Past Medical History:  Diagnosis Date  . Abnormal Pap smear 09/2006   HGSIL CIN 2/VAIN/ CIN 3/VAIN-3 CIS  . CHF (congestive heart failure) (HCC)   . Foot fracture, left 10/16   hair-line fracture  . Pneumonia    was hospitalized  . RA (rheumatoid arthritis) (HCC) 07/2009   Past Surgical History:  Procedure Laterality Date  . CERVICAL BIOPSY  W/ LOOP ELECTRODE EXCISION  2008   CIN 3  . CERVICAL CONIZATION W/BX N/A 12/15/2015   Procedure: CONIZATION CERVIX WITH BIOPSY ;  Surgeon: Jerene Bears, MD;  Location: [facility];  Service: Gynecology;  Laterality: N/A;  POSSIBLE COLD KNIFE CONE.  Please have the microscope in the room  . COLONOSCOPY    . LEEP N/A 12/15/2015   Procedure: LOOP ELECTROSURGICAL EXCISION PROCEDURE (LEEP) with colpo;  Surgeon: Jerene Bears, MD;  Location: [facility];  Service: Gynecology;  Laterality: N/A;  . PILONIDAL CYST EXCISION  1999   I&D  . WISDOM TOOTH EXTRACTION      reports that she quit smoking about 2 years ago. Her smoking use included Cigarettes. She has a 30.00 pack-year smoking history. She has never used smokeless tobacco. She reports that she does not drink alcohol or use drugs. family history includes Bronchitis in her father; COPD in her father; Cancer in her brother; Diabetes in her father and mother; Rheum arthritis in her mother. Allergies  Allergen Reactions  . Bee Venom Anaphylaxis  . Codeine Other (See Comments)    "seeing spots"  . Penicillins Rash    Has patient had a PCN reaction causing immediate  rash, facial/tongue/throat swelling, SOB or lightheadedness with hypotension: No Has patient had a PCN reaction causing severe rash involving mucus membranes or skin necrosis: No Has patient had a PCN reaction that required hospitalization No Has patient had a PCN reaction occurring within the last 10 years: Yes If all of the above answers are "NO", then may proceed with Cephalosporin use.      Review of Systems  Constitutional: Negative for activity change, appetite change, fatigue, fever and unexpected weight change.  HENT: Negative for ear pain, hearing loss, sore throat and trouble swallowing.   Eyes: Negative for visual disturbance.  Respiratory: Negative for cough and shortness of breath.   Cardiovascular: Negative for chest pain and palpitations.  Gastrointestinal: Negative for abdominal pain, blood in stool, constipation and diarrhea.  Endocrine: Negative for polydipsia and polyuria.  Genitourinary: Negative for dysuria and hematuria.  Musculoskeletal: Negative for arthralgias, back pain and myalgias.  Skin: Negative for rash.  Neurological: Negative for dizziness, syncope and headaches.  Hematological: Negative for adenopathy.  Psychiatric/Behavioral: Negative for confusion and dysphoric mood.       Objective:   Physical Exam  Constitutional: She is oriented to person, place, and time. She appears well-developed and well-nourished.  HENT:  Head: Normocephalic and atraumatic.  Eyes: EOM are normal. Pupils are equal, round, and reactive to light.  Neck: Normal range of motion. Neck supple. No thyromegaly present.  Cardiovascular: Normal rate, regular rhythm and normal heart  sounds.   No murmur heard. Pulmonary/Chest: Breath sounds normal. No respiratory distress. She has no wheezes. She has no rales.  Abdominal: Soft. Bowel sounds are normal. She exhibits no distension and no mass. There is no tenderness. There is no rebound and no guarding.  Musculoskeletal: Normal range  of motion. She exhibits no edema.  Lymphadenopathy:    She has no cervical adenopathy.  Neurological: She is alert and oriented to person, place, and time. She displays normal reflexes. No cranial nerve deficit.  Skin: No rash noted.  Psychiatric: She has a normal mood and affect. Her behavior is normal. Judgment and thought content normal.       Assessment:     Physical exam. Patient needs tetanus booster.    Plan:     -Obtain screening lab work-CBC, comp metabolic panel, TSH, lipid panel -Tetanus booster given -Continue with yearly flu vaccine -She will continue with regular GYN follow-up -Regular weightbearing exercise such as walking and daily calcium and vitamin D  Kristian Covey MD [facility]

## 2016-09-02 ENCOUNTER — Ambulatory Visit: Payer: Managed Care, Other (non HMO) | Admitting: Obstetrics & Gynecology

## 2016-09-10 ENCOUNTER — Ambulatory Visit: Payer: Managed Care, Other (non HMO) | Admitting: Cardiovascular Disease

## 2016-11-05 ENCOUNTER — Other Ambulatory Visit: Payer: Self-pay | Admitting: Obstetrics & Gynecology

## 2016-11-05 DIAGNOSIS — Z1231 Encounter for screening mammogram for malignant neoplasm of breast: Secondary | ICD-10-CM

## 2016-11-17 NOTE — Progress Notes (Signed)
[name] here for annual exam.  Reports her divorce will be finalized next month.     Patient's last menstrual period was 09/06/2004.          Sexually active: No.  The current method of family planning is post menopausal status.    Exercising: Yes.    walking Smoker:  Former smoker   Health Maintenance: Pap:  04/15/16 ASCUS, 07/21/15 ASCUS, HR HPV positive, 08/21/15 colposcopy- CIN 1 & 3  History of abnormal Pap:  yes MMG:  07/21/15 BIRADS 1 negative, [facility]  Colonoscopy:  2016- repeat 10 years with Dr. Loreta Ave  BMD:   06/21/14 osteopenia  TDaP:  09/01/16  Pneumonia vaccine(s):  02/18/14  Zostavax:   never Hep C testing: drawn today Screening Labs: PCP, Hb today: PCP   reports that she quit smoking about 2 years ago. Her smoking use included Cigarettes. She has a 30.00 pack-year smoking history. She has never used smokeless tobacco. She reports that she does not drink alcohol or use drugs.  Past Medical History:  Diagnosis Date  . Abnormal Pap smear 09/2006   HGSIL CIN 2/VAIN/ CIN 3/VAIN-3 CIS  . CHF (congestive heart failure) (HCC)   . Foot fracture, left 10/16   hair-line fracture  . Pneumonia    was hospitalized  . RA (rheumatoid arthritis) (HCC) 07/2009    Past Surgical History:  Procedure Laterality Date  . CERVICAL BIOPSY  W/ LOOP ELECTRODE EXCISION  2008   CIN 3  . CERVICAL CONIZATION W/BX N/A 12/15/2015   Procedure: CONIZATION CERVIX WITH BIOPSY ;  Surgeon: Jerene Bears, MD;  Location: [facility];  Service: Gynecology;  Laterality: N/A;  POSSIBLE COLD KNIFE CONE.  Please have the microscope in the room  . COLONOSCOPY    . LEEP N/A 12/15/2015   Procedure: LOOP ELECTROSURGICAL EXCISION PROCEDURE (LEEP) with colpo;  Surgeon: Jerene Bears, MD;  Location: [facility];  Service: Gynecology;  Laterality: N/A;  . PILONIDAL CYST EXCISION  1999   I&D  . WISDOM TOOTH EXTRACTION      Current Outpatient Prescriptions   Medication Sig Dispense Refill  . escitalopram (LEXAPRO) 10 MG tablet TAKE 1 TABLET DAILY 90 tablet 1  . leflunomide (ARAVA) 20 MG tablet Take 20 mg by mouth daily.    Marland Kitchen ORENCIA CLICKJECT 125 MG/ML SOAJ Inject 125 mg/mL into the skin once a week. saturday    . PROAIR HFA 108 (90 Base) MCG/ACT inhaler INHALE 2 PUFFS INTO THE LUNGS EVERY 6 HOURS AS NEEDED FOR WHEEZING OR SHORTNESS OF BREATH 8.5 g 5  . PROVENTIL HFA 108 (90 Base) MCG/ACT inhaler INHALE 2 PUFFS INTO THE LUNGS EVERY 6 HOURS AS NEEDED FOR WHEEZING OR SHORTNESS OF BREATH 6.7 g 2  . SPIRIVA RESPIMAT 1.25 MCG/ACT AERS INL 2 PFS INTO THE LUNGS D  8   No current facility-administered medications for this visit.     Family History  Problem Relation Age of Onset  . Diabetes Mother   . Rheum arthritis Mother   . Diabetes Father   . COPD Father   . Bronchitis Father   . Cancer Brother     Lung  . CAD Neg Hx     ROS:  Pertinent items are noted in HPI.  Otherwise, a comprehensive ROS was negative.  Exam:   BP (!) 124/58 (BP Location: Right Arm, Patient Position: Sitting, Cuff Size: Normal)   Pulse 68   Resp 14  Ht 5\' 3"  (1.6 m)   Wt 144 lb (65.3 kg)   LMP 09/06/2004   BMI 25.51 kg/m   Weight change: +2#  Height: 5\' 3"  (160 cm)  Ht Readings from Last 3 Encounters:  11/19/16 5\' 3"  (1.6 m)  09/01/16 5\' 3"  (1.6 m)  07/23/16 5' 2.75" (1.594 m)    General appearance: alert, cooperative and appears stated age Head: Normocephalic, without obvious abnormality, atraumatic Neck: no adenopathy, supple, symmetrical, trachea midline and thyroid normal to inspection and palpation Lungs: clear to auscultation bilaterally Breasts: normal appearance, no masses or tenderness Heart: regular rate and rhythm Abdomen: soft, non-tender; bowel sounds normal; no masses,  no organomegaly Extremities: extremities normal, atraumatic, no cyanosis or edema Skin: Skin color, texture, turgor normal. No rashes or lesions Lymph nodes: Cervical,  supraclavicular, and axillary nodes normal. No abnormal inguinal nodes palpated Neurologic: Grossly normal   Pelvic: External genitalia:  no lesions              Urethra:  normal appearing urethra with no masses, tenderness or lesions              Bartholins and Skenes: normal                 Vagina: normal appearing vagina with normal color and discharge, no lesions              Cervix: anterior in the vaginal, small cervix due to prior LEEPs, no visible lesions              Pap taken: Yes.   Bimanual Exam:  Uterus:  normal size, contour, position, consistency, mobility, non-tender              Adnexa: normal adnexa and no mass, fullness, tenderness               Rectovaginal: Confirms               Anus:  normal sphincter tone, no lesions  Chaperone was present for exam.  A:  Well Woman with normal exam PMP, no HRT H/o Cin 3, LEEP 2008 then repeat LEEP 4/17 with CIN 2/3 Rheumatoid arthritis, on immunosuppression H/O pneumonia  P:   Mammogram guidelines reviewed pap smear and HR HPV obtained today Sees Dr. yearly, Dr. 07/25/16 (pulmonology) yearly, and Dr. 2009 yearly, Dr. 5/17 every 3 months Hep C and HIV obtained today return annually or prn

## 2016-11-19 ENCOUNTER — Ambulatory Visit: Payer: Managed Care, Other (non HMO)

## 2016-11-19 ENCOUNTER — Other Ambulatory Visit (HOSPITAL_COMMUNITY)
Admission: RE | Admit: 2016-11-19 | Discharge: 2016-11-19 | Disposition: A | Discharge status: Home / Self Care | Payer: Managed Care, Other (non HMO) | Source: Ambulatory Visit | Attending: Obstetrics & Gynecology | Admitting: Obstetrics & Gynecology

## 2016-11-19 ENCOUNTER — Encounter: Payer: Self-pay | Admitting: Obstetrics & Gynecology

## 2016-11-19 ENCOUNTER — Ambulatory Visit (INDEPENDENT_AMBULATORY_CARE_PROVIDER_SITE_OTHER): Payer: 59 | Admitting: Obstetrics & Gynecology

## 2016-11-19 ENCOUNTER — Ambulatory Visit
Admission: RE | Admit: 2016-11-19 | Discharge: 2016-11-19 | Disposition: A | Discharge status: Home / Self Care | Payer: Managed Care, Other (non HMO) | Source: Ambulatory Visit | Attending: Obstetrics & Gynecology | Admitting: Obstetrics & Gynecology

## 2016-11-19 VITALS — BP 124/58 | HR 68 | Resp 14 | Ht 63.0 in | Wt 144.0 lb

## 2016-11-19 DIAGNOSIS — Z01419 Encounter for gynecological examination (general) (routine) without abnormal findings: Secondary | ICD-10-CM | POA: Diagnosis not present

## 2016-11-19 DIAGNOSIS — Z1231 Encounter for screening mammogram for malignant neoplasm of breast: Secondary | ICD-10-CM

## 2016-11-19 DIAGNOSIS — D069 Carcinoma in situ of cervix, unspecified: Secondary | ICD-10-CM | POA: Diagnosis not present

## 2016-11-19 DIAGNOSIS — Z124 Encounter for screening for malignant neoplasm of cervix: Secondary | ICD-10-CM | POA: Diagnosis present

## 2016-11-19 DIAGNOSIS — N871 Moderate cervical dysplasia: Secondary | ICD-10-CM

## 2016-11-19 DIAGNOSIS — Z205 Contact with and (suspected) exposure to viral hepatitis: Secondary | ICD-10-CM

## 2016-11-19 DIAGNOSIS — Z202 Contact with and (suspected) exposure to infections with a predominantly sexual mode of transmission: Secondary | ICD-10-CM

## 2016-11-19 LAB — HEPATITIS C ANTIBODY: HCV AB: NEGATIVE

## 2016-11-20 LAB — HIV ANTIBODY (ROUTINE TESTING W REFLEX): HIV: NONREACTIVE

## 2016-11-22 ENCOUNTER — Telehealth: Payer: Self-pay | Admitting: *Deleted

## 2016-11-22 NOTE — Telephone Encounter (Signed)
Message left to return call to Kimberly Shelton at [phone].    

## 2016-11-22 NOTE — Telephone Encounter (Signed)
Patient notified of results and verbalized understanding. Aware when pap smear results are back, our office will let her know.

## 2016-11-22 NOTE — Telephone Encounter (Signed)
-----   Message from Jerene Bears, MD sent at 11/20/2016  6:09 AM EDT ----- Please inform HIV and Hep C testing negative.  Pap is not back yet.

## 2016-11-26 LAB — CYTOLOGY - PAP
DIAGNOSIS: NEGATIVE
HPV (WINDOPATH): NOT DETECTED

## 2017-01-13 ENCOUNTER — Telehealth: Payer: Self-pay | Admitting: Family Medicine

## 2017-01-13 MED ORDER — EPINEPHRINE 0.3 MG/0.3ML IJ SOAJ: 0.3000 mg | Freq: Once | INTRAMUSCULAR | 0 refills | Status: AC

## 2017-01-13 NOTE — Telephone Encounter (Signed)
° ° °  Pt call to ask for a RX for a EPI pen   Pharmacy [facility]

## 2017-01-13 NOTE — Telephone Encounter (Signed)
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  OK to send in rx for Epipen.

## 2017-01-13 NOTE — Telephone Encounter (Signed)
Rx done. 

## 2017-02-15 ENCOUNTER — Other Ambulatory Visit: Payer: Self-pay | Admitting: Obstetrics & Gynecology

## 2017-02-15 NOTE — Telephone Encounter (Signed)
Medication refill request: Escitalopram Last AEX:  11/19/16 SM Next AEX: 11/24/17 SM Last MMG (if hormonal medication request): 11/19/16 BIRADS1, [facility] Refill authorized: 08/19/16 #90 1R. Please advise. Thank you.

## 2017-05-31 ENCOUNTER — Ambulatory Visit (INDEPENDENT_AMBULATORY_CARE_PROVIDER_SITE_OTHER): Payer: 59 | Admitting: Family Medicine

## 2017-05-31 ENCOUNTER — Encounter: Payer: Self-pay | Admitting: Family Medicine

## 2017-05-31 VITALS — BP 110/80 | HR 100 | Temp 98.3°F | Wt 146.4 lb

## 2017-05-31 DIAGNOSIS — Z23 Encounter for immunization: Secondary | ICD-10-CM | POA: Diagnosis not present

## 2017-05-31 DIAGNOSIS — R21 Rash and other nonspecific skin eruption: Secondary | ICD-10-CM

## 2017-05-31 NOTE — Patient Instructions (Signed)
Continue with the Benadryl at night May add Zyrtec, Xyzal, or Allegra during the day May also add Pepcid or zantac twice daily Consider anti-itch soap such as Aveeno

## 2017-05-31 NOTE — Progress Notes (Signed)
Subjective:     Patient ID: Kimberly Shelton, female   DOB: [birth year], 56 y.o.   MRN: [id number]  HPI Patient seen as a work in with onset of acute rash last Friday. She describes slightly raised erythematous rash mostly on her extremities. Lesions have faded somewhat but she still has pruritus. She took some Benadryl over the weekend which did help the itching. She denies any prior history of recurrent hives. No fevers or chills. She has history of rheumatoid arthritis followed by rheumatology and relatively stable. No recent change of medications. No change of soaps or detergents. No known food allergies.  She has had increased stress including recent divorce and also some job stress and wonders if that may be contributing. She notices that heat seems to exacerbate her pruritus.  Past Medical History:  Diagnosis Date  . Abnormal Pap smear 09/2006   HGSIL CIN 2/VAIN/ CIN 3/VAIN-3 CIS  . CHF (congestive heart failure) (HCC)   . Foot fracture, left 10/16   hair-line fracture  . Pneumonia    was hospitalized  . RA (rheumatoid arthritis) (HCC) 07/2009   Past Surgical History:  Procedure Laterality Date  . CERVICAL BIOPSY  W/ LOOP ELECTRODE EXCISION  2008   CIN 3  . CERVICAL CONIZATION W/BX N/A 12/15/2015   Procedure: CONIZATION CERVIX WITH BIOPSY ;  Surgeon: Jerene Bears, MD;  Location: [facility];  Service: Gynecology;  Laterality: N/A;  POSSIBLE COLD KNIFE CONE.  Please have the microscope in the room  . COLONOSCOPY    . LEEP N/A 12/15/2015   Procedure: LOOP ELECTROSURGICAL EXCISION PROCEDURE (LEEP) with colpo;  Surgeon: Jerene Bears, MD;  Location: [facility];  Service: Gynecology;  Laterality: N/A;  . PILONIDAL CYST EXCISION  1999   I&D  . WISDOM TOOTH EXTRACTION      reports that she quit smoking about 3 years ago. Her smoking use included Cigarettes. She has a 30.00 pack-year smoking history. She has never used smokeless tobacco. She reports that she does not drink alcohol or use  drugs. family history includes Bronchitis in her father; COPD in her father; Cancer in her brother; Diabetes in her father and mother; Rheum arthritis in her mother. Allergies  Allergen Reactions  . Bee Venom Anaphylaxis  . Codeine Other (See Comments)    "seeing spots"  . Penicillins Rash    Has patient had a PCN reaction causing immediate rash, facial/tongue/throat swelling, SOB or lightheadedness with hypotension: No Has patient had a PCN reaction causing severe rash involving mucus membranes or skin necrosis: No Has patient had a PCN reaction that required hospitalization No Has patient had a PCN reaction occurring within the last 10 years: Yes If all of the above answers are "NO", then may proceed with Cephalosporin use.      Review of Systems  Constitutional: Negative for chills and fever.  HENT: Negative for sore throat.   Skin: Positive for rash.       Objective:   Physical Exam  Constitutional: She appears well-developed and well-nourished.  Cardiovascular: Normal rate and regular rhythm.   Pulmonary/Chest: Effort normal and breath sounds normal. No respiratory distress. She has no wheezes. She has no rales.  Skin:  She has a couple of excoriated areas up her lower extremities but no signs of cellulitis. She has a few fading erythematous papules/hive-like lesions. Nonscaly. No vesicles. No pustules.       Assessment:     Skin rash. Question fading urticaria. Possibly worsened by recent  stress    Plan:     -Continue Benadryl as needed at night -Consider daytime use of Allegra or Zyrtec and they will also add Pepcid or Zantac as needed -Touch base if rash not fading of the next week or so  Kristian Covey MD [facility]

## 2017-06-27 ENCOUNTER — Telehealth: Payer: Self-pay | Admitting: *Deleted

## 2017-06-27 NOTE — Telephone Encounter (Signed)
No.  She will still get Prevanar 13 at 64 and repeat Pneumovax at 65.

## 2017-06-27 NOTE — Telephone Encounter (Signed)
Left message on machine for patient to return our call concerning pneumonia vaccine.

## 2017-06-27 NOTE — Telephone Encounter (Signed)
Patient would like to know if she should get a second pneumonia vaccine?

## 2017-07-05 NOTE — Telephone Encounter (Signed)
Patient is aware 

## 2017-07-05 NOTE — Telephone Encounter (Signed)
Left message on machine for patient to return our call 

## 2017-07-09 ENCOUNTER — Other Ambulatory Visit: Payer: Self-pay | Admitting: Internal Medicine

## 2017-09-27 DIAGNOSIS — M0589 Other rheumatoid arthritis with rheumatoid factor of multiple sites: Secondary | ICD-10-CM | POA: Diagnosis not present

## 2017-09-27 DIAGNOSIS — G5602 Carpal tunnel syndrome, left upper limb: Secondary | ICD-10-CM | POA: Diagnosis not present

## 2017-09-27 DIAGNOSIS — Z79899 Other long term (current) drug therapy: Secondary | ICD-10-CM | POA: Diagnosis not present

## 2017-11-07 ENCOUNTER — Other Ambulatory Visit: Payer: Self-pay | Admitting: Internal Medicine

## 2017-11-07 ENCOUNTER — Other Ambulatory Visit: Payer: Self-pay | Admitting: Obstetrics & Gynecology

## 2017-11-07 DIAGNOSIS — Z1231 Encounter for screening mammogram for malignant neoplasm of breast: Secondary | ICD-10-CM

## 2017-11-11 ENCOUNTER — Telehealth: Payer: Self-pay | Admitting: Internal Medicine

## 2017-11-11 MED ORDER — ALBUTEROL SULFATE HFA 108 (90 BASE) MCG/ACT IN AERS: 1.0000 | INHALATION_SPRAY | Freq: Four times a day (QID) | RESPIRATORY_TRACT | 0 refills | Status: DC | PRN

## 2017-11-11 NOTE — Telephone Encounter (Signed)
Pt requesting refill on proair.  Pt scheduled for rov as she has not been seen since 2017.  I sent 1 inhaler in and advised that she must keep rov for any further refills.  Pt expressed understanding.  Nothing further needed.

## 2017-11-16 ENCOUNTER — Other Ambulatory Visit: Payer: Self-pay | Admitting: Obstetrics & Gynecology

## 2017-11-16 NOTE — Telephone Encounter (Signed)
Medication refill request: Lexapro 10mg  #90 Last AEX:  11-19-16 Next AEX: 11-24-17 Last MMG (if hormonal medication request): 11-19-16 12-31-1989. 11-24-17 Refill authorized: Please advise

## 2017-11-23 NOTE — Progress Notes (Signed)
57 y.o. G1P1 Divorced CaucasianF here for annual exam.  Got laid off from job this year.  She took early retirement.  She is looking for a part time job.  Thinks this would be a good balance for her.  Finally finished divorce.    Denies vaginal bleeding.    Patient's last menstrual period was 09/06/2004.          Sexually active: No.  The current method of family planning is post menopausal status.    Exercising: Yes.    walking, yardwork Smoker:  no  Health Maintenance: Pap: 07-21-15 ASCUS HPV HR+, 04-15-16 ASCUS, 11-19-16 neg HPV HR neg History of abnormal Pap:  yes MMG:  11-19-16 category c density Birads 1:neg Colonoscopy:  2016 f/u 10 yrs BMD:   2015 osteopenia, T score -1.8, -1.4 TDaP:  2017 Pneumonia vaccine(s):  2015 Shingrix:  no Hep C testing: neg 2018 Screening Labs: 12/17   reports that she quit smoking about 3 years ago. Her smoking use included cigarettes. She has a 30.00 pack-year smoking history. She has never used smokeless tobacco. She reports that she does not drink alcohol or use drugs.  Past Medical History:  Diagnosis Date  . Abnormal Pap smear 09/2006   HGSIL CIN 2/VAIN/ CIN 3/VAIN-3 CIS  . CHF (congestive heart failure) (HCC)   . Foot fracture, left 10/16   hair-line fracture  . Pneumonia    was hospitalized  . RA (rheumatoid arthritis) (HCC) 07/2009    Past Surgical History:  Procedure Laterality Date  . CERVICAL BIOPSY  W/ LOOP ELECTRODE EXCISION  2008   CIN 3  . CERVICAL CONIZATION W/BX N/A 12/15/2015   Procedure: CONIZATION CERVIX WITH BIOPSY ;  Surgeon: Jerene Bears, MD;  Location: [facility];  Service: Gynecology;  Laterality: N/A;  POSSIBLE COLD KNIFE CONE.  Please have the microscope in the room  . COLONOSCOPY    . LEEP N/A 12/15/2015   Procedure: LOOP ELECTROSURGICAL EXCISION PROCEDURE (LEEP) with colpo;  Surgeon: Jerene Bears, MD;  Location: [facility];  Service: Gynecology;  Laterality: N/A;  . PILONIDAL CYST EXCISION  1999   I&D  . WISDOM TOOTH  EXTRACTION      Current Outpatient Medications  Medication Sig Dispense Refill  . albuterol (PROAIR HFA) 108 (90 Base) MCG/ACT inhaler Inhale 1-2 puffs into the lungs every 6 (six) hours as needed for wheezing or shortness of breath. Must keep ov for future refills. 8.5 g 0  . Diclofenac Sodium-Capsaicin (INFLAMMACIN CO)     . escitalopram (LEXAPRO) 10 MG tablet TAKE 1 TABLET BY MOUTH EVERY DAY 90 tablet 0  . ORENCIA CLICKJECT 125 MG/ML SOAJ Inject 125 mg/mL into the skin once a week. saturday     No current facility-administered medications for this visit.     Family History  Problem Relation Age of Onset  . Diabetes Mother   . Rheum arthritis Mother   . Diabetes Father   . COPD Father   . Bronchitis Father   . Cancer Brother        Lung  . CAD Neg Hx     Review of Systems  Genitourinary: Positive for urgency.       Urine leakage with cough or sneeze  Musculoskeletal: Positive for joint pain and myalgias.  All other systems reviewed and are negative.   Exam:   BP 120/62   Pulse 70   Resp 16   Ht 5' 2.75" (1.594 m)   Wt 145 lb (65.8  kg)   LMP 09/06/2004   BMI 25.89 kg/m    Height: 5' 2.75" (159.4 cm)  Ht Readings from Last 3 Encounters:  11/24/17 5' 2.75" (1.594 m)  11/19/16 5\' 3"  (1.6 m)  09/01/16 5\' 3"  (1.6 m)    General appearance: alert, cooperative and appears stated age Head: Normocephalic, without obvious abnormality, atraumatic Neck: no adenopathy, supple, symmetrical, trachea midline and thyroid normal to inspection and palpation Lungs: clear to auscultation bilaterally Breasts: normal appearance, no masses or tenderness Heart: regular rate and rhythm Abdomen: soft, non-tender; bowel sounds normal; no masses,  no organomegaly Extremities: extremities normal, atraumatic, no cyanosis or edema Skin: Skin color, texture, turgor normal. No rashes or lesions Lymph nodes: Cervical, supraclavicular, and axillary nodes normal. No abnormal inguinal nodes  palpated Neurologic: Grossly normal   Pelvic: External genitalia:  no lesions              Urethra:  normal appearing urethra with no masses, tenderness or lesions              Bartholins and Skenes: normal                 Vagina: normal appearing vagina with normal color and discharge, no lesions              Cervix: no lesions              Pap taken: Yes.   Bimanual Exam:  Uterus:  normal size, contour, position, consistency, mobility, non-tender              Adnexa: normal adnexa and no mass, fullness, tenderness               Rectovaginal: Confirms               Anus:  normal sphincter tone, no lesions  Chaperone was present for exam.  A:  Well Woman with normal exam PMP, no HRT H/O CIN 2 with LEEP 2008, then repeat LEEP 4/17 with CIN 2/3 RA, on Orencia  P:   Mammogram guidelines reviewed.  Doing 3D. Lab work done every three months with Dr. 2009 and done 12/17 with Dr. Dierdre Forth pap smear and HR HPV obtained today D/w pt Shingrix vaccine.  She wants to check with insurance Plan BMD in another year or two Return annually or prn

## 2017-11-24 ENCOUNTER — Encounter: Payer: Self-pay | Admitting: Obstetrics & Gynecology

## 2017-11-24 ENCOUNTER — Other Ambulatory Visit (HOSPITAL_COMMUNITY)
Admission: RE | Admit: 2017-11-24 | Discharge: 2017-11-24 | Disposition: A | Discharge status: Home / Self Care | Payer: BLUE CROSS/BLUE SHIELD | Source: Ambulatory Visit | Attending: Obstetrics & Gynecology | Admitting: Obstetrics & Gynecology

## 2017-11-24 ENCOUNTER — Ambulatory Visit: Payer: BLUE CROSS/BLUE SHIELD | Admitting: Obstetrics & Gynecology

## 2017-11-24 ENCOUNTER — Other Ambulatory Visit: Payer: Self-pay

## 2017-11-24 ENCOUNTER — Ambulatory Visit: Payer: 59

## 2017-11-24 VITALS — BP 120/62 | HR 70 | Resp 16 | Ht 62.75 in | Wt 145.0 lb

## 2017-11-24 DIAGNOSIS — Z01419 Encounter for gynecological examination (general) (routine) without abnormal findings: Secondary | ICD-10-CM | POA: Diagnosis not present

## 2017-11-24 DIAGNOSIS — N871 Moderate cervical dysplasia: Secondary | ICD-10-CM | POA: Insufficient documentation

## 2017-11-24 DIAGNOSIS — Z124 Encounter for screening for malignant neoplasm of cervix: Secondary | ICD-10-CM | POA: Insufficient documentation

## 2017-11-26 LAB — CYTOLOGY - PAP
DIAGNOSIS: UNDETERMINED — AB
HPV (WINDOPATH): NOT DETECTED

## 2017-11-30 ENCOUNTER — Telehealth: Payer: Self-pay

## 2017-11-30 DIAGNOSIS — R87610 Atypical squamous cells of undetermined significance on cytologic smear of cervix (ASC-US): Secondary | ICD-10-CM

## 2017-11-30 NOTE — Telephone Encounter (Signed)
-----   Message from Jerene Bears, MD sent at 11/29/2017  4:46 PM EDT ----- Please let pt know her pap showed ASCUS findings but HR HPV was negative.  It is good that the HR HPV testing is negative and the ASCUS finding could just be due to menopause but given her hx I think she should have a repeat colposcopy.  Thanks.  CC:  Gara Kroner, CMA

## 2017-11-30 NOTE — Telephone Encounter (Signed)
Spoke with patient. Results given. Patient verbalizes understanding. Colposcopy scheduled for 12/13/2017 at 10 am with Dr.Miller.  Instructions given. Motrin 800 mg po x , one hour before appointment with food. Make sure to eat a meal before appointment and drink plenty of fluids. Patient agreeable and verbalized understanding of all instructions. Order placed. Encounter closed.

## 2017-12-02 ENCOUNTER — Telehealth: Payer: Self-pay | Admitting: Obstetrics & Gynecology

## 2017-12-02 NOTE — Telephone Encounter (Signed)
Spoke with patient. All questions answered. Patient is agreeable to proceed with colposcopy as scheduled on 12/13/2017. Will close encounter.

## 2017-12-02 NOTE — Telephone Encounter (Signed)
I spoke with patient and conveyed her benefits for colposcopy procedure. She has some additional questions about the procedure.

## 2017-12-07 ENCOUNTER — Ambulatory Visit
Admission: RE | Admit: 2017-12-07 | Discharge: 2017-12-07 | Disposition: A | Discharge status: Home / Self Care | Payer: BLUE CROSS/BLUE SHIELD | Source: Ambulatory Visit | Attending: Obstetrics & Gynecology | Admitting: Obstetrics & Gynecology

## 2017-12-07 DIAGNOSIS — Z1231 Encounter for screening mammogram for malignant neoplasm of breast: Secondary | ICD-10-CM | POA: Diagnosis not present

## 2017-12-08 ENCOUNTER — Ambulatory Visit (INDEPENDENT_AMBULATORY_CARE_PROVIDER_SITE_OTHER)
Admission: RE | Admit: 2017-12-08 | Discharge: 2017-12-08 | Disposition: A | Discharge status: Home / Self Care | Payer: BLUE CROSS/BLUE SHIELD | Source: Ambulatory Visit | Attending: Internal Medicine | Admitting: Internal Medicine

## 2017-12-08 ENCOUNTER — Ambulatory Visit (INDEPENDENT_AMBULATORY_CARE_PROVIDER_SITE_OTHER): Payer: BLUE CROSS/BLUE SHIELD | Admitting: Internal Medicine

## 2017-12-08 ENCOUNTER — Encounter: Payer: Self-pay | Admitting: Internal Medicine

## 2017-12-08 VITALS — BP 110/82 | HR 81 | Ht 62.75 in | Wt 148.4 lb

## 2017-12-08 DIAGNOSIS — Z8701 Personal history of pneumonia (recurrent): Secondary | ICD-10-CM

## 2017-12-08 DIAGNOSIS — R918 Other nonspecific abnormal finding of lung field: Secondary | ICD-10-CM | POA: Diagnosis not present

## 2017-12-08 DIAGNOSIS — Z87891 Personal history of nicotine dependence: Secondary | ICD-10-CM

## 2017-12-08 DIAGNOSIS — R011 Cardiac murmur, unspecified: Secondary | ICD-10-CM | POA: Diagnosis not present

## 2017-12-08 NOTE — Progress Notes (Signed)
Subjective:     Patient ID: Kimberly Shelton, female   DOB: 05/10/1961, 57 y.o.   MRN: [id number]  HPI   OV 03/04/2014 Chief Complaint  Patient presents with  . Follow-up    HFU-D/c from [facility]. Pt states she has chest soreness from coughing. Pt states with the O2 her breathing has improved. C/o dyspnea when working with PT and cough with yellow mucous.    Admitted 02/16/2014 through 02/24/2014 with right-sided pneumococcal pneumonia with cavitation in the setting of smoking and rheumatoid arthritis on immune modulators. At this point in time she is off the immunomodulators. Dr. Dierdre Forth her rheumatologist is aware of this. She is 25% back to her baseline but still very fatigued and short of breath. She coughs a lot with home physical therapy. She brings up green mucus and sputum with her cough needed overall cough is significantly improved since discharge. Appetite is slowly returning. 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 She is requesting to stay off work for several weeks. On 2 L there is documentation that when she walks 2 minutes up saturation still stays at 95%.   Today in the office pon room air at rest t it is 93%. Walked 40 feet and desaturated to 89% but HR 150/min per tech: I think this is all deconditioning but not sure if she has cardiomyopathy from her illness   04/02/14 Follow up  Pt returns for follow up from recent sever critical illness from streptococcal PNA , Bacteremia, sepsis 02/2014.   She had a right-sided pneumococcal pneumonia with cavitation in the setting of smoking and rheumatoid arthritis on immune modulators Today PFT show FEV1 61% , ratio 87  , FVC 55 %  , NO sign BD response, Diffusing capacity  Decreased 57% Has not smoked.  Wants to return to work soon.  CXR shows persistent RUL/LLL cavitary lesions .  Says she is feeling so much better.  No fever, chest pain, orthopnea, edema or n/v/d.   Good appetite . Seen by cards for tachycardia felt secondary to recent PN   OV 05/01/2014  Chief Complaint  Patient presents with  . Follow-up    Pt states her breathing has improved since last OV. Pt c/o prod cough with yellow and clear mucous and lower mid lumbar pain, pt thinks d/t RA. Pt doing duoneb only once a day.  Pt denies SOB.     Admitted 02/16/2014 through 02/24/2014 with right-sided pneumococcal pneumonia with cavitation in the setting of smoking and rheumatoid arthritis on immune modulators. Current followup is for the same   S: Presents with husband. Feels great overall. No fatigute. No dyspnea. Able to do all ADLs. Wants to return to work now and is asking for release to work. In terms of RA, she feels early morning stiffness of hand joint is returning. SHe is seeing Dr Dierdre Forth 05/01/14 and will discuss with him about return to immunomodulators. There are no other issues. CXR done after she left and at time of this note:    Dg Chest 2 View  04/30/2014   CLINICAL DATA:  Followup cavitary pneumonia  EXAM: CHEST  2 VIEW  COMPARISON:  04/02/2014  FINDINGS: Normal heart size, mediastinal contours, and pulmonary vascularity.  Patchy infiltrates are identified in RIGHT upper lobe and LEFT lower lobe consistent with pneumonia.  Cavitary foci identified previously in the in both the RIGHT upper and LEFT lower  lobes are still seen, the less evident in the RIGHT upper lobe.  Scattered interstitial prominence again identified.  No gross pleural effusion or pneumothorax.  Bones unremarkable.  IMPRESSION: Persistent infiltrates are identified in the RIGHT upper lobe and LEFT lower lobe with persistent visualization of cavitary foci.   Electronically Signed   By: Ulyses Southward M.D.   On: 04/30/2014 19:09      OV 08/09/2014  Chief Complaint  Patient presents with  . Follow-up    Pt here after CT scan. Pt denies change in breathing since last OV. Pt c/o prod cough with clear and green mucus.      Presents for Pneumococcal pneumonia followup that developed in setting of RA and Simponi  She is being maintained on ARAVA since her pneumococcal pneumonia earlier in 2013.  Now she is free of symptoms of respiratory tree. CT 08/08/14 shows further improvement but has architectural distortion. HEr RA is now significantly  active - per her and my talking 08/09/2014 with Dr Dierdre Forth. Patient is very apprehensive of starting ORENCIA recommended by Dr Dierdre Forth due to increased aecopd risk (She does not have copd), recurrent infection esp in setting of focal archiectrual distortion and recent pneumonia. She is very scared of dealing with RA disability v infection risk   Social and past and family: Mom had RA. She is upset abotu her disease  CT 08/08/14  IMPRESSION: 1. Emphysema with scattered scarring, volume loss, scattered bulla (some of which are still inflamed and with a somewhat cavitary appearance), and bilateral airway thickening. Much of this represents residua from prior multi lobar cavitary pneumonia, and is improved compared to the prior July CT scan. Given the degree of architectural distortion, scattered nodularity and opacities, and and irregular scarring, CT is not able to clear the lung of the possibility of malignancy, and surveillance imaging may be warranted.   Electronically Signed  By: [name]  On: 08/08/2014 15:32  OV 11/06/2014  Chief Complaint  Patient presents with  . Follow-up    Coughing up whitish colored phlegm, SOB with walking, not always, sometimes.      Presents for Pneumococcal pneumonia followup that developed in setting of RA and Simponi June 2015, with resultant architectural distortion in her lung anatomy  Last seen December 2015. Since then overall she's doing well. At baseline she only has mild cough with occasional white sputum. She also has exertional dyspnea when she climbs up a hill but clearly in cold air he did this is  relieved by rest. She is currently on 2 immunomodulators for her rheumatoid arthritis Orencia and Arava and this is helping her joints. She's not had any respiratory complications so far. I notice that she is not on Bactrim prophylaxis but she is concerned about the side effects. And she wants to talk to her rheumatologist Dr. Dierdre Forth about this  Past, Family, Social reviewed: no change since last visit   OV 02/10/2015  Chief Complaint  Patient presents with  . Follow-up    Pt stated her breathing feels it has improved since last OV. Pt here after PFT. Pt stated she has a little more trouble breathing when it is hot out. Pt c/o mild prod cough with clear and small light green mucus. Pt denies CP/tightness.    Follow-up chronic destructive pulmonary to changes following pneumococcal pneumonia in 2015 in the setting of previous heavy smoking and immunomodulatory treatment for rheumatoid arthritis   Since seeing me last in March 2016 she feels that  the albuterol inhaler that I tried for dyspnea and exertion and wheezing has helped. Dyspnea still persists. It is unchanged. It is mild. However it is improved with albuterol which she takes prior to exertion. There is no associated cough or. Wheezing is improved. No hemoptysis no fever no chills .Outside chart from Dr. Dierdre Forth her rheumatologist dated 02/04/2015 reviewed. She reported to him that she is compliant on Ornecia and arava with good relief and joint pain. Her dyspnea is mild chronic and stable. His advice for her to continue the same. He suggested that I look into her chronic residual dyspnea at this visit   Last pulmonary imaging with CT scan of the chest December 2015:residual changes + along with emphysema; report reviewed. Image not visualized  Pulmonary function test 02/10/2015 today personally reviewed image: FEV1 prebronchodilator 1.7 L/64%, ratio 69.  FEV1 postbronchodilator is 1.9 L/72% and is a 12% positive response with  broncho-dilator., FVC 2.4 L/72% and ratio of 79. Total lung capacity is 3.76 L/76%. DLCO is 18.5/80%. PFTs are consistent with restriction and a normal DLCO but in the prebronchodilator she has mild obstruction   OV 08/11/2015  Chief Complaint  Patient presents with  . Follow-up    Pt states her breathing has improved since last OV. Pt states she has only been using the spiriva as needed. Pt c/o prod cough with clear mucus and DOE with heavy exertion. pt denies CP/tightness.     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 She continues to do well. She is now on leflunomide and Orencia for her rheumatoid arthritis. With this rheumatoid arthritis symptoms have improved. She is follows up with Dr. Dierdre Forth in his new rheumatology practice [facility]. She feels that her wheezing has significantly improved. Shortness of breath significantly improved. She is not taking Spiriva anymore. She reported some hoarseness with it. She wants to continue to watch herself without Spiriva. She is up-to-date with her flu shot.   OV 02/09/2016  Chief Complaint  Patient presents with  . Follow-up    Pt reports that her breathing is doing well. Pt c/o occasional dry cough that is worse when using a vape for recreational use. Pt also c/o occasional wheeze with hot, humid weather and exertion. Pt does use albuterol HFA and her symptoms improve. Pt denies SOB/CP/tightness.        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 Doing well but has started smoking again though is nicotine-free vape smoking now. She did not want spiriva due to dpi and  throat irritation. But she is using albuterol atleast daily. She is having exertional wheeze but otherwise okayt. Dub Amis continues with Dr Dierdre Forth. She tells me she never had shingles vaccine. Otherwise well. Working at [employer] . Does not want cxr 02/09/2016 for fu. Wants to defer till next visit   OV 12/08/2017  Chief Complaint  Patient presents with  . Follow-up    Last seen 02/09/16.  Pt states she has been doing well since last visit and denies any complaints.      Follow-up chronic destructive pulmonary to changes following pneumococcal pneumonia in 2015 in the setting of previous heavy smoking and immunomodulatory treatment for rheumatoid arthritis   Personally  not seen Terah Giaimo in almost 2 years.  She went through a separation and divorce and this time.  Smoking continues to be in remission.  At last visit she was smoking but now is in remission.  She continues to work at the [employer].  For her rheumatoid arthritis she has been switched out of orcencia to [place] and this is working well for her.  She only has some mild pain.  In terms of her respiratory symptoms she hardly has any symptoms.  Definitely no cough.  She has mild dyspnea on exertion when working out in the yard but this is stable.  Review of the chart shows that her last echocardiogram was in 2015.  Her last chest x-ray or imaging was in 2016 and so was a pulmonary function test.  She wants to make sure her lungs are doing okay at this point.   Results for TARAANN, BOGUSZ (MRN [id number]) as of 12/08/2017 11:09  Ref. Range 04/02/2014 11:42 02/10/2015 13:49  FEV1-Pre Latest Units: L 1.62 1.68  FEV1-%Pred-Pre Latest Units: % 61 64  Results for CHAUNTIA, RICO (MRN [id number]) as of 12/08/2017 11:09  Ref. Range 04/02/2014 11:42 02/10/2015 13:49  Pre FEV1/FVC ratio Latest Units: % 87 69     has a past medical history of Abnormal Pap smear (09/2006), CHF (congestive heart failure) (HCC), Foot fracture, left (10/16),  Pneumonia, and RA (rheumatoid arthritis) (HCC) (07/2009).   reports that she quit smoking about 3 years ago. Her smoking use included cigarettes. She has a 30.00 pack-year smoking history. She has never used smokeless tobacco.  Past Surgical History:  Procedure Laterality Date  . CERVICAL BIOPSY  W/ LOOP ELECTRODE EXCISION  2008   CIN 3  . CERVICAL CONIZATION W/BX N/A 12/15/2015   Procedure: CONIZATION CERVIX WITH BIOPSY ;  Surgeon: Jerene Bears, MD;  Location: [facility];  Service: Gynecology;  Laterality: N/A;  POSSIBLE COLD KNIFE CONE.  Please have the microscope in the room  . COLONOSCOPY    . LEEP N/A 12/15/2015   Procedure: LOOP ELECTROSURGICAL EXCISION PROCEDURE (LEEP) with colpo;  Surgeon: Jerene Bears, MD;  Location: [facility];  Service: Gynecology;  Laterality: N/A;  . PILONIDAL CYST EXCISION  1999   I&D  . WISDOM TOOTH EXTRACTION      Allergies  Allergen Reactions  . Bee Venom Anaphylaxis  . Codeine Other (See Comments)    "seeing spots"  . Penicillins Rash    Has patient had a PCN reaction causing immediate rash, facial/tongue/throat swelling, SOB or lightheadedness with hypotension: No Has patient had a PCN reaction causing severe rash involving mucus membranes or skin necrosis: No Has patient had a PCN reaction that required hospitalization No Has patient had a PCN reaction occurring within the last 10 years: Yes If all of the above answers are "NO", then may proceed with Cephalosporin use.     Immunization History  Administered Date(s) Administered  . Influenza,inj,Quad PF,6+ Mos 06/25/2015, 05/31/2017  . Influenza-Unspecified 06/06/2014  . Pneumococcal Polysaccharide-23 02/18/2014  . Tdap 09/01/2016    Family History  Problem Relation Age of Onset  . Diabetes Mother   . Rheum arthritis Mother   . Diabetes Father   . COPD Father   . Bronchitis Father   . Cancer Brother        Lung  . Breast cancer Maternal Aunt 22  . CAD Neg Hx      Current Outpatient  Medications:  .  albuterol (PROAIR HFA) 108 (  90 Base) MCG/ACT inhaler, Inhale 1-2 puffs into the lungs every 6 (six) hours as needed for wheezing or shortness of breath. Must keep ov for future refills., Disp: 8.5 g, Rfl: 0 .  Diclofenac Sodium-Capsaicin (INFLAMMACIN CO), , Disp: , Rfl:  .  escitalopram (LEXAPRO) 10 MG tablet, TAKE 1 TABLET BY MOUTH EVERY DAY, Disp: 90 tablet, Rfl: 0 .  XELJANZ XR 11 MG TB24, , Disp: , Rfl: 0     Review of Systems     Objective:   Physical Exam  Constitutional: She is oriented to person, place, and time. She appears well-developed and well-nourished. No distress.  HENT:  Head: Normocephalic and atraumatic.  Right Ear: External ear normal.  Left Ear: External ear normal.  Mouth/Throat: Oropharynx is clear and moist. No oropharyngeal exudate.  Eyes: Pupils are equal, round, and reactive to light. Conjunctivae and EOM are normal. Right eye exhibits no discharge. Left eye exhibits no discharge. No scleral icterus.  Neck: Normal range of motion. Neck supple. No JVD present. No tracheal deviation present. No thyromegaly present.  Cardiovascular: Normal rate, regular rhythm and intact distal pulses. Exam reveals no gallop and no friction rub.  Murmur heard. New systolic murmur  Pulmonary/Chest: Effort normal and breath sounds normal. No respiratory distress. She has no wheezes. She has no rales. She exhibits no tenderness.  Scattered fine crackles at base  Abdominal: Soft. Bowel sounds are normal. She exhibits no distension and no mass. There is no tenderness. There is no rebound and no guarding.  Musculoskeletal: Normal range of motion. She exhibits no edema or tenderness.  Lymphadenopathy:    She has no cervical adenopathy.  Neurological: She is alert and oriented to person, place, and time. She has normal reflexes. No cranial nerve deficit. She exhibits normal muscle tone. Coordination normal.  Skin: Skin is warm and dry. No rash noted. She is not  diaphoretic. No erythema. No pallor.  Psychiatric: She has a normal mood and affect. Her behavior is normal. Judgment and thought content normal.  Vitals reviewed.  Vitals:   12/08/17 1055  BP: 110/82  Pulse: 81  SpO2: 97%  Weight: 148 lb 6.4 oz (67.3 kg)  Height: 5' 2.75" (1.594 m)    Estimated body mass index is 26.5 kg/m as calculated from the following:   Height as of this encounter: 5' 2.75" (1.594 m).   Weight as of this encounter: 148 lb 6.4 oz (67.3 kg).       Assessment:       ICD-10-CM   1. Cardiac murmur R01.1   2. History of pneumonia Z87.01   3. History of smoking Z87.891        Plan:     Cardiac murmur - new finding   - do Echo  History of pneumonia History of smoking  - do cxr 2 view  - Pre-bd spiro and dlco only. No lung volume or bd response. No post-bd spiro   Followup  next few weeks with an APP to review above result    Dr. Kalman Shan, M.D., [facility].C.P Pulmonary and Critical Care Medicine Staff Physician, [facility] Center Director - Interstitial Lung Disease  Program  Pulmonary Fibrosis [facility] at [facility] [place], [place], [address]  Pager: [phone], If no answer or between  15:00h - 7:00h: call [phone] Telephone: [phone]

## 2017-12-08 NOTE — Patient Instructions (Signed)
Cardiac murmur - new finding   - do Echo  History of pneumonia History of smoking  - do cxr 2 view  - Pre-bd spiro and dlco only. No lung volume or bd response. No post-bd spiro   Followup  next few weeks with an APP to review above result

## 2017-12-09 ENCOUNTER — Telehealth: Payer: Self-pay | Admitting: Internal Medicine

## 2017-12-09 DIAGNOSIS — R911 Solitary pulmonary nodule: Secondary | ICD-10-CM

## 2017-12-09 NOTE — Telephone Encounter (Signed)
Kimberly Shelton   Let. Kimberly Shelton know that something new might be developing ion left upper lobe lung  On CXXR  Plan bmet -> Ct chest with contrast    Dg Chest 2 View  Result Date: 12/08/2017 CLINICAL DATA:  Smoking history, history of pneumonia, follow-up EXAM: CHEST - 2 VIEW COMPARISON:  Chest x-ray of 02/10/2015 and 04/30/2014 FINDINGS: Opacities in the left mid lung and right upper lung field have been present for several years and represent scarring. There is a new nodular opacity within left upper lobe which was vaguely present on 02/10/2015. This also most likely represents and area of scarring but either continued follow-up or CT the chest is recommended. Fibrotic changes present at the right lung base. No definite pneumonia or pleural effusion is seen. Somewhat prominent superior mediastinal soft tissues could be due to ectatic great vessels or possibly prominent thyroid but clinical correlation is recommended. No acute bony abnormality is seen. IMPRESSION: 1. Primarily foci of scarring in the right upper lung and left mid lung which has been present for several years. 2. Developing nodular opacity in left upper lobe may also represent an area of scarring but either continued close follow-up or CT of the chest with IV contrast would be recommended to evaluate further. 3. Somewhat prominent superior mediastinal soft tissues may be due to ectatic vessels but prominent thyroid cannot be excluded. Correlate clinically and again CT of the chest with IV contrast media would be beneficial in assessing this area as well. Electronically Signed   By: Dwyane Dee M.D.   On: 12/08/2017 17:00   Mm Screening Breast Tomo Bilateral  Result Date: 12/08/2017 CLINICAL DATA:  Screening. EXAM: DIGITAL SCREENING BILATERAL MAMMOGRAM WITH TOMO AND CAD COMPARISON:  Previous exam(s). ACR Breast Density Category c: The breast tissue is heterogeneously dense, which may obscure small masses. FINDINGS: There are no findings  suspicious for malignancy. Images were processed with CAD. IMPRESSION: No mammographic evidence of malignancy. A result letter of this screening mammogram will be mailed directly to the patient. RECOMMENDATION: Screening mammogram in one year. (Code:[id number]) BI-RADS CATEGORY  1: Negative. Electronically Signed   By: Britta Mccreedy M.D.   On: 12/08/2017 08:53

## 2017-12-12 ENCOUNTER — Ambulatory Visit (HOSPITAL_COMMUNITY): Payer: BLUE CROSS/BLUE SHIELD | Attending: Cardiology

## 2017-12-12 ENCOUNTER — Other Ambulatory Visit: Payer: Self-pay

## 2017-12-12 DIAGNOSIS — R0600 Dyspnea, unspecified: Secondary | ICD-10-CM | POA: Diagnosis not present

## 2017-12-12 DIAGNOSIS — I253 Aneurysm of heart: Secondary | ICD-10-CM | POA: Diagnosis not present

## 2017-12-12 DIAGNOSIS — R011 Cardiac murmur, unspecified: Secondary | ICD-10-CM | POA: Diagnosis not present

## 2017-12-12 NOTE — Telephone Encounter (Signed)
Called patient and let her know the results of her chest xray.  Orders for CT and BMet were placed.  No further needs or questions as this time

## 2017-12-13 ENCOUNTER — Encounter: Payer: Self-pay | Admitting: Obstetrics & Gynecology

## 2017-12-13 ENCOUNTER — Other Ambulatory Visit (INDEPENDENT_AMBULATORY_CARE_PROVIDER_SITE_OTHER): Payer: BLUE CROSS/BLUE SHIELD

## 2017-12-13 ENCOUNTER — Ambulatory Visit: Payer: BLUE CROSS/BLUE SHIELD | Admitting: Obstetrics & Gynecology

## 2017-12-13 DIAGNOSIS — R87610 Atypical squamous cells of undetermined significance on cytologic smear of cervix (ASC-US): Secondary | ICD-10-CM

## 2017-12-13 DIAGNOSIS — R911 Solitary pulmonary nodule: Secondary | ICD-10-CM

## 2017-12-13 LAB — BASIC METABOLIC PANEL
BUN: 8 mg/dL (ref 6–23)
CALCIUM: 9.2 mg/dL (ref 8.4–10.5)
CO2: 29 mEq/L (ref 19–32)
CREATININE: 0.66 mg/dL (ref 0.40–1.20)
Chloride: 103 mEq/L (ref 96–112)
GFR: 97.73 mL/min (ref >60.00)
Glucose, Bld: 111 mg/dL — ABNORMAL HIGH (ref 70–99)
Potassium: 4.2 mEq/L (ref 3.5–5.1)
Sodium: 139 mEq/L (ref 135–145)

## 2017-12-13 NOTE — Patient Instructions (Signed)

## 2017-12-13 NOTE — Progress Notes (Signed)
57 y.o. G76P1 Divorced female here for colposcopy with possible biopsies and/or ECC due to ASCUS pap with neg HR HPV obtained at [facility] on 11/24/17.    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  Follow up pap at six months was negative and at 12 months was negative with neg HR HPV.  Patient's last menstrual period was 09/06/2004.          Sexually active: No.  The current method of family planning is post menopausal status.     Patient has been counseled about results and procedure.  Risks and benefits have bene reviewed including immediate and/or delayed bleeding, infection, cervical scaring from procedure, possibility of needing additional follow up as well as treatment.  rare risks of missing a lesion discussed as well.  All questions answered.  Pt ready to proceed.  BP 132/76 (BP Location: Left Arm, Patient Position: Sitting, Cuff Size: Normal)   Pulse (!) 104   Resp 16   Ht 5' 2.75" (1.594 m)   Wt 147 lb (66.7 kg)   LMP 09/06/2004   BMI 26.25 kg/m   Physical Exam  Constitutional: She is oriented to person, place, and time. She appears well-developed and well-nourished.  Genitourinary:    Neurological: She is alert and oriented to person, place, and time.  Skin: Skin is warm and dry.  Psychiatric: She has a normal mood and affect.    Speculum placed.  3% acetic acid applied to cervix for >45 seconds.  Cervix visualized with both 7.5X and 15X magnification.  Green filter also used.  Lugols solution was not used.  Findings:  No AWE or abnormal staining.  Entire upper 1/3 of vagina was also stained with Lugol's solution and no abnormal staining was noted.  Biopsy:  not indicated.  ECC:  was performed.  Monsel's was not needed.  Excellent hemostasis was present.  Pt tolerated procedure well and all instruments were removed.  Findings noted above on picture of cervix.  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  If negative, will plan repeat pap and HR HPV 1 year.  Will pre-treat with estrogen about 1 month before pap smear.  Reminder placed to send in rx in Feb, 2020.

## 2017-12-14 ENCOUNTER — Telehealth: Payer: Self-pay | Admitting: Internal Medicine

## 2017-12-14 ENCOUNTER — Telehealth: Payer: Self-pay | Admitting: Physician Assistant

## 2017-12-14 NOTE — Telephone Encounter (Signed)
Called and spoke with patient, she is aware of results and verbalized understanding. Patient states that she will schedule an appointment with her cardiologist to go over this.

## 2017-12-14 NOTE — Telephone Encounter (Signed)
Spoke with pt. She is requesting the results from her Echocardiogram.  Dr. [name] - please advise. Thanks.

## 2017-12-14 NOTE — Telephone Encounter (Signed)
Close encounter 

## 2017-12-14 NOTE — Telephone Encounter (Signed)
Strong heart. No clear reasopn for murmur. However, in the wall between the 2 upper chambers of the heart (called atrium) the wall seems soft and a bit ballooned up.Not sure what this means esp because she is not needing oxygen. Recommend cardiology referral  Dr. Kalman Shan, M.D., [facility].C.P Pulmonary and Critical Care Medicine Staff Physician, [facility] Center Director - Interstitial Lung Disease  Program  Pulmonary Fibrosis [facility] [place], [place], [address]  Pager: [phone], If no answer or between  15:00h - 7:00h: call [phone] Telephone: [phone]

## 2017-12-19 ENCOUNTER — Telehealth: Payer: Self-pay | Admitting: Internal Medicine

## 2017-12-19 NOTE — Telephone Encounter (Signed)
Spoke with pt. She is requesting her lab work results from 12/13/17. Advised her that Dr. [name] is out of the office until next week. Pt is okay with waiting until he returns.  Dr. [name] - please advise. Thanks.

## 2017-12-20 ENCOUNTER — Ambulatory Visit (INDEPENDENT_AMBULATORY_CARE_PROVIDER_SITE_OTHER)
Admission: RE | Admit: 2017-12-20 | Discharge: 2017-12-20 | Disposition: A | Discharge status: Home / Self Care | Payer: BLUE CROSS/BLUE SHIELD | Source: Ambulatory Visit | Attending: Internal Medicine | Admitting: Internal Medicine

## 2017-12-20 DIAGNOSIS — R911 Solitary pulmonary nodule: Secondary | ICD-10-CM

## 2017-12-20 DIAGNOSIS — R918 Other nonspecific abnormal finding of lung field: Secondary | ICD-10-CM | POA: Diagnosis not present

## 2017-12-20 MED ORDER — IOPAMIDOL (ISOVUE-300) INJECTION 61%
80.0000 mL | Freq: Once | INTRAVENOUS | Status: AC | PRN
  Administered 2017-12-20: 80 mL via INTRAVENOUS

## 2017-12-26 NOTE — Telephone Encounter (Signed)
Let aptient know that the blood work on 12/13/17 was done so that we can make sure kidney was ok ahead of the ct scan - the kidney was indeed fine  Regarding CT chtst 12/20/17  1. Several findings best discusssed fact to face during visit mid may 2019 but In briref  2. There is still bronchiectasis - old Right upper lobe scarring resolved. There is new scarring in the left lung for which all we can do is keep an eye  3. Asymptomatic gallstone  4.  does have coronary artery calcification and if no normal cardiac stress test past few years;  She already is seeing cardiologist for murmur and should discuss thais   IMPRESSION: 1. Widespread areas of bronchiectasis and scarring, generally similar to the prior examination. Two of the thick-walled cavitary areas in the right upper lobe seen on the prior study have resolved, however, there is a new area of scarring in the right upper lobe near the apex which contains some internal soft tissue which is favored to represent some sloughed necrotic lung tissue, although an aspergilloma is not entirely excluded. Close attention on future follow-up imaging is recommended. 2. Possible honeycombing in the extreme lung bases which could indicate early interstitial lung disease. Future follow-up examination should be performed is high-resolution chest CTs to better evaluate these findings. 3. Cholelithiasis. 4. Aortic atherosclerosis, in addition to 2 vessel coronary artery disease. Please note that although the presence of coronary artery calcium documents the presence of coronary artery disease, the severity of this disease and any potential stenosis cannot be assessed on this non-gated CT examination. Assessment for potential risk factor modification, dietary therapy or pharmacologic therapy may be warranted, if clinically indicated.  Aortic Atherosclerosis (ICD10-I70.0).   Electronically Signed   By: Trudie Reed M.D.   On: 12/21/2017  13:00

## 2017-12-26 NOTE — Telephone Encounter (Signed)
Spoke with pt. She is aware of results. Nothing further was needed.  

## 2017-12-28 DIAGNOSIS — I253 Aneurysm of heart: Secondary | ICD-10-CM | POA: Insufficient documentation

## 2017-12-28 NOTE — Progress Notes (Signed)
Cardiology Office Note    Date:  12/29/2017   ID:  Kimberly Shelton, Kimberly Shelton 1960/12/04, MRN [id number]  PCP:  Kimberly Covey, MD  Cardiologist: Kimberly Carrow, MD  Chief Complaint  Patient presents with  . Heart Murmur    History of Present Illness:  Kimberly Shelton is a 57 y.o. female who was seen by Dr. Clifton Shelton in 2016 for evaluation of tachycardia.  Echo at that time normal LV function with no evidence of pericardial effusion.  Was felt her tachycardia was in response to her acute illness of pneumococcal pneumonia with cavitation in the setting of smoking and rheumatoid arthritis on immune modulators.  Tachycardia resolved after treatment of pneumonia.  She is referred back by Dr. Marchelle Shelton because of recent echo findings of atrial septal aneurysm.  2D echo 12/12/17 normal LVEF 65-70% with grade 1 DD and atrial septal aneurysm.  Patient comes in today accompanied by her sister for echo f/u. Walks about 1000 yds/day, but is active all day long. Limited b/c of RA. Sometimes gets short of breath working in the yard  relieved with an inhaler.Quit smoking but is vaping some.  Also goes to tanning bed because it helps her RA feel better.  Denies any chest pain, tachycardia, dyspnea, edema or cardiac symptoms.    Past Medical History:  Diagnosis Date  . Abnormal Pap smear 09/2006   HGSIL CIN 2/VAIN/ CIN 3/VAIN-3 CIS  . CHF (congestive heart failure) (HCC)   . Foot fracture, left 10/16   hair-line fracture  . Pneumonia    was hospitalized  . RA (rheumatoid arthritis) (HCC) 07/2009    Past Surgical History:  Procedure Laterality Date  . CERVICAL BIOPSY  W/ LOOP ELECTRODE EXCISION  2008   CIN 3  . CERVICAL CONIZATION W/BX N/A 12/15/2015   Procedure: CONIZATION CERVIX WITH BIOPSY ;  Surgeon: Jerene Bears, MD;  Location: [facility];  Service: Gynecology;  Laterality: N/A;  POSSIBLE COLD KNIFE CONE.  Please have the microscope in the room  . COLONOSCOPY    . LEEP N/A  12/15/2015   Procedure: LOOP ELECTROSURGICAL EXCISION PROCEDURE (LEEP) with colpo;  Surgeon: Jerene Bears, MD;  Location: [facility];  Service: Gynecology;  Laterality: N/A;  . PILONIDAL CYST EXCISION  1999   I&D  . WISDOM TOOTH EXTRACTION      Current Medications: Current Meds  Medication Sig  . albuterol (PROVENTIL HFA;VENTOLIN HFA) 108 (90 Base) MCG/ACT inhaler Inhale 1-2 puffs into the lungs every 6 (six) hours as needed for wheezing or shortness of breath.  . escitalopram (LEXAPRO) 10 MG tablet TAKE 1 TABLET BY MOUTH EVERY DAY  . XELJANZ XR 11 MG TB24 Take 1 tablet by mouth daily.   . [DISCONTINUED] albuterol (PROAIR HFA) 108 (90 Base) MCG/ACT inhaler Inhale 1-2 puffs into the lungs every 6 (six) hours as needed for wheezing or shortness of breath. Must keep ov for future refills. (Patient taking differently: Inhale 1-2 puffs into the lungs every 6 (six) hours as needed for wheezing or shortness of breath. )     Allergies:   Bee venom; Codeine; and Penicillins   Social History   Socioeconomic History  . Marital status: Divorced    Spouse name: Not on file  . Number of children: 1  . Years of education: Not on file  . Highest education level: Not on file  Occupational History  . Occupation: Stage manager: [employer]  Social Needs  .  Financial resource strain: Not on file  . Food insecurity:    Worry: Not on file    Inability: Not on file  . Transportation needs:    Medical: Not on file    Non-medical: Not on file  Tobacco Use  . Smoking status: Former Smoker    Packs/day: 1.00    Years: 30.00    Pack years: 30.00    Types: Cigarettes, E-cigarettes    Last attempt to quit: 02/16/2014    Years since quitting: 3.8  . Smokeless tobacco: Never Used  Substance and Sexual Activity  . Alcohol use: No    Alcohol/week: 0.0 oz  . Drug use: No  . Sexual activity: Not Currently    Partners: Male    Birth control/protection: Post-menopausal  Lifestyle  .  Physical activity:    Days per week: Not on file    Minutes per session: Not on file  . Stress: Not on file  Relationships  . Social connections:    Talks on phone: Not on file    Gets together: Not on file    Attends religious service: Not on file    Active member of club or organization: Not on file    Attends meetings of clubs or organizations: Not on file    Relationship status: Not on file  Other Topics Concern  . Not on file  Social History Narrative  . Not on file     Family History:  The patient's family history includes Breast cancer (age of onset: 63) in her maternal aunt; Bronchitis in her father; COPD in her father; Cancer in her brother; Diabetes in her father and mother; Rheum arthritis in her mother.   ROS:   Please see the history of present illness.    Review of Systems  Constitution: Negative.  HENT: Negative.   Eyes: Negative.   Cardiovascular: Negative.   Respiratory: Negative.   Hematologic/Lymphatic: Negative.   Musculoskeletal: Negative.  Negative for joint pain.  Gastrointestinal: Negative.   Genitourinary: Negative.   Neurological: Negative.    All other systems reviewed and are negative.   PHYSICAL EXAM:   VS:  BP 136/72   Pulse 81   Ht 5\' 4"  (1.626 m)   Wt 146 lb (66.2 kg)   LMP 09/06/2004   BMI 25.06 kg/m   Physical Exam  GEN: Well nourished, well developed, in no acute distress  Neck: no JVD, carotid bruits, or masses Cardiac:RRR;1- 2/6 systolic murmur the left sternal border Respiratory:  clear to auscultation bilaterally, normal work of breathing GI: soft, nontender, nondistended, + BS Ext: without cyanosis, clubbing, or edema, Good distal pulses bilaterally Neuro:  Alert and Oriented x 3 Psych: euthymic mood, full affect  Wt Readings from Last 3 Encounters:  12/29/17 146 lb (66.2 kg)  12/13/17 147 lb (66.7 kg)  12/08/17 148 lb 6.4 oz (67.3 kg)      Studies/Labs Reviewed:   EKG:  EKG is  ordered today.  The ekg ordered  today demonstrates normal sinus rhythm nonspecific ST-T wave changes, no acute change  Recent Labs: 12/13/2017: BUN 8; Creatinine, Ser 0.66; Potassium 4.2; Sodium 139   Lipid Panel    Component Value Date/Time   CHOL 188 09/01/2016 0743   TRIG 113.0 09/01/2016 0743   HDL 77.40 09/01/2016 0743   CHOLHDL 2 09/01/2016 0743   VLDL 22.6 09/01/2016 0743   LDLCALC 88 09/01/2016 0743    Additional studies/ records that were reviewed today include:  Chest CTIMPRESSION: 12/2017 1.  Widespread areas of bronchiectasis and scarring, generally similar to the prior examination. Two of the thick-walled cavitary areas in the right upper lobe seen on the prior study have resolved, however, there is a new area of scarring in the right upper lobe near the apex which contains some internal soft tissue which is favored to represent some sloughed necrotic lung tissue, although an aspergilloma is not entirely excluded. Close attention on future follow-up imaging is recommended. 2. Possible honeycombing in the extreme lung bases which could indicate early interstitial lung disease. Future follow-up examination should be performed is high-resolution chest CTs to better evaluate these findings. 3. Cholelithiasis. 4. Aortic atherosclerosis, in addition to 2 vessel coronary artery disease. Please note that although the presence of coronary artery calcium documents the presence of coronary artery disease, the severity of this disease and any potential stenosis cannot be assessed on this non-gated CT examination. Assessment for potential risk factor modification, dietary therapy or pharmacologic therapy may be warranted, if clinically indicated.   Aortic Atherosclerosis (ICD10-I70.0).     Electronically Signed   By: Trudie Reed M.D.   On: 12/21/2017 13:00  2D echo 4/2019Study Conclusions   - Left ventricle: The cavity size was normal. Wall thickness was   normal. Systolic function was vigorous.  The estimated ejection   fraction was in the range of 65% to 70%. Wall motion was normal;   there were no regional wall motion abnormalities. Doppler   parameters are consistent with abnormal left ventricular   relaxation (grade 1 diastolic dysfunction). - Atrial septum: There was an atrial septal aneurysm.   Impressions:   - Vigorous LV systolic function, mild diastolic dysfunction.      ASSESSMENT:    1. Chronic diastolic heart failure (HCC)   2. Atrial septal aneurysm   3. Aortic atherosclerosis (HCC)   4. History of smoking      PLAN:  In order of problems listed above:   Atrial septal aneurysm found incidentally on 2D echo done for heart murmur.  Trivial TR other valves normal.  Reviewed findings with Dr. Elease Hashimoto who says she is probably had it all her life and it is an incidental finding.  No regular follow-up for this required.  Chronic diastolic CHF grade 1 DD on echo.  No trouble with heart failure.  History of tachycardia in the setting of acute illness resolved and no recurrence.  Aortic atherosclerosis and two-vessel CAD found on recent pulmonary CT.  Patient is asymptomatic.  Follow-up with Dr. Clifton Shelton next year.  Quit smoking cigarettes in 2015 but is now vaping.  Discussed importance of quitting completely.  Medication Adjustments/Labs and Tests Ordered: Current medicines are reviewed at length with the patient today.  Concerns regarding medicines are outlined above.  Medication changes, Labs and Tests ordered today are listed in the Patient Instructions below. Patient Instructions  Medication Instructions:  Your physician recommends that you continue on your current medications as directed. Please refer to the Current Medication list given to you today.   Labwork: None ordered  Testing/Procedures: None ordered  Follow-Up: Your physician wants you to follow-up AS NEEDED with Dr. Clifton Shelton   Any Other Special Instructions Will Be Listed Below (If  Applicable).     If you need a refill on your cardiac medications before your next appointment, please call your pharmacy.      Elson Clan, PA-C  12/29/2017 11:36 AM    [facility] [address] [place], [place], [place]  [address]  Phone: [phone]; Fax: [phone]

## 2017-12-29 ENCOUNTER — Encounter: Payer: Self-pay | Admitting: Physician Assistant

## 2017-12-29 ENCOUNTER — Ambulatory Visit: Payer: BLUE CROSS/BLUE SHIELD | Admitting: Physician Assistant

## 2017-12-29 VITALS — BP 136/72 | HR 81 | Ht 64.0 in | Wt 146.0 lb

## 2017-12-29 DIAGNOSIS — Z87891 Personal history of nicotine dependence: Secondary | ICD-10-CM

## 2017-12-29 DIAGNOSIS — I253 Aneurysm of heart: Secondary | ICD-10-CM | POA: Diagnosis not present

## 2017-12-29 DIAGNOSIS — I5032 Chronic diastolic (congestive) heart failure: Secondary | ICD-10-CM | POA: Diagnosis not present

## 2017-12-29 DIAGNOSIS — I4719 Other supraventricular tachycardia: Secondary | ICD-10-CM | POA: Insufficient documentation

## 2017-12-29 DIAGNOSIS — I700 Atherosclerosis of aorta: Secondary | ICD-10-CM

## 2017-12-29 DIAGNOSIS — I471 Supraventricular tachycardia: Secondary | ICD-10-CM | POA: Diagnosis not present

## 2017-12-29 NOTE — Patient Instructions (Signed)
Medication Instructions:  Your physician recommends that you continue on your current medications as directed. Please refer to the Current Medication list given to you today.   Labwork: None ordered  Testing/Procedures: None ordered  Follow-Up: Your physician wants you to follow-up AS NEEDED with Dr. Clifton James   Any Other Special Instructions Will Be Listed Below (If Applicable).     If you need a refill on your cardiac medications before your next appointment, please call your pharmacy.

## 2018-01-02 DIAGNOSIS — M0589 Other rheumatoid arthritis with rheumatoid factor of multiple sites: Secondary | ICD-10-CM | POA: Diagnosis not present

## 2018-01-02 DIAGNOSIS — Z79899 Other long term (current) drug therapy: Secondary | ICD-10-CM | POA: Diagnosis not present

## 2018-01-02 DIAGNOSIS — G5602 Carpal tunnel syndrome, left upper limb: Secondary | ICD-10-CM | POA: Diagnosis not present

## 2018-01-16 DIAGNOSIS — M0589 Other rheumatoid arthritis with rheumatoid factor of multiple sites: Secondary | ICD-10-CM | POA: Diagnosis not present

## 2018-01-19 ENCOUNTER — Ambulatory Visit: Payer: BLUE CROSS/BLUE SHIELD | Admitting: Internal Medicine

## 2018-01-27 ENCOUNTER — Ambulatory Visit: Payer: BLUE CROSS/BLUE SHIELD | Admitting: Internal Medicine

## 2018-01-27 ENCOUNTER — Ambulatory Visit (INDEPENDENT_AMBULATORY_CARE_PROVIDER_SITE_OTHER): Payer: BLUE CROSS/BLUE SHIELD | Admitting: Internal Medicine

## 2018-01-27 ENCOUNTER — Encounter: Payer: Self-pay | Admitting: Internal Medicine

## 2018-01-27 ENCOUNTER — Other Ambulatory Visit: Payer: BLUE CROSS/BLUE SHIELD

## 2018-01-27 VITALS — BP 110/78 | HR 97 | Ht 63.0 in | Wt 144.0 lb

## 2018-01-27 DIAGNOSIS — J479 Bronchiectasis, uncomplicated: Secondary | ICD-10-CM | POA: Diagnosis not present

## 2018-01-27 DIAGNOSIS — Z87891 Personal history of nicotine dependence: Secondary | ICD-10-CM

## 2018-01-27 DIAGNOSIS — Z8701 Personal history of pneumonia (recurrent): Secondary | ICD-10-CM

## 2018-01-27 LAB — PULMONARY FUNCTION TEST
DL/VA % pred: 120 %
DL/VA: 5.66 ml/min/mmHg/L
DLCO UNC % PRED: 78 %
DLCO unc: 18.02 ml/min/mmHg
FEF 25-75 Post: 1.28 L/sec
FEF 25-75 Pre: 1.12 L/sec
FEF2575-%Change-Post: 14 %
FEF2575-%PRED-POST: 54 %
FEF2575-%Pred-Pre: 47 %
FEV1-%Change-Post: 3 %
FEV1-%Pred-Post: 64 %
FEV1-%Pred-Pre: 62 %
FEV1-POST: 1.63 L
FEV1-Pre: 1.58 L
FEV1FVC-%CHANGE-POST: 6 %
FEV1FVC-%Pred-Pre: 91 %
FEV6-%CHANGE-POST: -1 %
FEV6-%Pred-Post: 69 %
FEV6-%Pred-Pre: 70 %
FEV6-PRE: 2.19 L
FEV6-Post: 2.16 L
FEV6FVC-%Change-Post: 0 %
FEV6FVC-%Pred-Post: 102 %
FEV6FVC-%Pred-Pre: 102 %
FVC-%Change-Post: -2 %
FVC-%PRED-POST: 66 %
FVC-%Pred-Pre: 68 %
FVC-Post: 2.16 L
FVC-Pre: 2.22 L
Post FEV1/FVC ratio: 75 %
Post FEV6/FVC ratio: 100 %
Pre FEV1/FVC ratio: 71 %
Pre FEV6/FVC Ratio: 99 %

## 2018-01-27 NOTE — Progress Notes (Signed)
Subjective:     Patient ID: Kimberly Shelton, female   DOB: Apr 14, 1961, 57 y.o.   MRN: [id number]  HPI    OV 03/04/2014 Chief Complaint  Patient presents with  . Follow-up    HFU-D/c from [facility]. Pt states she has chest soreness from coughing. Pt states with the O2 her breathing has improved. C/o dyspnea when working with PT and cough with yellow mucous.    Admitted 02/16/2014 through 02/24/2014 with right-sided pneumococcal pneumonia with cavitation in the setting of smoking and rheumatoid arthritis on immune modulators. At this point in time she is off the immunomodulators. Dr. Dierdre Forth her rheumatologist is aware of this. She is 25% back to her baseline but still very fatigued and short of breath. She coughs a lot with home physical therapy. She brings up green mucus and sputum with her cough needed overall cough is significantly improved since discharge. Appetite is slowly returning. 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 She is requesting to stay off work for several weeks. On 2 L there is documentation that when she walks 2 minutes up saturation still stays at 95%.   Today in the office pon room air at rest t it is 93%. Walked 40 feet and desaturated to 89% but HR 150/min per tech: I think this is all deconditioning but not sure if she has cardiomyopathy from her illness   04/02/14 Follow up  Pt returns for follow up from recent sever critical illness from streptococcal PNA , Bacteremia, sepsis 02/2014.   She had a right-sided pneumococcal pneumonia with cavitation in the setting of smoking and rheumatoid arthritis on immune modulators Today PFT show FEV1 61% , ratio 87  , FVC 55 %  , NO sign BD response, Diffusing capacity  Decreased 57% Has not smoked.  Wants to return to work soon.  CXR shows persistent RUL/LLL cavitary lesions .  Says she is feeling so much better.  No fever, chest pain, orthopnea, edema or n/v/d.   Good appetite . Seen by cards for tachycardia felt secondary to recent PN   OV 05/01/2014  Chief Complaint  Patient presents with  . Follow-up    Pt states her breathing has improved since last OV. Pt c/o prod cough with yellow and clear mucous and lower mid lumbar pain, pt thinks d/t RA. Pt doing duoneb only once a day.  Pt denies SOB.     Admitted 02/16/2014 through 02/24/2014 with right-sided pneumococcal pneumonia with cavitation in the setting of smoking and rheumatoid arthritis on immune modulators. Current followup is for the same   S: Presents with husband. Feels great overall. No fatigute. No dyspnea. Able to do all ADLs. Wants to return to work now and is asking for release to work. In terms of RA, she feels early morning stiffness of hand joint is returning. SHe is seeing Dr Dierdre Forth 05/01/14 and will discuss with him about return to immunomodulators. There are no other issues. CXR done after she left and at time of this note:    Dg Chest 2 View  04/30/2014   CLINICAL DATA:  Followup cavitary pneumonia  EXAM: CHEST  2 VIEW  COMPARISON:  04/02/2014  FINDINGS: Normal heart size, mediastinal contours, and pulmonary vascularity.  Patchy infiltrates are identified in RIGHT upper lobe and LEFT lower lobe consistent with pneumonia.  Cavitary foci identified previously in the in both the RIGHT upper and LEFT  lower lobes are still seen, the less evident in the RIGHT upper lobe.  Scattered interstitial prominence again identified.  No gross pleural effusion or pneumothorax.  Bones unremarkable.  IMPRESSION: Persistent infiltrates are identified in the RIGHT upper lobe and LEFT lower lobe with persistent visualization of cavitary foci.   Electronically Signed   By: Ulyses Southward M.D.   On: 04/30/2014 19:09      OV 08/09/2014  Chief Complaint  Patient presents with  . Follow-up    Pt here after CT scan. Pt denies change in breathing since last OV. Pt c/o prod cough with clear and green mucus.      Presents for Pneumococcal pneumonia followup that developed in setting of RA and Simponi  She is being maintained on ARAVA since her pneumococcal pneumonia earlier in 2013.  Now she is free of symptoms of respiratory tree. CT 08/08/14 shows further improvement but has architectural distortion. HEr RA is now significantly  active - per her and my talking 08/09/2014 with Dr Dierdre Forth. Patient is very apprehensive of starting ORENCIA recommended by Dr Dierdre Forth due to increased aecopd risk (She does not have copd), recurrent infection esp in setting of focal archiectrual distortion and recent pneumonia. She is very scared of dealing with RA disability v infection risk   Social and past and family: Mom had RA. She is upset abotu her disease  CT 08/08/14  IMPRESSION: 1. Emphysema with scattered scarring, volume loss, scattered bulla (some of which are still inflamed and with a somewhat cavitary appearance), and bilateral airway thickening. Much of this represents residua from prior multi lobar cavitary pneumonia, and is improved compared to the prior July CT scan. Given the degree of architectural distortion, scattered nodularity and opacities, and and irregular scarring, CT is not able to clear the lung of the possibility of malignancy, and surveillance imaging may be warranted.   Electronically Signed  By: [name]  On: 08/08/2014 15:32  OV 11/06/2014  Chief Complaint  Patient presents with  . Follow-up    Coughing up whitish colored phlegm, SOB with walking, not always, sometimes.      Presents for Pneumococcal pneumonia followup that developed in setting of RA and Simponi June 2015, with resultant architectural distortion in her lung anatomy  Last seen December 2015. Since then overall she's doing well. At baseline she only has mild cough with occasional white sputum. She also has exertional dyspnea when she climbs up a hill but clearly in cold air he did this is  relieved by rest. She is currently on 2 immunomodulators for her rheumatoid arthritis Orencia and Arava and this is helping her joints. She's not had any respiratory complications so far. I notice that she is not on Bactrim prophylaxis but she is concerned about the side effects. And she wants to talk to her rheumatologist Dr. Dierdre Forth about this  Past, Family, Social reviewed: no change since last visit   OV 02/10/2015  Chief Complaint  Patient presents with  . Follow-up    Pt stated her breathing feels it has improved since last OV. Pt here after PFT. Pt stated she has a little more trouble breathing when it is hot out. Pt c/o mild prod cough with clear and small light green mucus. Pt denies CP/tightness.    Follow-up chronic destructive pulmonary to changes following pneumococcal pneumonia in 2015 in the setting of previous heavy smoking and immunomodulatory treatment for rheumatoid arthritis   Since seeing me last in March 2016 she feels  that the albuterol inhaler that I tried for dyspnea and exertion and wheezing has helped. Dyspnea still persists. It is unchanged. It is mild. However it is improved with albuterol which she takes prior to exertion. There is no associated cough or. Wheezing is improved. No hemoptysis no fever no chills .Outside chart from Dr. Dierdre Forth her rheumatologist dated 02/04/2015 reviewed. She reported to him that she is compliant on Ornecia and arava with good relief and joint pain. Her dyspnea is mild chronic and stable. His advice for her to continue the same. He suggested that I look into her chronic residual dyspnea at this visit   Last pulmonary imaging with CT scan of the chest December 2015:residual changes + along with emphysema; report reviewed. Image not visualized  Pulmonary function test 02/10/2015 today personally reviewed image: FEV1 prebronchodilator 1.7 L/64%, ratio 69.  FEV1 postbronchodilator is 1.9 L/72% and is a 12% positive response with  broncho-dilator., FVC 2.4 L/72% and ratio of 79. Total lung capacity is 3.76 L/76%. DLCO is 18.5/80%. PFTs are consistent with restriction and a normal DLCO but in the prebronchodilator she has mild obstruction   OV 08/11/2015  Chief Complaint  Patient presents with  . Follow-up    Pt states her breathing has improved since last OV. Pt states she has only been using the spiriva as needed. Pt c/o prod cough with clear mucus and DOE with heavy exertion. pt denies CP/tightness.     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 She continues to do well. She is now on leflunomide and Orencia for her rheumatoid arthritis. With this rheumatoid arthritis symptoms have improved. She is follows up with Dr. Dierdre Forth in his new rheumatology practice [facility]. She feels that her wheezing has significantly improved. Shortness of breath significantly improved. She is not taking Spiriva anymore. She reported some hoarseness with it. She wants to continue to watch herself without Spiriva. She is up-to-date with her flu shot.   OV 02/09/2016  Chief Complaint  Patient presents with  . Follow-up    Pt reports that her breathing is doing well. Pt c/o occasional dry cough that is worse when using a vape for recreational use. Pt also c/o occasional wheeze with hot, humid weather and exertion. Pt does use albuterol HFA and her symptoms improve. Pt denies SOB/CP/tightness.        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 Doing well but has started smoking again though is nicotine-free vape smoking now. She did not want spiriva due to dpi and  throat irritation. But she is using albuterol atleast daily. She is having exertional wheeze but otherwise okayt. Dub Amis continues with Dr Dierdre Forth. She tells me she never had shingles vaccine. Otherwise well. Working at [employer] . Does not want cxr 02/09/2016 for fu. Wants to defer till next visit   OV 12/08/2017  Chief Complaint  Patient presents with  . Follow-up    Last seen 02/09/16.  Pt states she has been doing well since last visit and denies any complaints.      Follow-up chronic destructive pulmonary to changes following pneumococcal pneumonia in 2015 in the setting of previous heavy smoking and immunomodulatory treatment for rheumatoid arthritis  Personally not seen Anjolie Majer in almost 2 years.  She went through a separation and divorce and this time.  Smoking continues to be in remission.  At last visit she was smoking but now is in remission.  She continues to work at the [employer].  For her rheumatoid arthritis she has been switched out of orcencia to [place] and this is working well for her.  She only has some mild pain.  In terms of her respiratory symptoms she hardly has any symptoms.  Definitely no cough.  She has mild dyspnea on exertion when working out in the yard but this is stable.  Review of the chart shows that her last echocardiogram was in 2015.  Her last chest x-ray or imaging was in 2016 and so was a pulmonary function test.  She wants to make sure her lungs are doing okay at this point.   OV 01/27/2018  Chief Complaint  Patient presents with  . Follow-up    Breathing is improved since last OV. Still has some coughing in the mornings. Denies chest tightness, wheezing.   Destin Kittler presents for follow-up after testing.  In the interim she continues to be stable.  She only has mild dyspnea on exertion when she does gardening.  For this she uses albuterol as needed she does not want to upscale on her inhalers.  Testing showed atrial septal aneurysm and  coronary artery calcification.  Cardiology has reassured her on both accounts.  In terms of the lung function that seems to be a decline in FEV1 compared to 2016 but very similar to 2015.  DLCO is stable all along.  She feels stable.  CT scan of the chest shows improved bronchiectasis compared to few years ago.  The only issue is that in the right upper lobe that seems to be a cavity with a potential aspergilloma although this is totally uncertain at this stage.  She does not have any hemoptysis or any symptoms.  She is immunocompromised on rheumatoid arthritis medication Orencia at this point.  Apparently the Harriette Ohara was stopped.   Results for TEAGEN, BUCIO (MRN [id number]) as of 01/27/2018 10:48  Ref. Range 04/02/2014 11:42 02/10/2015 13:49 01/27/2018 09:57  FEV1-Post Latest Units: L 1.68 1.89 1.63  FEV1-%Pred-Post Latest Units: % 64 73 64  FEV1-%Change-Post Latest Units: % 3 12 3    Results for NEVAEN, TREDWAY (MRN [id number]) as of 01/27/2018 10:48  Ref. Range 04/02/2014 11:42 02/10/2015 13:49 01/27/2018 09:57  DLCO unc Latest Units: ml/min/mmHg 13.15 18.50 18.02  DLCO unc % pred Latest Units: % 57 80 78    IMPRESSION: CT chest 1. Widespread areas of bronchiectasis and scarring, generally similar to the prior examination. Two of the thick-walled cavitary areas in the right upper lobe seen on the prior study have resolved, however, there is a new area of scarring in the right upper lobe near the apex which contains some internal soft tissue which is favored to represent some sloughed necrotic lung tissue, although an aspergilloma is not entirely excluded. Close attention on future follow-up imaging is recommended. 2. Possible honeycombing in the extreme lung bases which could indicate early interstitial lung disease. Future follow-up examination should be performed is high-resolution chest CTs to better evaluate these findings. 3. Cholelithiasis. 4. Aortic atherosclerosis, in addition to  2 vessel coronary artery disease. Please note that although the presence of coronary artery calcium documents the presence of coronary artery disease, the severity of this disease and any potential stenosis cannot be  assessed on this non-gated CT examination. Assessment for potential risk factor modification, dietary therapy or pharmacologic therapy may be warranted, if clinically indicated.  Aortic Atherosclerosis (ICD10-I70.0).   Electronically Signed   By: Trudie Reed M.D.   On: 12/21/2017 13:00   ECHO 12/12/17 Study Conclusions  - Left ventricle: The cavity size was normal. Wall thickness was   normal. Systolic function was vigorous. The estimated ejection   fraction was in the range of 65% to 70%. Wall motion was normal;   there were no regional wall motion abnormalities. Doppler   parameters are consistent with abnormal left ventricular   relaxation (grade 1 diastolic dysfunction). - Atrial septum: There was an atrial septal aneurysm.  Impressions:  - Vigorous LV systolic function, mild diastolic dysfunction.    has a past medical history of Abnormal Pap smear (09/2006), CHF (congestive heart failure) (HCC), Foot fracture, left (10/16), Pneumonia, and RA (rheumatoid arthritis) (HCC) (07/2009).   reports that she quit smoking about 3 years ago. Her smoking use included cigarettes and e-cigarettes. She has a 30.00 pack-year smoking history. She has never used smokeless tobacco.  Past Surgical History:  Procedure Laterality Date  . CERVICAL BIOPSY  W/ LOOP ELECTRODE EXCISION  2008   CIN 3  . CERVICAL CONIZATION W/BX N/A 12/15/2015   Procedure: CONIZATION CERVIX WITH BIOPSY ;  Surgeon: Jerene Bears, MD;  Location: [facility];  Service: Gynecology;  Laterality: N/A;  POSSIBLE COLD KNIFE CONE.  Please have the microscope in the room  . COLONOSCOPY    . LEEP N/A 12/15/2015   Procedure: LOOP ELECTROSURGICAL EXCISION PROCEDURE (LEEP) with colpo;  Surgeon: Jerene Bears, MD;  Location: [facility];  Service: Gynecology;  Laterality: N/A;  . PILONIDAL CYST EXCISION  1999   I&D  . WISDOM TOOTH EXTRACTION      Allergies  Allergen Reactions  . Bee Venom Anaphylaxis  . Codeine Other (See Comments)    "seeing spots"  . Penicillins Rash    Has patient had a PCN reaction causing immediate rash, facial/tongue/throat swelling, SOB or lightheadedness with hypotension: No Has patient had a PCN reaction causing severe rash involving mucus membranes or skin necrosis: No Has patient had a PCN reaction that required hospitalization No Has patient had a PCN reaction occurring within the last 10 years: Yes If all of the above answers are "NO", then may proceed with Cephalosporin use.     Immunization History  Administered Date(s) Administered  . Influenza,inj,Quad PF,6+ Mos 06/25/2015, 05/31/2017  . Influenza-Unspecified 06/06/2014  . Pneumococcal Polysaccharide-23 02/18/2014  . Tdap 09/01/2016    Family History  Problem Relation Age of Onset  . Diabetes Mother   . Rheum arthritis Mother   . Diabetes Father   . COPD Father   . Bronchitis Father   . Cancer Brother        Lung  . Breast cancer Maternal Aunt 41  . CAD Neg Hx      Current Outpatient Medications:  .  Abatacept (ORENCIA) 125 MG/ML SOSY, Inject 125 mg into the skin every 30 (thirty) days., Disp: , Rfl:  .  albuterol (PROVENTIL HFA;VENTOLIN HFA) 108 (90 Base) MCG/ACT inhaler, Inhale 1-2 puffs into the lungs every 6 (six) hours as needed for wheezing or shortness of breath., Disp: , Rfl:  .  escitalopram (LEXAPRO) 10 MG tablet, TAKE 1 TABLET BY MOUTH EVERY DAY, Disp: 90 tablet, Rfl: 0  Review of Systems     Objective:   Physical Exam Vitals:  01/27/18 1022  BP: 110/78  Pulse: 97  SpO2: 97%  Weight: 144 lb (65.3 kg)  Height: 5\' 3"  (1.6 m)    Estimated body mass index is 25.51 kg/m as calculated from the following:   Height as of this encounter: 5\' 3"  (1.6 m).   Weight as of  this encounter: 144 lb (65.3 kg).     Assessment:       ICD-10-CM   1. Bronchiectasis without complication (HCC) J47.9   2. History of pneumonia Z87.01   3. History of smoking Z87.891    She has very minimal symptoms and residual bronchiectasis from the previous pneumonia.  Spiriva could help but she does not want to do it.  She is satisfied with albuterol as needed.  The only issue would be is if she has a simple aspergilloma which would be a new issue. But we are not certain about radiologically.  For this if she has minimal symptoms and the lung function is stable then we just adopt continued observation.  Therefore we will do a CT scan in 1 year.  But she does want to see me sooner so I will see her in 6 months with a spirometry.  For completion sake we will do IgG for Aspergillus.    Plan:      Glad minimal symptomsof shortness of breath with exertion and overall stable Glad cardiology workup reassuring Lung CT is improved but you have post pneumonia destruction called bronchiectasis In one area in right lung upper part there might be a fungal ball ; we are not sure  Plan Use albuterol as needed as discussed esp before gardening Do blood aspergillus IgG as indirect test for fungal ball For simple fungal ball with minimal symptoms we just keep an eye In 6 months do Pre-bd spiro and dlco only. No lung volume or bd response. No post-bd spiro In 12 months do CT chest followup without contrast  Followup 6 months after pft    Dr. , M.D., [facility].C.P Pulmonary and Critical Care Medicine Staff Physician, [facility] Director - Interstitial Lung Disease  Program  Pulmonary Fibrosis [facility] at [facility] [place], [facility], [place]  Pager: [phone], If no answer or between  15:00h - 7:00h: call [phone] Telephone: [phone]

## 2018-01-27 NOTE — Progress Notes (Signed)
PFT completed today.  

## 2018-01-27 NOTE — Patient Instructions (Addendum)
ICD-10-CM   1. Bronchiectasis without complication (HCC) J47.9   2. History of pneumonia Z87.01   3. History of smoking Z87.891    Glad minimal symptomsof shortness of breath with exertion and overall stable Glad cardiology workup reassuring Lung CT is improved but you have post pneumonia destruction called bronchiectasis In one area in right lung upper part there might be a fungal ball ; we are not sure  Plan Use albuterol as needed as discussed esp before gardening Do blood aspergillus IgG as indirect test for fungal ball For simple fungal ball with minimal symptoms we just keep an eye In 6 months do Pre-bd spiro and dlco only. No lung volume or bd response. No post-bd spiro In 12 months do CT chest followup without contrast  Followup 6 months after pft

## 2018-01-31 ENCOUNTER — Telehealth: Payer: Self-pay | Admitting: Internal Medicine

## 2018-01-31 DIAGNOSIS — M0589 Other rheumatoid arthritis with rheumatoid factor of multiple sites: Secondary | ICD-10-CM | POA: Diagnosis not present

## 2018-01-31 LAB — ALLERGEN A FUMIGATUS IGG: ASPERGILLUS FUMIGATUS(M3)IGG: 79.4 ug/mL — AB (ref <2.0)

## 2018-01-31 NOTE — Telephone Encounter (Signed)
Spoke with pt, she wanted to know why the blood test was ordered. I explained the fungal ball on CT and this was the reasoning why. She wanted to know what causes fungal infections in the lungs? The test is not resulted yet but would like [place] to call back with results and answer to her question. MR please advise.

## 2018-01-31 NOTE — Telephone Encounter (Signed)
Explained pathophysiology for aspergilloma. She verbalized understandng.

## 2018-02-02 ENCOUNTER — Telehealth: Payer: Self-pay | Admitting: Internal Medicine

## 2018-02-02 DIAGNOSIS — B4489 Other forms of aspergillosis: Secondary | ICD-10-CM

## 2018-02-02 DIAGNOSIS — J479 Bronchiectasis, uncomplicated: Secondary | ICD-10-CM

## 2018-02-02 NOTE — Telephone Encounter (Signed)
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 Range 01/27/2018 11:29  Aspergillus fumigatus (m3)IgG Latest Ref Range: <2.0 mcg/mL 79.4 (H)

## 2018-02-07 NOTE — Telephone Encounter (Signed)
Called and spoke with pt verifying that she had been made aware of the lab results. Pt verbalized that MR had discussed the results with her.  Referral has been placed for pt to see ID. Nothing further needed.

## 2018-02-13 ENCOUNTER — Other Ambulatory Visit: Payer: Self-pay | Admitting: Obstetrics & Gynecology

## 2018-02-13 NOTE — Telephone Encounter (Signed)
Medication refill request: Lexapro  Last AEX:  11-24-17  Next AEX: 11-30-18  Last MMG (if hormonal medication request): 12-08-17 WNL  Refill authorized: please advise

## 2018-02-14 DIAGNOSIS — M0589 Other rheumatoid arthritis with rheumatoid factor of multiple sites: Secondary | ICD-10-CM | POA: Diagnosis not present

## 2018-02-23 ENCOUNTER — Ambulatory Visit: Payer: Self-pay | Admitting: Obstetrics & Gynecology

## 2018-02-23 ENCOUNTER — Ambulatory Visit: Payer: 59 | Admitting: Obstetrics & Gynecology

## 2018-02-27 ENCOUNTER — Encounter: Payer: Self-pay | Admitting: Internal Medicine

## 2018-02-27 ENCOUNTER — Ambulatory Visit: Payer: BLUE CROSS/BLUE SHIELD | Admitting: Internal Medicine

## 2018-02-27 DIAGNOSIS — J984 Other disorders of lung: Secondary | ICD-10-CM | POA: Diagnosis not present

## 2018-02-27 NOTE — Addendum Note (Signed)
Addended by: Mariea Clonts D on: 02/27/2018 05:14 PM   Modules accepted: Orders

## 2018-02-27 NOTE — Progress Notes (Signed)
[facility]      Reason for Consult: positive IgG allergen Aspergillus with cavitary lung disease    Referring Physician: Dr. [name]    Patient ID: Kimberly Shelton, female    DOB: 05/15/1961, 57 y.o.   MRN: [id number]  HPI:   She is here for evaluation of above.  She has a history of emphysema based on bullous lesions and cavitation on CT from 2015 and recent CT with widespread bronchiectasis and scarring and two thick-walled cavitary areas and Dr. [name] did a blood test as above due to radiographic concerns for aspergillus.  She does have some morning sputum with occasional greenish sputum.  No weight loss, no night sweats, no worsening symptoms.  No fever.   CT independently reviewed with scarring noted, bronchiectasis, cavitary area.    Past Medical History:  Diagnosis Date  . Abnormal Pap smear 09/2006   HGSIL CIN 2/VAIN/ CIN 3/VAIN-3 CIS  . CHF (congestive heart failure) (HCC)   . Foot fracture, left 10/16   hair-line fracture  . Pneumonia    was hospitalized  . RA (rheumatoid arthritis) (HCC) 07/2009    Prior to Admission medications   Medication Sig Start Date End Date Taking? Authorizing Provider  Abatacept (ORENCIA) 125 MG/ML SOSY Inject 125 mg into the skin every 30 (thirty) days.   Yes [provider]  albuterol (PROVENTIL HFA;VENTOLIN HFA) 108 (90 Base) MCG/ACT inhaler Inhale 1-2 puffs into the lungs every 6 (six) hours as needed for wheezing or shortness of breath.   Yes [provider]  escitalopram (LEXAPRO) 10 MG tablet TAKE 1 TABLET BY MOUTH EVERY DAY 02/13/18  Yes Jerene Bears, MD  leflunomide (ARAVA) 20 MG tablet Take 20 mg by mouth daily.   Yes [provider]    Allergies  Allergen Reactions  . Bee Venom Anaphylaxis  . Codeine Other (See Comments)    "seeing spots"  . Penicillins Rash    Has patient had a PCN reaction causing immediate rash, facial/tongue/throat swelling, SOB or  lightheadedness with hypotension: No Has patient had a PCN reaction causing severe rash involving mucus membranes or skin necrosis: No Has patient had a PCN reaction that required hospitalization No Has patient had a PCN reaction occurring within the last 10 years: Yes If all of the above answers are "NO", then may proceed with Cephalosporin use.     Social History   Tobacco Use  . Smoking status: Former Smoker    Packs/day: 1.00    Years: 30.00    Pack years: 30.00    Types: Cigarettes, E-cigarettes    Last attempt to quit: 02/16/2014    Years since quitting: 4.0  . Smokeless tobacco: Never Used  Substance Use Topics  . Alcohol use: No    Alcohol/week: 0.0 oz  . Drug use: No    Family History  Problem Relation Age of Onset  . Diabetes Mother   . Rheum arthritis Mother   . Diabetes Father   . COPD Father   . Bronchitis Father   . Cancer Brother        Lung  . Breast cancer Maternal Aunt 42  . CAD Neg Hx     Review of Systems  Constitutional: negative for fevers, chills, malaise, anorexia and weight loss Gastrointestinal: negative for diarrhea Integument/breast: negative for rash All other systems reviewed and are negative    Constitutional: in no apparent distress and alert  Vitals:   02/27/18 1405  BP: 132/72  Pulse: 90  Temp: 98.4 F (36.9 C)   EYES: anicteric ENMT: no thrush Cardiovascular: Cor RRR Respiratory: distant, no crackles or wheezes; normal respiratory effort GI: Bowel sounds are normal, liver is not enlarged, spleen is not enlarged Musculoskeletal: no pedal edema noted Skin: negatives: no rash Hematologic: no cervical lad  Labs: Lab Results  Component Value Date   WBC 5.3 09/01/2016   HGB 12.3 09/01/2016   HCT 36.6 09/01/2016   MCV 86.1 09/01/2016   PLT 296.0 09/01/2016    Lab Results  Component Value Date   CREATININE 0.66 12/13/2017   BUN 8 12/13/2017   NA 139 12/13/2017   K 4.2 12/13/2017   CL 103 12/13/2017   CO2 29  12/13/2017    Lab Results  Component Value Date   ALT 11 09/01/2016   AST 15 09/01/2016   ALKPHOS 179 (H) 09/01/2016   BILITOT 0.4 09/01/2016     Assessment: new scarring/cavitary area of RUL and sloughed necrotic lung tissue vs aspergillus.  She is not overly symptomatic, no history of worsening, no weight loss or malaise and no cultures of Aspergillus.  I am uncertain if this is truly infectious based on the one blood test. I will do further testing and consideration if positive.  Plan: 1) aspergillus galactomannin 2) sputum for fungus rtc 2 weeks

## 2018-02-28 ENCOUNTER — Other Ambulatory Visit: Payer: BLUE CROSS/BLUE SHIELD

## 2018-02-28 DIAGNOSIS — J984 Other disorders of lung: Secondary | ICD-10-CM

## 2018-03-01 LAB — ASPERGILLUS ANTIGEN,SERUM
ASPERGILLUS AG, EIA: NOT DETECTED
INDEX VAL: 0.09 (ref <0.50)

## 2018-03-06 ENCOUNTER — Telehealth: Payer: Self-pay | Admitting: *Deleted

## 2018-03-06 NOTE — Telephone Encounter (Signed)
Negative so far.

## 2018-03-06 NOTE — Telephone Encounter (Signed)
Patient is calling for her lab results. She is aware that they may not be complete yet. She does plan on keeping her follow up appointment 7/8, but worried that there might be something to be worried about. Please advise. Andree Coss, RN

## 2018-03-13 ENCOUNTER — Encounter: Payer: Self-pay | Admitting: Internal Medicine

## 2018-03-13 ENCOUNTER — Ambulatory Visit (INDEPENDENT_AMBULATORY_CARE_PROVIDER_SITE_OTHER): Payer: BLUE CROSS/BLUE SHIELD | Admitting: Internal Medicine

## 2018-03-13 DIAGNOSIS — Z7189 Other specified counseling: Secondary | ICD-10-CM | POA: Diagnosis not present

## 2018-03-13 DIAGNOSIS — Z7185 Encounter for immunization safety counseling: Secondary | ICD-10-CM | POA: Insufficient documentation

## 2018-03-13 DIAGNOSIS — J984 Other disorders of lung: Secondary | ICD-10-CM | POA: Diagnosis not present

## 2018-03-13 NOTE — Assessment & Plan Note (Signed)
Counseled to keep up on her flu vaccines.   Has had pnuemonia vaccine

## 2018-03-13 NOTE — Assessment & Plan Note (Addendum)
No signs at this time of any active fungal disease with no worsening of symptoms and negative tests to date.  I will continue to monitor the fungal sputum culture but if it remains negative, no treatment indicated.  Can get follow up testing ([facility]) as indicated by pulmonary.   May need further CT follow up per Dr. [name] in6-12 months.

## 2018-03-13 NOTE — Progress Notes (Signed)
   Subjective:    Patient ID: Kimberly Shelton, female    DOB: December 12, 1960, 57 y.o.   MRN: [id number]  HPI She comes in for follow up of lung scarring, ? Aspergillus.   She continues to feel well with no sob, no worsening cough or change in sputum.  I checked an aspergillus galactomannin and sputum culture and no Aspergillus to date.  The sputum does have some scant yeast and has been sent for speciation.     Review of Systems  Constitutional: Negative for chills and fever.  Respiratory: Negative for chest tightness and shortness of breath.        Objective:   Physical Exam  Constitutional: She appears well-developed and well-nourished.  Pulmonary/Chest: Effort normal. No respiratory distress.          Assessment & Plan:

## 2018-03-14 DIAGNOSIS — Z79899 Other long term (current) drug therapy: Secondary | ICD-10-CM | POA: Diagnosis not present

## 2018-03-14 DIAGNOSIS — M0589 Other rheumatoid arthritis with rheumatoid factor of multiple sites: Secondary | ICD-10-CM | POA: Diagnosis not present

## 2018-03-15 ENCOUNTER — Telehealth: Payer: Self-pay | Admitting: *Deleted

## 2018-03-15 NOTE — Telephone Encounter (Signed)
-----   Message from Gardiner Barefoot, MD sent at 03/08/2018 11:07 AM EDT ----- Could you contact [employer] about the 'scant yeast' growth and have them further identify it.   thanks

## 2018-03-15 NOTE — Telephone Encounter (Signed)
Relayed message to [facility].  They spoke with [employer], this will go to further identification after 4 weeks of growth. Andree Coss, RN

## 2018-03-29 LAB — FUNGUS CULTURE W SMEAR
MICRO NUMBER:: 90757677
SMEAR:: NONE SEEN
SPECIMEN QUALITY:: ADEQUATE

## 2018-04-03 DIAGNOSIS — Z79899 Other long term (current) drug therapy: Secondary | ICD-10-CM | POA: Diagnosis not present

## 2018-04-03 DIAGNOSIS — M0589 Other rheumatoid arthritis with rheumatoid factor of multiple sites: Secondary | ICD-10-CM | POA: Diagnosis not present

## 2018-04-03 DIAGNOSIS — G5602 Carpal tunnel syndrome, left upper limb: Secondary | ICD-10-CM | POA: Diagnosis not present

## 2018-04-13 ENCOUNTER — Other Ambulatory Visit: Payer: Self-pay | Admitting: Internal Medicine

## 2018-04-25 DIAGNOSIS — M0589 Other rheumatoid arthritis with rheumatoid factor of multiple sites: Secondary | ICD-10-CM | POA: Diagnosis not present

## 2018-05-18 ENCOUNTER — Other Ambulatory Visit: Payer: Self-pay | Admitting: Obstetrics & Gynecology

## 2018-05-18 ENCOUNTER — Telehealth: Payer: Self-pay | Admitting: *Deleted

## 2018-05-18 MED ORDER — ESCITALOPRAM OXALATE 10 MG PO TABS: 10.0000 mg | ORAL_TABLET | Freq: Every day | ORAL | 2 refills | Status: DC

## 2018-05-18 NOTE — Telephone Encounter (Signed)
Message left to return call to [facility] at [phone].   Need patient to confirm pharmacy for refill.

## 2018-05-18 NOTE — Telephone Encounter (Signed)
Rx completed.  Ok to close encounter.   °

## 2018-05-18 NOTE — Telephone Encounter (Signed)
Medication refill request: lexapro  Last AEX:  11-24-17 SM Next AEX: 11-30-18  Last MMG (if hormonal medication request): 12-08-17 density C/BIRADS 1 negative  Refill authorized: 02-13-18 #30, 2RF. Today, please advise.   Medication pended for #30, 2RF to [facility] in [place].

## 2018-05-18 NOTE — Telephone Encounter (Signed)
Patient returning call to [facility] regarding prescription refill for Lexapro. Patient stated that she is no longer using [facility] in [place], and is now using [facility] in [place] on [address].

## 2018-05-19 ENCOUNTER — Other Ambulatory Visit: Payer: Self-pay | Admitting: Obstetrics & Gynecology

## 2018-05-22 ENCOUNTER — Telehealth: Payer: Self-pay | Admitting: Internal Medicine

## 2018-05-22 NOTE — Telephone Encounter (Signed)
Nothing needed at this time.  

## 2018-05-23 ENCOUNTER — Telehealth: Payer: Self-pay | Admitting: Internal Medicine

## 2018-05-23 DIAGNOSIS — M0589 Other rheumatoid arthritis with rheumatoid factor of multiple sites: Secondary | ICD-10-CM | POA: Diagnosis not present

## 2018-05-23 NOTE — Telephone Encounter (Signed)
Received paperwork and placed in MR purple look at folder on Side A at the cubby near the printer. Please advise.

## 2018-05-25 NOTE — Telephone Encounter (Signed)
Let her know that I reviewed request. I feel is best filled out by PCP Caryl Never, Elberta Fortis, MD) who has the full picture of lungs, joints, immune suppression all together. I can only fill out for the lungs . THe form does cover some lung aspect but rest of it is all general. Rheumatologist also shold be good fit for this form  Thanks    SIGNATURE    Dr. Kalman Shan, M.D., F.C.C.P,  Pulmonary and Critical Care Medicine Staff Physician, [facility] Center Director - Interstitial Lung Disease  Program  Pulmonary Fibrosis [facility] at [facility] [place], [place], [address]  Pager: [phone], If no answer or between  15:00h - 7:00h: call [phone] Telephone: [phone]  11:34 AM 05/25/2018

## 2018-05-25 NOTE — Telephone Encounter (Signed)
Called and spoke with patient and informed patient MR filled out his portion of the paper work. Patient requests that MR provide a letter detailing her lung condition as she is trying to get approved for disability along with the form.   MR please advise if willing to write a letter detailing her lung condition.

## 2018-05-25 NOTE — Telephone Encounter (Signed)
I can do a letter talking about lung. FYI - I did not fill out the form including the lung portion did not fill . Best the whole form filled by1 person.  I can do letter next week only. Please send this back to me

## 2018-05-25 NOTE — Telephone Encounter (Signed)
Spoke with pt and advised her that MR will write the letter and that it would be ready next week. Pt understood. Will route back to MR.  Fax to [employer] when done. Her fax number is in the folder MR was given with the form.

## 2018-05-29 ENCOUNTER — Ambulatory Visit (INDEPENDENT_AMBULATORY_CARE_PROVIDER_SITE_OTHER): Payer: BLUE CROSS/BLUE SHIELD | Admitting: *Deleted

## 2018-05-29 DIAGNOSIS — Z23 Encounter for immunization: Secondary | ICD-10-CM | POA: Diagnosis not present

## 2018-05-29 NOTE — Progress Notes (Signed)
Per orders of Dr. Burchette, injection of influenza vaccine given by Etheleen Valtierra A. Patient tolerated injection well. 

## 2018-05-30 NOTE — Telephone Encounter (Signed)
Kimberly Shelton  Please pick her form up from my blue partk of the desk  Thanks    SIGNATURE    Dr. Kalman Shan, M.D., F.C.C.P,  Pulmonary and Critical Care Medicine Staff Physician, [facility] Center Director - Interstitial Lung Disease  Program  Pulmonary Fibrosis [facility] at [facility] [place], [place], [address]  Pager: [phone], If no answer or between  15:00h - 7:00h: call [phone] Telephone: [phone]  5:13 PM 05/30/2018

## 2018-05-31 ENCOUNTER — Telehealth: Payer: Self-pay | Admitting: Obstetrics & Gynecology

## 2018-05-31 ENCOUNTER — Telehealth: Payer: Self-pay | Admitting: Family Medicine

## 2018-05-31 NOTE — Telephone Encounter (Signed)
Dr. Hyacinth Meeker -please advise on questionnaire.

## 2018-05-31 NOTE — Telephone Encounter (Signed)
Called patient and gave her the message from Dr. Caryl Never and patient stated that she would rather make a follow up appointment with Dr. Caryl Never to complete her paperwork for Jury Duty dismissal. Patient is scheduled on 06/02/18 at 2:45pm.

## 2018-05-31 NOTE — Telephone Encounter (Signed)
If based on her rheumatoid hx, would need to come from her rheumatologist.  If otherwise, she will need follow up to discuss/further evaluate.

## 2018-05-31 NOTE — Telephone Encounter (Signed)
Spoke with patient. Advised Dr. Hyacinth Meeker is out of the office today, will f/u with her when she returns on 9/26 and return call. Patient thankful for return call.

## 2018-05-31 NOTE — Telephone Encounter (Signed)
Patient is calling to stated the paperwork needs to state that she is unable to do jury duty due to her health history. She unable to seat for a long period of time.  Best contact number [phone]

## 2018-05-31 NOTE — Telephone Encounter (Signed)
Copied from CRM [id number]. Topic: Quick Communication - See Telephone Encounter >> May 31, 2018  3:50 PM Raoul Pitch, Mamie Nick wrote: Patient is requesting a call to see if her [employer] paperwork is complete

## 2018-05-31 NOTE — Telephone Encounter (Signed)
Patient calling to see when her lawyer can expect letter, CB is [phone]

## 2018-05-31 NOTE — Telephone Encounter (Signed)
Called patient and LMOVM to return call in regards to [employer] paperwork. Does the patient need a letter stating that they have Jury Duty? There are no instructions with the paperwork and need to know what she needs.  Ok for [facility] to Discuss results / PCP / recommendations / Schedule patient  CRM Created.

## 2018-05-31 NOTE — Telephone Encounter (Signed)
MR - please advise when letter will be ready. Thanks.

## 2018-05-31 NOTE — Telephone Encounter (Signed)
Patient is asking to talk with Dr.Miller's nurse about the form that dropped off last week for Dr.Miller to complete. Patient said "it was a questionnaire for her disability"?

## 2018-05-31 NOTE — Telephone Encounter (Signed)
Please see message.  Please advise. 

## 2018-06-02 ENCOUNTER — Encounter: Payer: Self-pay | Admitting: Family Medicine

## 2018-06-02 ENCOUNTER — Ambulatory Visit: Payer: BLUE CROSS/BLUE SHIELD | Admitting: Family Medicine

## 2018-06-02 ENCOUNTER — Other Ambulatory Visit: Payer: Self-pay

## 2018-06-02 VITALS — BP 100/66 | HR 85 | Temp 98.3°F | Ht 63.0 in | Wt 146.9 lb

## 2018-06-02 DIAGNOSIS — G8929 Other chronic pain: Secondary | ICD-10-CM | POA: Diagnosis not present

## 2018-06-02 DIAGNOSIS — M545 Low back pain: Secondary | ICD-10-CM | POA: Diagnosis not present

## 2018-06-02 NOTE — Progress Notes (Signed)
  Subjective:     Patient ID: Kimberly Shelton, female   DOB: 1960-12-01, 57 y.o.   MRN: [id number]  HPI Patient is seen with basically request for excuse from jury duty.  She has history of rheumatoid arthritis and is getting monthly  infusion therapy for that.  She has some chronic low back pain which she thinks is more osteoarthritis related.  She states that without getting up to stretch frequently she has severe low back pain.  She is requesting excuse her from jury duty for that reason.  She denies any recent lower extremity weakness or numbness.  Past Medical History:  Diagnosis Date  . Abnormal Pap smear 09/2006   HGSIL CIN 2/VAIN/ CIN 3/VAIN-3 CIS  . CHF (congestive heart failure) (HCC)   . Foot fracture, left 10/16   hair-line fracture  . Pneumonia    was hospitalized  . RA (rheumatoid arthritis) (HCC) 07/2009   Past Surgical History:  Procedure Laterality Date  . CERVICAL BIOPSY  W/ LOOP ELECTRODE EXCISION  2008   CIN 3  . CERVICAL CONIZATION W/BX N/A 12/15/2015   Procedure: CONIZATION CERVIX WITH BIOPSY ;  Surgeon: Jerene Bears, MD;  Location: [facility];  Service: Gynecology;  Laterality: N/A;  POSSIBLE COLD KNIFE CONE.  Please have the microscope in the room  . COLONOSCOPY    . LEEP N/A 12/15/2015   Procedure: LOOP ELECTROSURGICAL EXCISION PROCEDURE (LEEP) with colpo;  Surgeon: Jerene Bears, MD;  Location: [facility];  Service: Gynecology;  Laterality: N/A;  . PILONIDAL CYST EXCISION  1999   I&D  . WISDOM TOOTH EXTRACTION      reports that she quit smoking about 4 years ago. Her smoking use included cigarettes and e-cigarettes. She has a 30.00 pack-year smoking history. She has never used smokeless tobacco. She reports that she does not drink alcohol or use drugs. family history includes Breast cancer (age of onset: 54) in her maternal aunt; Bronchitis in her father; COPD in her father; Cancer in her brother; Diabetes in her father and mother; Rheum arthritis in her  mother. Allergies  Allergen Reactions  . Bee Venom Anaphylaxis  . Codeine Other (See Comments)    "seeing spots"  . Penicillins Rash    Has patient had a PCN reaction causing immediate rash, facial/tongue/throat swelling, SOB or lightheadedness with hypotension: No Has patient had a PCN reaction causing severe rash involving mucus membranes or skin necrosis: No Has patient had a PCN reaction that required hospitalization No Has patient had a PCN reaction occurring within the last 10 years: Yes If all of the above answers are "NO", then may proceed with Cephalosporin use.      Review of Systems  Gastrointestinal: Negative for abdominal pain.  Musculoskeletal: Positive for back pain.  Neurological: Negative for weakness and numbness.       Objective:   Physical Exam  Constitutional: She appears well-developed and well-nourished.  Cardiovascular: Normal rate and regular rhythm.  Pulmonary/Chest: Effort normal and breath sounds normal.  Musculoskeletal: She exhibits no edema.  Straight leg raise are negative bilaterally  Neurological:  Full strength lower extremities.  Symmetric reflexes.       Assessment:     Low back pain- suspect degenerative spondylosis.    Plan:     -Letter for jury duty given -consider PT for any persistent or progressive pain.  Kristian Covey MD [facility]

## 2018-06-05 NOTE — Telephone Encounter (Signed)
To whom it may concern   Re: Kimberly Shelton 1960/10/31  I have known Marquasha Brutus since 2015 when in the setting of immunosuppression from rheumatoid arthritis treatment she suffered severe pneumococcal pneumonia and critical illness with septicemia and bacteremia that left her lung with significant amount of scar tissue -  fibrotic lung disease and architectural distortion of the lung tissue.  This left her lungs week with a lung capacity approximately 61% [as judged by FEV1] and diminished.  Over the years she has managed her quality of life while undergoing rheumatoid arthritis treatment with immunomodulators.  However in 2019 her radiologic imaging changed showing the presence of a cavity with blood test positive for fungi suggesting what a possible opportunistic infection and somebody was immunosuppressed.  She is being monitored for this.  Given her weak immune system and a compromised lung function we do not think it is in her best interest be an active employment given high health needs   Please let me know if you have any other concerns     SIGNATURE    Dr. Kalman Shan, M.D., F.C.C.P,  Pulmonary and Critical Care Medicine Staff Physician, [facility] Center Director - Interstitial Lung Disease  Program  Pulmonary Fibrosis [facility] at [facility] [place], [place], [address]  Pager: [phone], If no answer or between  15:00h - 7:00h: call [phone] Telephone: [phone]  2:58 PM 06/05/2018

## 2018-06-05 NOTE — Telephone Encounter (Signed)
MR please advise. Thanks! 

## 2018-06-05 NOTE — Telephone Encounter (Signed)
Called spoke with patient. Faxed letter to lawyer. Fax number [phone]  Nothing further needed regarding letter.  Irving Burton please advise if the form needs further attention.

## 2018-06-05 NOTE — Telephone Encounter (Signed)
Pt is calling back about a letter to the lawyer. [phone]

## 2018-06-06 NOTE — Telephone Encounter (Signed)
As long as the letter to the lawyer is all pt needs, we should be fine. Will close encounter.

## 2018-06-09 NOTE — Telephone Encounter (Signed)
This has been completed and faxed to Candace Apple's office.  Please let her know.

## 2018-06-12 NOTE — Telephone Encounter (Signed)
Patient notified. Encounter closed

## 2018-06-20 DIAGNOSIS — M0589 Other rheumatoid arthritis with rheumatoid factor of multiple sites: Secondary | ICD-10-CM | POA: Diagnosis not present

## 2018-06-23 DIAGNOSIS — Z0289 Encounter for other administrative examinations: Secondary | ICD-10-CM

## 2018-07-04 DIAGNOSIS — Z6824 Body mass index (BMI) 24.0-24.9, adult: Secondary | ICD-10-CM | POA: Diagnosis not present

## 2018-07-04 DIAGNOSIS — M150 Primary generalized (osteo)arthritis: Secondary | ICD-10-CM | POA: Diagnosis not present

## 2018-07-04 DIAGNOSIS — Z79899 Other long term (current) drug therapy: Secondary | ICD-10-CM | POA: Diagnosis not present

## 2018-07-04 DIAGNOSIS — M0589 Other rheumatoid arthritis with rheumatoid factor of multiple sites: Secondary | ICD-10-CM | POA: Diagnosis not present

## 2018-07-10 ENCOUNTER — Ambulatory Visit: Payer: BLUE CROSS/BLUE SHIELD | Admitting: Internal Medicine

## 2018-07-18 DIAGNOSIS — M0589 Other rheumatoid arthritis with rheumatoid factor of multiple sites: Secondary | ICD-10-CM | POA: Diagnosis not present

## 2018-08-11 ENCOUNTER — Ambulatory Visit: Payer: BLUE CROSS/BLUE SHIELD | Admitting: Internal Medicine

## 2018-08-11 ENCOUNTER — Ambulatory Visit (INDEPENDENT_AMBULATORY_CARE_PROVIDER_SITE_OTHER): Payer: BLUE CROSS/BLUE SHIELD | Admitting: Internal Medicine

## 2018-08-11 ENCOUNTER — Encounter: Payer: Self-pay | Admitting: Internal Medicine

## 2018-08-11 VITALS — BP 108/60 | HR 72 | Ht 63.0 in | Wt 146.0 lb

## 2018-08-11 DIAGNOSIS — Z23 Encounter for immunization: Secondary | ICD-10-CM | POA: Diagnosis not present

## 2018-08-11 DIAGNOSIS — J479 Bronchiectasis, uncomplicated: Secondary | ICD-10-CM

## 2018-08-11 DIAGNOSIS — Z8701 Personal history of pneumonia (recurrent): Secondary | ICD-10-CM

## 2018-08-11 DIAGNOSIS — D849 Immunodeficiency, unspecified: Secondary | ICD-10-CM

## 2018-08-11 DIAGNOSIS — I2510 Atherosclerotic heart disease of native coronary artery without angina pectoris: Secondary | ICD-10-CM

## 2018-08-11 DIAGNOSIS — J471 Bronchiectasis with (acute) exacerbation: Secondary | ICD-10-CM

## 2018-08-11 DIAGNOSIS — D899 Disorder involving the immune mechanism, unspecified: Secondary | ICD-10-CM | POA: Diagnosis not present

## 2018-08-11 DIAGNOSIS — Z87891 Personal history of nicotine dependence: Secondary | ICD-10-CM | POA: Diagnosis not present

## 2018-08-11 DIAGNOSIS — Z79899 Other long term (current) drug therapy: Secondary | ICD-10-CM

## 2018-08-11 LAB — PULMONARY FUNCTION TEST
DL/VA % PRED: 101 %
DL/VA: 4.72 ml/min/mmHg/L
DLCO UNC % PRED: 67 %
DLCO UNC: 15.53 ml/min/mmHg
FEF 25-75 Pre: 1.07 L/sec
FEF2575-%Pred-Pre: 44 %
FEV1-%Pred-Pre: 63 %
FEV1-PRE: 1.6 L
FEV1FVC-%Pred-Pre: 90 %
FEV6-%PRED-PRE: 72 %
FEV6-Pre: 2.26 L
FEV6FVC-%PRED-PRE: 103 %
FVC-%PRED-PRE: 69 %
FVC-Pre: 2.26 L
PRE FEV6/FVC RATIO: 100 %
Pre FEV1/FVC ratio: 71 %

## 2018-08-11 NOTE — Progress Notes (Signed)
OV 03/04/2014 Chief Complaint  Patient presents with  . Follow-up    HFU-D/c from [facility]. Pt states she has chest soreness from coughing. Pt states with the O2 her breathing has improved. C/o dyspnea when working with PT and cough with yellow mucous.    Admitted 02/16/2014 through 02/24/2014 with right-sided pneumococcal pneumonia with cavitation in the setting of smoking and rheumatoid arthritis on immune modulators. At this point in time she is off the immunomodulators. Dr. Dierdre Forth her rheumatologist is aware of this. She is 25% back to her baseline but still very fatigued and short of breath. She coughs a lot with home physical therapy. She brings up green mucus and sputum with her cough needed overall cough is significantly improved since discharge. Appetite is slowly returning. 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 She is requesting to stay off work for several weeks. On 2 L there is documentation that when she walks 2 minutes up saturation still stays at 95%.   Today in the office pon room air at rest t it is 93%. Walked 40 feet and desaturated to 89% but HR 150/min per tech: I think this is all deconditioning but not sure if she has cardiomyopathy from her illness   04/02/14 Follow up  Pt returns for follow up from recent sever critical illness from streptococcal PNA , Bacteremia, sepsis 02/2014.   She had a right-sided pneumococcal pneumonia with cavitation in the setting of smoking and rheumatoid arthritis on immune modulators Today PFT show FEV1 61% , ratio 87  , FVC 55 %  , NO sign BD response, Diffusing capacity  Decreased 57% Has not smoked.  Wants to return to work soon.  CXR shows persistent RUL/LLL cavitary lesions .  Says she is feeling so much better.  No fever, chest pain, orthopnea, edema or n/v/d.  Good appetite . Seen by cards for tachycardia felt secondary to recent PN   OV 05/01/2014  Chief  Complaint  Patient presents with  . Follow-up    Pt states her breathing has improved since last OV. Pt c/o prod cough with yellow and clear mucous and lower mid lumbar pain, pt thinks d/t RA. Pt doing duoneb only once a day.  Pt denies SOB.     Admitted 02/16/2014 through 02/24/2014 with right-sided pneumococcal pneumonia with cavitation in the setting of smoking and rheumatoid arthritis on immune modulators. Current followup is for the same   S: Presents with husband. Feels great overall. No fatigute. No dyspnea. Able to do all ADLs. Wants to return to work now and is asking for release to work. In terms of RA, she feels early morning stiffness of hand joint is returning. SHe is seeing Dr Dierdre Forth 05/01/14 and will discuss with him about return to immunomodulators. There are no other issues. CXR done after she left and at time of this note:    Dg Chest 2 View  04/30/2014   CLINICAL DATA:  Followup cavitary pneumonia  EXAM: CHEST  2 VIEW  COMPARISON:  04/02/2014  FINDINGS: Normal heart size, mediastinal contours, and pulmonary vascularity.  Patchy infiltrates are identified in RIGHT upper lobe and LEFT lower lobe consistent with pneumonia.  Cavitary foci identified previously in the in both the RIGHT upper and LEFT lower lobes are still seen, the less evident in the RIGHT upper lobe.  Scattered interstitial prominence again identified.  No gross  pleural effusion or pneumothorax.  Bones unremarkable.  IMPRESSION: Persistent infiltrates are identified in the RIGHT upper lobe and LEFT lower lobe with persistent visualization of cavitary foci.   Electronically Signed   By: Ulyses Southward M.D.   On: 04/30/2014 19:09      OV 08/09/2014  Chief Complaint  Patient presents with  . Follow-up    Pt here after CT scan. Pt denies change in breathing since last OV. Pt c/o prod cough with clear and green mucus.     Presents for Pneumococcal pneumonia followup that developed in setting of RA and Simponi  She  is being maintained on ARAVA since her pneumococcal pneumonia earlier in 2013.  Now she is free of symptoms of respiratory tree. CT 08/08/14 shows further improvement but has architectural distortion. HEr RA is now significantly  active - per her and my talking 08/09/2014 with Dr Dierdre Forth. Patient is very apprehensive of starting ORENCIA recommended by Dr Dierdre Forth due to increased aecopd risk (She does not have copd), recurrent infection esp in setting of focal archiectrual distortion and recent pneumonia. She is very scared of dealing with RA disability v infection risk   Social and past and family: Mom had RA. She is upset abotu her disease  CT 08/08/14  IMPRESSION: 1. Emphysema with scattered scarring, volume loss, scattered bulla (some of which are still inflamed and with a somewhat cavitary appearance), and bilateral airway thickening. Much of this represents residua from prior multi lobar cavitary pneumonia, and is improved compared to the prior July CT scan. Given the degree of architectural distortion, scattered nodularity and opacities, and and irregular scarring, CT is not able to clear the lung of the possibility of malignancy, and surveillance imaging may be warranted.   Electronically Signed  By: [name]  On: 08/08/2014 15:32  OV 11/06/2014  Chief Complaint  Patient presents with  . Follow-up    Coughing up whitish colored phlegm, SOB with walking, not always, sometimes.      Presents for Pneumococcal pneumonia followup that developed in setting of RA and Simponi June 2015, with resultant architectural distortion in her lung anatomy  Last seen December 2015. Since then overall she's doing well. At baseline she only has mild cough with occasional white sputum. She also has exertional dyspnea when she climbs up a hill but clearly in cold air he did this is relieved by rest. She is currently on 2 immunomodulators for her rheumatoid arthritis Orencia and Arava and  this is helping her joints. She's not had any respiratory complications so far. I notice that she is not on Bactrim prophylaxis but she is concerned about the side effects. And she wants to talk to her rheumatologist Dr. Dierdre Forth about this  Past, Family, Social reviewed: no change since last visit   OV 02/10/2015  Chief Complaint  Patient presents with  . Follow-up    Pt stated her breathing feels it has improved since last OV. Pt here after PFT. Pt stated she has a little more trouble breathing when it is hot out. Pt c/o mild prod cough with clear and small light green mucus. Pt denies CP/tightness.    Follow-up chronic destructive pulmonary to changes following pneumococcal pneumonia in 2015 in the setting of previous heavy smoking and immunomodulatory treatment for rheumatoid arthritis   Since seeing me last in March 2016 she feels that the albuterol inhaler that I tried for dyspnea and exertion and wheezing has helped. Dyspnea still persists. It is unchanged. It  is mild. However it is improved with albuterol which she takes prior to exertion. There is no associated cough or. Wheezing is improved. No hemoptysis no fever no chills .Outside chart from Dr. Dierdre Forth her rheumatologist dated 02/04/2015 reviewed. She reported to him that she is compliant on Ornecia and arava with good relief and joint pain. Her dyspnea is mild chronic and stable. His advice for her to continue the same. He suggested that I look into her chronic residual dyspnea at this visit   Last pulmonary imaging with CT scan of the chest December 2015:residual changes + along with emphysema; report reviewed. Image not visualized  Pulmonary function test 02/10/2015 today personally reviewed image: FEV1 prebronchodilator 1.7 L/64%, ratio 69.  FEV1 postbronchodilator is 1.9 L/72% and is a 12% positive response with broncho-dilator., FVC 2.4 L/72% and ratio of 79. Total lung capacity is 3.76 L/76%. DLCO is 18.5/80%. PFTs are consistent  with restriction and a normal DLCO but in the prebronchodilator she has mild obstruction   OV 08/11/2015  Chief Complaint  Patient presents with  . Follow-up    Pt states her breathing has improved since last OV. Pt states she has only been using the spiriva as needed. Pt c/o prod cough with clear mucus and DOE with heavy exertion. pt denies CP/tightness.     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 She continues to do well. She is now on leflunomide and Orencia for her rheumatoid arthritis. With this rheumatoid arthritis symptoms have improved. She is follows up with Dr. Dierdre Forth in his new rheumatology practice [facility]. She feels that her wheezing has significantly improved. Shortness of breath significantly improved. She is not taking Spiriva anymore. She reported some hoarseness with it. She wants to continue to watch herself without Spiriva. She is up-to-date with her flu shot.   OV 02/09/2016  Chief Complaint  Patient presents with  . Follow-up    Pt reports that her breathing is doing well. Pt c/o occasional dry cough that is worse when using a vape for recreational use. Pt also c/o occasional wheeze with hot, humid weather and exertion. Pt does use albuterol HFA and her symptoms improve. Pt denies SOB/CP/tightness.        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 Doing well but has started smoking again though is nicotine-free vape smoking now. She did not want spiriva due to dpi and throat irritation. But she is using albuterol atleast daily. She is having exertional wheeze but otherwise okayt. Dub Amis  continues with Dr Dierdre Forth. She tells me she never had shingles vaccine. Otherwise well. Working at [employer] . Does not want cxr 02/09/2016 for fu. Wants to defer till next visit   OV 12/08/2017  Chief Complaint  Patient presents with  . Follow-up    Last seen 02/09/16.  Pt states she has been doing well since last visit and denies any complaints.      Follow-up chronic destructive pulmonary to changes following pneumococcal pneumonia in 2015 in the setting of previous heavy smoking and immunomodulatory treatment for rheumatoid arthritis   Personally not seen Dia Sitter in almost 2 years.  She went through a separation and divorce and this time.  Smoking  continues to be in remission.  At last visit she was smoking but now is in remission.  She continues to work at the [employer].  For her rheumatoid arthritis she has been switched out of orcencia to [place] and this is working well for her.  She only has some mild pain.  In terms of her respiratory symptoms she hardly has any symptoms.  Definitely no cough.  She has mild dyspnea on exertion when working out in the yard but this is stable.  Review of the chart shows that her last echocardiogram was in 2015.  Her last chest x-ray or imaging was in 2016 and so was a pulmonary function test.  She wants to make sure her lungs are doing okay at this point.   OV 01/27/2018  Chief Complaint  Patient presents with  . Follow-up    Breathing is improved since last OV. Still has some coughing in the mornings. Denies chest tightness, wheezing.   Kimberly Shelton presents for follow-up after testing.  In the interim she continues to be stable.  She only has mild dyspnea on exertion when she does gardening.  For this she uses albuterol as needed she does not want to upscale on her inhalers.  Testing showed atrial septal aneurysm and coronary artery calcification.  Cardiology has reassured her on both accounts.  In terms of the lung function that seems to be  a decline in FEV1 compared to 2016 but very similar to 2015.  DLCO is stable all along.  She feels stable.  CT scan of the chest shows improved bronchiectasis compared to few years ago.  The only issue is that in the right upper lobe that seems to be a cavity with a potential aspergilloma although this is totally uncertain at this stage.  She does not have any hemoptysis or any symptoms.  She is immunocompromised on rheumatoid arthritis medication Orencia at this point.  Apparently the Harriette Ohara was stopped.   Results for Kimberly Shelton, Kimberly Shelton (MRN [id number]) as of 01/27/2018 10:48  Ref. Range 04/02/2014 11:42 02/10/2015 13:49 01/27/2018 09:57  FEV1-Post Latest Units: L 1.68 1.89 1.63  FEV1-%Pred-Post Latest Units: % 64 73 64  FEV1-%Change-Post Latest Units: % 3 12 3    Results for Kimberly Shelton, Kimberly Shelton (MRN [id number]) as of 01/27/2018 10:48  Ref. Range 04/02/2014 11:42 02/10/2015 13:49 01/27/2018 09:57  DLCO unc Latest Units: ml/min/mmHg 13.15 18.50 18.02  DLCO unc % pred Latest Units: % 57 80 78    IMPRESSION: CT chest 1. Widespread areas of bronchiectasis and scarring, generally similar to the prior examination. Two of the thick-walled cavitary areas in the right upper lobe seen on the prior study have resolved, however, there is a new area of scarring in the right upper lobe near the apex which contains some internal soft tissue which is favored to represent some sloughed necrotic lung tissue, although an aspergilloma is not entirely excluded. Close attention on future follow-up imaging is recommended. 2. Possible honeycombing in the extreme lung bases which could indicate early interstitial lung disease. Future follow-up examination should be performed is high-resolution chest CTs to better evaluate these findings. 3. Cholelithiasis. 4. Aortic atherosclerosis, in addition to 2 vessel coronary artery disease. Please note that although the presence of coronary artery calcium documents the presence  of coronary artery disease, the severity of this disease and any potential stenosis cannot be assessed on this non-gated CT examination. Assessment for potential risk factor modification, dietary therapy or pharmacologic therapy may be warranted, if clinically  indicated.  Aortic Atherosclerosis (ICD10-I70.0).   Electronically Signed   By: Trudie Reed M.D.   On: 12/21/2017 13:00   ECHO 12/12/17 Study Conclusions  - Left ventricle: The cavity size was normal. Wall thickness was   normal. Systolic function was vigorous. The estimated ejection   fraction was in the range of 65% to 70%. Wall motion was normal;   there were no regional wall motion abnormalities. Doppler   parameters are consistent with abnormal left ventricular   relaxation (grade 1 diastolic dysfunction). - Atrial septum: There was an atrial septal aneurysm.  Impressions:  - Vigorous LV systolic function, mild diastolic dysfunction.  OV 08/11/2018  Subjective:  Patient ID: Kimberly Shelton, female , DOB: 1961-08-23 , age 9 y.o. , MRN: [id number] , ADDRESS: [address] [place] [address]   08/11/2018 -   Chief Complaint  Patient presents with  . Follow-up    f/u bronchiectasis, PFT today, pt doing well , only cough in tha am with little mucus     HPI Kimberly Shelton 57 y.o. -presents for follow-up of bronchiectasis suffered in 2015 following really severe pneumococcal pneumonia in the setting of immunosuppression due to rheumatoid arthritis.  This routine follow-up.  When I saw her in the spring 2019 we did a CT scan of the chest and there was suggestion of possible aspergillus cavity.  Did Aspergillus antibody and she was positive for IgG.  Therefore referred her to infectious diseases.  I reviewed the note.  Given overall stability expectant approach has been adopted.  At this point in time she feels stable using albuterol as needed.  Vaccine records indicate she could benefit from both  shingles vaccine and Prevnar.  She seems to be under the assumption that shingles vaccine is only after age 61.  I reviewed the literature and it is actually indicated for people over 50.  However she should only get the new Shingrix vaccine which is inactivated.  Prevnar is also indicated for her given her previous pneumococcal pneumonia and her immunosuppressed status.  Coronary artery calcification on CT scan: This was seen in April 2019.  Since then she did see cardiology.  They are aware of this and the place her on follow-up.  She does not have any chest pain currently.     ROS - per HPI     has a past medical history of Abnormal Pap smear (09/2006), CHF (congestive heart failure) (HCC), Foot fracture, left (10/16), Pneumonia, and RA (rheumatoid arthritis) (HCC) (07/2009).   reports that she quit smoking about 4 years ago. Her smoking use included cigarettes and e-cigarettes. She has a 30.00 Kimberly Shelton-year smoking history. She has never used smokeless tobacco.  Past Surgical History:  Procedure Laterality Date  . CERVICAL BIOPSY  W/ LOOP ELECTRODE EXCISION  2008   CIN 3  . CERVICAL CONIZATION W/BX N/A 12/15/2015   Procedure: CONIZATION CERVIX WITH BIOPSY ;  Surgeon: Jerene Bears, MD;  Location: [facility];  Service: Gynecology;  Laterality: N/A;  POSSIBLE COLD KNIFE CONE.  Please have the microscope in the room  . COLONOSCOPY    . LEEP N/A 12/15/2015   Procedure: LOOP ELECTROSURGICAL EXCISION PROCEDURE (LEEP) with colpo;  Surgeon: Jerene Bears, MD;  Location: [facility];  Service: Gynecology;  Laterality: N/A;  . PILONIDAL CYST EXCISION  1999   I&D  . WISDOM TOOTH EXTRACTION      Allergies  Allergen Reactions  . Bee Venom Anaphylaxis  . Codeine Other (See  Comments)    "seeing spots"  . Penicillins Rash    Has patient had a PCN reaction causing immediate rash, facial/tongue/throat swelling, SOB or lightheadedness with hypotension: No Has patient had a PCN reaction causing severe rash  involving mucus membranes or skin necrosis: No Has patient had a PCN reaction that required hospitalization No Has patient had a PCN reaction occurring within the last 10 years: Yes If all of the above answers are "NO", then may proceed with Cephalosporin use.     Immunization History  Administered Date(s) Administered  . Influenza,inj,Quad PF,6+ Mos 06/25/2015, 05/31/2017, 05/29/2018  . Influenza-Unspecified 06/06/2014  . Pneumococcal Polysaccharide-23 02/18/2014  . Tdap 09/01/2016    Family History  Problem Relation Age of Onset  . Diabetes Mother   . Rheum arthritis Mother   . Diabetes Father   . COPD Father   . Bronchitis Father   . Cancer Brother        Lung  . Breast cancer Maternal Aunt 77  . CAD Neg Hx      Current Outpatient Medications:  .  escitalopram (LEXAPRO) 10 MG tablet, Take 1 tablet (10 mg total) by mouth daily., Disp: 30 tablet, Rfl: 2 .  leflunomide (ARAVA) 20 MG tablet, Take 20 mg by mouth daily., Disp: , Rfl:  .  PROAIR HFA 108 (90 Base) MCG/ACT inhaler, INHALE 2 PUFFS INTO THE LUNGS EVERY 6 HOURS AS NEEDED FOR WHEEZING OR SHORTNESS OF BREATH, Disp: 6.7 g, Rfl: 0      Objective:   Vitals:   08/11/18 0933  BP: 108/60  Pulse: 72  SpO2: 95%  Weight: 146 lb (66.2 kg)  Height: 5\' 3"  (1.6 m)    Estimated body mass index is 25.86 kg/m as calculated from the following:   Height as of this encounter: 5\' 3"  (1.6 m).   Weight as of this encounter: 146 lb (66.2 kg).  @WEIGHTCHANGE @  [employer]   08/11/18 0933  Weight: 146 lb (66.2 kg)     Physical Exam  General Appearance:    Alert, cooperative, no distress, appears stated age - yes , Deconditioned looking - no , OBESE  - no, Sitting on Wheelchair -  no  Head:    Normocephalic, without obvious abnormality, atraumatic  Eyes:    PERRL, conjunctiva/corneas clear,  Ears:    Normal TM's and external ear canals, both ears  Nose:   Nares normal, septum midline, mucosa normal, no drainage    or  sinus tenderness. OXYGEN ON  - no . Patient is @ ra   Throat:   Lips, mucosa, and tongue normal; teeth and gums normal. Cyanosis on lips - no  Neck:   Supple, symmetrical, trachea midline, no adenopathy;    thyroid:  no enlargement/tenderness/nodules; no carotid   bruit or JVD  Back:     Symmetric, no curvature, ROM normal, no CVA tenderness  Lungs:     Distress - no , Wheeze no, Barrell Chest - no, Purse lip breathing - no, Crackles - occ scattered   Chest Wall:    No tenderness or deformity.    Heart:    Regular rate and rhythm, S1 and S2 normal, no rub   or gallop, Murmur - no  Breast Exam:    NOT DONE  Abdomen:     Soft, non-tender, bowel sounds active all four quadrants,    no masses, no organomegaly. Visceral obesity - no  Genitalia:   NOT DONE  Rectal:   NOT DONE  Extremities:   Extremities - normal, Has Cane - no, Clubbing - no, Edema - no  Pulses:   2+ and symmetric all extremities  Skin:   Stigmata of Connective Tissue Disease - no  Lymph nodes:   Cervical, supraclavicular, and axillary nodes normal  Psychiatric:  Neurologic:   Pleasant - yes, Anxious - no, Flat affect - no  CAm-ICU - neg, Alert and Oriented x 3 - yes, Moves all 4s - yes, Speech - normal, Cognition - intact           Assessment:       ICD-10-CM   1. Bronchiectasis without complication (HCC) J47.9   2. History of pneumonia Z87.01   3. History of smoking Z87.891   4. Immunosuppressed status (HCC) D89.9   5. High risk medication use Z79.899   6. Coronary artery calcification seen on CT scan I25.10        Plan:     Patient Instructions     ICD-10-CM   1. Bronchiectasis without complication (HCC) J47.9   2. History of pneumonia Z87.01   3. History of smoking Z87.891   4. Immunosuppressed status (HCC) D89.9   5. High risk medication use Z79.899   6. Coronary artery calcification seen on CT scan I25.10    Bronchiectasis without complication (HCC) History of pneumonia History of  smoking Immunosuppressed status (HCC)  - stable symptoms today without flare up and pft stable  over time since 2015 - prevnar vaccine 08/11/2018 - repeat CT chest without contrast summer 2020 esp looking at the possible cavity - continue albuterol as needed - Please talk to PCP Kristian Covey, MD -  and ensure you get  shingrix (GSK) inactivated vaccine against shingles - continue   Coronary artery calcification seen on CT scan - April 2020  - cardiology was aware of this in April 2019 visit  - you can address this with them at followup        SIGNATURE    Dr. Kalman Shan, M.D., F.C.C.P,  Pulmonary and Critical Care Medicine Staff Physician, [facility] Center Director - Interstitial Lung Disease  Program  Pulmonary Fibrosis [facility] at [facility] [place], [place], [address]  Pager: [phone], If no answer or between  15:00h - 7:00h: call [phone] Telephone: [phone]  10:07 AM 08/11/2018

## 2018-08-11 NOTE — Patient Instructions (Addendum)
ICD-10-CM   1. Bronchiectasis without complication (HCC) J47.9   2. History of pneumonia Z87.01   3. History of smoking Z87.891   4. Immunosuppressed status (HCC) D89.9   5. High risk medication use Z79.899   6. Coronary artery calcification seen on CT scan I25.10    Bronchiectasis without complication (HCC) History of pneumonia History of smoking Immunosuppressed status (HCC)  - stable symptoms today without flare up and pft stable  over time since 2015 - prevnar vaccine 08/11/2018 - repeat CT chest without contrast summer 2020 esp looking at the possible cavity - continue albuterol as needed - Please talk to PCP Kristian Covey, MD -  and ensure you get  shingrix (GSK) inactivated vaccine against shingles - continue   Coronary artery calcification seen on CT scan - April 2020  - cardiology was aware of this in April 2019 visit  - you can address this with them at followup   Followup - summer 2020 after CT chest

## 2018-08-11 NOTE — Progress Notes (Signed)
Spirometry and Dlco done today. 

## 2018-08-15 DIAGNOSIS — M0589 Other rheumatoid arthritis with rheumatoid factor of multiple sites: Secondary | ICD-10-CM | POA: Diagnosis not present

## 2018-08-21 ENCOUNTER — Other Ambulatory Visit: Payer: Self-pay | Admitting: Obstetrics & Gynecology

## 2018-08-21 NOTE — Telephone Encounter (Signed)
Medication refill request: Lexapro  10mg   Last AEX:  11/24/17 Next AEX: 11/30/18 Last MMG (if hormonal medication request): 12/08/17 density C / Bi-rads 1 neg  Refill authorized: #[id number] with 2 RF

## 2018-08-24 ENCOUNTER — Other Ambulatory Visit: Payer: Self-pay | Admitting: Obstetrics & Gynecology

## 2018-09-12 DIAGNOSIS — M0589 Other rheumatoid arthritis with rheumatoid factor of multiple sites: Secondary | ICD-10-CM | POA: Diagnosis not present

## 2018-10-04 DIAGNOSIS — M2550 Pain in unspecified joint: Secondary | ICD-10-CM | POA: Diagnosis not present

## 2018-10-04 DIAGNOSIS — M150 Primary generalized (osteo)arthritis: Secondary | ICD-10-CM | POA: Diagnosis not present

## 2018-10-04 DIAGNOSIS — Z79899 Other long term (current) drug therapy: Secondary | ICD-10-CM | POA: Diagnosis not present

## 2018-10-04 DIAGNOSIS — M0589 Other rheumatoid arthritis with rheumatoid factor of multiple sites: Secondary | ICD-10-CM | POA: Diagnosis not present

## 2018-10-10 DIAGNOSIS — M0589 Other rheumatoid arthritis with rheumatoid factor of multiple sites: Secondary | ICD-10-CM | POA: Diagnosis not present

## 2018-10-23 ENCOUNTER — Other Ambulatory Visit: Payer: Self-pay

## 2018-10-23 ENCOUNTER — Ambulatory Visit: Payer: BLUE CROSS/BLUE SHIELD | Admitting: Family Medicine

## 2018-10-23 ENCOUNTER — Encounter: Payer: Self-pay | Admitting: Family Medicine

## 2018-10-23 VITALS — BP 118/64 | HR 92 | Temp 98.1°F | Ht 63.0 in | Wt 141.8 lb

## 2018-10-23 DIAGNOSIS — R059 Cough, unspecified: Secondary | ICD-10-CM

## 2018-10-23 DIAGNOSIS — R05 Cough: Secondary | ICD-10-CM | POA: Diagnosis not present

## 2018-10-23 MED ORDER — DOXYCYCLINE HYCLATE 100 MG PO CAPS: 100.0000 mg | ORAL_CAPSULE | Freq: Two times a day (BID) | ORAL | 0 refills | Status: DC

## 2018-10-23 NOTE — Progress Notes (Signed)
Subjective:     Patient ID: Kimberly Shelton, female   DOB: Jan 21, 1961, 58 y.o.   MRN: [id number]  HPI Patient is seen with chief complaint of cough which started last week.  Productive of thick green sputum.  She is tried Mucinex and Robitussin without much improvement.  No fevers or chills.  She has rheumatoid arthritis and is on immunosuppressive therapy.  Past history of pneumonia.  Denies any nausea or vomiting.  No facial pain.  Quit smoking 2015  Past Medical History:  Diagnosis Date  . Abnormal Pap smear 09/2006   HGSIL CIN 2/VAIN/ CIN 3/VAIN-3 CIS  . CHF (congestive heart failure) (HCC)   . Foot fracture, left 10/16   hair-line fracture  . Pneumonia    was hospitalized  . RA (rheumatoid arthritis) (HCC) 07/2009   Past Surgical History:  Procedure Laterality Date  . CERVICAL BIOPSY  W/ LOOP ELECTRODE EXCISION  2008   CIN 3  . CERVICAL CONIZATION W/BX N/A 12/15/2015   Procedure: CONIZATION CERVIX WITH BIOPSY ;  Surgeon: Jerene Bears, MD;  Location: [facility];  Service: Gynecology;  Laterality: N/A;  POSSIBLE COLD KNIFE CONE.  Please have the microscope in the room  . COLONOSCOPY    . LEEP N/A 12/15/2015   Procedure: LOOP ELECTROSURGICAL EXCISION PROCEDURE (LEEP) with colpo;  Surgeon: Jerene Bears, MD;  Location: [facility];  Service: Gynecology;  Laterality: N/A;  . PILONIDAL CYST EXCISION  1999   I&D  . WISDOM TOOTH EXTRACTION      reports that she quit smoking about 4 years ago. Her smoking use included cigarettes and e-cigarettes. She has a 30.00 pack-year smoking history. She has never used smokeless tobacco. She reports that she does not drink alcohol or use drugs. family history includes Breast cancer (age of onset: 72) in her maternal aunt; Bronchitis in her father; COPD in her father; Cancer in her brother; Diabetes in her father and mother; Rheum arthritis in her mother. Allergies  Allergen Reactions  . Bee Venom Anaphylaxis  . Codeine Other (See Comments)    "seeing  spots"  . Penicillins Rash    Has patient had a PCN reaction causing immediate rash, facial/tongue/throat swelling, SOB or lightheadedness with hypotension: No Has patient had a PCN reaction causing severe rash involving mucus membranes or skin necrosis: No Has patient had a PCN reaction that required hospitalization No Has patient had a PCN reaction occurring within the last 10 years: Yes If all of the above answers are "NO", then may proceed with Cephalosporin use.      Review of Systems  Constitutional: Negative for chills and fever.  Respiratory: Positive for cough. Negative for shortness of breath and wheezing.        Objective:   Physical Exam Constitutional:      Appearance: Normal appearance.  Neck:     Musculoskeletal: Neck supple.  Cardiovascular:     Rate and Rhythm: Normal rate and regular rhythm.  Pulmonary:     Effort: Pulmonary effort is normal. No respiratory distress.     Breath sounds: Normal breath sounds. No wheezing or rales.  Neurological:     Mental Status: She is alert.        Assessment:     Cough.  May be viral but patient has increased risk secondary to immunosuppressive therapy.  Nonfocal exam no respiratory distress currently    Plan:     -Stay well-hydrated and may continue with Mucinex -We decided to go and cover  with doxycycline 100 mg twice daily for 7 days -Follow-up immediately for any fever, increased shortness of breath, or other concerns  Kristian Covey MD [facility] at [facility]

## 2018-10-23 NOTE — Patient Instructions (Signed)
Follow up for any fever or increased shortness of breath.  Consider Shingrix vaccine and check on insurance if interested.

## 2018-11-07 DIAGNOSIS — M0589 Other rheumatoid arthritis with rheumatoid factor of multiple sites: Secondary | ICD-10-CM | POA: Diagnosis not present

## 2018-11-19 ENCOUNTER — Other Ambulatory Visit: Payer: Self-pay | Admitting: Obstetrics & Gynecology

## 2018-11-20 NOTE — Telephone Encounter (Signed)
Medication refill request: Lexapro Last AEX:  11/24/17 SM Next AEX: 11/30/18 Last MMG (if hormonal medication request): 12/07/17 BIRADS 1 negative/density c Refill authorized: 08/21/18 #30 w/2 refills; today order pended for #30 w/0 refills until patient has AEX, if authorized.

## 2018-11-24 ENCOUNTER — Other Ambulatory Visit: Payer: Self-pay | Admitting: Internal Medicine

## 2018-11-30 ENCOUNTER — Ambulatory Visit: Payer: BLUE CROSS/BLUE SHIELD | Admitting: Obstetrics & Gynecology

## 2018-11-30 ENCOUNTER — Encounter

## 2018-12-05 DIAGNOSIS — M0589 Other rheumatoid arthritis with rheumatoid factor of multiple sites: Secondary | ICD-10-CM | POA: Diagnosis not present

## 2018-12-20 ENCOUNTER — Other Ambulatory Visit: Payer: Self-pay

## 2018-12-20 ENCOUNTER — Telehealth: Payer: Self-pay

## 2018-12-20 ENCOUNTER — Ambulatory Visit (INDEPENDENT_AMBULATORY_CARE_PROVIDER_SITE_OTHER): Payer: BLUE CROSS/BLUE SHIELD | Admitting: Family Medicine

## 2018-12-20 DIAGNOSIS — H9201 Otalgia, right ear: Secondary | ICD-10-CM

## 2018-12-20 MED ORDER — AZITHROMYCIN 250 MG PO TABS: ORAL_TABLET | ORAL | 0 refills | Status: AC

## 2018-12-20 NOTE — Telephone Encounter (Signed)
Sorry. I got busy this AM and didn't send.  Have now sent in.  Should be able to get this afternoon.

## 2018-12-20 NOTE — Addendum Note (Signed)
Addended by: Kristian Covey on: 12/20/2018 01:11 PM   Modules accepted: Orders

## 2018-12-20 NOTE — Telephone Encounter (Signed)
Patient called and stated that Rx is not at her pharmacy?

## 2018-12-20 NOTE — Progress Notes (Signed)
Patient ID: ALDYN CHAUDRY, female   DOB: August 15, 1961, 58 y.o.   MRN: [id number]  Virtual Visit via Video Note  I connected with Erin Sons on 12/20/18 at  9:15 AM EDT by a video enabled telemedicine application and verified that I am speaking with the correct person using two identifiers.  Location patient: home Location provider:work or home office Persons participating in the virtual visit: patient, provider  I discussed the limitations of evaluation and management by telemedicine and the availability of in person appointments. The patient expressed understanding and agreed to proceed.   HPI:  Patient has history of rheumatoid arthritis maintained on Arava.  Past history of smoking.  She called in with increased sinus congestion was started over the weekend.  She did a lot of yard work and initially thought this was all allergic.  She tried some Mucinex and Robitussin-DM.  She is now developed some right earache.  No drainage.  No hearing loss.  Does have occasional cough productive of green to brown sputum.  Not aware of any fever.  No recent travels.  No sick contacts.  No dyspnea.  Right ear symptom is the main symptom right now.  Denies any localized facial pain.  ROS: See pertinent positives and negatives per HPI.  Past Medical History:  Diagnosis Date  . Abnormal Pap smear 09/2006   HGSIL CIN 2/VAIN/ CIN 3/VAIN-3 CIS  . CHF (congestive heart failure) (HCC)   . Foot fracture, left 10/16   hair-line fracture  . Pneumonia    was hospitalized  . RA (rheumatoid arthritis) (HCC) 07/2009    Past Surgical History:  Procedure Laterality Date  . CERVICAL BIOPSY  W/ LOOP ELECTRODE EXCISION  2008   CIN 3  . CERVICAL CONIZATION W/BX N/A 12/15/2015   Procedure: CONIZATION CERVIX WITH BIOPSY ;  Surgeon: Jerene Bears, MD;  Location: [facility];  Service: Gynecology;  Laterality: N/A;  POSSIBLE COLD KNIFE CONE.  Please have the microscope in the room  . COLONOSCOPY    . LEEP N/A  12/15/2015   Procedure: LOOP ELECTROSURGICAL EXCISION PROCEDURE (LEEP) with colpo;  Surgeon: Jerene Bears, MD;  Location: [facility];  Service: Gynecology;  Laterality: N/A;  . PILONIDAL CYST EXCISION  1999   I&D  . WISDOM TOOTH EXTRACTION      Family History  Problem Relation Age of Onset  . Diabetes Mother   . Rheum arthritis Mother   . Diabetes Father   . COPD Father   . Bronchitis Father   . Cancer Brother        Lung  . Breast cancer Maternal Aunt 89  . CAD Neg Hx     SOCIAL HX: Former smoker.  Quit 2015   Current Outpatient Medications:  .  escitalopram (LEXAPRO) 10 MG tablet, TAKE 1 TABLET BY MOUTH EVERY DAY, Disp: 30 tablet, Rfl: 2 .  leflunomide (ARAVA) 20 MG tablet, Take 20 mg by mouth daily., Disp: , Rfl:  .  PROAIR HFA 108 (90 Base) MCG/ACT inhaler, IHALE 2 PUFFS INTO THE LUNGS EVERY 6 HOURS AS NEEDED FOR WHEEZING OR SHORTNESS OF BREATH, Disp: 8.5 g, Rfl: 3 .  tocilizumab (ACTEMRA) 400 MG/20ML SOLN injection, Inject into the vein once. Infusion once every 30 days, Disp: , Rfl:   EXAM:  VITALS per patient if applicable:  GENERAL: alert, oriented, appears well and in no acute distress  HEENT: atraumatic, conjunttiva clear, no obvious abnormalities on inspection of external nose and ears  NECK: normal movements  of the head and neck  LUNGS: on inspection no signs of respiratory distress, breathing rate appears normal, no obvious gross SOB, gasping or wheezing  CV: no obvious cyanosis  MS: moves all visible extremities without noticeable abnormality  PSYCH/NEURO: pleasant and cooperative, no obvious depression or anxiety, speech and thought processing grossly intact  ASSESSMENT AND PLAN:  Discussed the following assessment and plan:  Acute otalgia, right-she is describing some symptoms that may be allergic related but she does have productive cough and possible acute sinusitis symptoms as well.  She is penicillin allergic.  She is immunocompromised  -Discussed  measures to reduce coronavirus infection including frequent handwashing, social distancing, etc. -We elected to go ahead and cover with Zithromax for 5 days.  If ear pain not improving over the next few days will recommend office follow-up to assess further     I discussed the assessment and treatment plan with the patient. The patient was provided an opportunity to ask questions and all were answered. The patient agreed with the plan and demonstrated an understanding of the instructions.   The patient was advised to call back or seek an in-person evaluation if the symptoms worsen or if the condition fails to improve as anticipated.   Evelena Peat, MD

## 2019-01-01 ENCOUNTER — Encounter: Payer: Self-pay | Admitting: Family Medicine

## 2019-01-01 ENCOUNTER — Ambulatory Visit: Payer: BLUE CROSS/BLUE SHIELD | Admitting: Family Medicine

## 2019-01-01 ENCOUNTER — Telehealth: Payer: Self-pay | Admitting: Family Medicine

## 2019-01-01 ENCOUNTER — Other Ambulatory Visit: Payer: Self-pay

## 2019-01-01 VITALS — BP 118/76 | HR 79 | Temp 98.1°F | Ht 63.0 in | Wt 145.4 lb

## 2019-01-01 DIAGNOSIS — H7291 Unspecified perforation of tympanic membrane, right ear: Secondary | ICD-10-CM

## 2019-01-01 NOTE — Telephone Encounter (Signed)
Called patient and set her up for an in office visit today at 3:30pm.

## 2019-01-01 NOTE — Telephone Encounter (Signed)
Recommend be seen if ear pain persists.  Without looking would not indiscriminately use further antibiotics.

## 2019-01-01 NOTE — Telephone Encounter (Signed)
Please see message. °

## 2019-01-01 NOTE — Progress Notes (Signed)
Subjective:     Patient ID: Kimberly Shelton, female   DOB: 07/02/1961, 58 y.o.   MRN: [id number]  HPI Patient had encounter with [place] couple weeks ago by telemedicine for ear pain.  She had some sinus symptoms then.  She was prescribed Zithromax.  She called back earlier today with persistent ear pain.  She does not recall any trauma but does frequently use Q-tips.  She has not had any recent bleeding from the canal nor any drainage.  No hearing loss.  No dizziness.  She has been taking Tylenol which helps to relieve her pain.  Denies any prior history of ear trauma.  No recent flying.  Past Medical History:  Diagnosis Date  . Abnormal Pap smear 09/2006   HGSIL CIN 2/VAIN/ CIN 3/VAIN-3 CIS  . CHF (congestive heart failure) (HCC)   . Foot fracture, left 10/16   hair-line fracture  . Pneumonia    was hospitalized  . RA (rheumatoid arthritis) (HCC) 07/2009   Past Surgical History:  Procedure Laterality Date  . CERVICAL BIOPSY  W/ LOOP ELECTRODE EXCISION  2008   CIN 3  . CERVICAL CONIZATION W/BX N/A 12/15/2015   Procedure: CONIZATION CERVIX WITH BIOPSY ;  Surgeon: Jerene Bears, MD;  Location: [facility];  Service: Gynecology;  Laterality: N/A;  POSSIBLE COLD KNIFE CONE.  Please have the microscope in the room  . COLONOSCOPY    . LEEP N/A 12/15/2015   Procedure: LOOP ELECTROSURGICAL EXCISION PROCEDURE (LEEP) with colpo;  Surgeon: Jerene Bears, MD;  Location: [facility];  Service: Gynecology;  Laterality: N/A;  . PILONIDAL CYST EXCISION  1999   I&D  . WISDOM TOOTH EXTRACTION      reports that she quit smoking about 4 years ago. Her smoking use included cigarettes and e-cigarettes. She has a 30.00 pack-year smoking history. She has never used smokeless tobacco. She reports that she does not drink alcohol or use drugs. family history includes Breast cancer (age of onset: 36) in her maternal aunt; Bronchitis in her father; COPD in her father; Cancer in her brother; Diabetes in her father and mother;  Rheum arthritis in her mother. Allergies  Allergen Reactions  . Bee Venom Anaphylaxis  . Codeine Other (See Comments)    "seeing spots"  . Penicillins Rash    Has patient had a PCN reaction causing immediate rash, facial/tongue/throat swelling, SOB or lightheadedness with hypotension: No Has patient had a PCN reaction causing severe rash involving mucus membranes or skin necrosis: No Has patient had a PCN reaction that required hospitalization No Has patient had a PCN reaction occurring within the last 10 years: Yes If all of the above answers are "NO", then may proceed with Cephalosporin use.      Review of Systems  Constitutional: Negative for chills and fever.  HENT: Positive for ear pain. Negative for congestion, ear discharge and hearing loss.   Neurological: Negative for headaches.       Objective:   Physical Exam Constitutional:      Appearance: Normal appearance.  HENT:     Right Ear: Ear canal and external ear normal.     Left Ear: Tympanic membrane normal.     Ears:     Comments: She has perforation involving the posterior aspect of the right eardrum.  There is no bleeding.  No clotted blood.  No discharge.  No purulent secretions.  Perforation is fairly large Cardiovascular:     Rate and Rhythm: Normal rate and regular rhythm.  Pulmonary:     Effort: Pulmonary effort is normal.     Breath sounds: Normal breath sounds.  Neurological:     Mental Status: She is alert.        Assessment:     Patient has fairly large perforation of right eardrum.-Not clear whether this is chronic or acute but she has no prior knowledge of chronic perforation.  She does have right ear pain and denies any specific trauma though she frequently uses Q-tips    Plan:     -Keep water out of ear -Set up ENT referral -No signs of acute infection and so we did not prescribe any antibiotics today  Kristian Covey MD [facility] at [facility]

## 2019-01-01 NOTE — Telephone Encounter (Signed)
Copied from CRM [id number]. Topic: General - Other >> Jan 01, 2019  9:30 AM Leafy Ro wrote: Reason for CRM: pt is calling and still have right ear pain a little. Pt would like another round of abx. [facility] [place]. Pt had virtual appt with dr Caryl Never on 12-20-2018. Pt will come in if needed

## 2019-01-01 NOTE — Patient Instructions (Signed)
Eardrum Rupture, Adult    An eardrum rupture is a hole (perforation) in the eardrum. The eardrum is a thin, round tissue inside of the ear that separates the ear canal from the middle ear. The eardrum is also called the tympanic membrane. It transfers sound vibrations through small bones in the middle ear to the hearing nerve in the inner ear. It also protects the middle ear from germs. An eardrum rupture can cause pain and hearing loss.  What are the causes?  This condition may be caused by:  · An infection.  · A sudden injury, such as from:  ? Inserting a thin, sharp object into the ear.  ? A hit to the side of the head, especially by an open hand.  ? Falling onto water or a flat surface.  ? A rapid change in pressure, such as from flying or scuba diving.  ? A sudden increase in pressure against the eardrum, such as from an explosion or a very loud noise.  · Inserting a cotton-tipped swab in the ear.  · A long-term eustachian tube disorder. Eustachian tubes are parts of the body that connect each middle ear space to the back of the nose.  · A medical procedure or surgery, such as a procedure to remove wax from the ear canal.  · Removing a man-made pressure equalization tube(PE tube) that was placed through the eardrum.  · Having a PE tube fall out.  What increases the risk?  You are more likely to develop this condition if:  · You have had PE tubes inserted in your ears.  · You have an ear infection.  · You play sports that:  ? Involve balls or contact with other players.  ? Take place in water, such as diving, scuba diving, or waterskiing.  What are the signs or symptoms?  Symptoms of this condition include:  · Sudden pain at the time of the injury.  · Ear pain that suddenly improves.  · Ringing in the ear after the injury.  · Drainage from the ear. The drainage may be clear, cloudy or pus-like, or bloody.  · Hearing loss.  · Dizziness.  How is this diagnosed?  This condition is diagnosed based on your symptoms  and medical history as well as a physical exam. Your health care provider can usually see a perforation using an ear scope (otoscope). You may have tests, such as:  · A hearing test (audiogram) to check for hearing loss.  · A test in which a sample of ear drainage is tested for infection (culture).  How is this treated?  An eardrum typically heals on its own within a few weeks. If your eardrum does not heal, your health care provider may recommend a procedure to place a patch over your eardrum or surgery to repair your eardrum. Your health care provider may also prescribe antibiotic medicines to help prevent infection.  If the ear heals completely, any hearing loss should be temporary.  Follow these instructions at home:  · Keep your ear dry. This is very important. Follow instructions from your health care provider about how to keep your ear dry. You may need to wear waterproof earplugs when bathing and swimming.  · Take over-the-counter and prescription medicines only as told by your health care provider.  · Return to sports and activities as told by your health care provider. Ask your health care provider what activities are safe for you.  · Wear headgear with ear protection when   you play sports in which ear injuries are common.  · If directed, apply heat to your affected ear as often as told by your health care provider. Use the heat source that your health care provider recommends, such as a moist heat pack or a heating pad. This will help to relieve pain.  ? Place a towel between your skin and the heat source.  ? Leave the heat on for 20-30 minutes.  ? Remove the heat if your skin turns bright red. This is especially important if you are unable to feel pain, heat, or cold. You may have a greater risk of getting burned.  · Keep all follow-up visits as told by your health care provider. This is important.  · Talk to your health care provider before traveling by plane.  Contact a health care provider if:  · You  have mucus or blood draining from your ear.  · You have a fever.  · You have ear pain.  · You have hearing loss, dizziness, or ringing in your ear.  Get help right away if:  · You have sudden hearing loss.  · You are very dizzy.  · You have severe ear pain.  · Your face feels weak or becomes paralyzed.  Summary  · An eardrum rupture is a hole (perforation) in the eardrum that can cause pain and hearing loss. It is usually caused by a sudden injury to the ear.  · The eardrum will likely heal on its own within a few weeks. In some cases, surgery may be necessary.  · After the injury, follow instructions from your health care provider about how to keep your ear dry as it heals.  This information is not intended to replace advice given to you by your health care provider. Make sure you discuss any questions you have with your health care provider.  Document Released: 08/20/2000 Document Revised: 10/29/2016 Document Reviewed: 10/29/2016  Elsevier Interactive Patient Education © 2019 [employer]

## 2019-01-10 DIAGNOSIS — M0589 Other rheumatoid arthritis with rheumatoid factor of multiple sites: Secondary | ICD-10-CM | POA: Diagnosis not present

## 2019-01-11 ENCOUNTER — Telehealth: Payer: Self-pay | Admitting: *Deleted

## 2019-01-11 NOTE — Telephone Encounter (Signed)
Set up Doxy 

## 2019-01-11 NOTE — Telephone Encounter (Signed)
Pt called in stating she was up until 4am with extreme ear pain just wanted to make Dr. Caryl Never and nurse aware she was unsure if anything was available to help with her ear pain until she could get in with ENT. She is asking for a return call from [facility]

## 2019-01-11 NOTE — Telephone Encounter (Signed)
Pt calling back to check status. Please advise  °

## 2019-01-11 NOTE — Telephone Encounter (Signed)
Copied from CRM [id number]. Topic: General - Other >> Jan 10, 2019  2:13 PM Richarda Blade wrote: Reason for CRM: The patient called wanting to know what is going on with the referral that Dr. Caryl Never is going to do for her. She would like a call back from his nurse at [phone]

## 2019-01-11 NOTE — Telephone Encounter (Signed)
Called patient and she has an appointment with Dr. Doran Heater tomorrow at 10:30 am and she stated that she will wait until she goes to doctor tomorrow and thanked you.

## 2019-01-11 NOTE — Telephone Encounter (Signed)
I have sent Kimberly Shelton a message to see if she can get her into ENT as soon as possible. Do you want to set up another Doxy?

## 2019-01-12 DIAGNOSIS — H7291 Unspecified perforation of tympanic membrane, right ear: Secondary | ICD-10-CM | POA: Diagnosis not present

## 2019-01-22 DIAGNOSIS — Z79899 Other long term (current) drug therapy: Secondary | ICD-10-CM | POA: Diagnosis not present

## 2019-01-22 DIAGNOSIS — G5602 Carpal tunnel syndrome, left upper limb: Secondary | ICD-10-CM | POA: Diagnosis not present

## 2019-01-22 DIAGNOSIS — M150 Primary generalized (osteo)arthritis: Secondary | ICD-10-CM | POA: Diagnosis not present

## 2019-01-22 DIAGNOSIS — M0589 Other rheumatoid arthritis with rheumatoid factor of multiple sites: Secondary | ICD-10-CM | POA: Diagnosis not present

## 2019-01-26 ENCOUNTER — Ambulatory Visit: Payer: BLUE CROSS/BLUE SHIELD | Admitting: Obstetrics & Gynecology

## 2019-01-30 DIAGNOSIS — H7291 Unspecified perforation of tympanic membrane, right ear: Secondary | ICD-10-CM | POA: Diagnosis not present

## 2019-02-07 DIAGNOSIS — M0589 Other rheumatoid arthritis with rheumatoid factor of multiple sites: Secondary | ICD-10-CM | POA: Diagnosis not present

## 2019-02-13 ENCOUNTER — Telehealth: Payer: Self-pay

## 2019-02-13 ENCOUNTER — Ambulatory Visit (INDEPENDENT_AMBULATORY_CARE_PROVIDER_SITE_OTHER): Payer: BC Managed Care – PPO | Admitting: Family Medicine

## 2019-02-13 ENCOUNTER — Other Ambulatory Visit: Payer: Self-pay

## 2019-02-13 DIAGNOSIS — J0111 Acute recurrent frontal sinusitis: Secondary | ICD-10-CM | POA: Diagnosis not present

## 2019-02-13 MED ORDER — DOXYCYCLINE HYCLATE 100 MG PO CAPS: 100.0000 mg | ORAL_CAPSULE | Freq: Two times a day (BID) | ORAL | 0 refills | Status: DC

## 2019-02-13 NOTE — Telephone Encounter (Signed)
Copied from [place]oke [id number]. Topic: General - Inquiry >> Feb 13, 2019  8:41 AM Richardo Priest, NT wrote: Reason for CRM: Patient is hoping to get a call back from Dr.Burchette's nurse to see if she can discuss the possibility of getting a stronger antibiotic for her sinus infection. Patient states it got a bit better and then went back to feeling horrible. Please advise. Call back is [phone].

## 2019-02-13 NOTE — Progress Notes (Signed)
Patient ID: Kimberly Shelton, female   DOB: 12-26-1960, 58 y.o.   MRN: [id number]  This visit type was conducted due to national recommendations for restrictions regarding the COVID-19 pandemic in an effort to limit this patient's exposure and mitigate transmission in our community.   Virtual Visit via Video Note  I connected with Kimberly Shelton on 02/13/19 at 10:30 AM EDT by a video enabled telemedicine application and verified that I am speaking with the correct person using two identifiers.  Location patient: home Location provider:work or home office Persons participating in the virtual visit: patient, provider  I discussed the limitations of evaluation and management by telemedicine and the availability of in person appointments. The patient expressed understanding and agreed to proceed.   HPI:  Patient is concerned regarding some persistent sinusitis symptoms.  She was treated several weeks ago with Zithromax and did feel some better afterwards.  She came in with some right otalgia and was noted to have perforated eardrum but not clear this was causing her pain.  She went over [facility] he has been seen there.  She gets audiology testing later this summer.  There is been consideration for possible paper patch to her eardrum.  She denies any recent drainage.  She relates sinus symptoms mostly frontal but somewhat maxillary symptoms.  She has some colored discharge and also cough productive of brown sputum.  Allergy to penicillin.  No fever.  Some increased malaise.  No hemoptysis.  No appetite or weight changes.  No dyspnea.  ROS: See pertinent positives and negatives per HPI.  Past Medical History:  Diagnosis Date  . Abnormal Pap smear 09/2006   HGSIL CIN 2/VAIN/ CIN 3/VAIN-3 CIS  . CHF (congestive heart failure) ([place])   . Foot fracture, left 10/16   hair-line fracture  . Pneumonia    was hospitalized  . RA (rheumatoid arthritis) ([place]) 07/2009    Past Surgical History:   Procedure Laterality Date  . CERVICAL BIOPSY  W/ LOOP ELECTRODE EXCISION  2008   CIN 3  . CERVICAL CONIZATION W/BX N/A 12/15/2015   Procedure: CONIZATION CERVIX WITH BIOPSY ;  Surgeon: Megan Salon, MD;  Location: [facility];  Service: Gynecology;  Laterality: N/A;  POSSIBLE COLD KNIFE CONE.  Please have the microscope in the room  . COLONOSCOPY    . LEEP N/A 12/15/2015   Procedure: LOOP ELECTROSURGICAL EXCISION PROCEDURE (LEEP) with colpo;  Surgeon: Megan Salon, MD;  Location: [facility];  Service: Gynecology;  Laterality: N/A;  . PILONIDAL CYST EXCISION  1999   I&D  . WISDOM TOOTH EXTRACTION      Family History  Problem Relation Age of Onset  . Diabetes Mother   . Rheum arthritis Mother   . Diabetes Father   . COPD Father   . Bronchitis Father   . Cancer Brother        Lung  . Breast cancer Maternal Aunt 68  . CAD Neg Hx     SOCIAL HX: Former smoker.  Quit 2015   Current Outpatient Medications:  .  escitalopram (LEXAPRO) 10 MG tablet, TAKE 1 TABLET BY MOUTH EVERY DAY, Disp: 30 tablet, Rfl: 2 .  leflunomide (ARAVA) 20 MG tablet, Take 20 mg by mouth daily., Disp: , Rfl:  .  PROAIR HFA 108 (90 Base) MCG/ACT inhaler, IHALE 2 PUFFS INTO THE LUNGS EVERY 6 HOURS AS NEEDED FOR WHEEZING OR SHORTNESS OF BREATH, Disp: 8.5 g, Rfl: 3 .  tocilizumab (ACTEMRA) 400 MG/20ML SOLN injection,  Inject into the vein once. Infusion once every 30 days, Disp: , Rfl:   EXAM:  VITALS per patient if applicable:  GENERAL: alert, oriented, appears well and in no acute distress  HEENT: atraumatic, conjunttiva clear, no obvious abnormalities on inspection of external nose and ears  NECK: normal movements of the head and neck  LUNGS: on inspection no signs of respiratory distress, breathing rate appears normal, no obvious gross SOB, gasping or wheezing  CV: no obvious cyanosis  MS: moves all visible extremities without noticeable abnormality  PSYCH/NEURO: pleasant and cooperative, no obvious  depression or anxiety, speech and thought processing grossly intact  ASSESSMENT AND PLAN:  Discussed the following assessment and plan:  Persistent versus recurrent sinusitis symptoms  -Recommend treatment with doxycycline for 10 days.  If symptoms not clearing with that consider anaerobic coverage with Levaquin.    I discussed the assessment and treatment plan with the patient. The patient was provided an opportunity to ask questions and all were answered. The patient agreed with the plan and demonstrated an understanding of the instructions.   The patient was advised to call back or seek an in-person evaluation if the symptoms worsen or if the condition fails to improve as anticipated.    Evelena Peat, MD

## 2019-02-13 NOTE — Telephone Encounter (Signed)
Doxy scheduled. Nothing further needed.

## 2019-02-13 NOTE — Telephone Encounter (Signed)
Set up Doxy 

## 2019-02-18 ENCOUNTER — Other Ambulatory Visit: Payer: Self-pay | Admitting: Obstetrics & Gynecology

## 2019-02-19 NOTE — Telephone Encounter (Signed)
Medication refill request: lexapro  Last AEX:  11-24-17 SM  Next AEX: 05-03-2019 Last MMG (if hormonal medication request): n/a Refill authorized: 11-23-18, #30, 2RF. Today, please advise.   Medication pended for #30, 2RF. Please refill if appropriate.

## 2019-02-21 ENCOUNTER — Other Ambulatory Visit: Payer: Self-pay | Admitting: Obstetrics & Gynecology

## 2019-02-21 DIAGNOSIS — Z1231 Encounter for screening mammogram for malignant neoplasm of breast: Secondary | ICD-10-CM

## 2019-02-22 ENCOUNTER — Ambulatory Visit: Payer: Self-pay

## 2019-02-22 NOTE — Telephone Encounter (Signed)
Returned call to patient. Left VM to return call to office. 

## 2019-02-22 NOTE — Telephone Encounter (Signed)
  Reason for Disposition . Caller has NON-URGENT medication question about med that PCP prescribed and triager unable to answer question  Answer Assessment - Initial Assessment Questions 1. SYMPTOMS: "Do you have any symptoms?"     Nausea and diarrhea 2. SEVERITY: If symptoms are present, ask "Are they mild, moderate or severe?"     moderate  Protocols used: MEDICATION QUESTION CALL-A-AH

## 2019-02-22 NOTE — Telephone Encounter (Signed)
02/13/19 Dr. Elease Hashimoto prescribed doxycycline 100 mg caps twice daily for sinus infection.  She has taken a total of 7 days but experiencing nausea and diarrhea that started on day 4 after taking medication.  Diarrhea in the mornings and was unable to eat anything until this morning due to the nausea after taking the medication. Rt ear still sore and left earache now, coughing up phlegm of brownish/greenish color and thick. She has not taken the medication this morning and has been able to eat breakfast. Reviewed care advice regarding keeping hydrated. Lips and mouth moist today after eating. No dizziness. Voiding without difficulty. No fever. Pharmacy on file is [facility] in [place]. Please advise further regarding medication

## 2019-02-22 NOTE — Telephone Encounter (Signed)
D/c Doxycycline.   Start Levaquin 500 mg po qd for 7 days.

## 2019-02-23 ENCOUNTER — Other Ambulatory Visit: Payer: Self-pay

## 2019-02-23 ENCOUNTER — Telehealth: Payer: Self-pay

## 2019-02-23 MED ORDER — LEVOFLOXACIN 500 MG PO TABS: 500.0000 mg | ORAL_TABLET | Freq: Every day | ORAL | 0 refills | Status: DC

## 2019-02-23 NOTE — Telephone Encounter (Signed)
Called patient and gave her message from Dr. Elease Hashimoto. Patient verbalized an understanding and will call back if symptoms become worse.

## 2019-02-23 NOTE — Telephone Encounter (Signed)
I called patient this morning and her new medication has been sent to the pharmacy. Patient verbalized an understanding.  Copied from [place] [id number]. Topic: General - Other >> Feb 23, 2019  9:53 AM Yvette Rack wrote: Reason for CRM: Pt stated she requested that Dr. Elease Hashimoto nurse call her back but she never received call back. Cb# [phone]

## 2019-02-23 NOTE — Telephone Encounter (Signed)
I also let her know that I have sent in the new medication.

## 2019-03-07 ENCOUNTER — Telehealth: Payer: Self-pay | Admitting: Cardiovascular Disease

## 2019-03-07 ENCOUNTER — Telehealth: Payer: Self-pay | Admitting: *Deleted

## 2019-03-07 DIAGNOSIS — M0589 Other rheumatoid arthritis with rheumatoid factor of multiple sites: Secondary | ICD-10-CM | POA: Diagnosis not present

## 2019-03-07 NOTE — Telephone Encounter (Signed)
Called patient made her aware CT is WO IV contrast.

## 2019-03-07 NOTE — Telephone Encounter (Signed)
New Message            Patient is calling to see if dye would be used in her CT on tomorrow. Pls call to advise.

## 2019-03-07 NOTE — Telephone Encounter (Signed)

## 2019-03-08 ENCOUNTER — Ambulatory Visit (INDEPENDENT_AMBULATORY_CARE_PROVIDER_SITE_OTHER)
Admission: RE | Admit: 2019-03-08 | Discharge: 2019-03-08 | Disposition: A | Discharge status: Home / Self Care | Payer: BC Managed Care – PPO | Source: Ambulatory Visit | Attending: Internal Medicine | Admitting: Internal Medicine

## 2019-03-08 ENCOUNTER — Other Ambulatory Visit: Payer: Self-pay

## 2019-03-08 ENCOUNTER — Telehealth: Payer: Self-pay | Admitting: Internal Medicine

## 2019-03-08 DIAGNOSIS — J439 Emphysema, unspecified: Secondary | ICD-10-CM | POA: Diagnosis not present

## 2019-03-08 DIAGNOSIS — B4489 Other forms of aspergillosis: Secondary | ICD-10-CM

## 2019-03-08 DIAGNOSIS — J479 Bronchiectasis, uncomplicated: Secondary | ICD-10-CM | POA: Diagnosis not present

## 2019-03-08 DIAGNOSIS — J471 Bronchiectasis with (acute) exacerbation: Secondary | ICD-10-CM | POA: Diagnosis not present

## 2019-03-08 DIAGNOSIS — I700 Atherosclerosis of aorta: Secondary | ICD-10-CM | POA: Diagnosis not present

## 2019-03-08 NOTE — Telephone Encounter (Signed)
Hi [name]  This is a patient you saw in July 2019. Immune suppresed . Had CT suggestive of aspergilloma but was small. IgG was elevated. CT today (below) is worse. Not sure she needs bronch. She probably needs direct Rx.  Do you mind looking at CT and letting me know. She probably needs direct appt with you I thik. Pleae call me if any questions  Thanks    SIGNATURE    Dr. Kalman Shan, M.D., F.C.C.P,  Pulmonary and Critical Care Medicine Staff Physician, [facility] Director - Interstitial Lung Disease  Program  Pulmonary Fibrosis [facility] at [facility] [place], [place], [address]  Pager: [phone], If no answer or between  15:00h - 7:00h: call [phone] Telephone: [phone]  4:20 PM 03/08/2019     Results for Kimberly Shelton, Kimberly Shelton (MRN [id number]) as of 03/08/2019 16:13  Ref. Range 01/27/2018 11:29  Aspergillus fumigatus (m3)IgG Latest Ref Range: <2.0 mcg/mL 79.4 (H)    Ct Chest Wo Contrast  Result Date: 03/08/2019 CLINICAL DATA:  58 year old female with history of bronchiectasis. EXAM: CT CHEST WITHOUT CONTRAST TECHNIQUE: Multidetector CT imaging of the chest was performed following the standard protocol without IV contrast. COMPARISON:  Chest CT 12/20/2017. FINDINGS: Cardiovascular: Heart size is normal. There is no significant pericardial fluid, thickening or pericardial calcification. There is aortic atherosclerosis, as well as atherosclerosis of the great vessels of the mediastinum and the coronary arteries, including calcified atherosclerotic plaque in the left main and left anterior descending coronary arteries. Mediastinum/Nodes: No pathologically enlarged mediastinal or hilar lymph nodes. Multiple prominent borderline enlarged mediastinal lymph nodes, similar to prior examinations. Small hiatal hernia. No axillary lymphadenopathy. Lungs/Pleura: Widespread areas of cylindrical and varicose bronchiectasis are again noted throughout  the lungs bilaterally. Compared to the prior examination, there has been extensive cavitation in the right upper lobe and to a lesser extent in the superior segment of the right lower lobe where there is a very large thick-walled cavity which contains multiple internal irregular-shaped areas of soft tissue attenuation, highly suspicious for potential aspergilloma (although this may simply represent necrotic lung tissue), largest of which is in the right upper lobe (axial image 28 of series 3 and coronal image 68 of series 5) measuring 3.2 x 2.0 x 3.5 cm. In addition, there are widespread patchy areas of peribronchovascular predominant ground-glass attenuation in the lung bases, most evident in the left lower lobe, new compared to the prior examination. Diffuse bronchial wall thickening with moderate centrilobular and paraseptal emphysema. Upper Abdomen: Aortic atherosclerosis. Musculoskeletal: There are no aggressive appearing lytic or blastic lesions noted in the visualized portions of the skeleton. IMPRESSION: 1. Interval development of extensive fibro cavitary changes in the upper right lung, most evident in the right upper lobe where there are several areas of mass-like and nodular internal soft tissue within these cavities suspicious for potential aspergillomas, as detailed above. 2. There is also new widespread areas of ground-glass attenuation most evident in the lung bases bilaterally. These are nonspecific, but concerning for atypical infection, including viral and fungal etiologies (including angioinvasive aspergillosis). Further clinical evaluation is recommended. 3. Diffuse bronchial wall thickening with moderate centrilobular and paraseptal emphysema; imaging findings suggestive of underlying COPD. 4. Aortic atherosclerosis, in addition to left main and left anterior descending coronary artery disease. Please note that although the presence of coronary artery calcium documents the presence of coronary  artery disease, the severity of this disease and any potential stenosis  cannot be assessed on this non-gated CT examination. Assessment for potential risk factor modification, dietary therapy or pharmacologic therapy may be warranted, if clinically indicated. These results will be called to the ordering clinician or representative by the Radiologist Assistant, and communication documented in the PACS or zVision Dashboard. Aortic Atherosclerosis (ICD10-I70.0) and Emphysema (ICD10-J43.9). Electronically Signed   By: Vinnie Langton M.D.   On: 03/08/2019 10:53

## 2019-03-08 NOTE — Telephone Encounter (Signed)
Call report from [facility] re CT chest:  There is also new widespread areas of ground-glass attenuation most evident in the lung bases bilaterally. These are nonspecific, but concerning for atypical infection, including viral and fungal etiologies (including angioinvasive aspergillosis). Further clinical evaluation is recommended.  Full report available in Epic.  Routed to T. Nils Pyle, NP as app of the day and Dr. Chase Caller (who will be in the office this afternoon)

## 2019-03-08 NOTE — Telephone Encounter (Signed)
LEt [name] know that CT shows more findings of that chronic fungal infection for which she saw Dr Linus Salmons last year.   LEt her kow I have reached out to Dr Linus Salmons  Plan  - please make an appt at [facility] for her  - send note back to me -   THanks    SIGNATURE    Dr. Brand Males, M.D., F.C.C.P,  Pulmonary and Critical Care Medicine Staff Physician, [place] Director - Interstitial Lung Disease  Program  Pulmonary [place] [facility] [place], [place], [address]  Pager: [phone], If no answer or between  15:00h - 7:00h: call [phone] Telephone: [phone]  4:22 PM 03/08/2019

## 2019-03-08 NOTE — Telephone Encounter (Signed)
LMTCB

## 2019-03-08 NOTE — Telephone Encounter (Signed)
Please have Dr. Chase Caller advise on this since he is here this afternoon. Thanks.

## 2019-03-12 NOTE — Telephone Encounter (Signed)
Returned call to patient and made aware pending feedback from MR. Not sure how quickly she will get a response. Nothing further needed until then.

## 2019-03-12 NOTE — Telephone Encounter (Signed)
Called spoke with patient and advised of CT results as stated by Dr Chase Caller.  Patient voiced her understanding but is asking for some additional clarification:  1. Is it worse? 2. Is her body producing bacteria and it's just settling in her lungs?  Referral back to Dr Scharlene Gloss with ID has been placed - pt is aware she will receive a call to schedule appt.  Routing message back to MR as requested by himself and to offer clarification for patient.

## 2019-03-12 NOTE — Telephone Encounter (Signed)
Pt is requesting to speak to nurse about referral. Cb is [phone].

## 2019-03-12 NOTE — Telephone Encounter (Signed)
Patient is returning phone call.  Patient phone number is [phone].

## 2019-03-12 NOTE — Telephone Encounter (Signed)
LMOM TCB x2

## 2019-03-13 DIAGNOSIS — K219 Gastro-esophageal reflux disease without esophagitis: Secondary | ICD-10-CM | POA: Diagnosis not present

## 2019-03-13 DIAGNOSIS — H900 Conductive hearing loss, bilateral: Secondary | ICD-10-CM | POA: Diagnosis not present

## 2019-03-13 DIAGNOSIS — H9203 Otalgia, bilateral: Secondary | ICD-10-CM | POA: Diagnosis not present

## 2019-03-13 DIAGNOSIS — H906 Mixed conductive and sensorineural hearing loss, bilateral: Secondary | ICD-10-CM | POA: Diagnosis not present

## 2019-03-13 DIAGNOSIS — H7291 Unspecified perforation of tympanic membrane, right ear: Secondary | ICD-10-CM | POA: Diagnosis not present

## 2019-03-13 DIAGNOSIS — Z87891 Personal history of nicotine dependence: Secondary | ICD-10-CM | POA: Insufficient documentation

## 2019-03-14 ENCOUNTER — Other Ambulatory Visit: Payer: Self-pay

## 2019-03-14 ENCOUNTER — Ambulatory Visit: Payer: BC Managed Care – PPO | Admitting: Nurse Practitioner

## 2019-03-14 ENCOUNTER — Telehealth: Payer: Self-pay | Admitting: Family Medicine

## 2019-03-14 ENCOUNTER — Encounter: Payer: Self-pay | Admitting: Nurse Practitioner

## 2019-03-14 ENCOUNTER — Ambulatory Visit (INDEPENDENT_AMBULATORY_CARE_PROVIDER_SITE_OTHER): Payer: BC Managed Care – PPO

## 2019-03-14 ENCOUNTER — Telehealth: Payer: Self-pay | Admitting: Internal Medicine

## 2019-03-14 VITALS — BP 138/72 | HR 93 | Temp 98.2°F | Ht 64.0 in | Wt 145.0 lb

## 2019-03-14 DIAGNOSIS — B449 Aspergillosis, unspecified: Secondary | ICD-10-CM

## 2019-03-14 DIAGNOSIS — R042 Hemoptysis: Secondary | ICD-10-CM

## 2019-03-14 DIAGNOSIS — R05 Cough: Secondary | ICD-10-CM | POA: Diagnosis not present

## 2019-03-14 MED ORDER — BENZONATATE 200 MG PO CAPS: 200.0000 mg | ORAL_CAPSULE | Freq: Three times a day (TID) | ORAL | 1 refills | Status: DC | PRN

## 2019-03-14 NOTE — Telephone Encounter (Signed)
Primary Pulmonologist: MR Last office visit and with whom: 08/11/18 with MR What do we see them for (pulmonary problems): bronchiectasis Last OV assessment/plan: Instructions    ICD-10-CM   1. Bronchiectasis without complication ([place]) D17.6   2. History of pneumonia Z87.01   3. History of smoking Z87.891   4. Immunosuppressed status ([place]) D89.9   5. High risk medication use Z79.899   6. Coronary artery calcification seen on CT scan I25.10    Bronchiectasis without complication ([facility]) History of pneumonia History of smoking Immunosuppressed status ([facility])  - stable symptoms today without flare up and pft stable  over time since 2015 - prevnar vaccine 08/11/2018 - repeat CT chest without contrast summer 2020 esp looking at the possible cavity - continue albuterol as needed - Please talk to PCP Eulas Post, MD -  and ensure you get  shingrix ([place]) inactivated vaccine against shingles - continue   Coronary artery calcification seen on CT scan - April 2020  - cardiology was aware of this in April 2019 visit  - you can address this with them at followup   Followup - summer 2020 after CT chest     Was appointment offered to patient (explain)?  Pt wants recommendations   Reason for call: Called and spoke with pt who stated she coughed up mucus that was bloody mucus. Pt stated that the mucus was bright red in color. Pt stated she did this three times. Now pt states when she coughs, the mucus is pink in color.  Pt states that she does have a sore throat. Pt went to ENT 7/7 and told ENT that her throat was sore on the left side and they did not say anything abnormal in regards to her throat.  Pt denies any complaints of fever. Pt also denies any complaints of SOB or chest tightness. Pt has not been tested for COVID. Pt also denies any complaints of headache, no weakness, no loss of smell or diarrhea, no muscle pain or abdominal pain, no nausea or vomiting.  Pt states  that she does use her Proair inhaler every morning as she said this is how she was directed to use it by MR.  Pt is wanting recs to help with her symptoms. Sarah, please advise on this for pt. Thanks!

## 2019-03-14 NOTE — Telephone Encounter (Signed)
Thanks

## 2019-03-14 NOTE — Patient Instructions (Signed)
Recent CT scan showed findings consistent with chronic fungal infection for which she saw Dr Linus Salmons last year. Chest x ray today is unchanged from CT scan. Patient has already been referred to Dr. Linus Salmons in [facility] and has an upcoming appointment scheduled Tuesday  Will order Tessalon perles for cough/comfort  Follow up: Follow up with Dr. Chase Caller in 2-3 months at his first available

## 2019-03-14 NOTE — Telephone Encounter (Signed)
Patient called this morning stating that she woke up this morning coughing up blood. She states that she has been coughing for the past two weeks but this morning blood came up. Patient wanted to get an virtual appointment with Dr. Elease Hashimoto but I did let her know that his schedule was booked today. She prefers to see him. Please advise. Patient phone number is [phone]

## 2019-03-14 NOTE — Telephone Encounter (Signed)
Checked pt's chart in regards to message received from pt and after looking at this, saw that pt has been scheduled an appt with [facility] for 3pm today.nothing further needed.

## 2019-03-14 NOTE — Progress Notes (Signed)
@Patient  ID: , female    DOB: [birth year], 59 y.o.   MRN: [id number]  Chief Complaint  Patient presents with  . Hemoptysis    Chest xray done same day.    Referring provider: [id number], MD  HPI 58 year old female former smoker with bronchiectasis with history of severe pneumococcal pneumonia in the setting of depression due to rheumatoid arthritis 2015 is followed by Dr. 2016.   Tests: CT chest 03/08/19 -  1.Interval development of extensive fibro cavitary changes in the upper right lung, most evident in the right upper lobe where there are several areas of mass-like and nodular internal soft tissue within these cavities suspicious for potential aspergillomas, as detailed above. 2. There is also new widespread areas of ground-glass attenuation most evident in the lung bases bilaterally. These are nonspecific, but concerning for atypical infection, including viral and fungal etiologies (including angioinvasive aspergillosis). Further clinical evaluation is recommended. 3. Diffuse bronchial wall thickening with moderate centrilobular and paraseptal emphysema; imaging findings suggestive of underlying COPD.  CXR 03/14/19 - No change from recent chest CT examination. Fibrosis bilaterally, upper lobe predominant with underlying bronchiectasis. Areas of cavitation are noted in the right upper lobe, better delineated by CT. No new opacity is evident compared to recent CT. Stable cardiac silhouette. Note that there has been progression disease since 2019.  Note that the current findings are likely due to likely aspergillosis superinfection in areas of underlying fibrosis.  OV 03/14/19 - Cough with blood tinged sputum Patient presents today for an acute visit with hemoptysis this a.m.  Patient states that when she woke up this morning she was coughing and noticed blood-tinged sputum.  Throughout the day she has had a productive cough but states that she has  noticed less blood and sputum is only pink-tinged now.  Patient had a CT scan in spring 2019 which suggested possible aspergillus cavity.  Dr. 03-29-1998 order Aspergillus antibody and she was positive for IgG and was referred to infectious disease.  She had a repeat CT scan on 03/08/2019 which was consistent with chronic fungal infection and chest x-ray today was unchanged from previous CT scan and again demonstrated that findings are likely due to aspergillosis superinfection.  Dr. 05/09/2019 has already ordered a referral back to infectious disease and patient has an upcoming appointment on Tuesday. Denies f/c/s, n/v/d, PND, leg swelling.      Allergies  Allergen Reactions  . Bee Venom Anaphylaxis  . Codeine Other (See Comments)    "seeing spots"  . Penicillins Rash    Has patient had a PCN reaction causing immediate rash, facial/tongue/throat swelling, SOB or lightheadedness with hypotension: No Has patient had a PCN reaction causing severe rash involving mucus membranes or skin necrosis: No Has patient had a PCN reaction that required hospitalization No Has patient had a PCN reaction occurring within the last 10 years: Yes If all of the above answers are "NO", then may proceed with Cephalosporin use.     Immunization History  Administered Date(s) Administered  . Influenza,inj,Quad PF,6+ Mos 06/25/2015, 05/31/2017, 05/29/2018  . Influenza-Unspecified 06/06/2014  . Pneumococcal Conjugate-13 08/11/2018  . Pneumococcal Polysaccharide-23 02/18/2014  . Tdap 09/01/2016    Past Medical History:  Diagnosis Date  . Abnormal Pap smear 09/2006   HGSIL CIN 2/VAIN/ CIN 3/VAIN-3 CIS  . CHF (congestive heart failure) (HCC)   . Foot fracture, left 10/16   hair-line fracture  . Pneumonia    was hospitalized  . RA (rheumatoid arthritis) (HCC)  07/2009    Tobacco History: Social History   Tobacco Use  Smoking Status Former Smoker  . Packs/day: 1.00  . Years: 30.00  . Pack years: 30.00  .  Types: Cigarettes, E-cigarettes  . Quit date: 02/16/2014  . Years since quitting: 5.0  Smokeless Tobacco Never Used   Counseling given: Not Answered   Outpatient Encounter Medications as of 03/14/2019  Medication Sig  . escitalopram (LEXAPRO) 10 MG tablet TAKE 1 TABLET BY MOUTH EVERY DAY  . leflunomide (ARAVA) 20 MG tablet Take 20 mg by mouth daily.  Marland Kitchen levofloxacin (LEVAQUIN) 500 MG tablet Take 1 tablet (500 mg total) by mouth daily.  Marland Kitchen PROAIR HFA 108 (90 Base) MCG/ACT inhaler IHALE 2 PUFFS INTO THE LUNGS EVERY 6 HOURS AS NEEDED FOR WHEEZING OR SHORTNESS OF BREATH  . tocilizumab (ACTEMRA) 400 MG/20ML SOLN injection Inject into the vein once. Infusion once every 30 days  . benzonatate (TESSALON) 200 MG capsule Take 1 capsule (200 mg total) by mouth 3 (three) times daily as needed for cough.   No facility-administered encounter medications on file as of 03/14/2019.      Review of Systems  Review of Systems  Constitutional: Negative.  Negative for chills and fever.  HENT: Positive for sore throat.   Respiratory: Positive for cough (productive of blood tinged sputum) and shortness of breath. Negative for wheezing.   Cardiovascular: Negative.  Negative for chest pain, palpitations and leg swelling.  Gastrointestinal: Negative.   Allergic/Immunologic: Negative.   Neurological: Negative.   Psychiatric/Behavioral: Negative.        Physical Exam  BP 138/72 (BP Location: Left Arm, Patient Position: Sitting, Cuff Size: Normal)   Pulse 93   Temp 98.2 F (36.8 C)   Ht 5\' 4"  (1.626 m)   Wt 145 lb (65.8 kg)   LMP 09/06/2004   SpO2 95%   BMI 24.89 kg/m   Wt Readings from Last 5 Encounters:  03/14/19 145 lb (65.8 kg)  01/01/19 145 lb 6.4 oz (66 kg)  10/23/18 141 lb 12.8 oz (64.3 kg)  08/11/18 146 lb (66.2 kg)  06/02/18 146 lb 14.4 oz (66.6 kg)     Physical Exam Vitals signs and nursing note reviewed.  Constitutional:      General: She is not in acute distress.    Appearance:  She is well-developed.  Cardiovascular:     Rate and Rhythm: Normal rate and regular rhythm.  Pulmonary:     Effort: Pulmonary effort is normal. No respiratory distress.     Breath sounds: Normal breath sounds. No wheezing or rhonchi.  Musculoskeletal:        General: No swelling.  Neurological:     Mental Status: She is alert and oriented to person, place, and time.      Imaging: Dg Chest 2 View  Result Date: 03/14/2019 CLINICAL DATA:  Hemoptysis with cough EXAM: CHEST - 2 VIEW COMPARISON:  Chest radiograph December 08, 2017 and chest CT March 08, 2019 FINDINGS: Extensive areas of bronchiectasis noted, unchanged from recent CT. Several cavitary regions are noted in the right upper lobe, stable. Elsewhere, there are areas of scarring and fibrosis with upper lobe volume loss, unchanged from recent CT. No new opacity is evident compared to recent CT. Heart size and pulmonary vascularity are normal. No adenopathy. No bone lesions. IMPRESSION: No change from recent chest CT examination. Fibrosis bilaterally, upper lobe predominant with underlying bronchiectasis. Areas of cavitation are noted in the right upper lobe, better delineated by CT. No  new opacity is evident compared to recent CT. Stable cardiac silhouette. Note that there has been progression disease since 2019. Note that the current findings are likely due to likely aspergillosis superinfection in areas of underlying fibrosis. Electronically Signed   By: Bretta BangWilliam  Woodruff III M.D.   On: 03/14/2019 15:02   Ct Chest Wo Contrast  Result Date: 03/08/2019 CLINICAL DATA:  58 year old female with history of bronchiectasis. EXAM: CT CHEST WITHOUT CONTRAST TECHNIQUE: Multidetector CT imaging of the chest was performed following the standard protocol without IV contrast. COMPARISON:  Chest CT 12/20/2017. FINDINGS: Cardiovascular: Heart size is normal. There is no significant pericardial fluid, thickening or pericardial calcification. There is aortic  atherosclerosis, as well as atherosclerosis of the great vessels of the mediastinum and the coronary arteries, including calcified atherosclerotic plaque in the left main and left anterior descending coronary arteries. Mediastinum/Nodes: No pathologically enlarged mediastinal or hilar lymph nodes. Multiple prominent borderline enlarged mediastinal lymph nodes, similar to prior examinations. Small hiatal hernia. No axillary lymphadenopathy. Lungs/Pleura: Widespread areas of cylindrical and varicose bronchiectasis are again noted throughout the lungs bilaterally. Compared to the prior examination, there has been extensive cavitation in the right upper lobe and to a lesser extent in the superior segment of the right lower lobe where there is a very large thick-walled cavity which contains multiple internal irregular-shaped areas of soft tissue attenuation, highly suspicious for potential aspergilloma (although this may simply represent necrotic lung tissue), largest of which is in the right upper lobe (axial image 28 of series 3 and coronal image 68 of series 5) measuring 3.2 x 2.0 x 3.5 cm. In addition, there are widespread patchy areas of peribronchovascular predominant ground-glass attenuation in the lung bases, most evident in the left lower lobe, new compared to the prior examination. Diffuse bronchial wall thickening with moderate centrilobular and paraseptal emphysema. Upper Abdomen: Aortic atherosclerosis. Musculoskeletal: There are no aggressive appearing lytic or blastic lesions noted in the visualized portions of the skeleton. IMPRESSION: 1. Interval development of extensive fibro cavitary changes in the upper right lung, most evident in the right upper lobe where there are several areas of mass-like and nodular internal soft tissue within these cavities suspicious for potential aspergillomas, as detailed above. 2. There is also new widespread areas of ground-glass attenuation most evident in the lung bases  bilaterally. These are nonspecific, but concerning for atypical infection, including viral and fungal etiologies (including angioinvasive aspergillosis). Further clinical evaluation is recommended. 3. Diffuse bronchial wall thickening with moderate centrilobular and paraseptal emphysema; imaging findings suggestive of underlying COPD. 4. Aortic atherosclerosis, in addition to left main and left anterior descending coronary artery disease. Please note that although the presence of coronary artery calcium documents the presence of coronary artery disease, the severity of this disease and any potential stenosis cannot be assessed on this non-gated CT examination. Assessment for potential risk factor modification, dietary therapy or pharmacologic therapy may be warranted, if clinically indicated. These results will be called to the ordering clinician or representative by the Radiologist Assistant, and communication documented in the PACS or zVision Dashboard. Aortic Atherosclerosis (ICD10-I70.0) and Emphysema (ICD10-J43.9). Electronically Signed   By: Trudie Reedaniel  Entrikin M.D.   On: 03/08/2019 10:53     Assessment & Plan:   Aspergillosis [facility](HCC) Patient presents today for an acute visit with hemoptysis this a.m.  Patient states that when she woke up this morning she was coughing and noticed blood-tinged sputum.  Throughout the day she has had a productive cough but states that she  has noticed less blood and sputum is only pink-tinged now.  Patient had a CT scan in spring 2019 which suggested possible aspergillus cavity.  Dr. Marchelle Gearingamaswamy order Aspergillus antibody and she was positive for IgG and was referred to infectious disease.  She had a repeat CT scan on 03/08/2019 which was consistent with chronic fungal infection and chest x-ray today was unchanged from previous CT scan and again demonstrated that findings are likely due to aspergillosis superinfection.  Dr. Marchelle Gearingamaswamy has already ordered a referral back to  infectious disease and patient has an upcoming appointment on Tuesday.  Plan: Patient Instructions  Recent CT scan showed findings consistent with chronic fungal infection for which she saw Dr Luciana Axeomer last year. Chest x ray today is unchanged from CT scan. Patient has already been referred to Dr. Luciana Axeomer in ID and has an upcoming appointment scheduled Tuesday  Will order Tessalon perles for cough/comfort  Follow up: Follow up with Dr. Marchelle Gearingamaswamy in 2-3 months at his first available          Ivonne Andrewonya S Jamen Loiseau, NP 03/14/2019

## 2019-03-14 NOTE — Assessment & Plan Note (Signed)
Patient presents today for an acute visit with hemoptysis this a.m.  Patient states that when she woke up this morning she was coughing and noticed blood-tinged sputum.  Throughout the day she has had a productive cough but states that she has noticed less blood and sputum is only pink-tinged now.  Patient had a CT scan in spring 2019 which suggested possible aspergillus cavity.  Dr. Chase Caller order Aspergillus antibody and she was positive for IgG and was referred to infectious disease.  She had a repeat CT scan on 03/08/2019 which was consistent with chronic fungal infection and chest x-ray today was unchanged from previous CT scan and again demonstrated that findings are likely due to aspergillosis superinfection.  Dr. Chase Caller has already ordered a referral back to infectious disease and patient has an upcoming appointment on Tuesday.  Plan: Patient Instructions  Recent CT scan showed findings consistent with chronic fungal infection for which she saw Dr Linus Salmons last year. Chest x ray today is unchanged from CT scan. Patient has already been referred to Dr. Linus Salmons in ID and has an upcoming appointment scheduled Tuesday  Will order Tessalon perles for cough/comfort  Follow up: Follow up with Dr. Chase Caller in 2-3 months at his first available

## 2019-03-14 NOTE — Telephone Encounter (Signed)
Spoke with patient. Informed her of Dr. Elease Hashimoto message. She verbalizes understanding and that she is waiting  on Dr. Ramaswamy(pulmonologist) office to call her back. Patient reports she will call back if she doesn't hear from them by 3:30 PM

## 2019-03-14 NOTE — Telephone Encounter (Signed)
Clinic RN spoke with patient. Patient reports she has called her "lung doctor" and is awaiting a call from them. Patient verbalizes she felt Dr. Elease Hashimoto would refer her back to him.

## 2019-03-14 NOTE — Telephone Encounter (Signed)
We can still do virtual visit after 4 PM if she would like.  Sounds like she will need minimally CXR to further evaluate.

## 2019-03-20 ENCOUNTER — Encounter: Payer: Self-pay | Admitting: Internal Medicine

## 2019-03-20 ENCOUNTER — Telehealth: Payer: Self-pay | Admitting: Pharmacy Technician

## 2019-03-20 ENCOUNTER — Other Ambulatory Visit: Payer: Self-pay

## 2019-03-20 ENCOUNTER — Ambulatory Visit: Payer: BC Managed Care – PPO | Admitting: Internal Medicine

## 2019-03-20 DIAGNOSIS — J984 Other disorders of lung: Secondary | ICD-10-CM | POA: Diagnosis not present

## 2019-03-20 DIAGNOSIS — B449 Aspergillosis, unspecified: Secondary | ICD-10-CM

## 2019-03-20 DIAGNOSIS — Z5181 Encounter for therapeutic drug level monitoring: Secondary | ICD-10-CM | POA: Diagnosis not present

## 2019-03-20 MED ORDER — VORICONAZOLE 200 MG PO TABS: 200.0000 mg | ORAL_TABLET | Freq: Two times a day (BID) | ORAL | 2 refills | Status: DC

## 2019-03-20 NOTE — Assessment & Plan Note (Signed)
Ab positive but unclear if the true pathogen.  Nothing else positive.  Will need to repeat the CT and if no improvement consider other diagnoses and may need a BAL then.

## 2019-03-20 NOTE — Telephone Encounter (Addendum)
[facility] Patient Advocate Encounter   Received notification from [facility] that prior authorization for Vfend 200 mg is required.   PA submitted on 03/20/2019 Key [id number] Status is pending [id number] determination within 3 days    [facility] will continue to follow.  Bartholomew Crews, CPhT Specialty Pharmacy Patient [facility] for Infectious Disease Phone: [phone] Fax: [phone] 03/20/2019 11:03 AM

## 2019-03-20 NOTE — Telephone Encounter (Signed)
[facility] Patient Advocate Encounter  Prior Authorization for Vfend has been approved.    Effective dates: 03/20/2019 through 09/15/2019  Patients co-pay is $235.18.   Will call [facility] to alert approval and [facility] will continue to follow if any further assistance is needed.  Bartholomew Crews, CPhT Specialty Pharmacy Patient [facility] for Infectious Disease Phone: [phone] Fax: [phone] 03/20/2019 2:07 PM

## 2019-03-20 NOTE — Progress Notes (Signed)
   Subjective:    Patient ID: ELY SPRAGG, female    DOB: 1960-09-16, 58 y.o.   MRN: [id number]  HPI Here for follow up at the request of Dr. Chase Caller for pulmonary cavitary lesions.  I saw her last year and noted bronchiectasis and had a positive aspergillus Ig positive with negative Ag and no growth on sputum culture.  She had been relatively asymptomatic.  She more recently though has developed hemoptysis and a follow upCT scan now has evolved to cavitary areas, widespread ground-glass attenuation and known COPD and sent for reevaluation.  Along with hemoptysis, she has noted more productive cough, particularly in the am.  No weight loss, no night sweats.  She is here with her sister. She endorses vaping recently though has stopped with these recent findings.  No associated fever.    Review of Systems  Constitutional: Negative for chills, fatigue and fever.  Respiratory: Positive for cough. Negative for shortness of breath, wheezing and stridor.   Skin: Negative for rash.       Objective:   Physical Exam Constitutional:      Appearance: Normal appearance.  Eyes:     General: No scleral icterus. Cardiovascular:     Rate and Rhythm: Normal rate and regular rhythm.     Heart sounds: No murmur.  Pulmonary:     Effort: Pulmonary effort is normal. No respiratory distress.  Skin:    Findings: No rash.  Neurological:     Mental Status: She is alert.    SH: + vaping but stopped       Assessment & Plan:

## 2019-03-20 NOTE — Assessment & Plan Note (Signed)
Worsening disease with CT findings.  I will start her on voriconazole and follow up in about 4 weeks.  Side effects discussed.  Will need at least 3 months.  Will consider new CT in about 5-6 weeks.

## 2019-03-20 NOTE — Telephone Encounter (Signed)
[facility] Patient Advocate Encounter  Patient has been approved for copay assistance with [employer] ([employer]).  [facility] will cover all copayment expenses for Vfend from 03/20/2019 to 04/19/2019.  I have spoken with the patient and they will pick up medication from [facility].  The billing information is as follows  Member ID: [id number] RxBin: [id number] PCN: [id number] Group: [id number]  Patient knows to call the office with questions or concerns.  Bartholomew Crews, CPhT Specialty Pharmacy Patient [place] for Infectious Disease Phone: [phone] Fax: [phone] 03/20/2019 3:11 PM

## 2019-03-20 NOTE — Assessment & Plan Note (Signed)
Side effects discussed

## 2019-03-26 NOTE — Telephone Encounter (Signed)
Patient has already seen Dr Linus Salmons on 7.14.2020 and has her next follow up scheduled 8.17.2020.  Called spoke with patient to ensure that Dr Linus Salmons answered her questions.  Patient stated that yes, Dr Linus Salmons did answer her questions and that she spoke with Dr Chase Caller personally about 30 minutes ago who answered additional questions.  Per patient MR would like her to follow up in 6 months.  Recall placed Nothing further needed; will sign off.

## 2019-03-26 NOTE — Telephone Encounter (Signed)
Please let Kimberly Shelton know that she needs to make that ID appt ASAP - if there is delay with Dr Linus Salmons she can go to DR Drucilla Schmidt - ASAP   Appt needed next few weeks  ID services can explain

## 2019-04-13 NOTE — Telephone Encounter (Signed)
[facility] Patient Advocate Encounter  Called [facility] [place] and requested patient's refill of Vfend to be ordered and ready for pick up by Tuesday 08/11 in order to get one more refill before the assistance deadline. [employer] has reached out to the patient and had her fill out the appropriate documents to get approved for the September refill of her medication. She has a follow-up appointment on 08/17 and would like to speak to Dr. Linus Salmons about CT scan and communicating results with Dr. Amil Amen.

## 2019-04-20 ENCOUNTER — Other Ambulatory Visit: Payer: Self-pay

## 2019-04-20 ENCOUNTER — Ambulatory Visit
Admission: RE | Admit: 2019-04-20 | Discharge: 2019-04-20 | Disposition: A | Discharge status: Home / Self Care | Payer: BC Managed Care – PPO | Source: Ambulatory Visit | Attending: Obstetrics & Gynecology | Admitting: Obstetrics & Gynecology

## 2019-04-20 DIAGNOSIS — Z1231 Encounter for screening mammogram for malignant neoplasm of breast: Secondary | ICD-10-CM

## 2019-04-23 ENCOUNTER — Ambulatory Visit
Admission: RE | Admit: 2019-04-23 | Discharge: 2019-04-23 | Disposition: A | Discharge status: Home / Self Care | Payer: BC Managed Care – PPO | Source: Ambulatory Visit | Attending: Internal Medicine | Admitting: Internal Medicine

## 2019-04-23 ENCOUNTER — Ambulatory Visit: Payer: BC Managed Care – PPO | Admitting: Internal Medicine

## 2019-04-23 ENCOUNTER — Encounter: Payer: Self-pay | Admitting: Internal Medicine

## 2019-04-23 ENCOUNTER — Other Ambulatory Visit: Payer: Self-pay

## 2019-04-23 VITALS — BP 145/76 | HR 80 | Temp 98.2°F

## 2019-04-23 DIAGNOSIS — B449 Aspergillosis, unspecified: Secondary | ICD-10-CM | POA: Diagnosis not present

## 2019-04-23 DIAGNOSIS — Z5181 Encounter for therapeutic drug level monitoring: Secondary | ICD-10-CM | POA: Diagnosis not present

## 2019-04-23 DIAGNOSIS — J181 Lobar pneumonia, unspecified organism: Secondary | ICD-10-CM | POA: Diagnosis not present

## 2019-04-23 DIAGNOSIS — M069 Rheumatoid arthritis, unspecified: Secondary | ICD-10-CM | POA: Diagnosis not present

## 2019-04-23 DIAGNOSIS — J984 Other disorders of lung: Secondary | ICD-10-CM

## 2019-04-23 NOTE — Assessment & Plan Note (Addendum)
CXR with minimal changes.  Will check a CT next week for comparison.

## 2019-04-23 NOTE — Assessment & Plan Note (Signed)
Dr. Amil Amen is holding on tocilizumab for now during treatment/infection with possible aspergillus.

## 2019-04-23 NOTE — Assessment & Plan Note (Signed)
Will check a CMP today 

## 2019-04-23 NOTE — Assessment & Plan Note (Signed)
Tolerating voriconazole and will plan to continue at least another one month.  If no improvement on CXR/CT, may need to consider BAL and alternative diagnosis.

## 2019-04-23 NOTE — Progress Notes (Signed)
Patient ID: Kimberly Shelton, female   DOB: March 25, 1961, 58 y.o.   MRN: [id number]   Subjective:    Patient ID: Kimberly Shelton, female    DOB: September 05, 1961, 58 y.o.   MRN: [id number]  HPI Here for follow up for pulmonary cavitary lesions.  I saw her last year and noted bronchiectasis and had a positive aspergillus Ig positive with negative Ag and no growth on sputum culture.  She had been relatively asymptomatic.  She more recently though has developed hemoptysis and a follow up CT scan now has evolved to cavitary areas, widespread ground-glass attenuation and known COPD and sent for reevaluation last month.  Along with hemoptysis, she has noted more productive cough, particularly in the am.  No weight loss, no night sweats.  She is here again with her sister. She endorses vaping recently and has done it a couple of times since her visit last month.  With the positive aspergillus antibody and cavitary lesions with some radiographic suggestion of aspergilous infection, I started her on voriconazole, which she has been on over 1 month.  Tolerating well with some visual disturbances that last about 30 minutes.  Feels her cough is a bit better and better energy.      Review of Systems  Constitutional: Negative for chills, fatigue and fever.  Respiratory: Positive for cough. Negative for shortness of breath, wheezing and stridor.   Skin: Negative for rash.       Objective:   Physical Exam Constitutional:      Appearance: Normal appearance.  Eyes:     General: No scleral icterus. Cardiovascular:     Rate and Rhythm: Normal rate and regular rhythm.     Heart sounds: No murmur.  Pulmonary:     Effort: Pulmonary effort is normal. No respiratory distress.  Skin:    Findings: No rash.  Neurological:     Mental Status: She is alert.    SH: + vaping but much reduced       Assessment & Plan:

## 2019-04-24 LAB — COMPREHENSIVE METABOLIC PANEL
AG Ratio: 1.4 (calc) (ref 1.0–2.5)
ALT: 8 U/L (ref 6–29)
AST: 16 U/L (ref 10–35)
Albumin: 3.6 g/dL (ref 3.6–5.1)
Alkaline phosphatase (APISO): 221 U/L — ABNORMAL HIGH (ref 37–153)
BUN: 9 mg/dL (ref 7–25)
CO2: 29 mmol/L (ref 20–32)
Calcium: 9.1 mg/dL (ref 8.6–10.4)
Chloride: 106 mmol/L (ref 98–110)
Creat: 0.57 mg/dL (ref 0.50–1.05)
Globulin: 2.5 g/dL (calc) (ref 1.9–3.7)
Glucose, Bld: 81 mg/dL (ref 65–99)
Potassium: 4.9 mmol/L (ref 3.5–5.3)
Sodium: 142 mmol/L (ref 135–146)
Total Bilirubin: 0.3 mg/dL (ref 0.2–1.2)
Total Protein: 6.1 g/dL (ref 6.1–8.1)

## 2019-04-24 LAB — CBC WITH DIFFERENTIAL/PLATELET
Absolute Monocytes: 896 cells/uL (ref 200–950)
Basophils Absolute: 113 cells/uL (ref 0–200)
Basophils Relative: 1.3 %
Eosinophils Absolute: 400 cells/uL (ref 15–500)
Eosinophils Relative: 4.6 %
HCT: 33 % — ABNORMAL LOW (ref 35.0–45.0)
Hemoglobin: 10.8 g/dL — ABNORMAL LOW (ref 11.7–15.5)
Lymphs Abs: 1296 cells/uL (ref 850–3900)
MCH: 28.3 pg (ref 27.0–33.0)
MCHC: 32.7 g/dL (ref 32.0–36.0)
MCV: 86.6 fL (ref 80.0–100.0)
MPV: 9.7 fL (ref 7.5–12.5)
Monocytes Relative: 10.3 %
Neutro Abs: 5994 cells/uL (ref 1500–7800)
Neutrophils Relative %: 68.9 %
Platelets: 395 10*3/uL (ref 140–400)
RBC: 3.81 10*6/uL (ref 3.80–5.10)
RDW: 12.7 % (ref 11.0–15.0)
Total Lymphocyte: 14.9 %
WBC: 8.7 10*3/uL (ref 3.8–10.8)

## 2019-04-25 DIAGNOSIS — Z79899 Other long term (current) drug therapy: Secondary | ICD-10-CM | POA: Diagnosis not present

## 2019-04-25 DIAGNOSIS — G5602 Carpal tunnel syndrome, left upper limb: Secondary | ICD-10-CM | POA: Diagnosis not present

## 2019-04-25 DIAGNOSIS — M0589 Other rheumatoid arthritis with rheumatoid factor of multiple sites: Secondary | ICD-10-CM | POA: Diagnosis not present

## 2019-04-25 DIAGNOSIS — M150 Primary generalized (osteo)arthritis: Secondary | ICD-10-CM | POA: Diagnosis not present

## 2019-05-01 ENCOUNTER — Other Ambulatory Visit: Payer: Self-pay

## 2019-05-02 ENCOUNTER — Ambulatory Visit
Admission: RE | Admit: 2019-05-02 | Discharge: 2019-05-02 | Disposition: A | Discharge status: Home / Self Care | Payer: BC Managed Care – PPO | Source: Ambulatory Visit | Attending: Internal Medicine | Admitting: Internal Medicine

## 2019-05-02 DIAGNOSIS — J432 Centrilobular emphysema: Secondary | ICD-10-CM | POA: Diagnosis not present

## 2019-05-02 DIAGNOSIS — R918 Other nonspecific abnormal finding of lung field: Secondary | ICD-10-CM | POA: Diagnosis not present

## 2019-05-02 DIAGNOSIS — J984 Other disorders of lung: Secondary | ICD-10-CM

## 2019-05-02 DIAGNOSIS — B449 Aspergillosis, unspecified: Secondary | ICD-10-CM

## 2019-05-02 DIAGNOSIS — J479 Bronchiectasis, uncomplicated: Secondary | ICD-10-CM | POA: Diagnosis not present

## 2019-05-03 ENCOUNTER — Other Ambulatory Visit: Payer: Self-pay

## 2019-05-03 ENCOUNTER — Ambulatory Visit: Payer: BC Managed Care – PPO | Admitting: Obstetrics & Gynecology

## 2019-05-03 ENCOUNTER — Telehealth: Payer: Self-pay

## 2019-05-03 ENCOUNTER — Encounter: Payer: Self-pay | Admitting: Obstetrics & Gynecology

## 2019-05-03 ENCOUNTER — Other Ambulatory Visit (HOSPITAL_COMMUNITY)
Admission: RE | Admit: 2019-05-03 | Discharge: 2019-05-03 | Disposition: A | Discharge status: Home / Self Care | Payer: BC Managed Care – PPO | Source: Ambulatory Visit | Attending: Obstetrics & Gynecology | Admitting: Obstetrics & Gynecology

## 2019-05-03 VITALS — BP 146/80 | HR 102 | Temp 97.3°F | Ht 63.0 in | Wt 137.8 lb

## 2019-05-03 DIAGNOSIS — R87610 Atypical squamous cells of undetermined significance on cytologic smear of cervix (ASC-US): Secondary | ICD-10-CM

## 2019-05-03 DIAGNOSIS — R87810 Cervical high risk human papillomavirus (HPV) DNA test positive: Secondary | ICD-10-CM

## 2019-05-03 DIAGNOSIS — Z01419 Encounter for gynecological examination (general) (routine) without abnormal findings: Secondary | ICD-10-CM

## 2019-05-03 DIAGNOSIS — Z124 Encounter for screening for malignant neoplasm of cervix: Secondary | ICD-10-CM | POA: Diagnosis not present

## 2019-05-03 NOTE — Telephone Encounter (Signed)
-----   Message from Thayer Headings, MD sent at 05/03/2019  8:49 AM EDT ----- Please let her know her CT shows good improvement in her lung findings so the treatment seems to be on the right track.  She should continue to take the voriconazole. thanks

## 2019-05-03 NOTE — Progress Notes (Signed)
58 y.o. G1P1 Divorced White or Caucasian female here for annual exam.  Having issues with ear due to perforated tympanic membrane.  Being followed by ENT at this time.  She had bronchiectasis last year and was diagnosed with aspergillus Ig.  She is on antibiotic for 3 months.    She is being followed by Dr. Dierdre Forth, rheumatologist, who took her off her biologic infusion.  She is now on prednisone.    Denies vaginal bleeding.    Still working on disability.  Not currently SA.  Patient's last menstrual period was 09/06/2004.          Sexually active: No.  The current method of family planning is post menopausal status.    Exercising: Yes.     Smoker:  no  Health Maintenance: Pap:  11/24/17 ASCUS. HR HPV:neg. ECC neg   11/19/16 Neg. HR HPV:neg  History of abnormal Pap:  yes MMG:  04/20/19 BIRADS1:neg  Colonoscopy:  2016 f/u 10 years  BMD:   06/21/14  TDaP:  2017 Pneumonia vaccine(s):  2019 Shingrix:  No Hep C testing: 11/19/16 neg Screening Labs: PCP   reports that she quit smoking about 5 years ago. Her smoking use included cigarettes and e-cigarettes. She has a 30.00 pack-year smoking history. She has never used smokeless tobacco. She reports that she does not drink alcohol or use drugs.  Past Medical History:  Diagnosis Date  . Abnormal Pap smear 09/2006   HGSIL CIN 2/VAIN/ CIN 3/VAIN-3 CIS  . CHF (congestive heart failure) (HCC)   . Foot fracture, left 10/16   hair-line fracture  . Pneumonia    was hospitalized  . RA (rheumatoid arthritis) (HCC) 07/2009    Past Surgical History:  Procedure Laterality Date  . CERVICAL BIOPSY  W/ LOOP ELECTRODE EXCISION  2008   CIN 3  . CERVICAL CONIZATION W/BX N/A 12/15/2015   Procedure: CONIZATION CERVIX WITH BIOPSY ;  Surgeon: Jerene Bears, MD;  Location: [facility];  Service: Gynecology;  Laterality: N/A;  POSSIBLE COLD KNIFE CONE.  Please have the microscope in the room  . COLONOSCOPY    . LEEP N/A 12/15/2015   Procedure: LOOP  ELECTROSURGICAL EXCISION PROCEDURE (LEEP) with colpo;  Surgeon: Jerene Bears, MD;  Location: [facility];  Service: Gynecology;  Laterality: N/A;  . PILONIDAL CYST EXCISION  1999   I&D  . WISDOM TOOTH EXTRACTION      Current Outpatient Medications  Medication Sig Dispense Refill  . escitalopram (LEXAPRO) 10 MG tablet TAKE 1 TABLET BY MOUTH EVERY DAY 30 tablet 2  . predniSONE (DELTASONE) 10 MG tablet Take 10 mg by mouth daily with breakfast.    . PROAIR HFA 108 (90 Base) MCG/ACT inhaler IHALE 2 PUFFS INTO THE LUNGS EVERY 6 HOURS AS NEEDED FOR WHEEZING OR SHORTNESS OF BREATH 8.5 g 3  . voriconazole (VFEND) 200 MG tablet Take 1 tablet (200 mg total) by mouth 2 (two) times daily. 60 tablet 2  . tocilizumab (ACTEMRA) 400 MG/20ML SOLN injection Inject into the vein once. Infusion once every 30 days     No current facility-administered medications for this visit.     Family History  Problem Relation Age of Onset  . Diabetes Mother   . Rheum arthritis Mother   . Diabetes Father   . COPD Father   . Bronchitis Father   . Cancer Brother        Lung  . Breast cancer Maternal Aunt 53  . CAD Neg Hx  Review of Systems  All other systems reviewed and are negative.   Exam:   BP (!) 146/80   Pulse (!) 102   Temp (!) 97.3 F (36.3 C) (Temporal)   Ht 5\' 3"  (1.6 m)   Wt 137 lb 12.8 oz (62.5 kg)   LMP 09/06/2004   BMI 24.41 kg/m     Height: 5\' 3"  (160 cm)  Ht Readings from Last 3 Encounters:  05/03/19 5\' 3"  (1.6 m)  03/14/19 5\' 4"  (1.626 m)  01/01/19 5\' 3"  (1.6 m)    General appearance: alert, cooperative and appears stated age Head: Normocephalic, without obvious abnormality, atraumatic Neck: no adenopathy, supple, symmetrical, trachea midline and thyroid normal to inspection and palpation Lungs: clear to auscultation bilaterally Breasts: normal appearance, no masses or tenderness Heart: regular rate and rhythm Abdomen: soft, non-tender; bowel sounds normal; no masses,  no  organomegaly Extremities: extremities normal, atraumatic, no cyanosis or edema Skin: Skin color, texture, turgor normal. No rashes or lesions Lymph nodes: Cervical, supraclavicular, and axillary nodes normal. No abnormal inguinal nodes palpated Neurologic: Grossly normal   Pelvic: External genitalia:  no lesions              Urethra:  normal appearing urethra with no masses, tenderness or lesions              Bartholins and Skenes: normal                 Vagina: normal appearing vagina with normal color and discharge, no lesions              Cervix: no lesions              Pap taken: Yes.   Bimanual Exam:  Uterus:  normal size, contour, position, consistency, mobility, non-tender              Adnexa: normal adnexa and no mass, fullness, tenderness               Rectovaginal: Confirms               Anus:  normal sphincter tone, no lesions  Chaperone was present for exam.  A:  Well Woman with normal exam PMP, no HRT H/o CIN 2 with LEEP 2008, then again 4/17 with 2/3 RA, on prednisone now Aspergillus in lungs, on antifungal medication for 3 months  P:   Mammogram guidelines reviewed  pap smear with HR HPV obtained today Colonoscopy UTD BMD due with next MMG Lab work PCP, pulmonologist and rheumatologist Shingrix vaccination discussed again today Return annually or prn

## 2019-05-03 NOTE — Telephone Encounter (Signed)
Patient called. Left HIPPA compliant voicemail and made aware.  Kimberly Shelton

## 2019-05-03 NOTE — Telephone Encounter (Signed)
Patient returned call and gave her the results of her recent scan. She was pleased and I reminded her of her up coming appointment.

## 2019-05-08 LAB — CYTOLOGY - PAP
Diagnosis: NEGATIVE
HPV: NOT DETECTED

## 2019-05-19 ENCOUNTER — Other Ambulatory Visit: Payer: Self-pay | Admitting: Obstetrics & Gynecology

## 2019-05-21 ENCOUNTER — Encounter: Payer: Self-pay | Admitting: Internal Medicine

## 2019-05-21 ENCOUNTER — Other Ambulatory Visit: Payer: Self-pay

## 2019-05-21 ENCOUNTER — Ambulatory Visit: Payer: BC Managed Care – PPO | Admitting: Internal Medicine

## 2019-05-21 ENCOUNTER — Ambulatory Visit
Admission: RE | Admit: 2019-05-21 | Discharge: 2019-05-21 | Disposition: A | Discharge status: Home / Self Care | Payer: BC Managed Care – PPO | Source: Ambulatory Visit | Attending: Internal Medicine | Admitting: Internal Medicine

## 2019-05-21 VITALS — BP 122/64 | HR 71 | Temp 97.8°F

## 2019-05-21 DIAGNOSIS — J984 Other disorders of lung: Secondary | ICD-10-CM

## 2019-05-21 DIAGNOSIS — Z87891 Personal history of nicotine dependence: Secondary | ICD-10-CM | POA: Diagnosis not present

## 2019-05-21 DIAGNOSIS — Z5181 Encounter for therapeutic drug level monitoring: Secondary | ICD-10-CM

## 2019-05-21 DIAGNOSIS — B449 Aspergillosis, unspecified: Secondary | ICD-10-CM

## 2019-05-21 DIAGNOSIS — M069 Rheumatoid arthritis, unspecified: Secondary | ICD-10-CM

## 2019-05-21 DIAGNOSIS — R918 Other nonspecific abnormal finding of lung field: Secondary | ICD-10-CM | POA: Diagnosis not present

## 2019-05-21 LAB — COMPREHENSIVE METABOLIC PANEL
AG Ratio: 1.5 (calc) (ref 1.0–2.5)
ALT: 16 U/L (ref 6–29)
AST: 17 U/L (ref 10–35)
Albumin: 3.8 g/dL (ref 3.6–5.1)
Alkaline phosphatase (APISO): 161 U/L — ABNORMAL HIGH (ref 37–153)
BUN: 15 mg/dL (ref 7–25)
CO2: 28 mmol/L (ref 20–32)
Calcium: 9.2 mg/dL (ref 8.6–10.4)
Chloride: 103 mmol/L (ref 98–110)
Creat: 0.69 mg/dL (ref 0.50–1.05)
Globulin: 2.6 g/dL (calc) (ref 1.9–3.7)
Glucose, Bld: 77 mg/dL (ref 65–99)
Potassium: 4.3 mmol/L (ref 3.5–5.3)
Sodium: 141 mmol/L (ref 135–146)
Total Bilirubin: 0.3 mg/dL (ref 0.2–1.2)
Total Protein: 6.4 g/dL (ref 6.1–8.1)

## 2019-05-21 NOTE — Progress Notes (Signed)
Patient ID: Kimberly Shelton, female   DOB: 04-27-1961, 58 y.o.   MRN: [id number]   Subjective:    Patient ID: Kimberly Shelton, female    DOB: Dec 28, 1960, 58 y.o.   MRN: [id number]  HPI Here for follow up for pulmonary cavitary lesions.  I saw her last year and noted bronchiectasis and had a positive aspergillus Ig positive with negative Ag and no growth on sputum culture.  She had been relatively asymptomatic.  She more recently though has developed hemoptysis and a follow up CT scan with cavitary areas.  Along with hemoptysis, she had noted more productive cough, particularly in the am.  Had no weight loss, no night sweats.   She now has been on voriconazole for about 2 months.  I repeated the CT scan last visit and noted signficant improvement.  She is tolerating the voriconazole well. She does note some visual disturbances but they resolve about 30 minutes after taking it.  She has noted a positive improvement in her symptoms with much less cough, less sputum, less SOB and has been able to increase her activity.  She is very pleased with this.  She remains on prednisone for her RA.  No fever or chills.    Review of Systems  Constitutional: Negative for chills, fatigue and fever.  Respiratory: Negative for shortness of breath, wheezing and stridor.   Skin: Negative for rash.       Objective:   Physical Exam Constitutional:      Appearance: Normal appearance.  Eyes:     General: No scleral icterus. Cardiovascular:     Rate and Rhythm: Normal rate and regular rhythm.     Heart sounds: No murmur.  Pulmonary:     Effort: Pulmonary effort is normal. No respiratory distress.  Skin:    Findings: No rash.  Neurological:     Mental Status: She is alert.  Psychiatric:        Mood and Affect: Mood normal.    SH: not vaping       Assessment & Plan:

## 2019-05-21 NOTE — Telephone Encounter (Signed)
Medication refill request: Lexapro  Last AEX:  05/03/19 Next AEX: 05/05/20 Last MMG (if hormonal medication request): NA Refill authorized: #[id number] with 2 Rf pended for today.

## 2019-05-21 NOTE — Assessment & Plan Note (Signed)
I encouraged her to continue without vaping.

## 2019-05-21 NOTE — Assessment & Plan Note (Signed)
I will check a CMP on the vori

## 2019-05-21 NOTE — Assessment & Plan Note (Signed)
This is improving slowly with the voriconazole and I will continue this.  Hopefully with one or two more months it will resolve.

## 2019-05-21 NOTE — Assessment & Plan Note (Signed)
From an ID standpoint, if her CXR continues to improve, I think restarting appropriate therapy/biologics next month will be ok with close monitoring of her breathing and the cavitary areas.  I may keep her on the vori a bit longer to assure resolution.

## 2019-05-21 NOTE — Assessment & Plan Note (Signed)
I will repeat the CXR to see if there is any change.

## 2019-05-23 ENCOUNTER — Telehealth: Payer: Self-pay

## 2019-05-23 NOTE — Telephone Encounter (Signed)
Patient calling inquiring if she can get the flu shot while taking voriconazole. Patient was advised by pharmacy to ask Dr. Linus Salmons. Routing to provider for advise.  Kimberly Shelton

## 2019-05-23 NOTE — Telephone Encounter (Signed)
Yes, definitely

## 2019-05-23 NOTE — Telephone Encounter (Signed)
Patient called and made of Dr Comer's advise.  Kimberly Shelton

## 2019-06-20 ENCOUNTER — Ambulatory Visit: Payer: BC Managed Care – PPO | Admitting: Internal Medicine

## 2019-06-21 ENCOUNTER — Other Ambulatory Visit: Payer: Self-pay | Admitting: Pharmacist

## 2019-06-21 ENCOUNTER — Other Ambulatory Visit: Payer: Self-pay | Admitting: Internal Medicine

## 2019-06-21 DIAGNOSIS — B449 Aspergillosis, unspecified: Secondary | ICD-10-CM

## 2019-06-21 DIAGNOSIS — J984 Other disorders of lung: Secondary | ICD-10-CM

## 2019-06-21 NOTE — Progress Notes (Signed)
Patient called regarding a refill for Vfend.  She sees Dr. Linus Salmons on 11/23 and will run out of medication in the next few days.  Will refill her Vfend (per Dr. Henreitta Leber note from Sept) to last until she follows up with him in November.

## 2019-07-26 DIAGNOSIS — Z20828 Contact with and (suspected) exposure to other viral communicable diseases: Secondary | ICD-10-CM | POA: Diagnosis not present

## 2019-07-30 ENCOUNTER — Ambulatory Visit: Payer: BC Managed Care – PPO | Admitting: Internal Medicine

## 2019-08-01 ENCOUNTER — Ambulatory Visit (INDEPENDENT_AMBULATORY_CARE_PROVIDER_SITE_OTHER): Payer: BC Managed Care – PPO | Admitting: Internal Medicine

## 2019-08-01 ENCOUNTER — Telehealth: Payer: Self-pay | Admitting: *Deleted

## 2019-08-01 ENCOUNTER — Other Ambulatory Visit: Payer: Self-pay

## 2019-08-01 ENCOUNTER — Encounter: Payer: Self-pay | Admitting: Internal Medicine

## 2019-08-01 DIAGNOSIS — R042 Hemoptysis: Secondary | ICD-10-CM

## 2019-08-01 DIAGNOSIS — Z20828 Contact with and (suspected) exposure to other viral communicable diseases: Secondary | ICD-10-CM | POA: Diagnosis not present

## 2019-08-01 DIAGNOSIS — B449 Aspergillosis, unspecified: Secondary | ICD-10-CM

## 2019-08-01 NOTE — Telephone Encounter (Signed)
Patient reports Covid exposure 11/15 - 11/17. She and her sisters were exposed at church 11/15. She spent time with her 2 sisters during the next 2 days, shopping and eating in a restaurant on 11/7.  She was advised of the exposure and tested 11/19 at local urgent care. She had negative result 11/21, her sister had a positive result 11/21.  Her sister is now quarantining, is symptomatic.   Patient has a new productive cough with pink sputum since 11/23. No fever.  She states the pink sputum is present in the early morning and when she lays down in bed at night.  Patient scheduled for same day telephone visit with Dr Linus Salmons at 2:45.  Landis Gandy, RN

## 2019-08-01 NOTE — Progress Notes (Signed)
Patient ID: Kimberly Shelton, female   DOB: 1961-07-17, 58 y.o.   MRN: [id number]   Subjective:    Patient ID: Kimberly Shelton, female    DOB: [birth year], 58 y.o.   MRN: [id number]  HPI Here for follow up for pulmonary cavitary lesions.  I saw her last year and noted bronchiectasis and had a positive aspergillus Ig positive with negative Ag and no growth on sputum culture.  She had been relatively asymptomatic.  She more recently though has developed hemoptysis and a follow up CT scan with cavitary areas.  Along with hemoptysis, she had noted more productive cough, particularly in the am.  Had no weight loss, no night sweats.   She now has been on voriconazole for about 4 months. She continues to tolerate that.   She is worked in via a phone visit.  She noted some very light red-tinged sputum. No increase in symptoms or new concerns with her respiratory status otherwise.  She recently was exposed to someone with [place] while at church and now her two sisters are positive.  She is appropriately quarantining.  Her test last Saturday was negative.  She was retested today.   No fever.      Review of Systems  Constitutional: Negative for chills, fatigue and fever.  Respiratory: Negative for shortness of breath, wheezing and stridor.        Objective:   Physical Exam Pulmonary:     Effort: Pulmonary effort is normal.     Comments: Sounds normal, full sentences on phone   SH: not vaping       Assessment & Plan:

## 2019-08-01 NOTE — Assessment & Plan Note (Addendum)
She continues to tolerate this fine.  Will do follow up CXR at next visit  15 minutes spent on the visit

## 2019-08-01 NOTE — Assessment & Plan Note (Signed)
Seems very light and no concerning symptoms on the phone.  I most suspect this is from her recent nasal swab tests more than from the lungs.  Regardless, I discussed with her concerns to monitor including more blood, darker and more frequent.  If any concerns, she will go to the ED

## 2019-08-06 ENCOUNTER — Ambulatory Visit (INDEPENDENT_AMBULATORY_CARE_PROVIDER_SITE_OTHER): Payer: BC Managed Care – PPO | Admitting: Primary Care

## 2019-08-06 ENCOUNTER — Telehealth: Payer: Self-pay | Admitting: Internal Medicine

## 2019-08-06 ENCOUNTER — Encounter: Payer: Self-pay | Admitting: Primary Care

## 2019-08-06 DIAGNOSIS — R042 Hemoptysis: Secondary | ICD-10-CM | POA: Diagnosis not present

## 2019-08-06 DIAGNOSIS — U071 COVID-19: Secondary | ICD-10-CM

## 2019-08-06 MED ORDER — BENZONATATE 200 MG PO CAPS: 200.0000 mg | ORAL_CAPSULE | Freq: Three times a day (TID) | ORAL | 1 refills | Status: DC | PRN

## 2019-08-06 NOTE — Progress Notes (Signed)
Virtual Visit via Telephone Note  I connected with Kimberly Shelton on 08/06/19 at 11:30 AM EST by telephone and verified that I am speaking with the correct person using two identifiers.  Location: Patient: Home Provider: Office   I discussed the limitations, risks, security and privacy concerns of performing an evaluation and management service by telephone and the availability of in person appointments. I also discussed with the patient that there may be a patient responsible charge related to this service. The patient expressed understanding and agreed to proceed.   History of Present Illness: 58 year old female, former smoker. PMH significant for rheumatoid arthritis, chronic diastolic heart failure, cavitary lesion, blood tinge sputum, COVID-19 infection. Patient of Dr. Chase Caller, last seen by pulmonary NP for hemoptysis. Patient had a CT scan in spring 2019 which suggested possible aspergillus cavity. Dr. Chase Caller order Aspergillus antibody and she was positive for IgG and was referred to infectious disease.  She had a repeat CT scan on 03/08/2019 which was consistent with chronic fungal infection findings are likely due to aspergillosis superinfection. Continues to follow with ID and currently on Voriconazole.    08/06/2019 Patient presents today for televisit. Reports pink tinge sputum in morning only. She recently tested positive for Covid on 08/01/19. Her symptoms started on Monday 11/23 with nasal congestion. She reports some decrease in smell/taste and constipation.Taking mucinex once daily. States that her infectious disease provider Dr. Phineas Douglas is aware of recent positive covid test. She has an apt with Dr. Chase Caller on 12/28.  Denies fever, shortness of breath or other respiratory symptoms.   Observations/Objective:  - No significant shortness of breath, wheezing or cough noted during phone conversation  Assessment and Plan:  Hemoptysis - Minor hemoptysis described, no bright  red blood or large amount - No on blood thinner, no NSAIDS  - Tessalon perles for cough suppression - Plan monitor and if symptoms change or worsen notify office  Covid-19  - Encourage rest, oral hydration and tylenol prn fever/chills - If respiratory symptoms worsen present to ED  Constipation - Recommend oral hydration and daily stool softener   Follow Up Instructions:   - Patient has an apt Dec 28th with Dr. Chase Caller  I discussed the assessment and treatment plan with the patient. The patient was provided an opportunity to ask questions and all were answered. The patient agreed with the plan and demonstrated an understanding of the instructions.   The patient was advised to call back or seek an in-person evaluation if the symptoms worsen or if the condition fails to improve as anticipated.  I provided 22 minutes of non-face-to-face time during this encounter.   Martyn Ehrich, NP

## 2019-08-06 NOTE — Patient Instructions (Addendum)
Hemoptysis - Do not take NSAIDS or aspirin products if having active symptoms - Tessalon perles three times a day for cough suppression - Monitor symptoms if you notice bright red blood or large amount notify office  Covid-19  - Encourage rest, oral hydration and tylenol prn fever/chills - If respiratory symptoms worsen present to ED  Constipation - Daily stool softener such as colace over the counter    Follow-up: Patient has an apt Dec 28th with Dr. Chase Caller

## 2019-08-06 NOTE — Telephone Encounter (Signed)
Call made to patient, confirmed DOB, she reports she tested positive for covid last Wednesday. She states last Monday she started feeling bad. She reports she is coughing up green mucous. She is currently taking Voricanazole given to her by infectious disease. She state she has a fungal infection in her lung. She has been taking mucinex but it is not helping with the mucous. She states her mucous has now turned to a pink/red color. She has lost her taste and smell. She states her throat is dry and sore.   Tele-visit, appt made. Nothing further needed at this time.

## 2019-08-13 ENCOUNTER — Encounter: Payer: Self-pay | Admitting: Internal Medicine

## 2019-08-13 ENCOUNTER — Ambulatory Visit: Payer: BC Managed Care – PPO | Admitting: Internal Medicine

## 2019-08-13 ENCOUNTER — Other Ambulatory Visit: Payer: Self-pay

## 2019-08-13 VITALS — BP 120/72 | HR 84 | Temp 98.3°F | Wt 150.0 lb

## 2019-08-13 DIAGNOSIS — Z87891 Personal history of nicotine dependence: Secondary | ICD-10-CM | POA: Diagnosis not present

## 2019-08-13 DIAGNOSIS — J984 Other disorders of lung: Secondary | ICD-10-CM

## 2019-08-13 DIAGNOSIS — B449 Aspergillosis, unspecified: Secondary | ICD-10-CM

## 2019-08-13 DIAGNOSIS — K5900 Constipation, unspecified: Secondary | ICD-10-CM | POA: Diagnosis not present

## 2019-08-13 LAB — CBC
HCT: 34.4 % — ABNORMAL LOW (ref 35.0–45.0)
Hemoglobin: 11.2 g/dL — ABNORMAL LOW (ref 11.7–15.5)
MCH: 28.5 pg (ref 27.0–33.0)
MCHC: 32.6 g/dL (ref 32.0–36.0)
MCV: 87.5 fL (ref 80.0–100.0)
MPV: 9.1 fL (ref 7.5–12.5)
Platelets: 371 10*3/uL (ref 140–400)
RBC: 3.93 10*6/uL (ref 3.80–5.10)
RDW: 13.9 % (ref 11.0–15.0)
WBC: 11.4 10*3/uL — ABNORMAL HIGH (ref 3.8–10.8)

## 2019-08-13 NOTE — Assessment & Plan Note (Signed)
New issue.  I discussed using Colace or metamucil.   Hydrate.

## 2019-08-13 NOTE — Assessment & Plan Note (Signed)
No current vaping/smoking at this time.

## 2019-08-13 NOTE — Assessment & Plan Note (Signed)
Continues on voriconazole and tolerating well.  No changes at this time.

## 2019-08-13 NOTE — Assessment & Plan Note (Addendum)
Will check her CXR today to see if it continues to improve.  If there is good improvement, will consider stopping the voriconazole at the end of this month.    Follow up depending on CXR result

## 2019-08-13 NOTE — Progress Notes (Signed)
Patient ID: Kimberly Shelton, female   DOB: 07/29/1961, 58 y.o.   MRN: [id number]   Subjective:    Patient ID: Kimberly Shelton, female    DOB: [birth year], 58 y.o.   MRN: [id number]  HPI Here for follow up for pulmonary cavitary lesions.  I saw her last year and noted bronchiectasis and had a positive aspergillus Ig positive with negative Ag and no growth on sputum culture.  She had been relatively asymptomatic.  She more recently though has developed hemoptysis and a follow up CT scan with cavitary areas.  I put her on voriconazole and she has continued this.  A repeat CT scan showed some improvement.  She is tolerating the voriconazole well. She does note some visual disturbances but they resolve about 30 minutes after taking it.  She continues to note a positive improvement in her symptoms with much less cough, less sputum, less SOB and has been able to increase her activity.   She did recently have COVID after going to church.  Two of her sisters did as well.  Now 14 days since the positive test and fever only on the first day.  She feels better now.   I did a televisit with her 11/25 and she noted some blood-tinged sputum.  Also talked to [name], NP at the pulmonary clinic.  Since then, it has mainly been light pink sputum, no overt blood.  No worsening symptoms. She continues on voriconazole.  + constipation that is new.    Review of Systems  Constitutional: Negative for chills and fever.  Respiratory: Negative for cough, shortness of breath, wheezing and stridor.   Skin: Negative for rash.       Objective:   Physical Exam Constitutional:      Appearance: Normal appearance.  Eyes:     General: No scleral icterus. Cardiovascular:     Rate and Rhythm: Normal rate and regular rhythm.     Heart sounds: No murmur.  Pulmonary:     Effort: Pulmonary effort is normal. No respiratory distress.  Skin:    Findings: No rash.  Neurological:     Mental Status: She is alert.   Psychiatric:        Mood and Affect: Mood normal.    SH: not vaping       Assessment & Plan:

## 2019-08-16 ENCOUNTER — Ambulatory Visit
Admission: RE | Admit: 2019-08-16 | Discharge: 2019-08-16 | Disposition: A | Discharge status: Home / Self Care | Payer: BC Managed Care – PPO | Source: Ambulatory Visit | Attending: Internal Medicine | Admitting: Internal Medicine

## 2019-08-16 DIAGNOSIS — B449 Aspergillosis, unspecified: Secondary | ICD-10-CM | POA: Diagnosis not present

## 2019-08-20 ENCOUNTER — Telehealth: Payer: Self-pay

## 2019-08-20 NOTE — Telephone Encounter (Signed)
Patient calling about her Xray results. Routing to provider to review.  Kimberly Shelton

## 2019-08-20 NOTE — Telephone Encounter (Signed)
The CXR looked stable, no concerns.  The residual findings likely scaring, not of concern.  I have asked Dr. Chase Caller give his opinion as well when he sees her this week but likley will stop her voriconazole soon.  thanks

## 2019-08-21 NOTE — Telephone Encounter (Signed)
Thank you. Patient made aware. Eugenia Mcalpine, LPN

## 2019-08-23 ENCOUNTER — Ambulatory Visit: Payer: BC Managed Care – PPO | Admitting: Internal Medicine

## 2019-08-29 ENCOUNTER — Telehealth: Payer: Self-pay

## 2019-08-29 ENCOUNTER — Other Ambulatory Visit: Payer: Self-pay | Admitting: Internal Medicine

## 2019-08-29 DIAGNOSIS — B449 Aspergillosis, unspecified: Secondary | ICD-10-CM

## 2019-08-29 DIAGNOSIS — J984 Other disorders of lung: Secondary | ICD-10-CM

## 2019-08-29 NOTE — Telephone Encounter (Signed)
Right, no more refills and let her know she can stop when it finishes.  I know she sees pulmonary in January thanks

## 2019-08-29 NOTE — Telephone Encounter (Signed)
Received refill request from pharmacy for voriconazole. Per Md's last note medication might be stopped. Will forward message to MD to advise if refill is okay. [place]

## 2019-09-03 ENCOUNTER — Ambulatory Visit: Payer: BC Managed Care – PPO | Admitting: Internal Medicine

## 2019-09-13 ENCOUNTER — Encounter: Payer: Self-pay | Admitting: Internal Medicine

## 2019-09-13 ENCOUNTER — Telehealth: Payer: Self-pay | Admitting: Internal Medicine

## 2019-09-13 ENCOUNTER — Other Ambulatory Visit: Payer: Self-pay

## 2019-09-13 ENCOUNTER — Ambulatory Visit (INDEPENDENT_AMBULATORY_CARE_PROVIDER_SITE_OTHER): Payer: Self-pay | Admitting: Internal Medicine

## 2019-09-13 VITALS — BP 128/62 | HR 88 | Ht 63.0 in | Wt 149.2 lb

## 2019-09-13 DIAGNOSIS — I2510 Atherosclerotic heart disease of native coronary artery without angina pectoris: Secondary | ICD-10-CM

## 2019-09-13 DIAGNOSIS — R911 Solitary pulmonary nodule: Secondary | ICD-10-CM

## 2019-09-13 DIAGNOSIS — R011 Cardiac murmur, unspecified: Secondary | ICD-10-CM

## 2019-09-13 DIAGNOSIS — J479 Bronchiectasis, uncomplicated: Secondary | ICD-10-CM

## 2019-09-13 DIAGNOSIS — R042 Hemoptysis: Secondary | ICD-10-CM

## 2019-09-13 DIAGNOSIS — B449 Aspergillosis, unspecified: Secondary | ICD-10-CM

## 2019-09-13 NOTE — Progress Notes (Signed)
OV 03/04/2014 Chief Complaint  Patient presents with  . Follow-up    HFU-D/c from [facility]. Pt states she has chest soreness from coughing. Pt states with the O2 her breathing has improved. C/o dyspnea when working with PT and cough with yellow mucous.    Admitted 02/16/2014 through 02/24/2014 with right-sided pneumococcal pneumonia with cavitation in the setting of smoking and rheumatoid arthritis on immune modulators. At this point in time she is off the immunomodulators. Dr. Amil Amen her rheumatologist is aware of this. She is 25% back to her baseline but still very fatigued and short of breath. She coughs a lot with home physical therapy. She brings up green mucus and sputum with her cough needed overall cough is significantly improved since discharge. Appetite is slowly returning. She works as a Art gallery manager at [employer] and does not want to go to work till she is fully off oxygen. She is requesting to stay off work for several weeks. On 2 L there is documentation that when she walks 2 minutes up saturation still stays at 95%.   Today in the office pon room air at rest t it is 93%. Walked 40 feet and desaturated to 89% but HR 150/min per tech: I think this is all deconditioning but not sure if she has cardiomyopathy from her illness   04/02/14 Follow up  Pt returns for follow up from recent sever critical illness from streptococcal PNA , Bacteremia, sepsis 02/2014.   She had a right-sided pneumococcal pneumonia with cavitation in the setting of smoking and rheumatoid arthritis on immune modulators Today PFT show FEV1 61% , ratio 87  , FVC 55 %  , NO sign BD response, Diffusing capacity  Decreased 57% Has not smoked.  Wants to return to work soon.  CXR shows persistent RUL/LLL cavitary lesions .  Says she is feeling so much better.  No fever, chest pain, orthopnea, edema or n/v/d.  Good appetite . Seen by cards for tachycardia felt secondary to recent PN   OV 05/01/2014  Chief  Complaint  Patient presents with  . Follow-up    Pt states her breathing has improved since last OV. Pt c/o prod cough with yellow and clear mucous and lower mid lumbar pain, pt thinks d/t RA. Pt doing duoneb only once a day.  Pt denies SOB.     Admitted 02/16/2014 through 02/24/2014 with right-sided pneumococcal pneumonia with cavitation in the setting of smoking and rheumatoid arthritis on immune modulators. Current followup is for the same   S: Presents with husband. Feels great overall. No fatigute. No dyspnea. Able to do all ADLs. Wants to return to work now and is asking for release to work. In terms of RA, she feels early morning stiffness of hand joint is returning. SHe is seeing Dr Amil Amen 05/01/14 and will discuss with him about return to immunomodulators. There are no other issues. CXR done after she left and at time of this note:    Dg Chest 2 View  04/30/2014   CLINICAL DATA:  Followup cavitary pneumonia  EXAM: CHEST  2 VIEW  COMPARISON:  04/02/2014  FINDINGS: Normal heart size, mediastinal contours, and pulmonary vascularity.  Patchy infiltrates are identified in RIGHT upper lobe and LEFT lower lobe consistent with pneumonia.  Cavitary foci identified previously in the in both the RIGHT upper and LEFT lower lobes are still seen, the less evident in the RIGHT upper lobe.  Scattered interstitial prominence again identified.  No gross pleural  effusion or pneumothorax.  Bones unremarkable.  IMPRESSION: Persistent infiltrates are identified in the RIGHT upper lobe and LEFT lower lobe with persistent visualization of cavitary foci.   Electronically Signed   By: Lavonia Dana M.D.   On: 04/30/2014 19:09      OV 08/09/2014  Chief Complaint  Patient presents with  . Follow-up    Pt here after CT scan. Pt denies change in breathing since last OV. Pt c/o prod cough with clear and green mucus.     Presents for Pneumococcal pneumonia followup that developed in setting of RA and Simponi  She  is being maintained on ARAVA since her pneumococcal pneumonia earlier in 2013.  Now she is free of symptoms of respiratory tree. CT 08/08/14 shows further improvement but has architectural distortion. HEr RA is now significantly  active - per her and my talking 08/09/2014 with Dr Amil Amen. Patient is very apprehensive of starting ORENCIA recommended by Dr Amil Amen due to increased aecopd risk (She does not have copd), recurrent infection esp in setting of focal archiectrual distortion and recent pneumonia. She is very scared of dealing with RA disability v infection risk   Social and past and family: Mom had RA. She is upset abotu her disease  CT 08/08/14  IMPRESSION: 1. Emphysema with scattered scarring, volume loss, scattered bulla (some of which are still inflamed and with a somewhat cavitary appearance), and bilateral airway thickening. Much of this represents residua from prior multi lobar cavitary pneumonia, and is improved compared to the prior July CT scan. Given the degree of architectural distortion, scattered nodularity and opacities, and and irregular scarring, CT is not able to clear the lung of the possibility of malignancy, and surveillance imaging may be warranted.   Electronically Signed  By: Sherryl Barters M.D.  On: 08/08/2014 15:32  OV 11/06/2014  Chief Complaint  Patient presents with  . Follow-up    Coughing up whitish colored phlegm, SOB with walking, not always, sometimes.      Presents for Pneumococcal pneumonia followup that developed in setting of RA and Simponi June 2015, with resultant architectural distortion in her lung anatomy  Last seen December 2015. Since then overall she's doing well. At baseline she only has mild cough with occasional white sputum. She also has exertional dyspnea when she climbs up a hill but clearly in cold air he did this is relieved by rest. She is currently on 2 immunomodulators for her rheumatoid arthritis Orencia and Arava and  this is helping her joints. She's not had any respiratory complications so far. I notice that she is not on Bactrim prophylaxis but she is concerned about the side effects. And she wants to talk to her rheumatologist Dr. Amil Amen about this  Past, Family, Social reviewed: no change since last visit   OV 02/10/2015  Chief Complaint  Patient presents with  . Follow-up    Pt stated her breathing feels it has improved since last OV. Pt here after PFT. Pt stated she has a little more trouble breathing when it is hot out. Pt c/o mild prod cough with clear and small light green mucus. Pt denies CP/tightness.    Follow-up chronic destructive pulmonary to changes following pneumococcal pneumonia in 2015 in the setting of previous heavy smoking and immunomodulatory treatment for rheumatoid arthritis   Since seeing me last in March 2016 she feels that the albuterol inhaler that I tried for dyspnea and exertion and wheezing has helped. Dyspnea still persists. It is unchanged. It is  mild. However it is improved with albuterol which she takes prior to exertion. There is no associated cough or. Wheezing is improved. No hemoptysis no fever no chills .Outside chart from Dr. Amil Amen her rheumatologist dated 02/04/2015 reviewed. She reported to him that she is compliant on Ornecia and arava with good relief and joint pain. Her dyspnea is mild chronic and stable. His advice for her to continue the same. He suggested that I look into her chronic residual dyspnea at this visit   Last pulmonary imaging with CT scan of the chest December 2015:residual changes + along with emphysema; report reviewed. Image not visualized  Pulmonary function test 02/10/2015 today personally reviewed image: FEV1 prebronchodilator 1.7 L/64%, ratio 69.  FEV1 postbronchodilator is 1.9 L/72% and is a 12% positive response with broncho-dilator., FVC 2.4 L/72% and ratio of 79. Total lung capacity is 3.76 L/76%. DLCO is 18.5/80%. PFTs are consistent  with restriction and a normal DLCO but in the prebronchodilator she has mild obstruction   OV 08/11/2015  Chief Complaint  Patient presents with  . Follow-up    Pt states her breathing has improved since last OV. Pt states she has only been using the spiriva as needed. Pt c/o prod cough with clear mucus and DOE with heavy exertion. pt denies CP/tightness.     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 She continues to do well. She is now on leflunomide and Orencia for her rheumatoid arthritis. With this rheumatoid arthritis symptoms have improved. She is follows up with Dr. Amil Amen in his new rheumatology practice [facility]. She feels that her wheezing has significantly improved. Shortness of breath significantly improved. She is not taking Spiriva anymore. She reported some hoarseness with it. She wants to continue to watch herself without Spiriva. She is up-to-date with her flu shot.   OV 02/09/2016  Chief Complaint  Patient presents with  . Follow-up    Pt reports that her breathing is doing well. Pt c/o occasional dry cough that is worse when using a vape for recreational use. Pt also c/o occasional wheeze with hot, humid weather and exertion. Pt does use albuterol HFA and her symptoms improve. Pt denies SOB/CP/tightness.        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 Doing well but has started smoking again though is nicotine-free vape smoking now. She did not want spiriva due to dpi and throat irritation. But she is using albuterol atleast daily. She is having exertional wheeze but otherwise okayt. Maureen Chatters  continues with Dr Amil Amen. She tells me she never had shingles vaccine. Otherwise well. Working at [employer] . Does not want cxr 02/09/2016 for fu. Wants to defer till next visit   OV 12/08/2017  Chief Complaint  Patient presents with  . Follow-up    Last seen 02/09/16.  Pt states she has been doing well since last visit and denies any complaints.      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  She went through a separation and divorce and this time.  Smoking continues  to be in remission.  At last visit she was smoking but now is in remission.  She continues to work at the [employer].  For her rheumatoid arthritis she has been switched out of orcencia to [place] and this is working well for her.  She only has some mild pain.  In terms of her respiratory symptoms she hardly has any symptoms.  Definitely no cough.  She has mild dyspnea on exertion when working out in the yard but this is stable.  Review of the chart shows that her last echocardiogram was in 2015.  Her last chest x-ray or imaging was in 2016 and so was a pulmonary function test.  She wants to make sure her lungs are doing okay at this point.   OV 01/27/2018  Chief Complaint  Patient presents with  . Follow-up    Breathing is improved since last OV. Still has some coughing in the mornings. Denies chest tightness, wheezing.   Keyoni Lapinski presents for follow-up after testing.  In the interim she continues to be stable.  She only has mild dyspnea on exertion when she does gardening.  For this she uses albuterol as needed she does not want to upscale on her inhalers.  Testing showed atrial septal aneurysm and coronary artery calcification.  Cardiology has reassured her on both accounts.  In terms of the lung function that seems to be  a decline in FEV1 compared to 2016 but very similar to 2015.  DLCO is stable all along.  She feels stable.  CT scan of the chest shows improved bronchiectasis compared to few years ago.  The only issue is that in the right upper lobe that seems to be a cavity with a potential aspergilloma although this is totally uncertain at this stage.  She does not have any hemoptysis or any symptoms.  She is immunocompromised on rheumatoid arthritis medication Orencia at this point.  Apparently the Morrie Sheldon was stopped.   Results for DESHAUN, SCHOU (MRN [id number]) as of 01/27/2018 10:48  Ref. Range 04/02/2014 11:42 02/10/2015 13:49 01/27/2018 09:57  FEV1-Post Latest Units: L 1.68 1.89 1.63  FEV1-%Pred-Post Latest Units: % 64 73 64  FEV1-%Change-Post Latest Units: % _0 Results for DEQUITA, SCHLEICHER (MRN [id number]) as of 01/27/2018 10:48  Ref. Range 04/02/2014 11:42 02/10/2015 13:49 01/27/2018 09:57  DLCO unc Latest Units: ml/min/mmHg 13.15 18.50 18.02  DLCO unc % pred Latest Units: % 57 80 78    IMPRESSION: CT chest 1. Widespread areas of bronchiectasis and scarring, generally similar to the prior examination. Two of the thick-walled cavitary areas in the right upper lobe seen on the prior study have resolved, however, there is a new area of scarring in the right upper lobe near the apex which contains some internal soft tissue which is favored to represent some sloughed necrotic lung tissue, although an aspergilloma is not entirely excluded. Close attention on future follow-up imaging is recommended. 2. Possible honeycombing in the extreme lung bases which could indicate early interstitial lung disease. Future follow-up examination should be performed is high-resolution chest CTs to better evaluate these findings. 3. Cholelithiasis. 4. Aortic atherosclerosis, in addition to 2 vessel coronary artery disease. Please note that although the presence of coronary artery calcium documents the presence  of coronary artery disease, the severity of this disease and any potential stenosis cannot be assessed on this non-gated CT examination. Assessment for potential risk factor modification, dietary therapy or pharmacologic therapy may be warranted, if clinically indicated.  Aortic Atherosclerosis (ICD10-I70.0).   Electronically Signed   By: Trudie Reed M.D.   On: 12/21/2017 13:00   ECHO 12/12/17 Study Conclusions  - Left ventricle: The cavity size was normal. Wall thickness was   normal. Systolic function was vigorous. The estimated ejection   fraction was in the range of 65% to 70%. Wall motion was normal;   there were no regional wall motion abnormalities. Doppler   parameters are consistent with abnormal left ventricular   relaxation (grade 1 diastolic dysfunction). - Atrial septum: There was an atrial septal aneurysm.  Impressions:  - Vigorous LV systolic function, mild diastolic dysfunction.  OV 08/11/2018  Subjective:  Patient ID: Kimberly Shelton, female , DOB: [birth year] , age 36 y.o. , MRN: [id number] , ADDRESS: [address] [place] [address]   08/11/2018 -   Chief Complaint  Patient presents with  . Follow-up    f/u bronchiectasis, PFT today, pt doing well , only cough in tha am with little mucus     HPI ANBERLYN FEIMSTER 59 y.o. -presents for follow-up of bronchiectasis suffered in 2015 following really severe pneumococcal pneumonia in the setting of immunosuppression due to rheumatoid arthritis.  This routine follow-up.  When I saw her in the spring 2019 we did a CT scan of the chest and there was suggestion of possible aspergillus cavity.  Did Aspergillus antibody and she was positive for IgG.  Therefore referred her to infectious diseases.  I reviewed the note.  Given overall stability expectant approach has been adopted.  At this point in time she feels stable using albuterol as needed.  Vaccine records indicate she could benefit from both  shingles vaccine and Prevnar.  She seems to be under the assumption that shingles vaccine is only after age 63.  I reviewed the literature and it is actually indicated for people over 50.  However she should only get the new Shingrix vaccine which is inactivated.  Prevnar is also indicated for her given her previous pneumococcal pneumonia and her immunosuppressed status.  Coronary artery calcification on CT scan: This was seen in April 2019.  Since then she did see cardiology.  They are aware of this and the place her on follow-up.  She does not have any chest pain currently.    OV 09/13/2019  Subjective:  Patient ID: Kimberly Shelton, female , DOB: [birth year] , age 59 y.o. , MRN: [id number] , ADDRESS: [address] [place] [address]   09/13/2019 -   Chief Complaint  Patient presents with  . Follow-up    Pt states she has been spitting up bloody mucus every morning and states her throat is also raw feeling. Pt was diagnosed with covid 11/19 and did okay with that other than losing her sense of taste and smell.   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 Joaquim Lai  HPI Kimberly Shelton 59 y.o. -returns for follow-up.  Last seen in December 2019.  After that in the summer 2020 CT scan of the chest showed progression.  I referred her to Dr. Molly Maduro,.  Who placed her on voriconazole.  She is currently finished her treatment with voriconazole but she tells me for the last 6 months she continues to have early morning mild hemoptysis versus pink sputum.  She says  that despite the voriconazole this persist.  In early November 2020/mid November 2020 she picked up COVID-19 while singing in a church along with her sister who also had COVID-19.  One of the person  died in that cluster.  That happens to be my patient.  She tells me since the COVID-19 for which she did not get hospitalized she has had slight increase in cough and slight increase in hemoptysis and ongoing fatigue.  For her rheumatoid arthritis she is only on prednisone she is unable to take any immunomodulators because of her pulmonary issues.  The disease has gotten progressive with the onset of rheumatoid nodules in her bilateral elbows.  She has gained weight in the pandemic because of being sedentary.   Review of the chart indicates last echo was in 2019.  She has a murmur.  Her last CT scan of the chest was in August 2020.   She wants to know if it is okay to go back on immunosuppressive's.  ROS - per HPI     has a past medical history of Abnormal Pap smear (09/2006), CHF (congestive heart failure) (HCC), Foot fracture, left (10/16), Pneumonia, and RA (rheumatoid arthritis) (HCC) (07/2009).   reports that she quit smoking about 5 years ago. Her smoking use included cigarettes and e-cigarettes. She has a 30.00 pack-year smoking history. She has never used smokeless tobacco.  Past Surgical History:  Procedure Laterality Date  . CERVICAL BIOPSY  W/ LOOP ELECTRODE EXCISION  2008   CIN 3  . CERVICAL CONIZATION W/BX N/A 12/15/2015   Procedure: CONIZATION CERVIX WITH BIOPSY ;  Surgeon: Jerene Bears, MD;  Location: [facility];  Service: Gynecology;  Laterality: N/A;  POSSIBLE COLD KNIFE CONE.  Please have the microscope in the room  . COLONOSCOPY    . LEEP N/A 12/15/2015   Procedure: LOOP ELECTROSURGICAL EXCISION PROCEDURE (LEEP) with colpo;  Surgeon: Jerene Bears, MD;  Location: [facility];  Service: Gynecology;  Laterality: N/A;  . PILONIDAL CYST EXCISION  1999   I&D  . WISDOM TOOTH EXTRACTION      Allergies  Allergen Reactions  . Bee Venom Anaphylaxis  . Codeine Other (See Comments)    "seeing spots"  . Penicillins Rash    Has patient had a PCN reaction causing immediate rash,  facial/tongue/throat swelling, SOB or lightheadedness with hypotension: No Has patient had a PCN reaction causing severe rash involving mucus membranes or skin necrosis: No Has patient had a PCN reaction that required hospitalization No Has patient had a PCN reaction occurring within the last 10 years: Yes If all of the above answers are "NO", then may proceed with Cephalosporin use.     Immunization History  Administered Date(s) Administered  . Influenza Inj Mdck Quad Pf 05/25/2019  . Influenza,inj,Quad PF,6+ Mos 06/25/2015, 05/31/2017, 05/29/2018  . Influenza-Unspecified 06/06/2014  . Pneumococcal Conjugate-13 08/11/2018  . Pneumococcal Polysaccharide-23 02/18/2014  . Tdap 09/01/2016    Family History  Problem Relation Age of Onset  . Diabetes Mother   . Rheum arthritis Mother   . Diabetes Father   . COPD Father   . Bronchitis Father   . Cancer Brother        Lung  . Breast cancer Maternal Aunt 75  . CAD Neg Hx      Current Outpatient Medications:  .  benzonatate (TESSALON) 200 MG capsule, Take 1 capsule (200 mg total) by mouth 3 (three) times daily as needed for cough., Disp: 30 capsule, Rfl: 1 .  escitalopram (LEXAPRO) 10 MG tablet, TAKE 1 TABLET BY MOUTH EVERY DAY, Disp: 90 tablet, Rfl: 2 .  predniSONE (DELTASONE) 5 MG tablet, Take 5 mg by mouth 2 (two) times daily., Disp: , Rfl:  .  PROAIR HFA 108 (90 Base) MCG/ACT inhaler, IHALE 2 PUFFS INTO THE LUNGS EVERY 6 HOURS AS NEEDED FOR WHEEZING OR SHORTNESS OF BREATH, Disp: 8.5 g, Rfl: 3      Objective:   Vitals:   09/13/19 1115  BP: 128/62  Pulse: 88  SpO2: 97%  Weight: 149 lb 3.2 oz (67.7 kg)  Height: 5\' 3"  (1.6 m)    Estimated body mass index is 26.43 kg/m as calculated from the following:   Height as of this encounter: 5\' 3"  (1.6 m).   Weight as of this encounter: 149 lb 3.2 oz (67.7 kg).  @WEIGHTCHANGE @    09/13/19 1115  Weight: 149 lb 3.2 oz (67.7 kg)     Physical Exam  General  Appearance:    Alert, cooperative, no distress, appears stated age - yes , Deconditioned looking - no , OBESE  - no, Sitting on Wheelchair -  no  Head:    Normocephalic, without obvious abnormality, atraumatic  Eyes:    PERRL, conjunctiva/corneas clear,  Ears:    Normal TM's and external ear canals, both ears  Nose:   Nares normal, septum midline, mucosa normal, no drainage    or sinus tenderness. OXYGEN ON  - no . Patient is @ ra   Throat:   Lips, mucosa, and tongue normal; teeth and gums normal. Cyanosis on lips - no  Neck:   Supple, symmetrical, trachea midline, no adenopathy;    thyroid:  no enlargement/tenderness/nodules; no carotid   bruit or JVD  Back:     Symmetric, no curvature, ROM normal, no CVA tenderness  Lungs:     Distress - no , Wheeze no, Barrell Chest - no, Purse lip breathing - no, Crackles - no   Chest Wall:    No tenderness or deformity.    Heart:    Regular rate and rhythm, S1 and S2 normal, no rub   or gallop, Murmur - YES  Breast Exam:    NOT DONE  Abdomen:     Soft, non-tender, bowel sounds active all four quadrants,    no masses, no organomegaly. Visceral obesity - no  Genitalia:   NOT DONE  Rectal:   NOT DONE  Extremities:   Extremities - normal, Has Cane - no, Clubbing - no, Edema - no  Pulses:   2+ and symmetric all extremities  Skin:   Stigmata of Connective Tissue Disease - YES - RA NODULES IN ELBOW  Lymph nodes:   Cervical, supraclavicular, and axillary nodes normal  Psychiatric:  Neurologic:   Pleasant - yes, Anxious - no, Flat affect - no  CAm-ICU - neg, Alert and Oriented x 3 - yes, Moves all 4s - yes, Speech - normal, Cognition - intact           Assessment:       ICD-10-CM   1. Bronchiectasis without complication (HCC)  J47.9   2. Aspergillosis (HCC)  B44.9   3. Hemoptysis  R04.2   4. Solitary pulmonary nodule  R91.1   5. Cardiac murmur  R01.1   6. Coronary artery calcification seen on CT scan  I25.10        Plan:     Patient  Instructions  Bronchiectasis without complication (HCC) Aspergillosis (HCC) Hemoptysis  -  new coughing blood x 6 months  Plan  = do CT scan chest without contrast   -    Cardiac murmur Coronary artery calcification seen on CT scan  - do not see stress test done on you - echo 2019 was last time echo was done  Plan  - do ECHO -  Rheumatoid Arthtiris   -seems disease is active with presence of elbow nodules  Plan  - per Dr Amil Amen - likle to discuss with him before starting any treatment for RA   Followup 15 min telephine visit next few weeks to discuss CT and echo  results - plan for bronchoscopy based on Ct results  ( Level 03: Esbt 20-29 min  in-site visit  in total care time and counseling or/and coordination of care by this undersigned MD - Dr Brand Males. This includes one or more of the following all delivered on this same day 09/13/2019: pre-charting, chart review, note writing, documentation discussion of test results, diagnostic or treatment recommendations, prognosis, risks and benefits of management options, instructions, education, compliance or risk-factor reduction. It excludes time spent by the [place] or office staff in the care of the patient. Actual time was 24 min)    SIGNATURE    Dr. Brand Males, M.D., F.C.C.P,  Pulmonary and Critical Care Medicine Staff Physician, [place] Director - Interstitial Lung Disease  Program  [facility] [place], [place], [address]  Pager: [phone], If no answer or between  15:00h - 7:00h: call [phone] Telephone: [phone]  12:08 PM 09/13/2019

## 2019-09-13 NOTE — Telephone Encounter (Signed)
Routing to MR. 

## 2019-09-13 NOTE — Patient Instructions (Addendum)
Bronchiectasis without complication (HCC) Aspergillosis (HCC) Hemoptysis  - new coughing blood x 6 months  Plan  = do CT scan chest without contrast   -    Cardiac murmur Coronary artery calcification seen on CT scan  - do not see stress test done on you - echo 2019 was last time echo was done  Plan  - do ECHO -  Rheumatoid Arthtiris   -seems disease is active with presence of elbow nodules  Plan  - per Dr Dierdre Forth - likle to discuss with him before starting any treatment for RA   Followup 15 min telephine visit next few weeks to discuss CT and echo  results - plan for bronchoscopy based on Ct results

## 2019-09-13 NOTE — Addendum Note (Signed)
Addended by: Wyvonne Lenz on: 09/13/2019 12:28 PM   Modules accepted: Orders

## 2019-09-14 NOTE — Telephone Encounter (Signed)
I discussed with Dr. Dierdre Forth yesterday.  The plan is that for her to have a CT scan of the chest and after that depending on the results of possible bronchoscopy.  He will then consider leflunomide/Arava.  We both agreed that it would be very risky to do any Biologics against rheumatoid arthritis given her pulmonary issues

## 2019-09-17 NOTE — Telephone Encounter (Signed)
Patient is scheduled to have both the echo and CT 1/14. Nothing further needed.

## 2019-09-19 ENCOUNTER — Telehealth: Payer: Self-pay | Admitting: Cardiovascular Disease

## 2019-09-19 NOTE — Telephone Encounter (Signed)
New Message  Pt was returning a phone call from [facility]  Please call back

## 2019-09-20 ENCOUNTER — Ambulatory Visit (HOSPITAL_COMMUNITY): Payer: 59 | Attending: Cardiovascular Disease

## 2019-09-20 ENCOUNTER — Inpatient Hospital Stay: Admission: RE | Admit: 2019-09-20 | Payer: Self-pay | Source: Ambulatory Visit

## 2019-09-20 ENCOUNTER — Other Ambulatory Visit: Payer: Self-pay

## 2019-09-20 DIAGNOSIS — R011 Cardiac murmur, unspecified: Secondary | ICD-10-CM | POA: Insufficient documentation

## 2019-09-24 ENCOUNTER — Inpatient Hospital Stay: Admission: RE | Admit: 2019-09-24 | Payer: Self-pay | Source: Ambulatory Visit

## 2019-09-25 ENCOUNTER — Telehealth: Payer: Self-pay | Admitting: Internal Medicine

## 2019-09-25 NOTE — Telephone Encounter (Signed)
Called spoke with patient. She will contact cardiology and make an appointment with them. Nothing further needed at this time.

## 2019-09-25 NOTE — Telephone Encounter (Signed)
Spoke with pt, she would like her results from her echocardiogram test. MR please advise.

## 2019-09-25 NOTE — Telephone Encounter (Signed)
Echo reviwed - is normal except there is a description of a shunt which means blood flow going from one side to another side within the heart. Could be contributing to shortness of breath but also could be a technical issue on echo. I do not know  Plan  - She is known to Dr Clifton James in cardiology - pls make appt with him or an APP    SIGNATURE    Dr. Kalman Shan, M.D., F.C.C.P,  Pulmonary and Critical Care Medicine Staff Physician, [facility] Director - Interstitial Lung Disease  Program  Pulmonary Fibrosis [facility] at [facility] [place], [place], [address]  Pager: [phone], If no answer or between  15:00h - 7:00h: call [phone] Telephone: [phone]  4:52 PM 09/25/2019

## 2019-09-26 ENCOUNTER — Ambulatory Visit (INDEPENDENT_AMBULATORY_CARE_PROVIDER_SITE_OTHER)
Admission: RE | Admit: 2019-09-26 | Discharge: 2019-09-26 | Disposition: A | Discharge status: Home / Self Care | Payer: 59 | Source: Ambulatory Visit | Attending: Internal Medicine | Admitting: Internal Medicine

## 2019-09-26 ENCOUNTER — Other Ambulatory Visit: Payer: Self-pay

## 2019-09-26 DIAGNOSIS — J479 Bronchiectasis, uncomplicated: Secondary | ICD-10-CM | POA: Diagnosis not present

## 2019-09-26 DIAGNOSIS — R042 Hemoptysis: Secondary | ICD-10-CM | POA: Diagnosis not present

## 2019-09-27 NOTE — Progress Notes (Signed)
Ct with fibrosis - will discuss at 10/11/2019 visit

## 2019-09-28 ENCOUNTER — Inpatient Hospital Stay: Admission: RE | Admit: 2019-09-28 | Payer: Self-pay | Source: Ambulatory Visit

## 2019-09-28 ENCOUNTER — Ambulatory Visit: Payer: 59 | Admitting: Physician Assistant

## 2019-10-01 NOTE — Progress Notes (Signed)
Virtual Visit via Telephone Note   This visit type was conducted due to national recommendations for restrictions regarding the COVID-19 Pandemic (e.g. social distancing) in an effort to limit this patient's exposure and mitigate transmission in our community.  Due to her co-morbid illnesses, this patient is at least at moderate risk for complications without adequate follow up.  This format is felt to be most appropriate for this patient at this time.  The patient did not have access to video technology/had technical difficulties with video requiring transitioning to audio format only (telephone).  All issues noted in this document were discussed and addressed.  No physical exam could be performed with this format.  Please refer to the patient's chart for her  consent to telehealth for [facility].   Date:  10/02/2019   ID:  BRIZZA NATHANSON, DOB [birth year], MRN [id number]  Patient Location: Home Provider Location: Office  PCP:  Kristian Covey, MD  Cardiologist:  Verne Carrow, MD   Electrophysiologist:  None   Evaluation Performed:  Follow-Up Visit  Chief Complaint:  F/u echo  History of Present Illness:    EMORIE MCFATE is a 59 y.o. female  who was seen by Dr. Clifton James in 2016 for evaluation of tachycardia.  Echo at that time normal LV function with no evidence of pericardial effusion.  Was felt her tachycardia was in response to her acute illness of pneumococcal pneumonia with cavitation in the setting of smoking and rheumatoid arthritis on immune modulators.  Tachycardia resolved after treatment of pneumonia.   I saw the patient 12/29/17 when referred back by Dr. [name] because of  echo findings of atrial septal aneurysm.  2D echo 12/12/17 normal LVEF 65-70% with grade 1 DD and atrial septal aneurysm. Dr. Elease Hashimoto reviewed and felt it was an incidental finding and she probably had it all her life.   Patient was having worsening shortness of breath with  desaturations to 89% walking 40 feet heart rate up to 150 bpm felt to be deconditioning.  Echo was ordered.  Patient is referred back by Dr. [name] because of abnormal echo done 09/20/2019.  She had normal LVEF 60 to 65% with mild increased LVH, possible left to right shunt across the intra-atrial septum.  Chest CT 09/26/2019 showed progressive fibrotic interstitial lung disease with mild to moderate honeycombing clearly progressed compared to CT in 2015 in 2019.  Patient had to change the visit to virtual because of leg pain. says she is coughing up pinkish red phlegm in am but overall breathing is doing fine. She can walk 100 yards without getting short of breath and further if she uses her inhaler. 2Decho reviewed in detail with the patient.  The patient does not have symptoms concerning for COVID-19 infection (fever, chills, cough, or new shortness of breath).    Past Medical History:  Diagnosis Date  . Abnormal Pap smear 09/2006   HGSIL CIN 2/VAIN/ CIN 3/VAIN-3 CIS  . CHF (congestive heart failure) (HCC)   . Foot fracture, left 10/16   hair-line fracture  . Pneumonia    was hospitalized  . RA (rheumatoid arthritis) (HCC) 07/2009   Past Surgical History:  Procedure Laterality Date  . CERVICAL BIOPSY  W/ LOOP ELECTRODE EXCISION  2008   CIN 3  . CERVICAL CONIZATION W/BX N/A 12/15/2015   Procedure: CONIZATION CERVIX WITH BIOPSY ;  Surgeon: Jerene Bears, MD;  Location: [facility];  Service: Gynecology;  Laterality: N/A;  POSSIBLE COLD KNIFE  CONE.  Please have the microscope in the room  . COLONOSCOPY    . LEEP N/A 12/15/2015   Procedure: LOOP ELECTROSURGICAL EXCISION PROCEDURE (LEEP) with colpo;  Surgeon: Jerene Bears, MD;  Location: [facility];  Service: Gynecology;  Laterality: N/A;  . PILONIDAL CYST EXCISION  1999   I&D  . WISDOM TOOTH EXTRACTION       Current Meds  Medication Sig  . benzonatate (TESSALON) 200 MG capsule Take 1 capsule (200 mg total) by mouth 3 (three) times daily as  needed for cough.  . escitalopram (LEXAPRO) 10 MG tablet TAKE 1 TABLET BY MOUTH EVERY DAY  . naproxen sodium (ALEVE) 220 MG tablet Take 220 mg by mouth daily as needed.  . predniSONE (DELTASONE) 5 MG tablet Take 5 mg by mouth 2 (two) times daily.  Marland Kitchen PROAIR HFA 108 (90 Base) MCG/ACT inhaler IHALE 2 PUFFS INTO THE LUNGS EVERY 6 HOURS AS NEEDED FOR WHEEZING OR SHORTNESS OF BREATH     Allergies:   Bee venom, Codeine, and Penicillins   Social History   Tobacco Use  . Smoking status: Former Smoker    Packs/day: 1.00    Years: 30.00    Pack years: 30.00    Types: Cigarettes, E-cigarettes    Quit date: 02/16/2014    Years since quitting: 5.6  . Smokeless tobacco: Never Used  Substance Use Topics  . Alcohol use: No    Alcohol/week: 0.0 standard drinks  . Drug use: No     Family Hx: The patient's family history includes Breast cancer (age of onset: 33) in her maternal aunt; Bronchitis in her father; COPD in her father; Cancer in her brother; Diabetes in her father and mother; Rheum arthritis in her mother. There is no history of CAD.  ROS:   Please see the history of present illness.     All other systems reviewed and are negative.   Prior CV studies:   The following studies were reviewed today:  2D echo 1/14/2021IMPRESSIONS      1. Left ventricular ejection fraction, by visual estimation, is 60 to 65%. The left ventricle has normal function. There is mildly increased left ventricular hypertrophy.  2. The left ventricle has no regional wall motion abnormalities.  3. Global right ventricle has normal systolic function.The right ventricular size is normal. No increase in right ventricular wall thickness.  4. Left atrial size was normal.  5. Right atrial size was normal.  6. The mitral valve is normal in structure. Trivial mitral valve regurgitation.  7. The tricuspid valve is normal in structure.  8. The aortic valve is tricuspid. Aortic valve regurgitation is not visualized. No  evidence of aortic valve sclerosis or stenosis.  9. The pulmonic valve was grossly normal. Pulmonic valve regurgitation is not visualized. 10. The inferior vena cava is normal in size with greater than 50% respiratory variability, suggesting right atrial pressure of 3 mmHg. 11. Possible left to right shunt across intra-atrial septum.   FINDINGS  Left Ventricle: Left ventricular ejection fraction, by visual estimation, is 60 to 65%. The left ventricle has normal function. The left ventricle has no regional wall motion abnormalities. There is mildly increased left ventricular hypertrophy.  Asymmetric left ventricular hypertrophy of the basal-septal wall. Left ventricular diastolic parameters were normal.   Right Ventricle: The right ventricular size is normal. No increase in right ventricular wall thickness. Global RV systolic function is has normal systolic function.   Left Atrium: Left atrial size was normal in size.  Right Atrium: Right atrial size was normal in size   Pericardium: There is no evidence of pericardial effusion.   Mitral Valve: The mitral valve is normal in structure. Trivial mitral valve regurgitation.   Tricuspid Valve: The tricuspid valve is normal in structure. Tricuspid valve regurgitation is trivial.   Aortic Valve: The aortic valve is tricuspid. Aortic valve regurgitation is not visualized. The aortic valve is structurally normal, with no evidence of sclerosis or stenosis.   Pulmonic Valve: The pulmonic valve was grossly normal. Pulmonic valve regurgitation is not visualized. Pulmonic regurgitation is not visualized.   Aorta: The aortic root, ascending aorta and aortic arch are all structurally normal, with no evidence of dilitation or obstruction.   Pulmonary Artery: The pulmonary artery is of normal size and origin.   Venous: The inferior vena cava is normal in size with greater than 50% respiratory variability, suggesting right atrial pressure of 3 mmHg.    IAS/Shunts: Possible left to right shunt across intra-atrial septum.  Chest CT 1/20/2021IMPRESSION: 1. Spectrum of findings at the lung bases worrisome for progressive fibrotic interstitial lung disease with mild-to-moderate honeycombing, clearly progressive compared to 2015 and 2019 chest CT studies, with more mild progression since 05/02/2019 chest CT. Findings are consistent with UIP per consensus guidelines: Diagnosis of Idiopathic Pulmonary Fibrosis: An Official ATS/ERS/JRS/ALAT Clinical Practice Guideline. [place], Iss 5, [phone], May 07 2017. 2. Extensive chronic fibrocavitary and fibronodular changes in the right greater than left lung apices with evidence of mycetomas in the large irregular thick walled cavitary region in the apical right lung, which has mildly increased in size. 3. One vessel coronary atherosclerosis. 4. Stable mild right paratracheal lymphadenopathy, most compatible with benign reactive etiology. 5. Small hiatal hernia. 6. Cholelithiasis.   Aortic Atherosclerosis (ICD10-I70.0) and Emphysema (ICD10-J43.9).     Electronically Signed   By: Ilona Sorrel M.D.   On: 09/26/2019 16:43    Chest CTIMPRESSION: 12/2017 1. Widespread areas of bronchiectasis and scarring, generally similar to the prior examination. Two of the thick-walled cavitary areas in the right upper lobe seen on the prior study have resolved, however, there is a new area of scarring in the right upper lobe near the apex which contains some internal soft tissue which is favored to represent some sloughed necrotic lung tissue, although an aspergilloma is not entirely excluded. Close attention on future follow-up imaging is recommended. 2. Possible honeycombing in the extreme lung bases which could indicate early interstitial lung disease. Future follow-up examination should be performed is high-resolution chest CTs to better evaluate these findings. 3.  Cholelithiasis. 4. Aortic atherosclerosis, in addition to 2 vessel coronary artery disease. Please note that although the presence of coronary artery calcium documents the presence of coronary artery disease, the severity of this disease and any potential stenosis cannot be assessed on this non-gated CT examination. Assessment for potential risk factor modification, dietary therapy or pharmacologic therapy may be warranted, if clinically indicated.   Aortic Atherosclerosis (ICD10-I70.0).     Electronically Signed   By: Vinnie Langton M.D.   On: 12/21/2017 13:00   2D echo 4/2019Study Conclusions   - Left ventricle: The cavity size was normal. Wall thickness was   normal. Systolic function was vigorous. The estimated ejection   fraction was in the range of 65% to 70%. Wall motion was normal;   there were no regional wall motion abnormalities. Doppler   parameters are consistent with abnormal left ventricular   relaxation (grade 1  diastolic dysfunction). - Atrial septum: There was an atrial septal aneurysm.   Impressions:   - Vigorous LV systolic function, mild diastolic dysfunction.     Labs/Other Tests and Data Reviewed:    EKG:  No ECG reviewed.  Recent Labs: 05/21/2019: ALT 16; BUN 15; Creat 0.69; Potassium 4.3; Sodium 141 08/13/2019: Hemoglobin 11.2; Platelets 371   Recent Lipid Panel Lab Results  Component Value Date/Time   CHOL 188 09/01/2016 07:43 AM   TRIG 113.0 09/01/2016 07:43 AM   HDL 77.40 09/01/2016 07:43 AM   CHOLHDL 2 09/01/2016 07:43 AM   LDLCALC 88 09/01/2016 07:43 AM    Wt Readings from Last 3 Encounters:  10/02/19 140 lb (63.5 kg)  09/13/19 149 lb 3.2 oz (67.7 kg)  08/13/19 150 lb (68 kg)     Objective:    Vital Signs:  Ht 5\' 3"  (1.6 m)   Wt 140 lb (63.5 kg)   LMP 09/06/2004   BMI 24.80 kg/m    VITAL SIGNS:  reviewed  ASSESSMENT & PLAN:    Shortness of breath with progressive idiopathic pulmonary fibrosis on CT 09/26/2019  Possible  right shunt across the interatrial septum on echo 09/20/2019 LVEF 60 to 65%.  Last echo atrial septal aneurysm was found incidentally on echo. Echo reviewed with Dr. 09/22/2019. Right heart is normal. The only way to definitively diagnose a shunt in her is to do a TEE with bubble study. With her lung disease she would be at higher risk of TEE. Her pulmonary fibrosis has progressed on recent CT which is most likely the cause of her DOE. If Dr. Katrinka Blazing wants [name] to pursue TEE after he sees her on 10/12/19 we will discuss further. Her biggest concern is coughing up blood tinged sputum.  Chronic diastolic CHF compensated  History of tachycardia in the setting of acute illness resolved-no recurrence  Aortic atherosclerosis with one-vessel CAD found on recent CT no chest pain     COVID-19 Education: The signs and symptoms of COVID-19 were discussed with the patient and how to seek care for testing (follow up with PCP or arrange E-visit).  The importance of social distancing was discussed today.  Time:   Today, I have spent 10:45 minutes with the patient with telehealth technology discussing the above problems.     Medication Adjustments/Labs and Tests Ordered: Current medicines are reviewed at length with the patient today.  Concerns regarding medicines are outlined above.   Tests Ordered: No orders of the defined types were placed in this encounter.   Medication Changes: No orders of the defined types were placed in this encounter.   Follow Up:  In Person in 1 year(s) Dr. 12/10/19 Signed, Clifton James, PA-C  10/02/2019 9:35 AM    [facility]

## 2019-10-02 ENCOUNTER — Other Ambulatory Visit: Payer: Self-pay

## 2019-10-02 ENCOUNTER — Telehealth: Payer: Self-pay | Admitting: Internal Medicine

## 2019-10-02 ENCOUNTER — Telehealth (INDEPENDENT_AMBULATORY_CARE_PROVIDER_SITE_OTHER): Payer: 59 | Admitting: Physician Assistant

## 2019-10-02 ENCOUNTER — Encounter: Payer: Self-pay | Admitting: Physician Assistant

## 2019-10-02 VITALS — Ht 63.0 in | Wt 140.0 lb

## 2019-10-02 DIAGNOSIS — R0600 Dyspnea, unspecified: Secondary | ICD-10-CM

## 2019-10-02 DIAGNOSIS — I5032 Chronic diastolic (congestive) heart failure: Secondary | ICD-10-CM

## 2019-10-02 DIAGNOSIS — Q248 Other specified congenital malformations of heart: Secondary | ICD-10-CM | POA: Diagnosis not present

## 2019-10-02 DIAGNOSIS — R911 Solitary pulmonary nodule: Secondary | ICD-10-CM

## 2019-10-02 DIAGNOSIS — I700 Atherosclerosis of aorta: Secondary | ICD-10-CM

## 2019-10-02 DIAGNOSIS — Z87898 Personal history of other specified conditions: Secondary | ICD-10-CM | POA: Diagnosis not present

## 2019-10-02 DIAGNOSIS — R0609 Other forms of dyspnea: Secondary | ICD-10-CM

## 2019-10-02 DIAGNOSIS — J479 Bronchiectasis, uncomplicated: Secondary | ICD-10-CM

## 2019-10-02 DIAGNOSIS — R042 Hemoptysis: Secondary | ICD-10-CM

## 2019-10-02 DIAGNOSIS — B449 Aspergillosis, unspecified: Secondary | ICD-10-CM

## 2019-10-02 NOTE — Telephone Encounter (Signed)
Spoke with the pt  She is wanting to let MR know that this morning she had some darker red blood mixed in with her sputum  She had been coughing up pink tinged sputum, and knows that MR was aware of this, but wanted him to know that there has been a change  She is scheduled for next Thurs 10/11/19 and wanted sooner ov with MR but nothing available  Not having any other symptoms  Please advise thanks

## 2019-10-04 NOTE — Telephone Encounter (Signed)
Please let her know to come and give a sputum for Gram stain, bacterial culture, AFB smear and culture, fungal smear and culture tomorrow October 05, 2019 Friday   After that she can take a 5-day course of Levaquin 500 mg once daily  Allergies  Allergen Reactions  . Bee Venom Anaphylaxis  . Codeine Other (See Comments)    "seeing spots"  . Penicillins Rash    Has patient had a PCN reaction causing immediate rash, facial/tongue/throat swelling, SOB or lightheadedness with hypotension: No Has patient had a PCN reaction causing severe rash involving mucus membranes or skin necrosis: No Has patient had a PCN reaction that required hospitalization No Has patient had a PCN reaction occurring within the last 10 years: Yes If all of the above answers are "NO", then may proceed with Cephalosporin use.

## 2019-10-05 MED ORDER — LEVOFLOXACIN 500 MG PO TABS: 500.0000 mg | ORAL_TABLET | Freq: Every day | ORAL | 0 refills | Status: DC

## 2019-10-05 NOTE — Telephone Encounter (Signed)
Called the patient and advised her of the response from Dr. [name]. She stated she picked up the sputum cups. I advised her not to start taking the medication until after she has provided the samples needed in the cups so as to not alter the results. Nothing further needed.

## 2019-10-05 NOTE — Telephone Encounter (Signed)
Dr. [name], I called the patient back and confirmed she will be able to come by to provide sample for culture.  The orders have been placed.  Just wanted to clarify if you wanted the Levaquin sent the pharmacy today, OR after the culture results have been received?

## 2019-10-05 NOTE — Telephone Encounter (Signed)
Send to pharmacy today

## 2019-10-08 ENCOUNTER — Other Ambulatory Visit: Payer: 59

## 2019-10-08 DIAGNOSIS — J479 Bronchiectasis, uncomplicated: Secondary | ICD-10-CM

## 2019-10-08 DIAGNOSIS — R911 Solitary pulmonary nodule: Secondary | ICD-10-CM

## 2019-10-08 DIAGNOSIS — R042 Hemoptysis: Secondary | ICD-10-CM

## 2019-10-08 DIAGNOSIS — B449 Aspergillosis, unspecified: Secondary | ICD-10-CM

## 2019-10-11 ENCOUNTER — Telehealth: Payer: Self-pay | Admitting: General Surgery

## 2019-10-11 ENCOUNTER — Other Ambulatory Visit: Payer: Self-pay | Admitting: General Surgery

## 2019-10-11 ENCOUNTER — Ambulatory Visit (INDEPENDENT_AMBULATORY_CARE_PROVIDER_SITE_OTHER): Payer: 59 | Admitting: Internal Medicine

## 2019-10-11 ENCOUNTER — Other Ambulatory Visit: Payer: Self-pay

## 2019-10-11 ENCOUNTER — Encounter: Payer: Self-pay | Admitting: Internal Medicine

## 2019-10-11 VITALS — BP 122/58 | HR 91 | Temp 97.4°F | Ht 61.0 in | Wt 153.0 lb

## 2019-10-11 DIAGNOSIS — J479 Bronchiectasis, uncomplicated: Secondary | ICD-10-CM

## 2019-10-11 DIAGNOSIS — K5900 Constipation, unspecified: Secondary | ICD-10-CM

## 2019-10-11 DIAGNOSIS — M069 Rheumatoid arthritis, unspecified: Secondary | ICD-10-CM

## 2019-10-11 DIAGNOSIS — M359 Systemic involvement of connective tissue, unspecified: Secondary | ICD-10-CM

## 2019-10-11 DIAGNOSIS — B449 Aspergillosis, unspecified: Secondary | ICD-10-CM

## 2019-10-11 DIAGNOSIS — R011 Cardiac murmur, unspecified: Secondary | ICD-10-CM

## 2019-10-11 DIAGNOSIS — J8489 Other specified interstitial pulmonary diseases: Secondary | ICD-10-CM | POA: Diagnosis not present

## 2019-10-11 DIAGNOSIS — R042 Hemoptysis: Secondary | ICD-10-CM

## 2019-10-11 DIAGNOSIS — B441 Other pulmonary aspergillosis: Secondary | ICD-10-CM

## 2019-10-11 DIAGNOSIS — Q248 Other specified congenital malformations of heart: Secondary | ICD-10-CM

## 2019-10-11 NOTE — Progress Notes (Addendum)
OV 03/04/2014 Chief Complaint  Patient presents with  . Follow-up    HFU-D/c from [facility]. Pt states she has chest soreness from coughing. Pt states with the O2 her breathing has improved. C/o dyspnea when working with PT and cough with yellow mucous.    Admitted 02/16/2014 through 02/24/2014 with right-sided pneumococcal pneumonia with cavitation in the setting of smoking and rheumatoid arthritis on immune modulators. At this point in time she is off the immunomodulators. Dr. Amil Amen her rheumatologist is aware of this. She is 25% back to her baseline but still very fatigued and short of breath. She coughs a lot with home physical therapy. She brings up green mucus and sputum with her cough needed overall cough is significantly improved since discharge. Appetite is slowly returning. She works as a Art gallery manager at [employer] and does not want to go to work till she is fully off oxygen. She is requesting to stay off work for several weeks. On 2 L there is documentation that when she walks 2 minutes up saturation still stays at 95%.   Today in the office pon room air at rest t it is 93%. Walked 40 feet and desaturated to 89% but HR 150/min per tech: I think this is all deconditioning but not sure if she has cardiomyopathy from her illness   04/02/14 Follow up  Pt returns for follow up from recent sever critical illness from streptococcal PNA , Bacteremia, sepsis 02/2014.   She had a right-sided pneumococcal pneumonia with cavitation in the setting of smoking and rheumatoid arthritis on immune modulators Today PFT show FEV1 61% , ratio 87  , FVC 55 %  , NO sign BD response, Diffusing capacity  Decreased 57% Has not smoked.  Wants to return to work soon.  CXR shows persistent RUL/LLL cavitary lesions .  Says she is feeling so much better.  No fever, chest pain, orthopnea, edema or n/v/d.  Good appetite . Seen by cards for tachycardia felt secondary to recent PN   OV 05/01/2014  Chief  Complaint  Patient presents with  . Follow-up    Pt states her breathing has improved since last OV. Pt c/o prod cough with yellow and clear mucous and lower mid lumbar pain, pt thinks d/t RA. Pt doing duoneb only once a day.  Pt denies SOB.     Admitted 02/16/2014 through 02/24/2014 with right-sided pneumococcal pneumonia with cavitation in the setting of smoking and rheumatoid arthritis on immune modulators. Current followup is for the same   S: Presents with husband. Feels great overall. No fatigute. No dyspnea. Able to do all ADLs. Wants to return to work now and is asking for release to work. In terms of RA, she feels early morning stiffness of hand joint is returning. SHe is seeing Dr Amil Amen 05/01/14 and will discuss with him about return to immunomodulators. There are no other issues. CXR done after she left and at time of this note:    Dg Chest 2 View  04/30/2014   CLINICAL DATA:  Followup cavitary pneumonia  EXAM: CHEST  2 VIEW  COMPARISON:  04/02/2014  FINDINGS: Normal heart size, mediastinal contours, and pulmonary vascularity.  Patchy infiltrates are identified in RIGHT upper lobe and LEFT lower lobe consistent with pneumonia.  Cavitary foci identified previously in the in both the RIGHT upper and LEFT lower lobes are still seen, the less evident in the RIGHT upper lobe.  Scattered interstitial prominence again identified.  No gross pleural  effusion or pneumothorax.  Bones unremarkable.  IMPRESSION: Persistent infiltrates are identified in the RIGHT upper lobe and LEFT lower lobe with persistent visualization of cavitary foci.   Electronically Signed   By: Lavonia Dana M.D.   On: 04/30/2014 19:09      OV 08/09/2014  Chief Complaint  Patient presents with  . Follow-up    Pt here after CT scan. Pt denies change in breathing since last OV. Pt c/o prod cough with clear and green mucus.     Presents for Pneumococcal pneumonia followup that developed in setting of RA and Simponi  She  is being maintained on ARAVA since her pneumococcal pneumonia earlier in 2013.  Now she is free of symptoms of respiratory tree. CT 08/08/14 shows further improvement but has architectural distortion. HEr RA is now significantly  active - per her and my talking 08/09/2014 with Dr Amil Amen. Patient is very apprehensive of starting ORENCIA recommended by Dr Amil Amen due to increased aecopd risk (She does not have copd), recurrent infection esp in setting of focal archiectrual distortion and recent pneumonia. She is very scared of dealing with RA disability v infection risk   Social and past and family: Mom had RA. She is upset abotu her disease  CT 08/08/14  IMPRESSION: 1. Emphysema with scattered scarring, volume loss, scattered bulla (some of which are still inflamed and with a somewhat cavitary appearance), and bilateral airway thickening. Much of this represents residua from prior multi lobar cavitary pneumonia, and is improved compared to the prior July CT scan. Given the degree of architectural distortion, scattered nodularity and opacities, and and irregular scarring, CT is not able to clear the lung of the possibility of malignancy, and surveillance imaging may be warranted.   Electronically Signed  By: Sherryl Barters M.D.  On: 08/08/2014 15:32  OV 11/06/2014  Chief Complaint  Patient presents with  . Follow-up    Coughing up whitish colored phlegm, SOB with walking, not always, sometimes.      Presents for Pneumococcal pneumonia followup that developed in setting of RA and Simponi June 2015, with resultant architectural distortion in her lung anatomy  Last seen December 2015. Since then overall she's doing well. At baseline she only has mild cough with occasional white sputum. She also has exertional dyspnea when she climbs up a hill but clearly in cold air he did this is relieved by rest. She is currently on 2 immunomodulators for her rheumatoid arthritis Orencia and Arava and  this is helping her joints. She's not had any respiratory complications so far. I notice that she is not on Bactrim prophylaxis but she is concerned about the side effects. And she wants to talk to her rheumatologist Dr. Amil Amen about this  Past, Family, Social reviewed: no change since last visit   OV 02/10/2015  Chief Complaint  Patient presents with  . Follow-up    Pt stated her breathing feels it has improved since last OV. Pt here after PFT. Pt stated she has a little more trouble breathing when it is hot out. Pt c/o mild prod cough with clear and small light green mucus. Pt denies CP/tightness.    Follow-up chronic destructive pulmonary to changes following pneumococcal pneumonia in 2015 in the setting of previous heavy smoking and immunomodulatory treatment for rheumatoid arthritis   Since seeing me last in March 2016 she feels that the albuterol inhaler that I tried for dyspnea and exertion and wheezing has helped. Dyspnea still persists. It is unchanged. It is  mild. However it is improved with albuterol which she takes prior to exertion. There is no associated cough or. Wheezing is improved. No hemoptysis no fever no chills .Outside chart from Dr. Amil Amen her rheumatologist dated 02/04/2015 reviewed. She reported to him that she is compliant on Ornecia and arava with good relief and joint pain. Her dyspnea is mild chronic and stable. His advice for her to continue the same. He suggested that I look into her chronic residual dyspnea at this visit   Last pulmonary imaging with CT scan of the chest December 2015:residual changes + along with emphysema; report reviewed. Image not visualized  Pulmonary function test 02/10/2015 today personally reviewed image: FEV1 prebronchodilator 1.7 L/64%, ratio 69.  FEV1 postbronchodilator is 1.9 L/72% and is a 12% positive response with broncho-dilator., FVC 2.4 L/72% and ratio of 79. Total lung capacity is 3.76 L/76%. DLCO is 18.5/80%. PFTs are consistent  with restriction and a normal DLCO but in the prebronchodilator she has mild obstruction   OV 08/11/2015  Chief Complaint  Patient presents with  . Follow-up    Pt states her breathing has improved since last OV. Pt states she has only been using the spiriva as needed. Pt c/o prod cough with clear mucus and DOE with heavy exertion. pt denies CP/tightness.     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 She continues to do well. She is now on leflunomide and Orencia for her rheumatoid arthritis. With this rheumatoid arthritis symptoms have improved. She is follows up with Dr. Amil Amen in his new rheumatology practice [facility]. She feels that her wheezing has significantly improved. Shortness of breath significantly improved. She is not taking Spiriva anymore. She reported some hoarseness with it. She wants to continue to watch herself without Spiriva. She is up-to-date with her flu shot.   OV 02/09/2016  Chief Complaint  Patient presents with  . Follow-up    Pt reports that her breathing is doing well. Pt c/o occasional dry cough that is worse when using a vape for recreational use. Pt also c/o occasional wheeze with hot, humid weather and exertion. Pt does use albuterol HFA and her symptoms improve. Pt denies SOB/CP/tightness.        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 Doing well but has started smoking again though is nicotine-free vape smoking now. She did not want spiriva due to dpi and throat irritation. But she is using albuterol atleast daily. She is having exertional wheeze but otherwise okayt. Maureen Chatters  continues with Dr Amil Amen. She tells me she never had shingles vaccine. Otherwise well. Working at [employer] . Does not want cxr 02/09/2016 for fu. Wants to defer till next visit   OV 12/08/2017  Chief Complaint  Patient presents with  . Follow-up    Last seen 02/09/16.  Pt states she has been doing well since last visit and denies any complaints.      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  She went through a separation and divorce and this time.  Smoking continues  to be in remission.  At last visit she was smoking but now is in remission.  She continues to work at the [employer].  For her rheumatoid arthritis she has been switched out of orcencia to [place] and this is working well for her.  She only has some mild pain.  In terms of her respiratory symptoms she hardly has any symptoms.  Definitely no cough.  She has mild dyspnea on exertion when working out in the yard but this is stable.  Review of the chart shows that her last echocardiogram was in 2015.  Her last chest x-ray or imaging was in 2016 and so was a pulmonary function test.  She wants to make sure her lungs are doing okay at this point.   OV 01/27/2018  Chief Complaint  Patient presents with  . Follow-up    Breathing is improved since last OV. Still has some coughing in the mornings. Denies chest tightness, wheezing.   Kimberly Shelton presents for follow-up after testing.  In the interim she continues to be stable.  She only has mild dyspnea on exertion when she does gardening.  For this she uses albuterol as needed she does not want to upscale on her inhalers.  Testing showed atrial septal aneurysm and coronary artery calcification.  Cardiology has reassured her on both accounts.  In terms of the lung function that seems to be  a decline in FEV1 compared to 2016 but very similar to 2015.  DLCO is stable all along.  She feels stable.  CT scan of the chest shows improved bronchiectasis compared to few years ago.  The only issue is that in the right upper lobe that seems to be a cavity with a potential aspergilloma although this is totally uncertain at this stage.  She does not have any hemoptysis or any symptoms.  She is immunocompromised on rheumatoid arthritis medication Orencia at this point.  Apparently the Morrie Sheldon was stopped.   Results for Kimberly, Shelton (MRN [id number]) as of 01/27/2018 10:48  Ref. Range 04/02/2014 11:42 02/10/2015 13:49 01/27/2018 09:57  FEV1-Post Latest Units: L 1.68 1.89 1.63  FEV1-%Pred-Post Latest Units: % 64 73 64  FEV1-%Change-Post Latest Units: % _0 Results for Kimberly, Shelton (MRN [id number]) as of 01/27/2018 10:48  Ref. Range 04/02/2014 11:42 02/10/2015 13:49 01/27/2018 09:57  DLCO unc Latest Units: ml/min/mmHg 13.15 18.50 18.02  DLCO unc % pred Latest Units: % 57 80 78    IMPRESSION: CT chest 1. Widespread areas of bronchiectasis and scarring, generally similar to the prior examination. Two of the thick-walled cavitary areas in the right upper lobe seen on the prior study have resolved, however, there is a new area of scarring in the right upper lobe near the apex which contains some internal soft tissue which is favored to represent some sloughed necrotic lung tissue, although an aspergilloma is not entirely excluded. Close attention on future follow-up imaging is recommended. 2. Possible honeycombing in the extreme lung bases which could indicate early interstitial lung disease. Future follow-up examination should be performed is high-resolution chest CTs to better evaluate these findings. 3. Cholelithiasis. 4. Aortic atherosclerosis, in addition to 2 vessel coronary artery disease. Please note that although the presence of coronary artery calcium documents the presence  of coronary artery disease, the severity of this disease and any potential stenosis cannot be assessed on this non-gated CT examination. Assessment for potential risk factor modification, dietary therapy or pharmacologic therapy may be warranted, if clinically indicated.  Aortic Atherosclerosis (ICD10-I70.0).   Electronically Signed   By: Vinnie Langton M.D.   On: 12/21/2017 13:00   ECHO 12/12/17 Study Conclusions  - Left ventricle: The cavity size was normal. Wall thickness was   normal. Systolic function was vigorous. The estimated ejection   fraction was in the range of 65% to 70%. Wall motion was normal;   there were no regional wall motion abnormalities. Doppler   parameters are consistent with abnormal left ventricular   relaxation (grade 1 diastolic dysfunction). - Atrial septum: There was an atrial septal aneurysm.  Impressions:  - Vigorous LV systolic function, mild diastolic dysfunction.  OV 08/11/2018  Subjective:  Patient ID: Kimberly Shelton, female , DOB: [birth year] , age 9 y.o. , MRN: [id number] , ADDRESS: [place] [id number]   08/11/2018 -   Chief Complaint  Patient presents with  . Follow-up    f/u bronchiectasis, PFT today, pt doing well , only cough in tha am with little mucus     HPI Kimberly Shelton 59 y.o. -presents for follow-up of bronchiectasis suffered in 2015 following really severe pneumococcal pneumonia in the setting of immunosuppression due to rheumatoid arthritis.  This routine follow-up.  When I saw her in the spring 2019 we did a CT scan of the chest and there was suggestion of possible aspergillus cavity.  Did Aspergillus antibody and she was positive for IgG.  Therefore referred her to infectious diseases.  I reviewed the note.  Given overall stability expectant approach has been adopted.  At this point in time she feels stable using albuterol as needed.  Vaccine records indicate she could benefit from both  shingles vaccine and Prevnar.  She seems to be under the assumption that shingles vaccine is only after age 52.  I reviewed the literature and it is actually indicated for people over 50.  However she should only get the new Shingrix vaccine which is inactivated.  Prevnar is also indicated for her given her previous pneumococcal pneumonia and her immunosuppressed status.  Coronary artery calcification on CT scan: This was seen in April 2019.  Since then she did see cardiology.  They are aware of this and the place her on follow-up.  She does not have any chest pain currently.    OV 09/13/2019  Subjective:  Patient ID: Kimberly Shelton, female , DOB: 03/21/1961 , age 70 y.o. , MRN: [id number] , ADDRESS: [place] [id number]   09/13/2019 -   Chief Complaint  Patient presents with  . Follow-up    Pt states she has been spitting up bloody mucus every morning and states her throat is also raw feeling. Pt was diagnosed with covid 11/19 and did okay with that other than losing her sense of taste and smell.   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 Gerri Spore  HPI Kimberly Shelton 59 y.o. -returns for follow-up.  Last seen in December 2019.  After that in the summer 2020 CT scan of the chest showed progression.  I referred her to Dr. Scharlene Gloss who placed her on voriconazole.  She is currently finished her treatment with voriconazole but she tells me for the last 6 months she continues to have early morning mild hemoptysis versus pink sputum.  She says  that despite the voriconazole this persist.  In early November 2020/mid November 2020 she picked up COVID-19 while singing in a church along with her sister who also had COVID-19.  One of the  person died in that cluster.  That happens to be my patient.  She tells me since the COVID-19 for which she did not get hospitalized she has had slight increase in cough and slight increase in hemoptysis and ongoing fatigue.  For her rheumatoid arthritis she is only on prednisone she is unable to take any immunomodulators because of her pulmonary issues.  The disease has gotten progressive with the onset of rheumatoid nodules in her bilateral elbows.  She has gained weight in the pandemic because of being sedentary.   Review of the chart indicates last echo was in 2019.  She has a murmur.  Her last CT scan of the chest was in August 2020.   She wants to know if it is okay to go back on immunosuppressive's.- d/w Dr Amil Amen -  DMARD prob ok but not biologics   OV 10/11/2019  Subjective:  Patient ID: Kimberly Shelton, female , DOB: [birth year] , age 22 y.o. , MRN: [id number] , ADDRESS: [place] [id number]   10/11/2019 -   Chief Complaint  Patient presents with  . 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 Gerri Spore  #small hiatl hernia  #coronary artery calcification and baseline heart murmur - dr Angelena Form   HPI Kimberly Shelton 59 y.o. -presents to discuss the test results in the setting of above medical issues.  She again tells me that she started having hemoptysis 6 months ago or so.  She was then given voriconazole.  She says with that the pink slight hemoptysis did not resolve.  Then she finished an adequate course of voriconazole but the light streaky pink hemoptysis is continued.  Then several days ago she had like one dark clot.  She is now given that sputum for  AFB cultures are pending smear is negative.  Otherwise new problem is she is constipated for 3 days.  She is also taking her prednisone at 5 mg/day because it is causing increased weight gain.  Her arthralgia is worse.  She plans to see rheumatology in a week or 2.  New issue is constipation for the last 3 days.  However in review of the chart she has had constipation in the past but she is expressing it to me for the first time.  She also wants treatment for this.  In terms of CT scan of the chest: Radiologist calling for presence of UIP/ILD with progression.  In addition she has a fibrocavitary fibronodular changes with aspergilloma.  She also has coronary artery calcification  Echocardiogram shows left ventricular hypertrophy with normal ejection fraction but there is a description of left to right interatrial shunt.  I think this is a new finding.  IMPRESSION Jan 2021 1. Spectrum of findings at the lung bases worrisome for progressive fibrotic interstitial lung disease with mild-to-moderate honeycombing, clearly progressive compared to 2015 and 2019 chest CT studies, with more mild progression since 05/02/2019 chest CT. Findings are consistent with UIP per consensus guidelines: Diagnosis of Idiopathic Pulmonary Fibrosis: An Official  ATS/ERS/JRS/ALAT Clinical Practice Guideline. Am Rosezetta Schlatter Crit Care Med Vol 198, Iss 5, [phone], May 07 2017. 2. Extensive chronic fibrocavitary and fibronodular changes in the right greater than left lung apices with evidence of mycetomas in the large irregular thick walled cavitary region in the apical right lung, which has mildly increased in size. 3. One vessel coronary atherosclerosis. 4. Stable mild right paratracheal lymphadenopathy, most compatible with benign reactive etiology. 5. Small hiatal hernia. 6. Cholelithiasis.  Aortic Atherosclerosis (ICD10-I70.0) and Emphysema (ICD10-J43.9).   Electronically Signed   By: Delbert Phenix M.D.   On:  09/26/2019 16:43  ECHO Jan 2021  -- LVH ef 65% ROS - per HPI     has a past medical history of Abnormal Pap smear (09/2006), CHF (congestive heart failure) (HCC), Foot fracture, left (10/16), Pneumonia, and RA (rheumatoid arthritis) (HCC) (07/2009).   reports that she quit smoking about 5 years ago. Her smoking use included cigarettes and e-cigarettes. She has a 30.00 pack-year smoking history. She has never used smokeless tobacco.  Past Surgical History:  Procedure Laterality Date  . CERVICAL BIOPSY  W/ LOOP ELECTRODE EXCISION  2008   CIN 3  . CERVICAL CONIZATION W/BX N/A 12/15/2015   Procedure: CONIZATION CERVIX WITH BIOPSY ;  Surgeon: Jerene Bears, MD;  Location: [facility];  Service: Gynecology;  Laterality: N/A;  POSSIBLE COLD KNIFE CONE.  Please have the microscope in the room  . COLONOSCOPY    . LEEP N/A 12/15/2015   Procedure: LOOP ELECTROSURGICAL EXCISION PROCEDURE (LEEP) with colpo;  Surgeon: Jerene Bears, MD;  Location: [facility];  Service: Gynecology;  Laterality: N/A;  . PILONIDAL CYST EXCISION  1999   I&D  . WISDOM TOOTH EXTRACTION      Allergies  Allergen Reactions  . Bee Venom Anaphylaxis  . Codeine Other (See Comments)    "seeing spots"  . Penicillins Rash    Has patient had a PCN reaction causing immediate rash, facial/tongue/throat swelling, SOB or lightheadedness with hypotension: No Has patient had a PCN reaction causing severe rash involving mucus membranes or skin necrosis: No Has patient had a PCN reaction that required hospitalization No Has patient had a PCN reaction occurring within the last 10 years: Yes If all of the above answers are "NO", then may proceed with Cephalosporin use.     Immunization History  Administered Date(s) Administered  . Influenza Inj Mdck Quad Pf 05/25/2019  . Influenza,inj,Quad PF,6+ Mos 06/25/2015, 05/31/2017, 05/29/2018  . Influenza-Unspecified 06/06/2014  . Pneumococcal Conjugate-13 08/11/2018  . Pneumococcal  Polysaccharide-23 02/18/2014  . Tdap 09/01/2016    Family History  Problem Relation Age of Onset  . Diabetes Mother   . Rheum arthritis Mother   . Diabetes Father   . COPD Father   . Bronchitis Father   . Cancer Brother        Lung  . Breast cancer Maternal Aunt 8  . CAD Neg Hx      Current Outpatient Medications:  .  benzonatate (TESSALON) 200 MG capsule, Take 1 capsule (200 mg total) by mouth 3 (three) times daily as needed for cough., Disp: 30 capsule, Rfl: 1 .  escitalopram (LEXAPRO) 10 MG tablet, TAKE 1 TABLET BY MOUTH EVERY DAY, Disp: 90 tablet, Rfl: 2 .  naproxen sodium (ALEVE) 220 MG tablet, Take 220 mg by mouth daily as needed., Disp: , Rfl:  .  predniSONE (DELTASONE) 5 MG tablet, Take 5 mg by mouth 2 (two) times daily., Disp: , Rfl:  .  PROAIR HFA 108 (90 Base) MCG/ACT inhaler, IHALE 2 PUFFS INTO THE LUNGS EVERY 6 HOURS AS NEEDED FOR WHEEZING OR SHORTNESS OF BREATH, Disp: 8.5 g, Rfl: 3      Objective:   Vitals:   10/11/19 1149  BP: (!) 122/58  Pulse: 91  Temp: (!) 97.4 F (36.3 C)  SpO2: 97%  Weight: 153 lb (69.4 kg)  Height: 5\' 1"  (1.549 m)    Estimated body mass index is 28.91 kg/m as calculated from the following:   Height as of this encounter: 5\' 1"  (1.549 m).   Weight as of this encounter: 153 lb (69.4 kg).  @WEIGHTCHANGE @  [employer]   10/11/19 1149  Weight: 153 lb (69.4 kg)     Physical Exam Pleasant female with obvious rheumatoid arthritis deformity and nodules in the elbow.  Distant breath sounds.  Alert and oriented x3 cardiac murmur present.       Assessment:       ICD-10-CM   1. Hemoptysis  R04.2   2. Pulmonary aspergilloma (HCC)  B44.9   3. Bronchiectasis without complication ([place])  H29.9   4. Interstitial lung disease due to connective tissue disease ([place])  J84.89    M35.9   5. Rheumatoid arthritis involving multiple joints (HCC)  M06.9   6. Interatrial cardiac shunt  Q24.8   7. Cardiac murmur  R01.1   8. Constipation,  unspecified constipation type  K59.00    I think she needs a bronchoscopy but given the descriptions of the COVID-19 pandemic the significant delays in getting it.  Perhaps it may be prudent to restart her voriconazole.  I have to check with Dr. Linus Salmons  about this.  Meanwhile she should see the rheumatologist and get started possibly on Plaquenil for arthralgia and some DMARD but definitely not biologic.  Also get a cardiac status evaluated given the coronary artery calcification and the possible shunt described in the echocardiogram.  For constipation we will do over-the-counter medications.   D/w Dr Linus Salmons post visit - > patient got full course of Voriconazole and expectant approach favored currently    Plan:     Patient Instructions  Hemoptysis  - mild and ongoing and due to below lung issues  Plan  - await sputum AFB end jan 2021  - need bronchoscpy with lavage but delays due to covid-19  - so will hold off for now  - any massive or major coughing up blood - go to ER  Pulmonary aspergilloma (HCC) Bronchiectasis without complication ([place])  - re-refer Dr Linus Salmons - I think you might need to go back on voricanazole esp with delays in bronchoscopy and oingoing coughing blood and possible need to go back on RA medications again  -Make the appointment today with him  Interstitial lung disease due to connective tissue disease ([place]) - new  - you seem to have developed this new on CT scan of the chest in January 2021  Plan -We will discuss you at case conference for pulmonary interstitial lung disease in February 2021 -Get spirometry and DLCO in April 2021 -Return to see Dr. Chase Caller in December 24 2128-minute slot ILD clinic preferred -to discuss possible antifibrotic start  Rheumatoid arthritis involving multiple joints ([place])  -This appears active given the onset of pulmonary nodules and new elbows and also interstitial lung disease in the lung and active joint pain  Plan -Okay to start  Plaquenil if Dr Amil Amen thinks it will help - pls d/w Dr Amil Amen -Keep your prednisone at  5 mg/day -Avoid biologic treatment for your rheumatoid arthritis but probably okay to start leflunomide  Interatrial cardiac shunt-this appears new on the echocardiogram January 2021 Cardiac murmur -old Coronary artery calcification-old  Plan -Refer Dr. Verne Carrow her cardiologist -make the appointment today  Constipation-new -Take over-the-counter senna docusate 1-2 tablets once daily as needed -Over-the-counter fiber tablets or powder mixed in water or milk -If persists talk to primary care physician   Follow-up -April 2021 with spirometry and DLCO -30-minute slot in April 2021 ILD clinic referred    ( Level 05 visit: Estb 40-54 min   in  visit type: on-site physical face to visit  in total care time and counseling or/and coordination of care by this undersigned MD - Dr Kalman Shan. This includes one or more of the following on this same day 10/11/2019: pre-charting, chart review, note writing, documentation discussion of test results, diagnostic or treatment recommendations, prognosis, risks and benefits of management options, instructions, education, compliance or risk-factor reduction. It excludes time spent by the CMA or office staff in the care of the patient. Actual time 45 min)    SIGNATURE    Dr. Kalman Shan, M.D., F.C.C.P,  Pulmonary and Critical Care Medicine Staff Physician, [facility] Director - Interstitial Lung Disease  Program  Pulmonary Fibrosis [facility] at [facility] [place], [place], [address]  Pager: [phone], If no answer or between  15:00h - 7:00h: call [phone] Telephone: [phone]  5:25 PM 10/11/2019

## 2019-10-11 NOTE — Telephone Encounter (Signed)
Dr. [name],  This patient saw you this morning. You had noted on the AVS she was to start Plaquenil. Did you want her on 200 mg or 400 mg. Also how many times a day?  Irving Burton, I copied you on this because I am out of the office tomorrow, just in case.

## 2019-10-11 NOTE — Patient Instructions (Addendum)
Hemoptysis  - mild and ongoing and due to below lung issues  Plan  - await sputum AFB end jan 2021  - need bronchoscpy with lavage but delays due to covid-19  - so will hold off for now  - any massive or major coughing up blood - go to ER  Pulmonary aspergilloma (HCC) Bronchiectasis without complication (HCC)  - re-refer Dr Luciana Axe - I think you might need to go back on voricanazole esp with delays in bronchoscopy and oingoing coughing blood and possible need to go back on RA medications again  -Make the appointment today with him  Interstitial lung disease due to connective tissue disease (HCC) - new  - you seem to have developed this new on CT scan of the chest in January 2021  Plan -We will discuss you at case conference for pulmonary interstitial lung disease in February 2021 -Get spirometry and DLCO in April 2021 -Return to see Dr. [name] in December 24 2128-minute slot ILD clinic preferred -to discuss possible antifibrotic start  Rheumatoid arthritis involving multiple joints (HCC)  -This appears active given the onset of pulmonary nodules and new elbows and also interstitial lung disease in the lung and active joint pain  Plan -Okay to start Plaquenil if Dr Dierdre Forth thinks it will help - pls d/w Dr Dierdre Forth -Keep your prednisone at 5 mg/day -Avoid biologic treatment for your rheumatoid arthritis but probably okay to start leflunomide  Interatrial cardiac shunt-this appears new on the echocardiogram January 2021 Cardiac murmur -old Coronary artery calcification-old  Plan -Refer Dr. Verne Carrow her cardiologist -make the appointment today  Constipation-new -Take over-the-counter senna docusate 1-2 tablets once daily as needed -Over-the-counter fiber tablets or powder mixed in water or milk -If persists talk to primary care physician   Follow-up -April 2021 with spirometry and DLCO -30-minute slot in April 2021 ILD clinic referred

## 2019-10-11 NOTE — Telephone Encounter (Signed)
She can start ith wth Dr Dierdre Forth. Sorry I will make it more clear

## 2019-10-12 ENCOUNTER — Telehealth: Payer: Self-pay | Admitting: Internal Medicine

## 2019-10-12 NOTE — Telephone Encounter (Signed)
Called and spoke with pt letting her know info stated by MR after [facility] sent message to MR yesterday 2/4 after OV about the plaquenil that MR said she could start with Dr. Dierdre Forth first.  After stating that to pt, pt stated that she does not see Dr. Dierdre Forth until 2/19 and stated she cannot wait that long before beginning a med like plaquenil to possibly help her out. Pt stated she is in a lot of pain and stated something needs to be done to help with this.  Pt would really like to begin on plaquenil much sooner than her scheduled OV with Dr. Dierdre Forth on 2/19 if able.  Dr. [name], please advise on this.

## 2019-10-12 NOTE — Telephone Encounter (Signed)
Called and spoke with pt letting her know the info stated by MR. Pt stated she would call Dr. Shawnee Knapp office Monday to see if she might be able to be seen sooner than the 19th for an appt or to see if he would be willing to start her on the plaquenil prior to her OV. Nothing further needed.

## 2019-10-12 NOTE — Telephone Encounter (Signed)
Pt calling because the Plaquenil has not been called in. Pt seen Dr. [name] on 2.4. Patient uses [facility] in [place]. Pt can be reached [phone]

## 2019-10-12 NOTE — Telephone Encounter (Signed)
It is a medicine commonly prescribed a rheumatologist.  Therefore she should call Dr. Shawnee Knapp office.  Generally safe but sometimes he might have to check a blood test called G6PD which he might have already done.  Also he needs to take that in the context with the rest of her disease and medications.  She can call his office ahead of the visit and try to see if he is willing to start that on the phone

## 2019-10-15 NOTE — Telephone Encounter (Signed)
Dr. [name],   Please clarify the response you sent. The original message sent to you was about Plaquenil. Thank you.

## 2019-10-16 ENCOUNTER — Ambulatory Visit: Payer: Self-pay | Admitting: Internal Medicine

## 2019-10-16 DIAGNOSIS — M0510 Rheumatoid lung disease with rheumatoid arthritis of unspecified site: Secondary | ICD-10-CM

## 2019-10-16 DIAGNOSIS — J8489 Other specified interstitial pulmonary diseases: Secondary | ICD-10-CM

## 2019-10-22 ENCOUNTER — Ambulatory Visit: Payer: 59 | Admitting: Internal Medicine

## 2019-10-24 ENCOUNTER — Other Ambulatory Visit: Payer: Self-pay

## 2019-10-24 ENCOUNTER — Ambulatory Visit (INDEPENDENT_AMBULATORY_CARE_PROVIDER_SITE_OTHER): Payer: 59 | Admitting: Internal Medicine

## 2019-10-24 ENCOUNTER — Encounter: Payer: Self-pay | Admitting: Internal Medicine

## 2019-10-24 VITALS — BP 133/76 | HR 77 | Temp 98.0°F | Ht 64.0 in | Wt 151.0 lb

## 2019-10-24 DIAGNOSIS — B449 Aspergillosis, unspecified: Secondary | ICD-10-CM

## 2019-10-24 DIAGNOSIS — Z87891 Personal history of nicotine dependence: Secondary | ICD-10-CM | POA: Diagnosis not present

## 2019-10-24 DIAGNOSIS — J984 Other disorders of lung: Secondary | ICD-10-CM

## 2019-10-24 MED ORDER — VORICONAZOLE 200 MG PO TABS: 200.0000 mg | ORAL_TABLET | Freq: Two times a day (BID) | ORAL | 2 refills | Status: DC

## 2019-10-24 NOTE — Progress Notes (Signed)
Patient ID: Kimberly Shelton, female   DOB: June 02, 1961, 59 y.o.   MRN: [id number]   Subjective:    Patient ID: Kimberly Shelton, female    DOB: 01-Oct-1960, 59 y.o.   MRN: [id number]  HPI Here for follow up for pulmonary cavitary lesions.  I saw her last year and noted bronchiectasis and had a positive aspergillus Ig positive with negative Ag and no growth on sputum culture.  She had been relatively asymptomatic.  She more recently though has developed hemoptysis and a follow up CT scan with cavitary areas.  I put her on voriconazole and she took this for 3 months.  A repeat CT scan showed some improvement.    I did a televisit with her 11/25 and she noted some blood-tinged sputum.  Also talked to [name], NP at the pulmonary clinic.  Since then, it has mainly been light pink sputum, no overt blood.  No worsening symptoms. She completed prolonged voriconazole but returned to Dr. Chase Caller and had some progression of symptoms and follow up CT with mild progression.  He is considering BAL when that is available.     Review of Systems  Constitutional: Negative for chills and fever.  Respiratory: Negative for cough, shortness of breath, wheezing and stridor.   Skin: Negative for rash.       Objective:   Physical Exam Constitutional:      Appearance: Normal appearance.  Eyes:     General: No scleral icterus. Pulmonary:     Effort: Pulmonary effort is normal. No respiratory distress.  Skin:    Findings: No rash.  Neurological:     Mental Status: She is alert.  Psychiatric:        Mood and Affect: Mood normal.    SH: not vaping       Assessment & Plan:

## 2019-10-24 NOTE — Assessment & Plan Note (Signed)
I agree that a BAL may help identify any organism involved and will defer to Dr. Chase Caller if and when that should be done.

## 2019-10-24 NOTE — Assessment & Plan Note (Signed)
It is still unclear if aspergillous is the causitive organism but radiographically some worsening since August, now two months after stopping the voriconazole.  Clinically though she is about the same and clearly has other issues in her lungs that are non-infectious.  However, she did tolerate the voriconazole well and certainly could be part of her process so in discussion with her I will have her restart the voriconazole for a planned 3 months.

## 2019-10-24 NOTE — Assessment & Plan Note (Signed)
Not smoking or vaping now.

## 2019-10-25 ENCOUNTER — Telehealth: Payer: Self-pay | Admitting: Pharmacist

## 2019-10-25 LAB — COMPLETE METABOLIC PANEL WITH GFR
AG Ratio: 1.3 (calc) (ref 1.0–2.5)
ALT: 6 U/L (ref 6–29)
AST: 12 U/L (ref 10–35)
Albumin: 4 g/dL (ref 3.6–5.1)
Alkaline phosphatase (APISO): 100 U/L (ref 37–153)
BUN: 14 mg/dL (ref 7–25)
CO2: 30 mmol/L (ref 20–32)
Calcium: 9.4 mg/dL (ref 8.6–10.4)
Chloride: 101 mmol/L (ref 98–110)
Creat: 0.85 mg/dL (ref 0.50–1.05)
GFR, Est African American: 87 mL/min/{1.73_m2} (ref >=60)
GFR, Est Non African American: 75 mL/min/{1.73_m2} (ref >=60)
Globulin: 3.2 g/dL (calc) (ref 1.9–3.7)
Glucose, Bld: 109 mg/dL — ABNORMAL HIGH (ref 65–99)
Potassium: 5.1 mmol/L (ref 3.5–5.3)
Sodium: 140 mmol/L (ref 135–146)
Total Bilirubin: 0.3 mg/dL (ref 0.2–1.2)
Total Protein: 7.2 g/dL (ref 6.1–8.1)

## 2019-10-25 NOTE — Telephone Encounter (Signed)
Completed PA for patient's voriconazole. Will update encounter when decision is determined.

## 2019-10-26 ENCOUNTER — Telehealth: Payer: Self-pay | Admitting: Internal Medicine

## 2019-10-26 NOTE — Telephone Encounter (Signed)
Discussed her Kimberly Shelton at interstitial lung disease conference February 2021.  She she now has interstitial lung disease with progression.  Also reviewed infectious disease note that recommends restarting voriconazole  Reviewed visit with Dr. Dierdre Forth rheumatology October 17, 2019: Treatment plan includes nonsteroidal anti-inflammatory drug and Plaquenil and low-dose prednisone     Plan -Might need a bronchoscopy with lavage to rule out any nontuberculous mycobacteria in the right upper lobe cavity -We will have to consider antifibrotic's of progressive ILD in the setting of rheumatoid arthritis  -Therefore give follow-up appointment 30-minute slot: Face-to-face -ideally Monday, 10/27/2018 1 in the afternoon  -If she cannot make it on Monday let me know

## 2019-10-26 NOTE — Progress Notes (Signed)
Interstitial Lung Disease Multidisciplinary Conference   Kimberly Shelton    MRN [id number]    DOB [birth year]  Primary Care Physician:Burchette, Alinda Sierras, MD  Referring Physician: October 16, 2019 by Dr. Chase Caller  Time of Conference: 7.30am- 8.30am Date of conference: October 16, 2019 Location of Conference: -  Virtual  Participating Pulmonary: Dr. Brand Males, MD -yes,  Dr Marshell Garfinkel, MD -yes Pathology: Dr Jaquita Folds, MD -yes, Dr Enid Cutter -no Radiology: Dr Salvatore Marvel MD -yes, Dr Vinnie Langton MD -no,  Dr Lorin Picket, MD -no, Dr Eddie Candle -no Others: Alethia Berthold  Brief History: Approximately 2015 this patient with rheumatoid arthritis on immunosuppressant biologic suffered from severe pneumococcal pneumonia that resulted in hospitalization and chronic residual scarring fibrotic lung disease but not ILD.  In the subsequent years developed pulmonary cavity and in 2020 was treated for aspergilloma.  Currently having continued pink frothy sputum.  Infectious diseases felt that the aspergilloma was adequately treated.  There is concern of developing interstitial lung disease based on radiology reports and also her dyspnea.  Serology: Known rheumatoid arthritis  MDD discussion of CT scan    - Date or time period of scan: Over the course of the last 5-6 years.  According to conference radiologist in 2015 patient had multilobar pneumonia and later in 2015 this was residual scarring.  By 2019 she developed chronic fibronodular cavitary disease with mycetoma.  This probably evidence of early interstitial lung disease with mild honeycombing.  By 2021 January there is definite UIP in the lung bases with definitely more prominent honeycombing of the lung base.  There is also progressive fibrocavitary lung disease with mycetoma.  This was in the right upper lobe.  Pathology discussion of biopsy no biopsy:  PFTs: Pending  Labs: Positive Aspergillus IgG*  MDD  Impression/Recs:  1.  She has rheumatoid arthritis UIP/ILD: For this antifibrotic's is recommended 2, for fibrocavitary disease depending on clinical profile a bronchoscopy with lavage to rule out nontuberculous mycobacteria and if lung function allows possible lobectomy to be considered depending on clinical situation  Time Spent in preparation and discussion:  > 30 min    SIGNATURE   Dr. Brand Males, M.D., F.C.C.P,  Pulmonary and Critical Care Medicine Staff Physician, [place] Director - Interstitial Lung Disease  Program  Pulmonary Study [place] at [place], [place], [address]  Pager: [phone], If no answer or between  15:00h - 7:00h: call [phone] Telephone: [phone]  4:50 PM 10/26/2019 ...................................................................................................................Marland Kitchen References: Diagnosis of Hypersensitivity Pneumonitis in Adults. An Official ATS/JRS/ALAT Clinical Practice Guideline. Ragu G et al, [place] Aug 1;202(3):e36-e69.       Diagnosis of Idiopathic Pulmonary Fibrosis. An Official ATS/ERS/JRS/ALAT Clinical Practice Guideline. Raghu G et al, [place]. 2018 Sep 1;198(5):e44-e68.   IPF Suspected   Histopath ology Pattern      UIP  Probable UIP  Indeterminate for  UIP  Alternative  diagnosis    UIP  IPF  IPF  IPF  Non-IPF dx   HRCT   Probabe UIP  IPF  IPF  IPF (Likely)**  Non-IPF dx  Pattern  Indeterminate for UIP  IPF  IPF (Likely)**  Indeterminate  for IPF**  Non-IPF dx    Alternative diagnosis  IPF (Likely)**/ non-IPF dx  Non-IPF dx  Non-IPF dx  Non-IPF dx     Idiopathic pulmonary fibrosis diagnosis  based upon HRCT and Biopsy paterns.  ** IPF is the likely diagnosis when any of following features are present:  . Moderate-to-severe traction bronchiectasis/bronchiolectasis (defined as  mild traction bronchiectasis/bronchiolectasis in four or more lobes including the lingual as a lobe, or moderate to severe traction bronchiectasis in two or more lobes) in a man over age 29 years or in a woman over age 52 years . Extensive (>30%) reticulation on HRCT and an age >70 years  . Increased neutrophils and/or absence of lymphocytosis in BAL fluid  . Multidisciplinary discussion reaches a confident diagnosis of IPF.   **Indeterminate for IPF  . Without an adequate biopsy is unlikely to be IPF  . With an adequate biopsy may be reclassified to a more specific diagnosis after multidisciplinary discussion and/or additional consultation.   dx = diagnosis; HRCT = high-resolution computed tomography; IPF = idiopathic pulmonary fibrosis; UIP = usual interstitial pneumonia.

## 2019-10-26 NOTE — Telephone Encounter (Signed)
PA is approved until 10/24/2020.

## 2019-10-29 ENCOUNTER — Ambulatory Visit: Payer: 59 | Admitting: Internal Medicine

## 2019-10-29 ENCOUNTER — Encounter: Payer: Self-pay | Admitting: Internal Medicine

## 2019-10-29 ENCOUNTER — Other Ambulatory Visit: Payer: Self-pay

## 2019-10-29 ENCOUNTER — Telehealth: Payer: Self-pay | Admitting: Pharmacy Technician

## 2019-10-29 VITALS — BP 122/58 | HR 83 | Ht 64.0 in | Wt 151.4 lb

## 2019-10-29 DIAGNOSIS — J8489 Other specified interstitial pulmonary diseases: Secondary | ICD-10-CM | POA: Diagnosis not present

## 2019-10-29 DIAGNOSIS — M359 Systemic involvement of connective tissue, unspecified: Secondary | ICD-10-CM

## 2019-10-29 DIAGNOSIS — R042 Hemoptysis: Secondary | ICD-10-CM

## 2019-10-29 DIAGNOSIS — B449 Aspergillosis, unspecified: Secondary | ICD-10-CM | POA: Diagnosis not present

## 2019-10-29 DIAGNOSIS — R011 Cardiac murmur, unspecified: Secondary | ICD-10-CM

## 2019-10-29 DIAGNOSIS — J479 Bronchiectasis, uncomplicated: Secondary | ICD-10-CM | POA: Diagnosis not present

## 2019-10-29 DIAGNOSIS — M069 Rheumatoid arthritis, unspecified: Secondary | ICD-10-CM

## 2019-10-29 NOTE — Telephone Encounter (Signed)
email must be sent to Harlan Stains regarding opening your schedule thanks

## 2019-10-29 NOTE — H&P (View-Only) (Signed)
     OV 03/04/2014 Chief Complaint  Patient presents with  . Follow-up    HFU-D/c from [facility]. Pt states she has chest soreness from coughing. Pt states with the O2 her breathing has improved. C/o dyspnea when working with PT and cough with yellow mucous.    Admitted 02/16/2014 through 02/24/2014 with right-sided pneumococcal pneumonia with cavitation in the setting of smoking and rheumatoid arthritis on immune modulators. At this point in time she is off the immunomodulators. Dr. Beekman her rheumatologist is aware of this. She is 25% back to her baseline but still very fatigued and short of breath. She coughs a lot with home physical therapy. She brings up green mucus and sputum with her cough needed overall cough is significantly improved since discharge. Appetite is slowly returning. She works as a mortgage broker at [employer] and does not want to go to work till she is fully off oxygen. She is requesting to stay off work for several weeks. On 2 L there is documentation that when she walks 2 minutes up saturation still stays at 95%.   Today in the office pon room air at rest t it is 93%. Walked 40 feet and desaturated to 89% but HR 150/min per tech: I think this is all deconditioning but not sure if she has cardiomyopathy from her illness   04/02/14 Follow up  Pt returns for follow up from recent sever critical illness from streptococcal PNA , Bacteremia, sepsis 02/2014.   She had a right-sided pneumococcal pneumonia with cavitation in the setting of smoking and rheumatoid arthritis on immune modulators Today PFT show FEV1 61% , ratio 87  , FVC 55 %  , NO sign BD response, Diffusing capacity  Decreased 57% Has not smoked.  Wants to return to work soon.  CXR shows persistent RUL/LLL cavitary lesions .  Says she is feeling so much better.  No fever, chest pain, orthopnea, edema or n/v/d.  Good appetite . Seen by cards for tachycardia felt secondary to recent PN   OV 05/01/2014   Chief Complaint  Patient presents with  . Follow-up    Pt states her breathing has improved since last OV. Pt c/o prod cough with yellow and clear mucous and lower mid lumbar pain, pt thinks d/t RA. Pt doing duoneb only once a day.  Pt denies SOB.     Admitted 02/16/2014 through 02/24/2014 with right-sided pneumococcal pneumonia with cavitation in the setting of smoking and rheumatoid arthritis on immune modulators. Current followup is for the same   S: Presents with husband. Feels great overall. No fatigute. No dyspnea. Able to do all ADLs. Wants to return to work now and is asking for release to work. In terms of RA, she feels early morning stiffness of hand joint is returning. SHe is seeing Dr Beekman 05/01/14 and will discuss with him about return to immunomodulators. There are no other issues. CXR done after she left and at time of this note:    Dg Chest 2 View  04/30/2014   CLINICAL DATA:  Followup cavitary pneumonia  EXAM: CHEST  2 VIEW  COMPARISON:  04/02/2014  FINDINGS: Normal heart size, mediastinal contours, and pulmonary vascularity.  Patchy infiltrates are identified in RIGHT upper lobe and LEFT lower lobe consistent with pneumonia.  Cavitary foci identified previously in the in both the RIGHT upper and LEFT lower lobes are still seen, the less evident in the RIGHT upper lobe.  Scattered interstitial prominence again identified.  No   gross pleural effusion or pneumothorax.  Bones unremarkable.  IMPRESSION: Persistent infiltrates are identified in the RIGHT upper lobe and LEFT lower lobe with persistent visualization of cavitary foci.   Electronically Signed   By: Mark  Boles M.D.   On: 04/30/2014 19:09      OV 08/09/2014  Chief Complaint  Patient presents with  . Follow-up    Pt here after CT scan. Pt denies change in breathing since last OV. Pt c/o prod cough with clear and green mucus.     Presents for Pneumococcal pneumonia followup that developed in setting of RA and Simponi   She is being maintained on ARAVA since her pneumococcal pneumonia earlier in 2013.  Now she is free of symptoms of respiratory tree. CT 08/08/14 shows further improvement but has architectural distortion. HEr RA is now significantly  active - per her and my talking 08/09/2014 with Dr Beekman. Patient is very apprehensive of starting ORENCIA recommended by Dr Beekman due to increased aecopd risk (She does not have copd), recurrent infection esp in setting of focal archiectrual distortion and recent pneumonia. She is very scared of dealing with RA disability v infection risk   Social and past and family: Mom had RA. She is upset abotu her disease  CT 08/08/14  IMPRESSION: 1. Emphysema with scattered scarring, volume loss, scattered bulla (some of which are still inflamed and with a somewhat cavitary appearance), and bilateral airway thickening. Much of this represents residua from prior multi lobar cavitary pneumonia, and is improved compared to the prior July CT scan. Given the degree of architectural distortion, scattered nodularity and opacities, and and irregular scarring, CT is not able to clear the lung of the possibility of malignancy, and surveillance imaging may be warranted.   Electronically Signed  By: Walt Liebkemann M.D.  On: 08/08/2014 15:32  OV 11/06/2014  Chief Complaint  Patient presents with  . Follow-up    Coughing up whitish colored phlegm, SOB with walking, not always, sometimes.      Presents for Pneumococcal pneumonia followup that developed in setting of RA and Simponi June 2015, with resultant architectural distortion in her lung anatomy  Last seen December 2015. Since then overall she's doing well. At baseline she only has mild cough with occasional white sputum. She also has exertional dyspnea when she climbs up a hill but clearly in cold air he did this is relieved by rest. She is currently on 2 immunomodulators for her rheumatoid arthritis Orencia and  Arava and this is helping her joints. She's not had any respiratory complications so far. I notice that she is not on Bactrim prophylaxis but she is concerned about the side effects. And she wants to talk to her rheumatologist Dr. Beekman about this  Past, Family, Social reviewed: no change since last visit   OV 02/10/2015  Chief Complaint  Patient presents with  . Follow-up    Pt stated her breathing feels it has improved since last OV. Pt here after PFT. Pt stated she has a little more trouble breathing when it is hot out. Pt c/o mild prod cough with clear and small light green mucus. Pt denies CP/tightness.    Follow-up chronic destructive pulmonary to changes following pneumococcal pneumonia in 2015 in the setting of previous heavy smoking and immunomodulatory treatment for rheumatoid arthritis   Since seeing me last in March 2016 she feels that the albuterol inhaler that I tried for dyspnea and exertion and wheezing has helped. Dyspnea still persists. It is unchanged.   It is mild. However it is improved with albuterol which she takes prior to exertion. There is no associated cough or. Wheezing is improved. No hemoptysis no fever no chills .Outside chart from Dr. Beekman her rheumatologist dated 02/04/2015 reviewed. She reported to him that she is compliant on Ornecia and arava with good relief and joint pain. Her dyspnea is mild chronic and stable. His advice for her to continue the same. He suggested that I look into her chronic residual dyspnea at this visit   Last pulmonary imaging with CT scan of the chest December 2015:residual changes + along with emphysema; report reviewed. Image not visualized  Pulmonary function test 02/10/2015 today personally reviewed image: FEV1 prebronchodilator 1.7 L/64%, ratio 69.  FEV1 postbronchodilator is 1.9 L/72% and is a 12% positive response with broncho-dilator., FVC 2.4 L/72% and ratio of 79. Total lung capacity is 3.76 L/76%. DLCO is 18.5/80%. PFTs are  consistent with restriction and a normal DLCO but in the prebronchodilator she has mild obstruction   OV 08/11/2015  Chief Complaint  Patient presents with  . Follow-up    Pt states her breathing has improved since last OV. Pt states she has only been using the spiriva as needed. Pt c/o prod cough with clear mucus and DOE with heavy exertion. pt denies CP/tightness.     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 She continues to do well. She is now on leflunomide and Orencia for her rheumatoid arthritis. With this rheumatoid arthritis symptoms have improved. She is follows up with Dr. Beekman in his new rheumatology practice [facility]. She feels that her wheezing has significantly improved. Shortness of breath significantly improved. She is not taking Spiriva anymore. She reported some hoarseness with it. She wants to continue to watch herself without Spiriva. She is up-to-date with her flu shot.   OV 02/09/2016  Chief Complaint  Patient presents with  . Follow-up    Pt reports that her breathing is doing well. Pt c/o occasional dry cough that is worse when using a vape for recreational use. Pt also c/o occasional wheeze with hot, humid weather and exertion. Pt does use albuterol HFA and her symptoms improve. Pt denies SOB/CP/tightness.        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 Doing well but has started smoking again though is nicotine-free vape smoking now. She did not want spiriva due to dpi and throat irritation. But she is using albuterol atleast daily. She is having exertional wheeze but otherwise okayt.  Orencia continues with Dr Beekman. She tells me she never had shingles vaccine. Otherwise well. Working at [employer] . Does not want cxr 02/09/2016 for fu. Wants to defer till next visit   OV 12/08/2017  Chief Complaint  Patient presents with  . Follow-up    Last seen 02/09/16.  Pt states she has been doing well since last visit and denies any complaints.      Follow-up chronic destructive pulmonary to changes following pneumococcal pneumonia in 2015 in the setting of previous heavy smoking and immunomodulatory treatment for rheumatoid arthritis   Personally not seen Teresa Stanard in almost 2 years.  She went through a separation and divorce and this time.    Smoking continues to be in remission.  At last visit she was smoking but now is in remission.  She continues to work at the bank.  For her rheumatoid arthritis she has been switched out of orcencia to xeljanz and this is working well for her.  She only has some mild pain.  In terms of her respiratory symptoms she hardly has any symptoms.  Definitely no cough.  She has mild dyspnea on exertion when working out in the yard but this is stable.  Review of the chart shows that her last echocardiogram was in 2015.  Her last chest x-ray or imaging was in 2016 and so was a pulmonary function test.  She wants to make sure her lungs are doing okay at this point.   OV 01/27/2018  Chief Complaint  Patient presents with  . Follow-up    Breathing is improved since last OV. Still has some coughing in the mornings. Denies chest tightness, wheezing.   Teresa Merriott presents for follow-up after testing.  In the interim she continues to be stable.  She only has mild dyspnea on exertion when she does gardening.  For this she uses albuterol as needed she does not want to upscale on her inhalers.  Testing showed atrial septal aneurysm and coronary artery calcification.  Cardiology has reassured her on both accounts.  In terms of the lung function that  seems to be a decline in FEV1 compared to 2016 but very similar to 2015.  DLCO is stable all along.  She feels stable.  CT scan of the chest shows improved bronchiectasis compared to few years ago.  The only issue is that in the right upper lobe that seems to be a cavity with a potential aspergilloma although this is totally uncertain at this stage.  She does not have any hemoptysis or any symptoms.  She is immunocompromised on rheumatoid arthritis medication Orencia at this point.  Apparently the Xeljanz was stopped.   Results for Carew, Nela J (MRN [id number]) as of 01/27/2018 10:48  Ref. Range 04/02/2014 11:42 02/10/2015 13:49 01/27/2018 09:57  FEV1-Post Latest Units: L 1.68 1.89 1.63  FEV1-%Pred-Post Latest Units: % 64 73 64  FEV1-%Change-Post Latest Units: % 3 12 3   Results for Orosz, Jadamarie J (MRN [id number]) as of 01/27/2018 10:48  Ref. Range 04/02/2014 11:42 02/10/2015 13:49 01/27/2018 09:57  DLCO unc Latest Units: ml/min/mmHg 13.15 18.50 18.02  DLCO unc % pred Latest Units: % 57 80 78    IMPRESSION: CT chest 1. Widespread areas of bronchiectasis and scarring, generally similar to the prior examination. Two of the thick-walled cavitary areas in the right upper lobe seen on the prior study have resolved, however, there is a new area of scarring in the right upper lobe near the apex which contains some internal soft tissue which is favored to represent some sloughed necrotic lung tissue, although an aspergilloma is not entirely excluded. Close attention on future follow-up imaging is recommended. 2. Possible honeycombing in the extreme lung bases which could indicate early interstitial lung disease. Future follow-up examination should be performed is high-resolution chest CTs to better evaluate these findings. 3. Cholelithiasis. 4. Aortic atherosclerosis, in addition to 2 vessel coronary artery disease. Please note that although the presence of coronary artery calcium documents  the presence of coronary artery disease, the severity of this disease and any potential stenosis cannot be assessed on this non-gated CT examination. Assessment for potential risk factor modification, dietary therapy or pharmacologic therapy may be warranted, if   clinically indicated.  Aortic Atherosclerosis (ICD10-I70.0).   Electronically Signed   By: Daniel  Entrikin M.D.   On: 12/21/2017 13:00   ECHO 12/12/17 Study Conclusions  - Left ventricle: The cavity size was normal. Wall thickness was   normal. Systolic function was vigorous. The estimated ejection   fraction was in the range of 65% to 70%. Wall motion was normal;   there were no regional wall motion abnormalities. Doppler   parameters are consistent with abnormal left ventricular   relaxation (grade 1 diastolic dysfunction). - Atrial septum: There was an atrial septal aneurysm.  Impressions:  - Vigorous LV systolic function, mild diastolic dysfunction.  OV 08/11/2018  Subjective:  Patient ID: Kamiryn J Packman, female , DOB: 10/16/1960 , age 57 y.o. , MRN: [id number] , ADDRESS: [address] [place] [address]   08/11/2018 -   Chief Complaint  Patient presents with  . Follow-up    f/u bronchiectasis, PFT today, pt doing well , only cough in tha am with little mucus     HPI Shifa J Pauling 59 y.o. -presents for follow-up of bronchiectasis suffered in 2015 following really severe pneumococcal pneumonia in the setting of immunosuppression due to rheumatoid arthritis.  This routine follow-up.  When I saw her in the spring 2019 we did a CT scan of the chest and there was suggestion of possible aspergillus cavity.  Did Aspergillus antibody and she was positive for IgG.  Therefore referred her to infectious diseases.  I reviewed the note.  Given overall stability expectant approach has been adopted.  At this point in time she feels stable using albuterol as needed.  Vaccine records indicate she could benefit  from both shingles vaccine and Prevnar.  She seems to be under the assumption that shingles vaccine is only after age 65.  I reviewed the literature and it is actually indicated for people over 50.  However she should only get the new Shingrix vaccine which is inactivated.  Prevnar is also indicated for her given her previous pneumococcal pneumonia and her immunosuppressed status.  Coronary artery calcification on CT scan: This was seen in April 2019.  Since then she did see cardiology.  They are aware of this and the place her on follow-up.  She does not have any chest pain currently.    OV 09/13/2019  Subjective:  Patient ID: Jakaiya J Hauschild, female , DOB: 05/03/1961 , age 58 y.o. , MRN: [id number] , ADDRESS: [address] [place] [address]   09/13/2019 -   Chief Complaint  Patient presents with  . Follow-up    Pt states she has been spitting up bloody mucus every morning and states her throat is also raw feeling. Pt was diagnosed with covid 11/19 and did okay with that other than losing her sense of taste and smell.   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 Coomer  HPI Damara J Gero 58 y.o. -returns for follow-up.  Last seen in December 2019.  After that in the summer 2020 CT scan of the chest showed progression.  I referred her to Dr. Robert Comer who placed her on voriconazole.  She is currently finished her treatment with voriconazole but she tells me for the last 6 months she continues to have early morning mild hemoptysis versus pink sputum.    She says that despite the voriconazole this persist.  In early November 2020/mid November 2020 she picked up COVID-19 while singing in a church along with her sister who also had COVID-19.  One  of the person died in that cluster.  That happens to be my patient.  She tells me since the COVID-19 for which she did not get hospitalized she has had slight increase in cough and slight increase in hemoptysis and ongoing fatigue.  For her rheumatoid arthritis she is only on prednisone she is unable to take any immunomodulators because of her pulmonary issues.  The disease has gotten progressive with the onset of rheumatoid nodules in her bilateral elbows.  She has gained weight in the pandemic because of being sedentary.   Review of the chart indicates last echo was in 2019.  She has a murmur.  Her last CT scan of the chest was in August 2020.   She wants to know if it is okay to go back on immunosuppressive's.- d/w Dr Beekman -  DMARD prob ok but not biologics   OV 10/11/2019  Subjective:  Patient ID: Rylee J Nicole, female , DOB: 02/07/1961 , age 58 y.o. , MRN: [id number] , ADDRESS: [address] [place] [address]   10/11/2019 -   Chief Complaint  Patient presents with  . 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 Coomer  #small hiatl hernia  #coronary artery calcification and baseline heart murmur - dr McAlhany   HPI Shamya J Shankles 58 y.o. -presents to discuss the test results in the setting of above medical issues.  She again tells me that she started having hemoptysis 6 months ago or so.  She was then given voriconazole.  She says with that the pink slight hemoptysis did not resolve.  Then she finished an adequate course of voriconazole but the light streaky pink hemoptysis is continued.  Then several days ago she had like one dark clot.  She is now given that sputum  for AFB cultures are pending smear is negative.  Otherwise new problem is she is constipated for 3 days.  She is also taking her prednisone at 5 mg/day because it is causing increased weight gain.  Her arthralgia is worse.  She plans to see rheumatology in a week or 2.  New issue is constipation for the last 3 days.  However in review of the chart she has had constipation in the past but she is expressing it to me for the first time.  She also wants treatment for this.  In terms of CT scan of the chest: Radiologist calling for presence of UIP/ILD with progression.  In addition she has a fibrocavitary fibronodular changes with aspergilloma.  She also has coronary artery calcification  Echocardiogram shows left ventricular hypertrophy with normal ejection fraction but there is a description of left to right interatrial shunt.  I think this is a new finding.  IMPRESSION Jan 2021 1. Spectrum of findings at the lung bases worrisome for progressive fibrotic interstitial lung disease with mild-to-moderate honeycombing, clearly progressive compared to 2015 and 2019 chest CT studies, with more mild progression since 05/02/2019 chest CT. Findings are consistent with UIP per consensus guidelines: Diagnosis of Idiopathic Pulmonary Fibrosis:   An Official ATS/ERS/JRS/ALAT Clinical Practice Guideline. Am J Respir Crit Care Med Vol 198, Iss 5, [id number], May 07 2017. 2. Extensive chronic fibrocavitary and fibronodular changes in the right greater than left lung apices with evidence of mycetomas in the large irregular thick walled cavitary region in the apical right lung, which has mildly increased in size. 3. One vessel coronary atherosclerosis. 4. Stable mild right paratracheal lymphadenopathy, most compatible with benign reactive etiology. 5. Small hiatal hernia. 6. Cholelithiasis.  Aortic Atherosclerosis (ICD10-I70.0) and Emphysema (ICD10-J43.9).   Electronically Signed   By: Jason A Poff M.D.    On: 09/26/2019 16:43  ECHO Jan 2021  -- LVH ef 65% ROS - per HPI   OV 10/29/2019  Subjective:  Patient ID: Sherrine J Hyden, female , DOB: 05/19/1961 , age 58 y.o. , MRN: [id number] , ADDRESS: [address] [place] [address]   10/29/2019 -   Chief Complaint  Patient presents with  . Follow-up    Pt states she has been doing okay since last visit. States she is still occ coughing up blood. Pt states that she is sore/achey in the mornings prior to getting up and will take aleve to help with that.   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 Comer  #ILD/UIP  - progressive UIP pattern - on HRCt April 2019 -> 2021 Jan  #small hiatl hernia  #coronary artery calcification and baseline heart murmur - dr McAlhany  #RA  - Dr Beekman HPI Marimar J Piltz 58 y.o. -returns for follow-up.  Last seen in early in February 2021.  Several concerns at that time.  In the interim I presented her case at the multidisciplinary case conference.  The determination was that she had early ILD changes even in April 2019 and is now progressed to a definite UIP pattern.  This is a new finding for us.  Concern of the conference is that she has rheumatoid arthritis ILD/UIP pattern.  And this is progressive.  There for this visit is to discuss specifically the UIP issues but also round of the other issues.  Again with this diagnosis.  Also discussed the potential for participation in a research registry.  She is interested.  We also discussed antifibrotic therapy nintedanib.  Discussed contraindications which she does not seem to have.  She does not have colitis.  Infectious constipation.  She does not have any  bleeding diathesis.  Not on blood thinners.  She is on voriconazole and Plaquenil and now with nintedanib she will require intensive liver function test monitoring.  She is open for this.  She understands this drug is to help prevent fibrosis getting worse but will not manage her symptoms.  In terms of her ongoing hemoptysis with fibrocavitary bronchiectasis and aspergilloma: She revisited with Dr. Coomer.  I reviewed the note.  Patient has been restarted on voriconazole 3 days ago on October 26, 2019.  After this the hemoptysis might be slightly better.  Multidisciplinary case conference consideration is for bronchoscopy with lavage but again this has been disrupted because of the COVID-19 pandemic.  She thinks hemoptysis is slightly better but is still present.   In terms of rheumatoid arthritis: She visited with Dr. Beekman.  I reviewed the note.    She has been asked to continue with the prednisone 5 mg/day.  Plaquenil was been added because of her pain.  She is extremely upset about her rheumatoid arthritis nodules in the bilateral elbow region.  Also with ongoing pain.  In terms of coronary artery calcification based on heart murmur: I referred her to Dr. Mcalhany.  Was rereferral.  I do not see an appointment made.       ROS - per HPI     has a past medical history of Abnormal Pap smear (09/2006), CHF (congestive heart failure) (HCC), Foot fracture, left (10/16), Pneumonia, and RA (rheumatoid arthritis) (HCC) (07/2009).   reports that she quit smoking about 5 years ago. Her smoking use included cigarettes and e-cigarettes. She has a 30.00 pack-year smoking history. She has never used smokeless tobacco.  Past Surgical History:  Procedure Laterality Date  . CERVICAL BIOPSY  W/ LOOP ELECTRODE EXCISION  2008   CIN 3  . CERVICAL CONIZATION W/BX N/A 12/15/2015   Procedure: CONIZATION CERVIX WITH BIOPSY ;  Surgeon: Mary S Miller, MD;  Location: [facility];  Service: Gynecology;  Laterality: N/A;   POSSIBLE COLD KNIFE CONE.  Please have the microscope in the room  . COLONOSCOPY    . LEEP N/A 12/15/2015   Procedure: LOOP ELECTROSURGICAL EXCISION PROCEDURE (LEEP) with colpo;  Surgeon: Mary S Miller, MD;  Location: [facility];  Service: Gynecology;  Laterality: N/A;  . PILONIDAL CYST EXCISION  1999   I&D  . WISDOM TOOTH EXTRACTION      Allergies  Allergen Reactions  . Bee Venom Anaphylaxis  . Codeine Other (See Comments)    "seeing spots"  . Penicillins Rash    Has patient had a PCN reaction causing immediate rash, facial/tongue/throat swelling, SOB or lightheadedness with hypotension: No Has patient had a PCN reaction causing severe rash involving mucus membranes or skin necrosis: No Has patient had a PCN reaction that required hospitalization No Has patient had a PCN reaction occurring within the last 10 years: Yes If all of the above answers are "NO", then may proceed with Cephalosporin use.     Immunization History  Administered Date(s) Administered  . Influenza Inj Mdck Quad Pf 05/25/2019  . Influenza,inj,Quad PF,6+ Mos 06/25/2015, 05/31/2017, 05/29/2018  . Influenza-Unspecified 06/06/2014  . Pneumococcal Conjugate-13 08/11/2018  . Pneumococcal Polysaccharide-23 02/18/2014  . Tdap 09/01/2016    Family History  Problem Relation Age of Onset  . Diabetes Mother   . Rheum arthritis Mother   . Diabetes Father   . COPD Father   . Bronchitis Father   . Cancer Brother        Lung  . Breast cancer Maternal Aunt 68  . CAD Neg Hx      Current Outpatient Medications:  .  benzonatate (TESSALON) 200 MG capsule, Take 1 capsule (200 mg total) by mouth 3 (three) times daily as needed for cough., Disp: 30 capsule, Rfl: 1 .  escitalopram (LEXAPRO) 10 MG tablet, TAKE 1 TABLET BY MOUTH EVERY DAY, Disp: 90 tablet, Rfl: 2 .  hydroxychloroquine (PLAQUENIL) 200 MG tablet, Take 200 mg by mouth 2 (two) times daily., Disp: , Rfl:  .  naproxen sodium (ALEVE) 220 MG tablet, Take 220 mg by  mouth daily as needed., Disp: , Rfl:  .  predniSONE (DELTASONE) 5 MG tablet, Take 5 mg by mouth 2 (two) times daily., Disp: , Rfl:  .  PROAIR HFA 108 (90 Base) MCG/ACT inhaler, IHALE 2 PUFFS INTO THE LUNGS EVERY 6 HOURS   AS NEEDED FOR WHEEZING OR SHORTNESS OF BREATH, Disp: 8.5 g, Rfl: 3 .  voriconazole (VFEND) 200 MG tablet, Take 1 tablet (200 mg total) by mouth 2 (two) times daily., Disp: 60 tablet, Rfl: 2      Objective:   Vitals:   10/29/19 1508  BP: (!) 122/58  Pulse: 83  SpO2: 98%  Weight: 151 lb 6.4 oz (68.7 kg)  Height: 5' 4" (1.626 m)    Estimated body mass index is 25.99 kg/m as calculated from the following:   Height as of this encounter: 5' 4" (1.626 m).   Weight as of this encounter: 151 lb 6.4 oz (68.7 kg).  @WEIGHTCHANGE@  Filed Weights   10/29/19 1508  Weight: 151 lb 6.4 oz (68.7 kg)     Physical Exam Pleasant female has elbow nodules.  Tearful while giving diagnosis of interstitial lung disease.  Alert and oriented x3.          Assessment:       ICD-10-CM   1. 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 Hemoptysis  R04.2   4. Interstitial lung disease due to connective tissue disease (HCC)  J84.89    M35.9   5. Rheumatoid arthritis involving multiple joints (HCC)  M06.9   6. Cardiac murmur  R01.1        Plan:     Patient Instructions  Hemoptysis  - mild and ongoing and due to below lung issues but maybe better after voricanazole restart on 10/26/2019  Plan  - need bronchoscpy with lavage but delays due to covid-19;  will hold off for now  - any massive or major coughing up blood - go to ER - will revisit bronchoscopy at next visit  Pulmonary aspergilloma (HCC) Bronchiectasis without complication (HCC)  -noted back on voricanazole  Plan  - per Dr Comer - continue voricanazole   Interstitial lung disease due to connective tissue disease (HCC) - UIP pattern, progresive  - you need  an anti-fibrotic (LFT noirmal 10/24/19)  Plan -start ofev 150mg twice daily (might help your constipation because this drug causes diarrhea) - schedule visit with Amber Yoppp to gov over drug interactioins and get educated on ofev  - this drug requires intensive monitoring - take consent form for ILD-PRO study  - Lindsey Davis from [facility] will reach out to you to schedule a visit if you are itnerested -Get spirometry and DLCO in April 2021 -Return to see Dr. Gerrad Welker in April 2021 in 30-minute slot  Rheumatoid arthritis involving multiple joints (HCC)  -This appears active given the onset of pulmonary nodules and new elbows and also interstitial lung disease in the lung and active joint pain  - I reviewed dR Beekman recent notes  Plan -plaquenil oer Dr Beekman -Keep your prednisone at 5 mg/day -Avoid biologic treatment for your rheumatoid arthritis but probably okay to start leflunomide  Interatrial cardiac shunt-this appears new on the echocardiogram January 2021 Cardiac murmur -old Coronary artery calcification-old  Plan -Refer Dr. Christopher Mcalhany her cardiologist -make the appointment 10/29/2019   Follow-up - refer Dr McAlhany cards  -r efer Amber Yopp pharmacist for visit next 2-3 weeks -April 2021 do spirometry and DLCO -30-minute slot in April 2021 with Dr Britney Newstrom - ILD clinic or any day   (Level 04: Estb 30-39 min  visit type: on-site physical face to visit visit spent in total care time and counseling or/and coordination of care by this undersigned MD -   Dr Fenris Cauble. This includes one or more of the following on this same day 10/29/2019: pre-charting, chart review, note writing, documentation discussion of test results, diagnostic or treatment recommendations, prognosis, risks and benefits of management options, instructions, education, compliance or risk-factor reduction. It excludes time spent by the CMA or office staff in the care of the patient . Actual  time is 31 min)   SIGNATURE    Dr. Gianny Sabino, M.D., F.C.C.P,  Pulmonary and Critical Care Medicine Staff Physician, [facility] Director - Interstitial Lung Disease  Program  [facility] [place], [place], [address]  Pager: [phone], If no answer or between  15:00h - 7:00h: call [phone] Telephone: [phone]  3:43 PM 10/29/2019  

## 2019-10-29 NOTE — Progress Notes (Signed)
OV 03/04/2014 Chief Complaint  Patient presents with  . Follow-up    HFU-D/c from [facility]. Pt states she has chest soreness from coughing. Pt states with the O2 her breathing has improved. C/o dyspnea when working with PT and cough with yellow mucous.    Admitted 02/16/2014 through 02/24/2014 with right-sided pneumococcal pneumonia with cavitation in the setting of smoking and rheumatoid arthritis on immune modulators. At this point in time she is off the immunomodulators. Dr. Dierdre Forth her rheumatologist is aware of this. She is 25% back to her baseline but still very fatigued and short of breath. She coughs a lot with home physical therapy. She brings up green mucus and sputum with her cough needed overall cough is significantly improved since discharge. Appetite is slowly returning. 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 She is requesting to stay off work for several weeks. On 2 L there is documentation that when she walks 2 minutes up saturation still stays at 95%.   Today in the office pon room air at rest t it is 93%. Walked 40 feet and desaturated to 89% but HR 150/min per tech: I think this is all deconditioning but not sure if she has cardiomyopathy from her illness   04/02/14 Follow up  Pt returns for follow up from recent sever critical illness from streptococcal PNA , Bacteremia, sepsis 02/2014.   She had a right-sided pneumococcal pneumonia with cavitation in the setting of smoking and rheumatoid arthritis on immune modulators Today PFT show FEV1 61% , ratio 87  , FVC 55 %  , NO sign BD response, Diffusing capacity  Decreased 57% Has not smoked.  Wants to return to work soon.  CXR shows persistent RUL/LLL cavitary lesions .  Says she is feeling so much better.  No fever, chest pain, orthopnea, edema or n/v/d.  Good appetite . Seen by cards for tachycardia felt secondary to recent PN   OV 05/01/2014   Chief Complaint  Patient presents with  . Follow-up    Pt states her breathing has improved since last OV. Pt c/o prod cough with yellow and clear mucous and lower mid lumbar pain, pt thinks d/t RA. Pt doing duoneb only once a day.  Pt denies SOB.     Admitted 02/16/2014 through 02/24/2014 with right-sided pneumococcal pneumonia with cavitation in the setting of smoking and rheumatoid arthritis on immune modulators. Current followup is for the same   S: Presents with husband. Feels great overall. No fatigute. No dyspnea. Able to do all ADLs. Wants to return to work now and is asking for release to work. In terms of RA, she feels early morning stiffness of hand joint is returning. SHe is seeing Dr Dierdre Forth 05/01/14 and will discuss with him about return to immunomodulators. There are no other issues. CXR done after she left and at time of this note:    Dg Chest 2 View  04/30/2014   CLINICAL DATA:  Followup cavitary pneumonia  EXAM: CHEST  2 VIEW  COMPARISON:  04/02/2014  FINDINGS: Normal heart size, mediastinal contours, and pulmonary vascularity.  Patchy infiltrates are identified in RIGHT upper lobe and LEFT lower lobe consistent with pneumonia.  Cavitary foci identified previously in the in both the RIGHT upper and LEFT lower lobes are still seen, the less evident in the RIGHT upper lobe.  Scattered interstitial prominence again identified.  No  gross pleural effusion or pneumothorax.  Bones unremarkable.  IMPRESSION: Persistent infiltrates are identified in the RIGHT upper lobe and LEFT lower lobe with persistent visualization of cavitary foci.   Electronically Signed   By: Ulyses Southward M.D.   On: 04/30/2014 19:09      OV 08/09/2014  Chief Complaint  Patient presents with  . Follow-up    Pt here after CT scan. Pt denies change in breathing since last OV. Pt c/o prod cough with clear and green mucus.     Presents for Pneumococcal pneumonia followup that developed in setting of RA and Simponi   She is being maintained on ARAVA since her pneumococcal pneumonia earlier in 2013.  Now she is free of symptoms of respiratory tree. CT 08/08/14 shows further improvement but has architectural distortion. HEr RA is now significantly  active - per her and my talking 08/09/2014 with Dr Dierdre Forth. Patient is very apprehensive of starting ORENCIA recommended by Dr Dierdre Forth due to increased aecopd risk (She does not have copd), recurrent infection esp in setting of focal archiectrual distortion and recent pneumonia. She is very scared of dealing with RA disability v infection risk   Social and past and family: Mom had RA. She is upset abotu her disease  CT 08/08/14  IMPRESSION: 1. Emphysema with scattered scarring, volume loss, scattered bulla (some of which are still inflamed and with a somewhat cavitary appearance), and bilateral airway thickening. Much of this represents residua from prior multi lobar cavitary pneumonia, and is improved compared to the prior July CT scan. Given the degree of architectural distortion, scattered nodularity and opacities, and and irregular scarring, CT is not able to clear the lung of the possibility of malignancy, and surveillance imaging may be warranted.   Electronically Signed  By: [name]  On: 08/08/2014 15:32  OV 11/06/2014  Chief Complaint  Patient presents with  . Follow-up    Coughing up whitish colored phlegm, SOB with walking, not always, sometimes.      Presents for Pneumococcal pneumonia followup that developed in setting of RA and Simponi June 2015, with resultant architectural distortion in her lung anatomy  Last seen December 2015. Since then overall she's doing well. At baseline she only has mild cough with occasional white sputum. She also has exertional dyspnea when she climbs up a hill but clearly in cold air he did this is relieved by rest. She is currently on 2 immunomodulators for her rheumatoid arthritis Orencia and  Arava and this is helping her joints. She's not had any respiratory complications so far. I notice that she is not on Bactrim prophylaxis but she is concerned about the side effects. And she wants to talk to her rheumatologist Dr. Dierdre Forth about this  Past, Family, Social reviewed: no change since last visit   OV 02/10/2015  Chief Complaint  Patient presents with  . Follow-up    Pt stated her breathing feels it has improved since last OV. Pt here after PFT. Pt stated she has a little more trouble breathing when it is hot out. Pt c/o mild prod cough with clear and small light green mucus. Pt denies CP/tightness.    Follow-up chronic destructive pulmonary to changes following pneumococcal pneumonia in 2015 in the setting of previous heavy smoking and immunomodulatory treatment for rheumatoid arthritis   Since seeing me last in March 2016 she feels that the albuterol inhaler that I tried for dyspnea and exertion and wheezing has helped. Dyspnea still persists. It is unchanged.  It is mild. However it is improved with albuterol which she takes prior to exertion. There is no associated cough or. Wheezing is improved. No hemoptysis no fever no chills .Outside chart from Dr. Dierdre Forth her rheumatologist dated 02/04/2015 reviewed. She reported to him that she is compliant on Ornecia and arava with good relief and joint pain. Her dyspnea is mild chronic and stable. His advice for her to continue the same. He suggested that I look into her chronic residual dyspnea at this visit   Last pulmonary imaging with CT scan of the chest December 2015:residual changes + along with emphysema; report reviewed. Image not visualized  Pulmonary function test 02/10/2015 today personally reviewed image: FEV1 prebronchodilator 1.7 L/64%, ratio 69.  FEV1 postbronchodilator is 1.9 L/72% and is a 12% positive response with broncho-dilator., FVC 2.4 L/72% and ratio of 79. Total lung capacity is 3.76 L/76%. DLCO is 18.5/80%. PFTs are  consistent with restriction and a normal DLCO but in the prebronchodilator she has mild obstruction   OV 08/11/2015  Chief Complaint  Patient presents with  . Follow-up    Pt states her breathing has improved since last OV. Pt states she has only been using the spiriva as needed. Pt c/o prod cough with clear mucus and DOE with heavy exertion. pt denies CP/tightness.     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 She continues to do well. She is now on leflunomide and Orencia for her rheumatoid arthritis. With this rheumatoid arthritis symptoms have improved. She is follows up with Dr. Dierdre Forth in his new rheumatology practice [facility]. She feels that her wheezing has significantly improved. Shortness of breath significantly improved. She is not taking Spiriva anymore. She reported some hoarseness with it. She wants to continue to watch herself without Spiriva. She is up-to-date with her flu shot.   OV 02/09/2016  Chief Complaint  Patient presents with  . Follow-up    Pt reports that her breathing is doing well. Pt c/o occasional dry cough that is worse when using a vape for recreational use. Pt also c/o occasional wheeze with hot, humid weather and exertion. Pt does use albuterol HFA and her symptoms improve. Pt denies SOB/CP/tightness.        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 Doing well but has started smoking again though is nicotine-free vape smoking now. She did not want spiriva due to dpi and throat irritation. But she is using albuterol atleast daily. She is having exertional wheeze but otherwise okayt.  Dub Amis continues with Dr Dierdre Forth. She tells me she never had shingles vaccine. Otherwise well. Working at [employer] . Does not want cxr 02/09/2016 for fu. Wants to defer till next visit   OV 12/08/2017  Chief Complaint  Patient presents with  . Follow-up    Last seen 02/09/16.  Pt states she has been doing well since last visit and denies any complaints.      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  She went through a separation and divorce and this time.  Smoking continues to be in remission.  At last visit she was smoking but now is in remission.  She continues to work at the [employer].  For her rheumatoid arthritis she has been switched out of orcencia to [place] and this is working well for her.  She only has some mild pain.  In terms of her respiratory symptoms she hardly has any symptoms.  Definitely no cough.  She has mild dyspnea on exertion when working out in the yard but this is stable.  Review of the chart shows that her last echocardiogram was in 2015.  Her last chest x-ray or imaging was in 2016 and so was a pulmonary function test.  She wants to make sure her lungs are doing okay at this point.   OV 01/27/2018  Chief Complaint  Patient presents with  . Follow-up    Breathing is improved since last OV. Still has some coughing in the mornings. Denies chest tightness, wheezing.   Kimberly Shelton presents for follow-up after testing.  In the interim she continues to be stable.  She only has mild dyspnea on exertion when she does gardening.  For this she uses albuterol as needed she does not want to upscale on her inhalers.  Testing showed atrial septal aneurysm and coronary artery calcification.  Cardiology has reassured her on both accounts.  In terms of the lung function that  seems to be a decline in FEV1 compared to 2016 but very similar to 2015.  DLCO is stable all along.  She feels stable.  CT scan of the chest shows improved bronchiectasis compared to few years ago.  The only issue is that in the right upper lobe that seems to be a cavity with a potential aspergilloma although this is totally uncertain at this stage.  She does not have any hemoptysis or any symptoms.  She is immunocompromised on rheumatoid arthritis medication Orencia at this point.  Apparently the Harriette Ohara was stopped.   Results for Kimberly, Shelton (MRN [id number]) as of 01/27/2018 10:48  Ref. Range 04/02/2014 11:42 02/10/2015 13:49 01/27/2018 09:57  FEV1-Post Latest Units: L 1.68 1.89 1.63  FEV1-%Pred-Post Latest Units: % 64 73 64  FEV1-%Change-Post Latest Units: % 3 12 3    Results for Kimberly, Shelton (MRN Kimberly Shelton) as of 01/27/2018 10:48  Ref. Range 04/02/2014 11:42 02/10/2015 13:49 01/27/2018 09:57  DLCO unc Latest Units: ml/min/mmHg 13.15 18.50 18.02  DLCO unc % pred Latest Units: % 57 80 78    IMPRESSION: CT chest 1. Widespread areas of bronchiectasis and scarring, generally similar to the prior examination. Two of the thick-walled cavitary areas in the right upper lobe seen on the prior study have resolved, however, there is a new area of scarring in the right upper lobe near the apex which contains some internal soft tissue which is favored to represent some sloughed necrotic lung tissue, although an aspergilloma is not entirely excluded. Close attention on future follow-up imaging is recommended. 2. Possible honeycombing in the extreme lung bases which could indicate early interstitial lung disease. Future follow-up examination should be performed is high-resolution chest CTs to better evaluate these findings. 3. Cholelithiasis. 4. Aortic atherosclerosis, in addition to 2 vessel coronary artery disease. Please note that although the presence of coronary artery calcium documents  the presence of coronary artery disease, the severity of this disease and any potential stenosis cannot be assessed on this non-gated CT examination. Assessment for potential risk factor modification, dietary therapy or pharmacologic therapy may be warranted, if  clinically indicated.  Aortic Atherosclerosis (ICD10-I70.0).   Electronically Signed   By: Trudie Reed M.D.   On: 12/21/2017 13:00   ECHO 12/12/17 Study Conclusions  - Left ventricle: The cavity size was normal. Wall thickness was   normal. Systolic function was vigorous. The estimated ejection   fraction was in the range of 65% to 70%. Wall motion was normal;   there were no regional wall motion abnormalities. Doppler   parameters are consistent with abnormal left ventricular   relaxation (grade 1 diastolic dysfunction). - Atrial septum: There was an atrial septal aneurysm.  Impressions:  - Vigorous LV systolic function, mild diastolic dysfunction.  OV 08/11/2018  Subjective:  Patient ID: Kimberly Shelton, female , DOB: 27-Feb-1961 , age 59 y.o. , MRN: [id number] , ADDRESS: [address] [place] [address]   08/11/2018 -   Chief Complaint  Patient presents with  . Follow-up    f/u bronchiectasis, PFT today, pt doing well , only cough in tha am with little mucus     HPI Kimberly Shelton 59 y.o. -presents for follow-up of bronchiectasis suffered in 2015 following really severe pneumococcal pneumonia in the setting of immunosuppression due to rheumatoid arthritis.  This routine follow-up.  When I saw her in the spring 2019 we did a CT scan of the chest and there was suggestion of possible aspergillus cavity.  Did Aspergillus antibody and she was positive for IgG.  Therefore referred her to infectious diseases.  I reviewed the note.  Given overall stability expectant approach has been adopted.  At this point in time she feels stable using albuterol as needed.  Vaccine records indicate she could benefit  from both shingles vaccine and Prevnar.  She seems to be under the assumption that shingles vaccine is only after age 10.  I reviewed the literature and it is actually indicated for people over 50.  However she should only get the new Shingrix vaccine which is inactivated.  Prevnar is also indicated for her given her previous pneumococcal pneumonia and her immunosuppressed status.  Coronary artery calcification on CT scan: This was seen in April 2019.  Since then she did see cardiology.  They are aware of this and the place her on follow-up.  She does not have any chest pain currently.    OV 09/13/2019  Subjective:  Patient ID: Kimberly Shelton, female , DOB: May 19, 1961 , age 28 y.o. , MRN: [id number] , ADDRESS: [address] [place] [address]   09/13/2019 -   Chief Complaint  Patient presents with  . Follow-up    Pt states she has been spitting up bloody mucus every morning and states her throat is also raw feeling. Pt was diagnosed with covid 11/19 and did okay with that other than losing her sense of taste and smell.   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 Joaquim Lai  HPI Kimberly Shelton 59 y.o. -returns for follow-up.  Last seen in December 2019.  After that in the summer 2020 CT scan of the chest showed progression.  I referred her to Dr. Staci Righter who placed her on voriconazole.  She is currently finished her treatment with voriconazole but she tells me for the last 6 months she continues to have early morning mild hemoptysis versus pink sputum.  She says that despite the voriconazole this persist.  In early November 2020/mid November 2020 she picked up COVID-19 while singing in a church along with her sister who also had COVID-19.  One  of the person died in that cluster.  That happens to be my patient.  She tells me since the COVID-19 for which she did not get hospitalized she has had slight increase in cough and slight increase in hemoptysis and ongoing fatigue.  For her rheumatoid arthritis she is only on prednisone she is unable to take any immunomodulators because of her pulmonary issues.  The disease has gotten progressive with the onset of rheumatoid nodules in her bilateral elbows.  She has gained weight in the pandemic because of being sedentary.   Review of the chart indicates last echo was in 2019.  She has a murmur.  Her last CT scan of the chest was in August 2020.   She wants to know if it is okay to go back on immunosuppressive's.- d/w Dr Dierdre Forth -  DMARD prob ok but not biologics   OV 10/11/2019  Subjective:  Patient ID: Kimberly Shelton, female , DOB: 10-07-1960 , age 46 y.o. , MRN: [id number] , ADDRESS: [address] [place] [address]   10/11/2019 -   Chief Complaint  Patient presents with  . 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 Joaquim Lai  #small hiatl hernia  #coronary artery calcification and baseline heart murmur - dr Clifton James   HPI Kimberly Shelton 59 y.o. -presents to discuss the test results in the setting of above medical issues.  She again tells me that she started having hemoptysis 6 months ago or so.  She was then given voriconazole.  She says with that the pink slight hemoptysis did not resolve.  Then she finished an adequate course of voriconazole but the light streaky pink hemoptysis is continued.  Then several days ago she had like one dark clot.  She is now given that sputum  for AFB cultures are pending smear is negative.  Otherwise new problem is she is constipated for 3 days.  She is also taking her prednisone at 5 mg/day because it is causing increased weight gain.  Her arthralgia is worse.  She plans to see rheumatology in a week or 2.  New issue is constipation for the last 3 days.  However in review of the chart she has had constipation in the past but she is expressing it to me for the first time.  She also wants treatment for this.  In terms of CT scan of the chest: Radiologist calling for presence of UIP/ILD with progression.  In addition she has a fibrocavitary fibronodular changes with aspergilloma.  She also has coronary artery calcification  Echocardiogram shows left ventricular hypertrophy with normal ejection fraction but there is a description of left to right interatrial shunt.  I think this is a new finding.  IMPRESSION Jan 2021 1. Spectrum of findings at the lung bases worrisome for progressive fibrotic interstitial lung disease with mild-to-moderate honeycombing, clearly progressive compared to 2015 and 2019 chest CT studies, with more mild progression since 05/02/2019 chest CT. Findings are consistent with UIP per consensus guidelines: Diagnosis of Idiopathic Pulmonary Fibrosis:  An Official ATS/ERS/JRS/ALAT Clinical Practice Guideline. Am Rosezetta Schlatter Crit Care Med Vol 198, Iss 5, [phone], May 07 2017. 2. Extensive chronic fibrocavitary and fibronodular changes in the right greater than left lung apices with evidence of mycetomas in the large irregular thick walled cavitary region in the apical right lung, which has mildly increased in size. 3. One vessel coronary atherosclerosis. 4. Stable mild right paratracheal lymphadenopathy, most compatible with benign reactive etiology. 5. Small hiatal hernia. 6. Cholelithiasis.  Aortic Atherosclerosis (ICD10-I70.0) and Emphysema (ICD10-J43.9).   Electronically Signed   By: Delbert Phenix M.D.    On: 09/26/2019 16:43  ECHO Jan 2021  -- LVH ef 65% ROS - per HPI   OV 10/29/2019  Subjective:  Patient ID: Kimberly Shelton, female , DOB: 04-Jun-1961 , age 42 y.o. , MRN: [id number] , ADDRESS: [address] [place] [address]   10/29/2019 -   Chief Complaint  Patient presents with  . Follow-up    Pt states she has been doing okay since last visit. States she is still occ coughing up blood. Pt states that she is sore/achey in the mornings prior to getting up and will take aleve to help with that.   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 Luciana Axe  #ILD/UIP  - progressive UIP pattern - on HRCt April 2019 -> 2021 Jan  #small hiatl hernia  #coronary artery calcification and baseline heart murmur - dr Clifton James  #RA  - Dr Dierdre Forth HPI Kimberly Shelton 59 y.o. -returns for follow-up.  Last seen in early in February 2021.  Several concerns at that time.  In the interim I presented her case at the multidisciplinary case conference.  The determination was that she had early ILD changes even in April 2019 and is now progressed to a definite UIP pattern.  This is a new finding for [place].  Concern of the conference is that she has rheumatoid arthritis ILD/UIP pattern.  And this is progressive.  There for this visit is to discuss specifically the UIP issues but also round of the other issues.  Again with this diagnosis.  Also discussed the potential for participation in a research registry.  She is interested.  We also discussed antifibrotic therapy nintedanib.  Discussed contraindications which she does not seem to have.  She does not have colitis.  Infectious constipation.  She does not have any  bleeding diathesis.  Not on blood thinners.  She is on voriconazole and Plaquenil and now with nintedanib she will require intensive liver function test monitoring.  She is open for this.  She understands this drug is to help prevent fibrosis getting worse but will not manage her symptoms.  In terms of her ongoing hemoptysis with fibrocavitary bronchiectasis and aspergilloma: She revisited with Dr. Joaquim Lai.  I reviewed the note.  Patient has been restarted on voriconazole 3 days ago on October 26, 2019.  After this the hemoptysis might be slightly better.  Multidisciplinary case conference consideration is for bronchoscopy with lavage but again this has been disrupted because of the COVID-19 pandemic.  She thinks hemoptysis is slightly better but is still present.   In terms of rheumatoid arthritis: She visited with Dr. Dierdre Forth.  I reviewed the note.  She has been asked to continue with the prednisone 5 mg/day.  Plaquenil was been added because of her pain.  She is extremely upset about her rheumatoid arthritis nodules in the bilateral elbow region.  Also with ongoing pain.  In terms of coronary artery calcification based on heart murmur: I referred her to Dr. Angelena Form.  Was rereferral.  I do not see an appointment made.       ROS - per HPI     has a past medical history of Abnormal Pap smear (09/2006), CHF (congestive heart failure) ([place]), Foot fracture, left (10/16), Pneumonia, and RA (rheumatoid arthritis) ([place]) (07/2009).   reports that she quit smoking about 5 years ago. Her smoking use included cigarettes and e-cigarettes. She has a 30.00 pack-year smoking history. She has never used smokeless tobacco.  Past Surgical History:  Procedure Laterality Date  . CERVICAL BIOPSY  W/ LOOP ELECTRODE EXCISION  2008   CIN 3  . CERVICAL CONIZATION W/BX N/A 12/15/2015   Procedure: CONIZATION CERVIX WITH BIOPSY ;  Surgeon: Megan Salon, MD;  Location: [facility];  Service: Gynecology;  Laterality: N/A;   POSSIBLE COLD KNIFE CONE.  Please have the microscope in the room  . COLONOSCOPY    . LEEP N/A 12/15/2015   Procedure: LOOP ELECTROSURGICAL EXCISION PROCEDURE (LEEP) with colpo;  Surgeon: Megan Salon, MD;  Location: [facility];  Service: Gynecology;  Laterality: N/A;  . PILONIDAL CYST EXCISION  1999   I&D  . WISDOM TOOTH EXTRACTION      Allergies  Allergen Reactions  . Bee Venom Anaphylaxis  . Codeine Other (See Comments)    "seeing spots"  . Penicillins Rash    Has patient had a PCN reaction causing immediate rash, facial/tongue/throat swelling, SOB or lightheadedness with hypotension: No Has patient had a PCN reaction causing severe rash involving mucus membranes or skin necrosis: No Has patient had a PCN reaction that required hospitalization No Has patient had a PCN reaction occurring within the last 10 years: Yes If all of the above answers are "NO", then may proceed with Cephalosporin use.     Immunization History  Administered Date(s) Administered  . Influenza Inj Mdck Quad Pf 05/25/2019  . Influenza,inj,Quad PF,6+ Mos 06/25/2015, 05/31/2017, 05/29/2018  . Influenza-Unspecified 06/06/2014  . Pneumococcal Conjugate-13 08/11/2018  . Pneumococcal Polysaccharide-23 02/18/2014  . Tdap 09/01/2016    Family History  Problem Relation Age of Onset  . Diabetes Mother   . Rheum arthritis Mother   . Diabetes Father   . COPD Father   . Bronchitis Father   . Cancer Brother        Lung  . Breast cancer Maternal Aunt 68  . CAD Neg Hx      Current Outpatient Medications:  .  benzonatate (TESSALON) 200 MG capsule, Take 1 capsule (200 mg total) by mouth 3 (three) times daily as needed for cough., Disp: 30 capsule, Rfl: 1 .  escitalopram (LEXAPRO) 10 MG tablet, TAKE 1 TABLET BY MOUTH EVERY DAY, Disp: 90 tablet, Rfl: 2 .  hydroxychloroquine (PLAQUENIL) 200 MG tablet, Take 200 mg by mouth 2 (two) times daily., Disp: , Rfl:  .  naproxen sodium (ALEVE) 220 MG tablet, Take 220 mg by  mouth daily as needed., Disp: , Rfl:  .  predniSONE (DELTASONE) 5 MG tablet, Take 5 mg by mouth 2 (two) times daily., Disp: , Rfl:  .  PROAIR HFA 108 (90 Base) MCG/ACT inhaler, IHALE 2 PUFFS INTO THE LUNGS EVERY 6 HOURS  AS NEEDED FOR WHEEZING OR SHORTNESS OF BREATH, Disp: 8.5 g, Rfl: 3 .  voriconazole (VFEND) 200 MG tablet, Take 1 tablet (200 mg total) by mouth 2 (two) times daily., Disp: 60 tablet, Rfl: 2      Objective:   Vitals:   10/29/19 1508  BP: (!) 122/58  Pulse: 83  SpO2: 98%  Weight: 151 lb 6.4 oz (68.7 kg)  Height: 5\' 4"  (1.626 m)    Estimated body mass index is 25.99 kg/m as calculated from the following:   Height as of this encounter: 5\' 4"  (1.626 m).   Weight as of this encounter: 151 lb 6.4 oz (68.7 kg).  @WEIGHTCHANGE @  [employer]   10/29/19 1508  Weight: 151 lb 6.4 oz (68.7 kg)     Physical Exam Pleasant female has elbow nodules.  Tearful while giving diagnosis of interstitial lung disease.  Alert and oriented x3.          Assessment:       ICD-10-CM   1. 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 Hemoptysis  R04.2   4. Interstitial lung disease due to connective tissue disease (HCC)  J84.89    M35.9   5. Rheumatoid arthritis involving multiple joints (HCC)  M06.9   6. Cardiac murmur  R01.1        Plan:     Patient Instructions  Hemoptysis  - mild and ongoing and due to below lung issues but maybe better after voricanazole restart on 10/26/2019  Plan  - need bronchoscpy with lavage but delays due to covid-19;  will hold off for now  - any massive or major coughing up blood - go to ER - will revisit bronchoscopy at next visit  Pulmonary aspergilloma (HCC) Bronchiectasis without complication (HCC)  -noted back on voricanazole  Plan  - per Dr Luciana Axe - continue voricanazole   Interstitial lung disease due to connective tissue disease (HCC) - UIP pattern, progresive  - you need  an anti-fibrotic (LFT noirmal 10/24/19)  Plan -start ofev 150mg  twice daily (might help your constipation because this drug causes diarrhea) - schedule visit with Amber Yoppp to gov over drug interactioins and get educated on ofev  - this drug requires intensive monitoring - take consent form for ILD-PRO study  - Lavera Guise from [facility] will reach out to you to schedule a visit if you are itnerested -Get spirometry and DLCO in April 2021 -Return to see Dr. [name] in April 2021 in 30-minute slot  Rheumatoid arthritis involving multiple joints (HCC)  -This appears active given the onset of pulmonary nodules and new elbows and also interstitial lung disease in the lung and active joint pain  - I reviewed dR Beekman recent notes  Plan -plaquenil oer Dr Dierdre Forth -Keep your prednisone at 5 mg/day -Avoid biologic treatment for your rheumatoid arthritis but probably okay to start leflunomide  Interatrial cardiac shunt-this appears new on the echocardiogram January 2021 Cardiac murmur -old Coronary artery calcification-old  Plan -Refer Dr. Verne Carrow her cardiologist -make the appointment 10/29/2019   Follow-up - refer Dr Clifton James cards  -r efer Amber Yopp pharmacist for visit next 2-3 weeks -April 2021 do spirometry and DLCO -30-minute slot in April 2021 with Dr [name] - ILD clinic or any day   (Level 04: Estb 30-39 min  visit type: on-site physical face to visit visit spent in total care time and counseling or/and coordination of care by this undersigned MD -  Dr Kalman Shan. This includes one or more of the following on this same day 10/29/2019: pre-charting, chart review, note writing, documentation discussion of test results, diagnostic or treatment recommendations, prognosis, risks and benefits of management options, instructions, education, compliance or risk-factor reduction. It excludes time spent by the CMA or office staff in the care of the patient . Actual  time is 31 min)   SIGNATURE    Dr. Kalman Shan, M.D., F.C.C.P,  Pulmonary and Critical Care Medicine Staff Physician, [facility] Director - Interstitial Lung Disease  Program  Pulmonary Fibrosis [facility] at [facility] [place], [place], [address]  Pager: [phone], If no answer or between  15:00h - 7:00h: call [phone] Telephone: [phone]  3:43 PM 10/29/2019

## 2019-10-29 NOTE — Telephone Encounter (Signed)
Received [employer] Start Paperwork. Will update as we work through the benefits process.  4:28 PM Dorthula Nettles, CPhT

## 2019-10-29 NOTE — Telephone Encounter (Signed)
Called and spoke with pt to see if I could get her scheduled for an appt today 2/22 with MR based on info stated by MR and pt said that was fine. Pt has been scheduled a visit today at 3pm with MR. Nothing further needed.

## 2019-10-29 NOTE — Telephone Encounter (Signed)
THANK YOU

## 2019-10-29 NOTE — Addendum Note (Signed)
Addended by: Wyvonne Lenz on: 10/29/2019 03:49 PM   Modules accepted: Orders

## 2019-10-29 NOTE — Patient Instructions (Addendum)
Hemoptysis  - mild and ongoing and due to below lung issues but maybe better after voricanazole restart on 10/26/2019  Plan  - need bronchoscpy with lavage but delays due to covid-19;  will hold off for now  - any massive or major coughing up blood - go to ER - will revisit bronchoscopy at next visit  Pulmonary aspergilloma (HCC) Bronchiectasis without complication (HCC)  -noted back on voricanazole  Plan  - per Dr Luciana Axe - continue voricanazole   Interstitial lung disease due to connective tissue disease (HCC) - UIP pattern, progresive  - you need an anti-fibrotic (LFT noirmal 10/24/19)  Plan -start ofev 150mg  twice daily (might help your constipation because this drug causes diarrhea) - schedule visit with Amber Yoppp to gov over drug interactioins and get educated on ofev  - this drug requires intensive monitoring - take consent form for ILD-PRO study  - from [facility] will reach out to you to schedule a visit if you are itnerested -Get spirometry and DLCO in April 2021 -Return to see Dr. May 2021 in April 2021 in 30-minute slot  Rheumatoid arthritis involving multiple joints (HCC)  -This appears active given the onset of pulmonary nodules and new elbows and also interstitial lung disease in the lung and active joint pain  - I reviewed dR Beekman recent notes  Plan -plaquenil oer Dr May 2021 -Keep your prednisone at 5 mg/day -Avoid biologic treatment for your rheumatoid arthritis but probably okay to start leflunomide  Interatrial cardiac shunt-this appears new on the echocardiogram January 2021 Cardiac murmur -old Coronary artery calcification-old  Plan -Refer Dr. February 2021 her cardiologist -make the appointment 10/29/2019   Follow-up - refer Dr 10/31/2019 cards  -r efer Amber Yopp pharmacist for visit next 2-3 weeks -April 2021 do spirometry and DLCO -30-minute slot in April 2021 with Dr May 2021 - ILD clinic or any day

## 2019-10-30 ENCOUNTER — Telehealth: Payer: Self-pay | Admitting: Cardiovascular Disease

## 2019-10-30 ENCOUNTER — Other Ambulatory Visit: Payer: Self-pay

## 2019-10-30 MED ORDER — VORICONAZOLE 200 MG PO TABS: 200.0000 mg | ORAL_TABLET | Freq: Two times a day (BID) | ORAL | 2 refills | Status: DC

## 2019-10-30 NOTE — Telephone Encounter (Signed)
Reviewed with Dr Clifton James and he would like to see patient to discuss possible TEE.  I spoke with patient and scheduled her to see Dr Clifton James tomorrow at [facility]

## 2019-10-30 NOTE — Telephone Encounter (Signed)
Submitted a Prior Authorization request to [facility] for OFEV via Cover My Meds. Will update once we receive a response.

## 2019-10-30 NOTE — Telephone Encounter (Signed)
[facility] calling to say they deleted voriconazole script by mistake and will need a new script sent . Script sent to pharmacy.   Laurell Josephs, RN

## 2019-10-30 NOTE — Telephone Encounter (Signed)
Kimberly Shelton, I may be missing something but I can't find anything like that on the CT chest report. I am not sure what she is talking about. Thayer Ohm

## 2019-10-30 NOTE — Telephone Encounter (Signed)
I think it is on the echo report

## 2019-10-30 NOTE — Telephone Encounter (Signed)
[employer] prior authorization for Durel Salts is asking if patient has tried/failed/ contraindication toEsbriet. Would you like me to apply for Esbriet or is there a contraindication?   Thanks! Dorthula Nettles, CPhT

## 2019-10-30 NOTE — Telephone Encounter (Signed)
New Message  Pt called and she says that she saw her lung dr. and he said that he saw something called interial cardiac shunt on the x-ray and she wants to know what that is and wants to know if her heart is ok  Please call to discuss

## 2019-10-31 ENCOUNTER — Encounter: Payer: Self-pay | Admitting: Cardiovascular Disease

## 2019-10-31 ENCOUNTER — Other Ambulatory Visit: Payer: Self-pay

## 2019-10-31 ENCOUNTER — Ambulatory Visit (INDEPENDENT_AMBULATORY_CARE_PROVIDER_SITE_OTHER): Payer: 59 | Admitting: Cardiovascular Disease

## 2019-10-31 VITALS — BP 124/68 | HR 95 | Ht 64.0 in | Wt 157.8 lb

## 2019-10-31 DIAGNOSIS — R931 Abnormal findings on diagnostic imaging of heart and coronary circulation: Secondary | ICD-10-CM

## 2019-10-31 NOTE — Progress Notes (Signed)
Chief Complaint  Patient presents with  . Follow-up    abnormal echo   History of Present Illness: 59 yo female with history of rheumatoid arthritis, former tobacco abuse and bronchiectasis here today for cardiac follow up. I saw her in 2015 for evaluation of tachycardia. She was admitted to [facility] 02/16/2014 through 02/24/2014 with right-sided pneumococcal pneumonia with cavitation in the setting of smoking and rheumatoid arthritis on immune modulators. Echo 2015 with normal LV size and function. No valve disease. She is being followed by pulmonary. She was seen in our office in April 2019 after her echo showed a possible atrial septal aneurysm. LV systolic function normal at that time. She had a repeat echo arranged by Dr. Chase Caller in January 2021 which showed LVEF=60-65% with mild LVH and possible intra-atrial defect. She was seen in our office 10/02/19 via virtual visit with Ermalinda Barrios PA-C who discussed the findings of her echo. A TEE was discussed with the patient to better assess the atrial septum but given her lung issues, she declined a TEE at that time. Of note, chest CT with evidence of coronary artery calcification.   She is here today for follow up. The patient denies any chest pain, palpitations, lower extremity edema, orthopnea, PND, dizziness, near syncope or syncope. No prior strokes. She is at baseline in regards to her lung disease.   Primary Care Physician: Eulas Post, MD  Past Medical History:  Diagnosis Date  . Abnormal Pap smear 09/2006   HGSIL CIN 2/VAIN/ CIN 3/VAIN-3 CIS  . CHF (congestive heart failure) ([place])   . Foot fracture, left 10/16   hair-line fracture  . Pneumonia    was hospitalized  . RA (rheumatoid arthritis) ([place]) 07/2009    Past Surgical History:  Procedure Laterality Date  . CERVICAL BIOPSY  W/ LOOP ELECTRODE EXCISION  2008   CIN 3  . CERVICAL CONIZATION W/BX N/A 12/15/2015   Procedure: CONIZATION CERVIX WITH BIOPSY ;   Surgeon: Megan Salon, MD;  Location: [facility];  Service: Gynecology;  Laterality: N/A;  POSSIBLE COLD KNIFE CONE.  Please have the microscope in the room  . COLONOSCOPY    . LEEP N/A 12/15/2015   Procedure: LOOP ELECTROSURGICAL EXCISION PROCEDURE (LEEP) with colpo;  Surgeon: Megan Salon, MD;  Location: [facility];  Service: Gynecology;  Laterality: N/A;  . PILONIDAL CYST EXCISION  1999   I&D  . WISDOM TOOTH EXTRACTION      Current Outpatient Medications  Medication Sig Dispense Refill  . escitalopram (LEXAPRO) 10 MG tablet TAKE 1 TABLET BY MOUTH EVERY DAY 90 tablet 2  . hydroxychloroquine (PLAQUENIL) 200 MG tablet Take 200 mg by mouth 2 (two) times daily.    . naproxen sodium (ALEVE) 220 MG tablet Take 220 mg by mouth daily as needed.    . predniSONE (DELTASONE) 5 MG tablet Take 5 mg by mouth 2 (two) times daily.    Marland Kitchen PROAIR HFA 108 (90 Base) MCG/ACT inhaler IHALE 2 PUFFS INTO THE LUNGS EVERY 6 HOURS AS NEEDED FOR WHEEZING OR SHORTNESS OF BREATH 8.5 g 3  . voriconazole (VFEND) 200 MG tablet Take 1 tablet (200 mg total) by mouth 2 (two) times daily. 60 tablet 2   No current facility-administered medications for this visit.    Allergies  Allergen Reactions  . Bee Venom Anaphylaxis  . Codeine Other (See Comments)    "seeing spots"  . Penicillins Rash    Has patient had a PCN reaction causing  immediate rash, facial/tongue/throat swelling, SOB or lightheadedness with hypotension: No Has patient had a PCN reaction causing severe rash involving mucus membranes or skin necrosis: No Has patient had a PCN reaction that required hospitalization No Has patient had a PCN reaction occurring within the last 10 years: Yes If all of the above answers are "NO", then may proceed with Cephalosporin use.     Social History   Socioeconomic History  . Marital status: Divorced    Spouse name: Not on file  . Number of children: 1  . Years of education: Not on file  . Highest education level: Not on  file  Occupational History  . Occupation: Stage manager: [employer]  Tobacco Use  . Smoking status: Former Smoker    Packs/day: 1.00    Years: 30.00    Pack years: 30.00    Types: Cigarettes, E-cigarettes    Quit date: 02/16/2014    Years since quitting: 5.7  . Smokeless tobacco: Never Used  Substance and Sexual Activity  . Alcohol use: No    Alcohol/week: 0.0 standard drinks  . Drug use: No  . Sexual activity: Not Currently    Partners: Male    Birth control/protection: Post-menopausal  Other Topics Concern  . Not on file  Social History Narrative  . Not on file   Social Determinants of Health   Financial Resource Strain:   . Difficulty of Paying Living Expenses: Not on file  Food Insecurity:   . Worried About Programme researcher, broadcasting/film/video in the Last Year: Not on file  . Ran Out of Food in the Last Year: Not on file  Transportation Needs:   . Lack of Transportation (Medical): Not on file  . Lack of Transportation (Non-Medical): Not on file  Physical Activity:   . Days of Exercise per Week: Not on file  . Minutes of Exercise per Session: Not on file  Stress:   . Feeling of Stress : Not on file  Social Connections:   . Frequency of Communication with Friends and Family: Not on file  . Frequency of Social Gatherings with Friends and Family: Not on file  . Attends Religious Services: Not on file  . Active Member of Clubs or Organizations: Not on file  . Attends Banker Meetings: Not on file  . Marital Status: Not on file  Intimate Partner Violence:   . Fear of Current or Ex-Partner: Not on file  . Emotionally Abused: Not on file  . Physically Abused: Not on file  . Sexually Abused: Not on file    Family History  Problem Relation Age of Onset  . Diabetes Mother   . Rheum arthritis Mother   . Diabetes Father   . COPD Father   . Bronchitis Father   . Cancer Brother        Lung  . Breast cancer Maternal Aunt 33  . CAD Neg Hx      Review of Systems:  As stated in the HPI and otherwise negative.   BP 124/68   Pulse 95   Ht 5\' 4"  (1.626 m)   Wt 157 lb 12.8 oz (71.6 kg)   LMP 09/06/2004   SpO2 98%   BMI 27.09 kg/m   Physical Examination: General: Well developed, well nourished, NAD  HEENT: OP clear, mucus membranes moist  SKIN: warm, dry. No rashes. Neuro: No focal deficits  Musculoskeletal: Muscle strength 5/5 all ext  Psychiatric: Mood and affect  normal  Neck: No JVD, no carotid bruits, no thyromegaly, no lymphadenopathy.  Lungs:Clear bilaterally, no wheezes, rhonci, crackles Cardiovascular: Regular rate and rhythm. Soft systolic murmur.  Abdomen:Soft. Bowel sounds present. Non-tender.  Extremities: No lower extremity edema. Pulses are 2 + in the bilateral DP/PT.  EKG:  EKG is not ordered today. The ekg ordered today demonstrates   Echo 09/20/19: 1. Left ventricular ejection fraction, by visual estimation, is 60 to  65%. The left ventricle has normal function. There is mildly increased  left ventricular hypertrophy.  2. The left ventricle has no regional wall motion abnormalities.  3. Global right ventricle has normal systolic function.The right  ventricular size is normal. No increase in right ventricular wall  thickness.  4. Left atrial size was normal.  5. Right atrial size was normal.  6. The mitral valve is normal in structure. Trivial mitral valve  regurgitation.  7. The tricuspid valve is normal in structure.  8. The aortic valve is tricuspid. Aortic valve regurgitation is not  visualized. No evidence of aortic valve sclerosis or stenosis.  9. The pulmonic valve was grossly normal. Pulmonic valve regurgitation is  not visualized.  10. The inferior vena cava is normal in size with greater than 50%  respiratory variability, suggesting right atrial pressure of 3 mmHg.  11. Possible left to right shunt across intra-atrial septum.   Recent Labs: 08/13/2019: Hemoglobin 11.2;  Platelets 371 10/24/2019: ALT 6; BUN 14; Creat 0.85; Potassium 5.1; Sodium 140   Lipid Panel    Component Value Date/Time   CHOL 188 09/01/2016 0743   TRIG 113.0 09/01/2016 0743   HDL 77.40 09/01/2016 0743   CHOLHDL 2 09/01/2016 0743   VLDL 22.6 09/01/2016 0743   LDLCALC 88 09/01/2016 0743     Wt Readings from Last 3 Encounters:  10/31/19 157 lb 12.8 oz (71.6 kg)  10/29/19 151 lb 6.4 oz (68.7 kg)  10/24/19 151 lb (68.5 kg)     Assessment and Plan:   1. Atrial septal aneurysm: She has had an atrial septal aneurysm for years. Her echo does not clearly show an ASD. She has no clinical sequelae of a PFO or ASD. We discussed arranging a TEE to definitively exclude an atrial septal defect but at this time, I do not think that is clinically indicated. I reviewed her echo again today with our imaging team.   Current medicines are reviewed at length with the patient today.  The patient does not have concerns regarding medicines.  The following changes have been made:  no change  Labs/ tests ordered today include:  No orders of the defined types were placed in this encounter.   Disposition:   FU with me in 6 months   Signed, Verne Carrow, MD 10/31/2019 9:51 AM    [facility] [address] [place], [place], [place]  [address] Phone: [phone]; Fax: [phone]

## 2019-10-31 NOTE — Telephone Encounter (Signed)
Received a fax regarding Prior Authorization from [facility] for OFEV. Authorization has been DENIED because Patient has not tried/ failed / or have contraindication to Esbriet.  9:13 AM Dorthula Nettles, CPhT

## 2019-10-31 NOTE — Patient Instructions (Signed)
Medication Instructions:  No changes *If you need a refill on your cardiac medications before your next appointment, please call your pharmacy*  Lab Work: none If you have labs (blood work) drawn today and your tests are completely normal, you will receive your results only by: . MyChart Message (if you have MyChart) OR . A paper copy in the mail If you have any lab test that is abnormal or we need to change your treatment, we will call you to review the results.  Testing/Procedures: none  Follow-Up: At [facility], you and your health needs are our priority.  As part of our continuing mission to provide you with exceptional heart care, we have created designated Provider Care Teams.  These Care Teams include your primary Cardiologist (physician) and Advanced Practice Providers (APPs -  Physician Assistants and Nurse Practitioners) who all work together to provide you with the care you need, when you need it.  Your next appointment:   6 month(s)  The format for your next appointment:   Either In Person or Virtual  Provider:   You may see Christopher McAlhany, MD or one of the following Advanced Practice Providers on your designated Care Team:    Dayna Dunn, PA-C  Michele Lenze, PA-C   Other Instructions   

## 2019-10-31 NOTE — Telephone Encounter (Signed)
Submitted a Prior Authorization request to [facility] for ESBRIET via Cover My Meds. Will update once we receive a response.

## 2019-11-05 NOTE — Telephone Encounter (Signed)
Received a fax regarding Prior Authorization from [facility] for ESBRIET. Authorization has been DENIED. [employer] did not indicate reason for denial, will await Denial letter. Pharmacy team will work on the appeal  PA Case ID: [id number]

## 2019-11-07 ENCOUNTER — Other Ambulatory Visit: Payer: Self-pay | Admitting: Internal Medicine

## 2019-11-07 NOTE — Telephone Encounter (Signed)
Received notification from [facility] regarding a prior authorization for ESBRIET. Authorization has been APPROVED from 11/06/19 to 05/08/20.   Authorization # [id number]  Per plan, patient must use [facility]. Patient has commercial plan and is eligible for $5 copay card. Enrolled patient for an Esbriet copay card, Welcome letter with processing info will be mailed to patient.   Ref# [id number] Esbriet# [phone]  8:40 AM Dorthula Nettles, CPhT   8:30 AM Dorthula Nettles, CPhT

## 2019-11-09 NOTE — Telephone Encounter (Signed)
Patient returned call. Notified of denial of Ofev but insurance did approve Esbriet.  Scheduled new start appointment for 3/9 at 1:30pm.   Verlin Fester, PharmD, [facility], CPP Clinical Specialty Pharmacist [phone]  11/09/2019 1:57 PM

## 2019-11-09 NOTE — Telephone Encounter (Signed)
Called to notify about medication approval and schedule a new start visit with the pharmacy team.  No answer.  Left voicemail to return call.  Will follow up Monday.   Verlin Fester, PharmD, [facility], CPP Clinical Specialty Pharmacist [phone]  11/09/2019 11:29 AM

## 2019-11-09 NOTE — Progress Notes (Signed)
Subjective:  Patient presents today to [facility] to see pharmacy team for [facility].  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 She is followed by Dr. 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  She is followed by Dr. Amil Amen at [facility] for RA and Dr. Linus Salmons at [facility] for pulmonary aspergilloma.   She is currently on Plaquenil and prednisone 5 mg daily for RA and voriconazole for pulmonary aspergilloma. Patient is naive to anti-fibrotic therapy. Initially tried for approval of Ofev but insurance prefers Esbriet.  Objective: Allergies  Allergen Reactions  . Bee Venom Anaphylaxis  . Codeine Other (See Comments)    "seeing spots"  . Penicillins Rash    Has patient had a PCN reaction causing immediate rash, facial/tongue/throat swelling, SOB or lightheadedness with hypotension: No Has patient had a PCN reaction causing severe rash involving mucus membranes or skin necrosis: No Has patient had a PCN reaction that required hospitalization No Has patient had a PCN reaction occurring within the last 10 years: Yes If all of the above answers are "NO", then may proceed with Cephalosporin use.     Outpatient Encounter Medications as of 11/13/2019  Medication Sig  . albuterol (VENTOLIN HFA) 108 (90 Base) MCG/ACT inhaler INHALE 2 PUFFS INTO THE LUNGS EVERY 6 HOURS AS NEEDED FOR WHEEZING OR SHORTNESS OF BREATH  . escitalopram (LEXAPRO) 10 MG tablet TAKE 1 TABLET BY MOUTH EVERY DAY  . hydroxychloroquine (PLAQUENIL) 200 MG tablet Take 200 mg by mouth 2 (two) times daily.  . naproxen sodium (ALEVE) 220 MG tablet Take 220 mg by mouth daily as needed.  . predniSONE (DELTASONE) 5 MG tablet Take 5 mg by mouth 2 (two) times daily.  Marland Kitchen voriconazole (VFEND) 200 MG tablet Take 1 tablet (200 mg total) by mouth 2 (two) times daily.   No facility-administered encounter medications on file as of 11/13/2019.     Immunization History    Administered Date(s) Administered  . Influenza Inj Mdck Quad Pf 05/25/2019  . Influenza,inj,Quad PF,6+ Mos 06/25/2015, 05/31/2017, 05/29/2018  . Influenza-Unspecified 06/06/2014  . Pneumococcal Conjugate-13 08/11/2018  . Pneumococcal Polysaccharide-23 02/18/2014  . Tdap 09/01/2016     CT 09/26/2019- Spectrum of findings at the lung bases worrisome for progressive fibrotic interstitial lung disease with mild-to-moderate honeycombing, clearly progressive compared to 2015 and 2019 chest CT studies, with more mild progression since 05/02/2019 chest CT.  PFT's TLC  Date Value Ref Range Status  02/10/2015 3.76 L Final     CMP     Component Value Date/Time   NA 140 10/24/2019 1422   K 5.1 10/24/2019 1422   CL 101 10/24/2019 1422   CO2 30 10/24/2019 1422   GLUCOSE 109 (H) 10/24/2019 1422   BUN 14 10/24/2019 1422   CREATININE 0.85 10/24/2019 1422   CALCIUM 9.4 10/24/2019 1422   PROT 7.2 10/24/2019 1422   ALBUMIN 4.3 09/01/2016 0743   AST 12 10/24/2019 1422   ALT 6 10/24/2019 1422   ALKPHOS 179 (H) 09/01/2016 0743   BILITOT 0.3 10/24/2019 1422   GFRNONAA 75 10/24/2019 1422   GFRAA 87 10/24/2019 1422     CBC    Component Value Date/Time   WBC 11.4 (H) 08/13/2019 1009   RBC 3.93 08/13/2019 1009   HGB 11.2 (L) 08/13/2019 1009   HGB 14.5 05/04/2013 1603   HCT 34.4 (L) 08/13/2019 1009   PLT 371 08/13/2019 1009   MCV 87.5 08/13/2019 1009  MCH 28.5 08/13/2019 1009   MCHC 32.6 08/13/2019 1009   RDW 13.9 08/13/2019 1009   LYMPHSABS 1,296 04/23/2019 1031   MONOABS 0.5 09/01/2016 0743   EOSABS 400 04/23/2019 1031   BASOSABS 113 04/23/2019 1031     LFT's Hepatic Function Latest Ref Rng & Units 10/24/2019 05/21/2019 04/23/2019  Total Protein 6.1 - 8.1 g/dL 7.2 6.4 6.1  Albumin 3.5 - 5.2 g/dL - - -  AST 10 - 35 U/L '12 17 16  ' ALT 6 - 29 U/L '6 16 8  ' Alk Phosphatase 39 - 117 U/L - - -  Total Bilirubin 0.2 - 1.2 mg/dL 0.3 0.3 0.3     Assessment and Plan  1. Esbriet  Medication Management  Patient counseled on purpose, proper use, and potential adverse effects including nausea, vomiting, abdominal pain, GERD, weight loss, arthralgia, dizziness, and suns sensitivity/rash.  Stressed the importance of routine lab monitoring. Will monitor LFT's every month for the first 6 months of treatment then every 3 months. Will monitor CBC every 3 months.  Starting dose will be Esbriet 267 mg 1 tablet three times daily for 7 days, then 2 tablets three times daily for 7 days, then 3 tablets three times daily.  Maintenance dose will be 801 mg 1 tablet three times daily if tolerated.  Stressed the importance of taking with meals to minimize stomach upset.    Esbriet approved through insurance and required to fill at [facility].  She has Pharmacist, community and signed patient up for co-pay card.  She is familiar with specialty pharmacy process from prior RA specialty medications.  2. Medication Reconciliation  A drug regimen assessment was performed, including review of allergies, interactions, disease-state management, dosing and immunization history. Medications were reviewed with the patient, including name, instructions, indication, goals of therapy, potential side effects, importance of adherence, and safe use.  Drug interaction(s): Aleve and Lexapro- increased bleeding risk  Advised patient that this is something we don't typically see in practice but  monitor for signs and symptoms of bleeding.  Recommend Tylenol prn as an alternative to Aleve.  3. Immunizations  Patient is up-to-date with annual influenza vaccine. She received Prevnar 13 in 2019 and Pneumovax 23 in 2015. Patient is eligible to receive another dose of Pneumovax 23. Recommend Shingrix and COVID-19 vaccine.  All questions encouraged and answered.  Instructed patient to call with any further questions or concerns.  Patient was scheduled for follow-up visit with the pharmacy team to update  labs and discuss side effects in 1 month on April 6 at 2:30 PM.  This appointment required  40 minutes of patient care (this includes precharting, chart review, review of results, face-to-face care, etc.).  Mariella Saa, PharmD, [place], [place] Clinical Specialty Pharmacist [phone]  11/13/2019 3:15 PM

## 2019-11-12 ENCOUNTER — Telehealth: Payer: Self-pay | Admitting: Internal Medicine

## 2019-11-12 ENCOUNTER — Other Ambulatory Visit: Payer: Self-pay

## 2019-11-12 ENCOUNTER — Encounter: Payer: Self-pay | Admitting: Internal Medicine

## 2019-11-12 ENCOUNTER — Ambulatory Visit (INDEPENDENT_AMBULATORY_CARE_PROVIDER_SITE_OTHER): Payer: 59 | Admitting: Internal Medicine

## 2019-11-12 VITALS — BP 147/77 | HR 76 | Temp 98.1°F | Wt 150.0 lb

## 2019-11-12 DIAGNOSIS — R042 Hemoptysis: Secondary | ICD-10-CM

## 2019-11-12 DIAGNOSIS — B449 Aspergillosis, unspecified: Secondary | ICD-10-CM

## 2019-11-12 DIAGNOSIS — J984 Other disorders of lung: Secondary | ICD-10-CM

## 2019-11-12 LAB — CBC WITH DIFFERENTIAL/PLATELET
Absolute Monocytes: 646 cells/uL (ref 200–950)
Basophils Absolute: 111 cells/uL (ref 0–200)
Basophils Relative: 1.3 %
Eosinophils Absolute: 145 cells/uL (ref 15–500)
Eosinophils Relative: 1.7 %
HCT: 33.8 % — ABNORMAL LOW (ref 35.0–45.0)
Hemoglobin: 10.8 g/dL — ABNORMAL LOW (ref 11.7–15.5)
Lymphs Abs: 1352 cells/uL (ref 850–3900)
MCH: 26.7 pg — ABNORMAL LOW (ref 27.0–33.0)
MCHC: 32 g/dL (ref 32.0–36.0)
MCV: 83.7 fL (ref 80.0–100.0)
MPV: 9.2 fL (ref 7.5–12.5)
Monocytes Relative: 7.6 %
Neutro Abs: 6248 cells/uL (ref 1500–7800)
Neutrophils Relative %: 73.5 %
Platelets: 387 10*3/uL (ref 140–400)
RBC: 4.04 10*6/uL (ref 3.80–5.10)
RDW: 13 % (ref 11.0–15.0)
Total Lymphocyte: 15.9 %
WBC: 8.5 10*3/uL (ref 3.8–10.8)

## 2019-11-12 LAB — COMPREHENSIVE METABOLIC PANEL
AG Ratio: 1.1 (calc) (ref 1.0–2.5)
ALT: 21 U/L (ref 6–29)
AST: 26 U/L (ref 10–35)
Albumin: 3.6 g/dL (ref 3.6–5.1)
Alkaline phosphatase (APISO): 372 U/L — ABNORMAL HIGH (ref 37–153)
BUN: 10 mg/dL (ref 7–25)
CO2: 29 mmol/L (ref 20–32)
Calcium: 8.9 mg/dL (ref 8.6–10.4)
Chloride: 105 mmol/L (ref 98–110)
Creat: 0.66 mg/dL (ref 0.50–0.99)
Globulin: 3.4 g/dL (calc) (ref 1.9–3.7)
Glucose, Bld: 77 mg/dL (ref 65–99)
Potassium: 4.6 mmol/L (ref 3.5–5.3)
Sodium: 142 mmol/L (ref 135–146)
Total Bilirubin: 0.3 mg/dL (ref 0.2–1.2)
Total Protein: 7 g/dL (ref 6.1–8.1)

## 2019-11-12 NOTE — Assessment & Plan Note (Signed)
Stable and undergoing further evaluation for consideration of other treatment options.

## 2019-11-12 NOTE — Telephone Encounter (Signed)
Refer to ENT to make sure is not epistaxis  She needs a bronch - can they give her a slot in endo at [facility] week of 11/26/19 or  11/15/19 afternoon at [facility] or 11/16/19 early afternoon strat[place] after clnic at [facility] -   Any massive hemoptysis go to ER

## 2019-11-12 NOTE — Telephone Encounter (Signed)
Called and spoke to pt. Pt c/o hemoptysis that started today. Pt saw Dr. Luciana Axe (ID provider) today for an appt and he advised pt to let MR know if the hemoptysis continues till tomorrow then she should be seen in office. Per Dr. Ephriam Knuckles note he thinks the blood may be coming from her nose. Pt states she has had 6-7 episodes today. Pt is cough up clear mucus with bright red blood mixed in with mucus and is a little amount each time, pt states it would be hard to quantify as it is such a little amount each time. Pt denies CP/tightness, swelling, f/c/s, unexplained body aches, SOB. Pt has an appt with Pharmacy on 3/9 at 1:30pm.   Dr. [name] please advise. Thanks.

## 2019-11-12 NOTE — Progress Notes (Signed)
Patient ID: Kimberly Shelton, female   DOB: 1961-04-30, 59 y.o.   MRN: [id number]   Subjective:    Patient ID: Kimberly Shelton, female    DOB: [birth year], 60 y.o.   MRN: [id number]  HPI Here for follow up for pulmonary cavitary lesions.  I saw her last year and noted bronchiectasis and had a positive aspergillus Ig positive with negative Ag and no growth on sputum culture.  She had been relatively asymptomatic.  She more recently though has developed hemoptysis and a follow up CT scan with cavitary areas.  I put her on voriconazole and she took this for 3 months.  A repeat CT scan showed some improvement.    I did a televisit with her 11/25 and she noted some blood-tinged sputum.  Also talked to Ames Dura, NP at the pulmonary clinic.  Since then, it has mainly been light pink sputum, no overt blood.  No worsening symptoms. She completed prolonged voriconazole but returned to Dr. [name] and had some progression of symptoms and follow up CT with mild progression.   I started her back on voriconazole and she is tolerating it well.  She did cough up blood today.   Only one time so far.  No associated fever or chills.     Review of Systems  Constitutional: Negative for chills and fever.  Respiratory: Negative for cough, shortness of breath, wheezing and stridor.   Skin: Negative for rash.       Objective:   Physical Exam Constitutional:      Appearance: Normal appearance.  Eyes:     General: No scleral icterus. Pulmonary:     Effort: Pulmonary effort is normal. No respiratory distress.  Skin:    Findings: No rash.  Neurological:     Mental Status: She is alert.  Psychiatric:        Mood and Affect: Mood normal.    SH: not vaping       Assessment & Plan:

## 2019-11-12 NOTE — Assessment & Plan Note (Signed)
This has been an intermittent issue.  From her description of the taste and  Blood in the nasal passages, I suspect this is related to her nose but I told her if it persists, to get in touch with Dr. Jane Canary office or go to the ED for evaluation.  I will check a Hgb as well to be sure it is not decreasing.

## 2019-11-12 NOTE — Assessment & Plan Note (Signed)
On voriconazole and tolerating.  Continue for 2 more months.  rtc in 1 month for check in.

## 2019-11-12 NOTE — Telephone Encounter (Signed)
Referral to ENT made  Please advise if pt is TB risk, do you want fluro, and what size scope  Then we can call and schedule bronch, thanks!

## 2019-11-12 NOTE — Telephone Encounter (Signed)
Moderate TB risk + Small scope Just wairway exam and no biopsy but yes for lavage Moderate sedation fine No fluoro

## 2019-11-13 ENCOUNTER — Ambulatory Visit (INDEPENDENT_AMBULATORY_CARE_PROVIDER_SITE_OTHER): Payer: 59 | Admitting: Pharmacist

## 2019-11-13 ENCOUNTER — Other Ambulatory Visit: Payer: Self-pay

## 2019-11-13 DIAGNOSIS — J8489 Other specified interstitial pulmonary diseases: Secondary | ICD-10-CM

## 2019-11-13 DIAGNOSIS — M359 Systemic involvement of connective tissue, unspecified: Secondary | ICD-10-CM

## 2019-11-13 DIAGNOSIS — Z7189 Other specified counseling: Secondary | ICD-10-CM

## 2019-11-13 MED ORDER — ESBRIET 267 MG PO TABS: ORAL_TABLET | ORAL | 0 refills | Status: AC

## 2019-11-13 NOTE — Telephone Encounter (Signed)
Spoke with [place], [facility]. Pt's bronch has been moved from being done at [facility] to being done at [facility] due to MR having another bronch 11/27/19 at [facility]. Pt's bronch is still scheduled to be done on 3/23 but now location is [facility] and time is 2:30 for the scheduled bronch.  Called and spoke with pt letting her know this and she verbalized understanding.

## 2019-11-13 NOTE — Patient Instructions (Addendum)
   Your prescription was sent to [facility]. They should call within 24-48 hours to set up your first shipment.  If they do not call by Friday please call them at [phone].  If you do not receive your co-pay card information in the mail please call [phone] ([phone]) and press 2 to sign up for a new card.  Consider using Tylenol as needed as an alternative to Aleve

## 2019-11-13 NOTE — Telephone Encounter (Signed)
Bronch scheduled for 11/27/19 at [facility] at 12:00  Pt aware and covid test 11/24/19 Will forward to MR to be made aware

## 2019-11-16 ENCOUNTER — Telehealth: Payer: Self-pay | Admitting: Primary Care

## 2019-11-16 ENCOUNTER — Telehealth: Payer: Self-pay | Admitting: Internal Medicine

## 2019-11-16 ENCOUNTER — Other Ambulatory Visit: Payer: Self-pay | Admitting: Primary Care

## 2019-11-16 NOTE — Telephone Encounter (Signed)
Spoke with pt, states she woke up early this morning and had another episode of hemoptysis.  States that she coughed up roughly a quarter-sized amount of dark red (old looking) blood.  Since this episode, pt has had had a prod cough with mucus tinged with dark red blood.  No bright red blood, no fever, no chest pain, chest trauma, sob.  Pt notes she is also experiencing nausea, but believes this is just due to anxiety from the hemoptysis.    Per chart it looks like this has been happening multiple times to pt and is scheduled for a bronch on 3/23 with MR to follow up on this.  Pt states she wanted to make MR aware and see if anything further is needed.    Pharmacy: [facility] in [place].    Sending to APP of the day since MR is not in clinic.  Beth please advise if any further recommendations.  Thanks!

## 2019-11-16 NOTE — Telephone Encounter (Signed)
Yes, that's ok with me

## 2019-11-16 NOTE — Telephone Encounter (Signed)
Continue to monitor. If coughing up more than a 250cc needs to present to ED. Recommend cough suppressant like delsym or tessalon.

## 2019-11-16 NOTE — Telephone Encounter (Signed)
Beth, please advise if you are okay with [place] refilling pt's benzonatate. Thanks!

## 2019-11-16 NOTE — Telephone Encounter (Signed)
Spoke with pt, aware of recs.  Nothing further needed at this time- will close encounter.   

## 2019-11-16 NOTE — Telephone Encounter (Signed)
This is a duplicate encounter so closing encounter.

## 2019-11-16 NOTE — Telephone Encounter (Signed)
Called and spoke with pt letting her know that the benzonatate rx was sent to pharmacy for her and pt verbalized understanding.nothing further needed.

## 2019-11-20 ENCOUNTER — Telehealth: Payer: Self-pay | Admitting: Internal Medicine

## 2019-11-20 ENCOUNTER — Ambulatory Visit (INDEPENDENT_AMBULATORY_CARE_PROVIDER_SITE_OTHER): Payer: 59 | Admitting: Otolaryngology

## 2019-11-20 ENCOUNTER — Encounter (INDEPENDENT_AMBULATORY_CARE_PROVIDER_SITE_OTHER): Payer: Self-pay | Admitting: Otolaryngology

## 2019-11-20 ENCOUNTER — Other Ambulatory Visit: Payer: Self-pay

## 2019-11-20 VITALS — Temp 97.3°F

## 2019-11-20 DIAGNOSIS — R042 Hemoptysis: Secondary | ICD-10-CM

## 2019-11-20 NOTE — Progress Notes (Signed)
HPI: Kimberly Shelton is a 59 y.o. female who presents is referred by Dr. [name] for evaluation of chronic hemoptysis she has had for several months.  She has a long history of bronchiectasis and is chronically coughing and frequently coughs up blood in the mornings.  She is referred here to rule out epistaxis as possible etiology of her hemoptysis.  She has known history of chronic interstitial lung disease and is tentatively scheduled for bronchoscopy.. She has mild nasal congestion but does not describe any bright red bleeding from the front of her nose.  She does relate that she does occasionally blow some bloody mucus from her nose but not on a regular basis.  Past Medical History:  Diagnosis Date  . Abnormal Pap smear 09/2006   HGSIL CIN 2/VAIN/ CIN 3/VAIN-3 CIS  . CHF (congestive heart failure) (HCC)   . Foot fracture, left 10/16   hair-line fracture  . Pneumonia    was hospitalized  . RA (rheumatoid arthritis) (HCC) 07/2009   Past Surgical History:  Procedure Laterality Date  . CERVICAL BIOPSY  W/ LOOP ELECTRODE EXCISION  2008   CIN 3  . CERVICAL CONIZATION W/BX N/A 12/15/2015   Procedure: CONIZATION CERVIX WITH BIOPSY ;  Surgeon: Jerene Bears, MD;  Location: [facility];  Service: Gynecology;  Laterality: N/A;  POSSIBLE COLD KNIFE CONE.  Please have the microscope in the room  . COLONOSCOPY    . LEEP N/A 12/15/2015   Procedure: LOOP ELECTROSURGICAL EXCISION PROCEDURE (LEEP) with colpo;  Surgeon: Jerene Bears, MD;  Location: [facility];  Service: Gynecology;  Laterality: N/A;  . PILONIDAL CYST EXCISION  1999   I&D  . WISDOM TOOTH EXTRACTION     Social History   Socioeconomic History  . Marital status: Divorced    Spouse name: Not on file  . Number of children: 1  . Years of education: Not on file  . Highest education level: Not on file  Occupational History  . Occupation: Stage manager: [employer]  Tobacco Use  . Smoking status: Former Smoker   Packs/day: 1.00    Years: 30.00    Pack years: 30.00    Types: Cigarettes, E-cigarettes    Start date: 1985    Quit date: 02/16/2014    Years since quitting: 5.7  . Smokeless tobacco: Never Used  Substance and Sexual Activity  . Alcohol use: No    Alcohol/week: 0.0 standard drinks  . Drug use: No  . Sexual activity: Not Currently    Partners: Male    Birth control/protection: Post-menopausal  Other Topics Concern  . Not on file  Social History Narrative  . Not on file   Social Determinants of Health   Financial Resource Strain:   . Difficulty of Paying Living Expenses:   Food Insecurity:   . Worried About Programme researcher, broadcasting/film/video in the Last Year:   . Barista in the Last Year:   Transportation Needs:   . Freight forwarder (Medical):   Marland Kitchen Lack of Transportation (Non-Medical):   Physical Activity:   . Days of Exercise per Week:   . Minutes of Exercise per Session:   Stress:   . Feeling of Stress :   Social Connections:   . Frequency of Communication with Friends and Family:   . Frequency of Social Gatherings with Friends and Family:   . Attends Religious Services:   . Active Member of Clubs or Organizations:   .  Attends Banker Meetings:   Marland Kitchen Marital Status:    Family History  Problem Relation Age of Onset  . Diabetes Mother   . Rheum arthritis Mother   . Diabetes Father   . COPD Father   . Bronchitis Father   . Cancer Brother        Lung  . Breast cancer Maternal Aunt 73  . CAD Neg Hx    Allergies  Allergen Reactions  . Bee Venom Anaphylaxis  . Codeine Other (See Comments)    "seeing spots"  . Penicillins Rash    Has patient had a PCN reaction causing immediate rash, facial/tongue/throat swelling, SOB or lightheadedness with hypotension: No Has patient had a PCN reaction causing severe rash involving mucus membranes or skin necrosis: No Has patient had a PCN reaction that required hospitalization No Has patient had a PCN reaction  occurring within the last 10 years: Yes If all of the above answers are "NO", then may proceed with Cephalosporin use.    Prior to Admission medications   Medication Sig Start Date End Date Taking? Authorizing Provider  albuterol (VENTOLIN HFA) 108 (90 Base) MCG/ACT inhaler INHALE 2 PUFFS INTO THE LUNGS EVERY 6 HOURS AS NEEDED FOR WHEEZING OR SHORTNESS OF BREATH 11/07/19  Yes Ramaswamy, Carmin Muskrat, MD  benzonatate (TESSALON) 200 MG capsule TAKE 1 CAPSULE(200 MG) BY MOUTH THREE TIMES DAILY AS NEEDED FOR COUGH 11/16/19  Yes Glenford Bayley, NP  EPINEPHrine (EPIPEN 2-PAK) 0.3 mg/0.3 mL IJ SOAJ injection Inject 0.3 mg into the muscle as needed for anaphylaxis.   Yes [provider]  escitalopram (LEXAPRO) 10 MG tablet TAKE 1 TABLET BY MOUTH EVERY DAY 05/21/19  Yes Jerene Bears, MD  hydroxychloroquine (PLAQUENIL) 200 MG tablet Take 200 mg by mouth 2 (two) times daily. 10/17/19  Yes [provider]  naproxen sodium (ALEVE) 220 MG tablet Take 220 mg by mouth daily as needed.   Yes [provider]  Pirfenidone (ESBRIET) 267 MG TABS Take 1 tablet (267 mg total) by mouth 3 (three) times daily for 7 days, THEN 2 tablets (534 mg total) 3 (three) times daily for 7 days, THEN 3 tablets (801 mg total) 3 (three) times daily for 16 days. 11/13/19 12/13/19 Yes Kalman Shan, MD  predniSONE (DELTASONE) 5 MG tablet Take 5 mg by mouth daily with breakfast.  07/10/19  Yes [provider]  voriconazole (VFEND) 200 MG tablet Take 1 tablet (200 mg total) by mouth 2 (two) times daily. 10/30/19  Yes Comer, Belia Heman, MD     Positive ROS: Otherwise negative  All other systems have been reviewed and were otherwise negative with the exception of those mentioned in the HPI and as above.  Physical Exam: Constitutional: Alert, well-appearing, no acute distress Ears: External ears without lesions or tenderness. Ear canals are clear bilaterally with intact, clear TMs bilaterally. Nasal: External  nose without lesions. Septum is slightly deviated to the right.. Clear nasal passages on anterior rhinoscopy.  Nasal endoscopy was performed at bedside and on nasal endoscopy middle meatus regions were clear bilaterally.  Posterior nasal cavity was clear with no intranasal masses noted.  Nasopharynx was clear on either side with no obvious etiology of epistaxis sores nosebleeds. Fiberoptic laryngoscopy of the base of tongue vallecula and epiglottis were normal.  Vocal cords were clear bilaterally.  Hypopharynx was clear with no etiology of bleeding.. Oral: Lips and gums without lesions. Tongue and palate mucosa without lesions. Posterior oropharynx clear.  No etiology of  bleeding noted on indirect laryngoscopy. Neck: No palpable adenopathy or masses Respiratory: Breathing comfortably  Skin: No facial/neck lesions or rash noted.  Procedures  Assessment: Clear nasal examination as well as clear upper airway examination on fiberoptic laryngoscopy.  Plan: Reassured her of normal nasal exam as well as upper airway examination.   Radene Journey, MD   CC:

## 2019-11-20 NOTE — Telephone Encounter (Signed)
Forwarding to pharm team per Irving Burton to see if they have received anything on this pt from [facility]. Thanks.

## 2019-11-21 ENCOUNTER — Telehealth: Payer: Self-pay | Admitting: Internal Medicine

## 2019-11-21 NOTE — Telephone Encounter (Signed)
Pharmacy team has not received any forms. But I printed a prescriber form to fill out and spoke to patient. Email her the patient form to complete and return.  Thanks! Dorthula Nettles, CPhT

## 2019-11-22 ENCOUNTER — Ambulatory Visit (INDEPENDENT_AMBULATORY_CARE_PROVIDER_SITE_OTHER): Payer: 59 | Admitting: Otolaryngology

## 2019-11-22 LAB — AFB CULTURE WITH SMEAR (NOT AT ARMC)
Acid Fast Culture: NEGATIVE
Acid Fast Smear: NEGATIVE

## 2019-11-22 NOTE — Telephone Encounter (Signed)
Patient return signed patient consent. Awaiting MD signature for provider page.

## 2019-11-24 ENCOUNTER — Other Ambulatory Visit (HOSPITAL_COMMUNITY)
Admission: RE | Admit: 2019-11-24 | Discharge: 2019-11-24 | Disposition: A | Discharge status: Home / Self Care | Payer: 59 | Source: Ambulatory Visit | Attending: Internal Medicine | Admitting: Internal Medicine

## 2019-11-24 DIAGNOSIS — Z20822 Contact with and (suspected) exposure to covid-19: Secondary | ICD-10-CM | POA: Insufficient documentation

## 2019-11-24 DIAGNOSIS — Z01812 Encounter for preprocedural laboratory examination: Secondary | ICD-10-CM | POA: Diagnosis present

## 2019-11-24 LAB — SARS CORONAVIRUS 2 (TAT 6-24 HRS): SARS Coronavirus 2: NEGATIVE

## 2019-11-26 ENCOUNTER — Telehealth: Payer: Self-pay | Admitting: Internal Medicine

## 2019-11-26 NOTE — Telephone Encounter (Signed)
Hi Emily,  Do you know if Dr. [name] has had a chance to sign patient's Esbriet forms? It was placed in his box last week.  Thanks! Dorthula Nettles, CPhT

## 2019-11-26 NOTE — Telephone Encounter (Signed)
Yes, please close. Thanks!

## 2019-11-26 NOTE — Telephone Encounter (Signed)
Pharmacy, please advise if this message can be closed. There is another phone note open from 3/16 about paperwork.

## 2019-11-26 NOTE — Telephone Encounter (Signed)
Spoke with pt, answered questions to best of my ability.  Nothing further needed at this time- will close encounter.

## 2019-11-26 NOTE — Telephone Encounter (Signed)
Spoke to patient and advised that we are awaiting MD's signature on her Esbriet form. Advised patient form has been filled out and placed in Dr's box and we will fax as soon as it is signed.  Patient is fine and said she just wanted to follow up. Patient has a procedure with Dr. [name] tomorrow.  9:43 AM Dorthula Nettles, CPhT

## 2019-11-26 NOTE — Telephone Encounter (Signed)
Forms have not been signed yet. MR will be coming into office 3/25 at 12pm for a consult appt. I will have forms on his desk in office for him to sign.  Routing to MR as an FYI that this will need to be signed when you come into the office for the consult 3/25.

## 2019-11-26 NOTE — Telephone Encounter (Signed)
Noted Will sign off 

## 2019-11-27 ENCOUNTER — Ambulatory Visit (HOSPITAL_COMMUNITY)
Admission: RE | Admit: 2019-11-27 | Discharge: 2019-11-27 | Disposition: A | Discharge status: Home / Self Care | Payer: 59 | Attending: Internal Medicine | Admitting: Internal Medicine

## 2019-11-27 ENCOUNTER — Other Ambulatory Visit: Payer: Self-pay

## 2019-11-27 ENCOUNTER — Encounter (HOSPITAL_COMMUNITY): Payer: Self-pay | Admitting: Internal Medicine

## 2019-11-27 ENCOUNTER — Encounter (HOSPITAL_COMMUNITY)
Admission: RE | Disposition: A | Discharge status: Home / Self Care | Payer: Self-pay | Source: Home / Self Care | Attending: Internal Medicine

## 2019-11-27 DIAGNOSIS — R042 Hemoptysis: Secondary | ICD-10-CM

## 2019-11-27 DIAGNOSIS — Z87891 Personal history of nicotine dependence: Secondary | ICD-10-CM | POA: Insufficient documentation

## 2019-11-27 DIAGNOSIS — J849 Interstitial pulmonary disease, unspecified: Secondary | ICD-10-CM | POA: Insufficient documentation

## 2019-11-27 DIAGNOSIS — J439 Emphysema, unspecified: Secondary | ICD-10-CM | POA: Diagnosis not present

## 2019-11-27 DIAGNOSIS — I700 Atherosclerosis of aorta: Secondary | ICD-10-CM | POA: Diagnosis not present

## 2019-11-27 DIAGNOSIS — I509 Heart failure, unspecified: Secondary | ICD-10-CM | POA: Diagnosis not present

## 2019-11-27 DIAGNOSIS — M069 Rheumatoid arthritis, unspecified: Secondary | ICD-10-CM | POA: Insufficient documentation

## 2019-11-27 HISTORY — PX: BRONCHIAL WASHINGS: SHX5105

## 2019-11-27 HISTORY — PX: VIDEO BRONCHOSCOPY: SHX5072

## 2019-11-27 LAB — BODY FLUID CELL COUNT WITH DIFFERENTIAL
Lymphs, Fluid: 3 %
Monocyte-Macrophage-Serous Fluid: 3 % — ABNORMAL LOW (ref 50–90)
Neutrophil Count, Fluid: 94 % — ABNORMAL HIGH (ref 0–25)
Total Nucleated Cell Count, Fluid: 23000 cu mm — ABNORMAL HIGH (ref 0–1000)

## 2019-11-27 SURGERY — VIDEO BRONCHOSCOPY WITHOUT FLUORO
Anesthesia: Moderate Sedation

## 2019-11-27 MED ORDER — BUTAMBEN-TETRACAINE-BENZOCAINE 2-2-14 % EX AERO: 1.0000 | INHALATION_SPRAY | Freq: Once | CUTANEOUS | Status: DC

## 2019-11-27 MED ORDER — MIDAZOLAM HCL (PF) 10 MG/2ML IJ SOLN
INTRAMUSCULAR | Status: DC | PRN
  Administered 2019-11-27: 2 mg via INTRAVENOUS
  Administered 2019-11-27 (×4): 1 mg via INTRAVENOUS

## 2019-11-27 MED ORDER — LIDOCAINE HCL (PF) 1 % IJ SOLN
INTRAMUSCULAR | Status: DC | PRN
  Administered 2019-11-27: 4 mg
  Administered 2019-11-27 (×4): 2 mL

## 2019-11-27 MED ORDER — MIDAZOLAM HCL (PF) 5 MG/ML IJ SOLN
INTRAMUSCULAR | Status: AC
  Filled 2019-11-27: qty 2

## 2019-11-27 MED ORDER — LIDOCAINE HCL URETHRAL/MUCOSAL 2 % EX GEL
CUTANEOUS | Status: AC
  Filled 2019-11-27: qty 30

## 2019-11-27 MED ORDER — FENTANYL CITRATE (PF) 100 MCG/2ML IJ SOLN
INTRAMUSCULAR | Status: DC | PRN
  Administered 2019-11-27 (×5): 25 ug via INTRAVENOUS

## 2019-11-27 MED ORDER — FENTANYL CITRATE (PF) 100 MCG/2ML IJ SOLN
INTRAMUSCULAR | Status: AC
  Filled 2019-11-27: qty 4

## 2019-11-27 MED ORDER — LIDOCAINE HCL URETHRAL/MUCOSAL 2 % EX GEL
CUTANEOUS | Status: DC | PRN
  Administered 2019-11-27: 1

## 2019-11-27 MED ORDER — PHENYLEPHRINE HCL 0.25 % NA SOLN: 1.0000 | Freq: Four times a day (QID) | NASAL | Status: DC | PRN

## 2019-11-27 MED ORDER — PHENYLEPHRINE HCL 0.5 % NA SOLN
NASAL | Status: DC | PRN
  Administered 2019-11-27: 1 [drp] via NASAL

## 2019-11-27 MED ORDER — LIDOCAINE HCL URETHRAL/MUCOSAL 2 % EX GEL: 1.0000 "application " | Freq: Once | CUTANEOUS | Status: DC

## 2019-11-27 MED ORDER — BUTAMBEN-TETRACAINE-BENZOCAINE 2-2-14 % EX AERO
INHALATION_SPRAY | CUTANEOUS | Status: DC | PRN
  Administered 2019-11-27: 1 via TOPICAL

## 2019-11-27 NOTE — Op Note (Signed)
Name:  [name] MRN:  [id number] DOB:  1961-09-04  PROCEDURE NOTE  Procedure(s): Flexible bronchoscopy [phone]) Bronchial alveolar lavage [phone]) of the Right upper lobe entir  Indications:  Hemoptysis, immunesuppression, RUL cavity with aspergilloma, ILD  Consent:  Procedure, benefits, risks and alternatives discussed.  Questions answered.  Consent obtained.  Anesthesia:  Moderate Sedation  Location: [facility] long endoscopy suite  Procedure summary:  Appropriate equipment was assembled.  The patient was brought to the procedure suite room and identified as Kimberly Shelton with [birth year]  Safety timeout was performed. The patient was placed supine on the  table, airway and moderate sedation administered by this operator  After the appropriate level of moderation was assured, flexible video bronchoscope was lubricated and inserted through the endotracheal tube.  T1% Lidocaine were administered through the bronchoscope to augment moderate sedation  Airway examination was yes performed bilaterally to subsegmental level.  Minimal clear secretions were noted, mucosa appeared normal and no endobronchial lesions were identified. There might have been some blood initially from RB6 (RLL superior segment ) but not sure because it cleared up fast versus was some pooled blood from coughing  Bronchial alveolar lavage of the RUL was performed with 20 mL of normal saline  Washed out and then 40cc x 3* number of times for total volume of 120 mL. Total return of 30 mL of fluid, after which the bronchoscope was withdrawn. THE COLOR Was  PINK  (patient latter affirmed is color of hemoptysis)  After hemostasis was assure, the bronchoscope was withdrawn.  The patient was recovered and then  transferred to recovery area  Post-procedure chest x-ray was NOT ordered.  Specimens sent: Bronchial alveolar lavage specimen of the RUL for cell count microbiology and cytology.  Complications:  No immediate  complications were noted.  Hemodynamic parameters and oxygenation remained stable throughout the procedure.  Estimated blood loss:  none  IMPRESSION 1. Normal airway 2. Abnormal RUL lavage - pinkish color   Followup Future Appointments  Date Time Provider [place]  12/10/2019  9:15 AM Comer, Okey Regal, MD [facility]  12/11/2019  2:30 PM [facility] PHARMACIST [facility] None  01/01/2020 10:30 AM Brand Males, MD [facility] None  01/01/2020 12:15 PM [facility]-SCREENING [facility] None  01/04/2020  3:00 PM [facility]-PFT RM [facility] None  05/05/2020  2:30 PM Megan Salon, MD [place] None     Dr. Brand Males, M.D., F.C.C.P Pulmonary and Critical Care Medicine Staff Physician [facility] Pager: [phone], If no answer or between  15:00h - 7:00h: call [phone]  11/27/2019 3:30 PM

## 2019-11-27 NOTE — Interval H&P Note (Signed)
Bronch pre- bronch eval  Presented for bronch.  NPO confirmed. No new symptoms. Exercising daily. Lost weight intentionally. Pain better. Yet to start esbriet. - paperwork ongoing. Still with mild hemoptysis but better in color and ? Quantity after starting voricconazole  O Neuro: Alert and Oriented x 3. GCS 15. Speech normal Psych: Pleasant Resp: Clear to ausucultation bilaterally. No wheeze No crackles HEENT: Normal upper airway. PEERL +. No post nasal drip Abd soft Moves all 4s HEart murmur + baseline  CT  ches visualized  A Hemoptysis with RUL cavity and aspergilloma ILD Fibrocavitary ung  P Bronch video flexible with BAL RUL and airway exam  Risks of pneumothorax, hemothorax, sedation/anesthesia complications such as cardiac or respiratory arrest or hypotension, stroke and bleeding all explained. Benefits of diagnosis but limitations of non-diagnosis also explained. Patient verbalized understanding and wished to proceed.      SIGNATURE    Dr. Brand Males, M.D., F.C.C.P,  Pulmonary and Critical Care Medicine Staff Physician, [place] Director - Interstitial Lung Disease  Program  P[facility] [place] [facility] [place], [place], [address]  Pager: [phone], If no answer or between  15:00h - 7:00h: call [phone] Telephone: [phone]  2:41 PM 11/27/2019

## 2019-11-27 NOTE — Discharge Instructions (Signed)
Please have someone to drive you home  Please be careful with activities for next 24 hours  You can eat 2-4 hours after getting home provided you are fully alert, able to cough, and are not nauseated or vomiting and     feel well  You are expected to have low grade fever or cough some amount of blood for next 24-48 hours; if this worsens call us  IF you are very short of breath or coughing blood or chest pain or not feeling well, call us [phone] anytime or go to emergency room  Please call [phone] for followup appointment or we will call you and fix one  FOLLOWUP Future Appointments  Date Time Provider Department Center  12/10/2019  9:15 AM Comer, Belia Heman, MD [facility]  12/11/2019  2:30 PM [facility] PHARMACIST [facility] None  01/01/2020 10:30 AM Kalman Shan, MD [facility] None  01/01/2020 12:15 PM [facility]-SCREENING [facility] None  01/04/2020  3:00 PM [facility]-PFT RM [facility] None  05/05/2020  2:30 PM Jerene Bears, MD [facility] None

## 2019-11-28 ENCOUNTER — Telehealth: Payer: Self-pay | Admitting: Internal Medicine

## 2019-11-28 LAB — ACID FAST SMEAR (AFB, MYCOBACTERIA): Acid Fast Smear: NEGATIVE

## 2019-11-28 LAB — PNEUMOCYSTIS JIROVECI SMEAR BY DFA: Pneumocystis jiroveci Ag: NEGATIVE

## 2019-11-28 NOTE — Telephone Encounter (Signed)
Called and spoke to pt. Informed her of recs per MR. Pt verbalized understanding and denied any further questions or concerns at this time.

## 2019-11-28 NOTE — Telephone Encounter (Signed)
Called and spoke to pt. Pt had a bronch on 3/23 and woke up this morning with hemoptysis. Pt states she has had two episodes where she coughed and had bright red blood mixed with her mucus. Pt states together the two episodes equal to about 1 teaspoon of blood. Pt denies any other s/s and states overall she feels well. Pt wanting MR to know as she is a little worried. Pt is not on any anticoags. Pt aware to seek emergency care if she becomes increasingly SOB, having CP/tightness or any other worsening s/s.   MR please advise if you have any recs for pt. Thanks.

## 2019-11-28 NOTE — Telephone Encounter (Signed)
1.  The hemoptysis is coming because of deep-seated lung issues from the cavity and the fungal ball 2.  I do not see any cancer or any growth in the main airways to account for the hemoptysis 3.  Based on the above findings I suspect she is going to have hemoptysis at some "baseline" level at all times 4.  Any change in baseline such as this morning she should call.  5.  This morning's hemoptysis is most likely as a result of the bronchoscopy.  She certainly keep an eye.  If the hemoptysis becomes submassive or massive Claiborne Billings is more than quarter cup a day either in single quantity or cumulative] she needs to go to the ER for consideration of her procedure "embolization 6/.  We are still waiting on the culture results but the preliminary result suggests the lungs overall are inflamed consistent with what is already going on

## 2019-11-29 ENCOUNTER — Telehealth: Payer: Self-pay | Admitting: Internal Medicine

## 2019-11-29 ENCOUNTER — Encounter: Payer: Self-pay | Admitting: *Deleted

## 2019-11-29 LAB — CYTOLOGY - NON PAP

## 2019-11-29 LAB — MTB RIF NAA NON-SPUTUM, W/O CULTURE

## 2019-11-29 NOTE — Telephone Encounter (Signed)
Forms have been located, signed by MR and given to [facility] with Pharmacy.  Nothing further needed at this time

## 2019-11-29 NOTE — Telephone Encounter (Signed)
Received signed MD forms, faxed to [facility] and [employer]. Called patient to advise.  2:44 PM Dorthula Nettles, CPhT

## 2019-11-29 NOTE — Telephone Encounter (Signed)
Forms have been located, signed by MR and given to Rachael with Pharmacy.  Nothing further needed at this time 

## 2019-11-29 NOTE — Telephone Encounter (Signed)
Results are non -diagnostic. No growth of bacteria or Tb. coughuing blood therefor is due to fungus and breathing tube irrittion. All we can do is keep an eye  She can do a tele visit in 1-2 weeks if she wants with an app to go over results before my visit end of apil

## 2019-11-30 ENCOUNTER — Telehealth: Payer: Self-pay | Admitting: Internal Medicine

## 2019-11-30 LAB — CULTURE, RESPIRATORY W GRAM STAIN: Culture: NORMAL

## 2019-11-30 NOTE — Telephone Encounter (Signed)
Routing to [facility] since she sent rx, thanks

## 2019-11-30 NOTE — Telephone Encounter (Signed)
Returned call to [employer].  Spoke with pharmacist Loraine Leriche asking to verify Esbriet prescription.  He states they received a fax for the maintenance dose of Esbriet.    Advised that was incorrect.  An electronic prescription was sent to the pharmacy on 11/13/2019 for Esbriet starter dose.  He was able to locate the prescription.  Informed pharmacist that our office was asked to fax [employer] prescription form in order for the prescription to be filled.  Advised that is not required with any other specialty pharmacy to use and this delayed the patient starting therapy for over 3 weeks.    Pharmacist states he believes it has something to do with ordering but he is not involved in that process.  He attempted to transfer up to a representative that would be able to answer my questions but not available.  He took down a call back number and someone will be returning our call to discuss.  They are going to assess the patient's prescription now.  We will follow-up with patient and pharmacy on Monday.  Verlin Fester, PharmD, [place], CPP Clinical Specialty Pharmacist [phone]  11/30/2019 2:23 PM

## 2019-11-30 NOTE — Telephone Encounter (Signed)
Called and spoke with pt letting her know the info stated by MR. Pt verbalized understanding and stated she would call if wanted a sooner appt with APP. Nothing further needed.

## 2019-12-03 NOTE — Telephone Encounter (Signed)
Kimberly Shelton states Esbriet need to be verified. Kimberly phone number is [phone] option 2.

## 2019-12-03 NOTE — Telephone Encounter (Signed)
Returned call to [employer], rep Olegario Messier states they needed a verbal for the starter dosing, advised that it was escribed on 11/13/19. She was able to see it on file.  Nothing further is needed.

## 2019-12-03 NOTE — Telephone Encounter (Signed)
I discussed with patient on Thursday. Called [employer], they are reaching out to [place] to obtain patient's Hub ID to allow for shipment. Once received, they will call patient to schedule shipment. Will follow up.

## 2019-12-04 ENCOUNTER — Telehealth: Payer: Self-pay | Admitting: Internal Medicine

## 2019-12-04 NOTE — Telephone Encounter (Signed)
  Preliminary review of the lab results indicate hemoptysis because of all the destruction that has happened in the lung and the fungal ball -she will always continue to have hemoptysis.  As long as it is baseline there is nothing much to do other than cough suppression and continued voriconazole under the instructions of Dr. Staci Righter and infectious diseases  Plan  = Please make a televisit/video visit with the nurse practitioner in the next week to help with cough suppression and review of results of her from the bronchoscopy [I am on PALl]  - She has upcoming appointment with Dr. Staci Righter on December 10, 2019.  She should keep that. - She has upcoming visit with me January 01, 2020 -she should keep that

## 2019-12-04 NOTE — Telephone Encounter (Signed)
Spoke with Kimberly Shelton, she states she had her bronch on 11/27/2019. She coughed up blood today, pinkish in color but denies pain. She states she has been coughing up blood for 6 months and she wants to speak to Dr. [name] about this and would like a call from him directly. Dr. [name] please call Kimberly Shelton.

## 2019-12-05 NOTE — Telephone Encounter (Signed)
Called and spoke to pt. Informed her of the recs per MR. Appt made with Buelah Manis, NP, for 4/5. Pt verbalized understanding and denied any further questions or concerns at this time.

## 2019-12-10 ENCOUNTER — Encounter: Payer: Self-pay | Admitting: Primary Care

## 2019-12-10 ENCOUNTER — Telehealth (INDEPENDENT_AMBULATORY_CARE_PROVIDER_SITE_OTHER): Payer: 59 | Admitting: Primary Care

## 2019-12-10 ENCOUNTER — Encounter: Payer: Self-pay | Admitting: Internal Medicine

## 2019-12-10 ENCOUNTER — Ambulatory Visit (INDEPENDENT_AMBULATORY_CARE_PROVIDER_SITE_OTHER): Payer: 59 | Admitting: Internal Medicine

## 2019-12-10 ENCOUNTER — Other Ambulatory Visit: Payer: Self-pay

## 2019-12-10 VITALS — BP 130/74 | HR 70 | Temp 98.0°F | Ht 64.0 in | Wt 147.0 lb

## 2019-12-10 DIAGNOSIS — R042 Hemoptysis: Secondary | ICD-10-CM | POA: Diagnosis not present

## 2019-12-10 DIAGNOSIS — Z7952 Long term (current) use of systemic steroids: Secondary | ICD-10-CM

## 2019-12-10 DIAGNOSIS — Z5181 Encounter for therapeutic drug level monitoring: Secondary | ICD-10-CM | POA: Diagnosis not present

## 2019-12-10 DIAGNOSIS — B449 Aspergillosis, unspecified: Secondary | ICD-10-CM

## 2019-12-10 DIAGNOSIS — Z87891 Personal history of nicotine dependence: Secondary | ICD-10-CM

## 2019-12-10 DIAGNOSIS — J849 Interstitial pulmonary disease, unspecified: Secondary | ICD-10-CM

## 2019-12-10 DIAGNOSIS — J984 Other disorders of lung: Secondary | ICD-10-CM

## 2019-12-10 LAB — CBC WITH DIFFERENTIAL/PLATELET
Absolute Monocytes: 699 cells/uL (ref 200–950)
Basophils Absolute: 101 cells/uL (ref 0–200)
Basophils Relative: 1.1 %
Eosinophils Absolute: 221 cells/uL (ref 15–500)
Eosinophils Relative: 2.4 %
HCT: 35.3 % (ref 35.0–45.0)
Hemoglobin: 11.4 g/dL — ABNORMAL LOW (ref 11.7–15.5)
Lymphs Abs: 1058 cells/uL (ref 850–3900)
MCH: 26.9 pg — ABNORMAL LOW (ref 27.0–33.0)
MCHC: 32.3 g/dL (ref 32.0–36.0)
MCV: 83.3 fL (ref 80.0–100.0)
MPV: 9 fL (ref 7.5–12.5)
Monocytes Relative: 7.6 %
Neutro Abs: 7121 cells/uL (ref 1500–7800)
Neutrophils Relative %: 77.4 %
Platelets: 398 10*3/uL (ref 140–400)
RBC: 4.24 10*6/uL (ref 3.80–5.10)
RDW: 13.9 % (ref 11.0–15.0)
Total Lymphocyte: 11.5 %
WBC: 9.2 10*3/uL (ref 3.8–10.8)

## 2019-12-10 LAB — COMPLETE METABOLIC PANEL WITH GFR
AG Ratio: 1 (calc) (ref 1.0–2.5)
ALT: 26 U/L (ref 6–29)
AST: 45 U/L — ABNORMAL HIGH (ref 10–35)
Albumin: 3.6 g/dL (ref 3.6–5.1)
Alkaline phosphatase (APISO): 381 U/L — ABNORMAL HIGH (ref 37–153)
BUN: 10 mg/dL (ref 7–25)
CO2: 29 mmol/L (ref 20–32)
Calcium: 9.1 mg/dL (ref 8.6–10.4)
Chloride: 102 mmol/L (ref 98–110)
Creat: 0.67 mg/dL (ref 0.50–0.99)
GFR, Est African American: 111 mL/min/{1.73_m2} (ref >=60)
GFR, Est Non African American: 96 mL/min/{1.73_m2} (ref >=60)
Globulin: 3.6 g/dL (calc) (ref 1.9–3.7)
Glucose, Bld: 100 mg/dL — ABNORMAL HIGH (ref 65–99)
Potassium: 4.6 mmol/L (ref 3.5–5.3)
Sodium: 138 mmol/L (ref 135–146)
Total Bilirubin: 0.3 mg/dL (ref 0.2–1.2)
Total Protein: 7.2 g/dL (ref 6.1–8.1)

## 2019-12-10 MED ORDER — BENZONATATE 200 MG PO CAPS: ORAL_CAPSULE | ORAL | 3 refills | Status: DC

## 2019-12-10 NOTE — Assessment & Plan Note (Signed)
She continues to have cavitary lesions so I will have her continue with voriconazole for a projected 6 months.  No growth on the BAL cultures so may be a good sign.  rtc 6 weeks

## 2019-12-10 NOTE — Progress Notes (Signed)
Virtual Visit via Telephone Note  I connected with Kimberly Shelton on 12/10/19 at  2:00 PM EDT by telephone and verified that I am speaking with the correct person using two identifiers.  Location: Patient: Home Provider: Home   I discussed the limitations, risks, security and privacy concerns of performing an evaluation and management service by telephone and the availability of in person appointments. I also discussed with the patient that there may be a patient responsible charge related to this service. The patient expressed understanding and agreed to proceed.   History of Present Illness: 59 year old female, former smoker. PMH significant for bronchiectasis, rheumatoid arthritis, cavitary lung disease s/p pneumococcal pneumonia, aspergilloma RUL, covid-19, CHF, aortic atherosclerosis. Patient of Dr. Chase Caller, last seen on 10/29/19. Following with Dr. Linus Salmons with infectious disease. Maintained on Plaquinel 222m BID, voriconazole 2060mBID, prednisone 40m22maily, tessalon perles, prn albuterol hfa. Started on antifibrotic in February 2020. Needs spiroetry with DLCO in April.   CT scan of the chest: Radiologist calling for presence of UIP/ILD with progression.  In addition she has a fibrocavitary fibronodular changes with aspergilloma.  She also has coronary artery calcification  12/10/2019 Patient contacted today for regular follow-up/televisit. She saw Dr. ComLinus Salmonsth Infection disease today. Cavitary lesion stable, she will continue antifungal for another 6 months. BAL with no growth. She has received Esbriet medication, specialty pharmacy told her it will be delivered on Wednesday. She only has hemoptysis in the morning. Tessalon perles help a lot with this. Saw ENT and reports normal evaluation. She had her first covid vaccine. Second vaccine is scheduled April 17th.   Observations/Objective:  - Able to speak in full sentences; no significant shortness of breath, wheezing or  cough  Assessment and Plan:  ILD: - Progressive changes consistent with UIP on CT chest - Started on antifibrotic medication in February 2020, changed to Esb[place]bor[place] awaiting medication delivery form specialty pharmacy this Wednesday 12/12/19 - Upcoming visit with Dr. RamChase Caller April 27th with Spirometry/DLCO   Cavitary lung disease/ Aspergilloma RUL - BAL with no growth - Continue antifungal 6 months per Dr. ComLinus Salmonsemoptysis - Stable; morning hemoptysis only  - It is to be expected that patient will have some degree of hemoptysis d.t cavitary lung disease at all times  - Tessalon perles TID for cough suppression (Rx refilled)  Follow Up Instructions:  -Scheduled fo PFTs and follow-up with Dr. RamGuadalupe Dawnd of April   I discussed the assessment and treatment plan with the patient. The patient was provided an opportunity to ask questions and all were answered. The patient agreed with the plan and demonstrated an understanding of the instructions.   The patient was advised to call back or seek an in-person evaluation if the symptoms worsen or if the condition fails to improve as anticipated.  I provided 18 minutes of non-face-to-face time during this encounter.   EliMartyn EhrichP

## 2019-12-10 NOTE — Patient Instructions (Signed)
Refill tessalon perles sent # 90 tabs Start Esbriet as prescribed when you receive from specialty pharmacy (1 tab three times a day x 1 week; 2 tabs three times a day x 1 week then 3 tabs three times a day) Follow with spirometry/ Dr. [name] end of April

## 2019-12-10 NOTE — Assessment & Plan Note (Signed)
No new concerns.  Will check her Hgb to be sure it is not dropping much

## 2019-12-10 NOTE — Assessment & Plan Note (Signed)
Will check her labs today to be sure no issues.

## 2019-12-10 NOTE — Progress Notes (Signed)
Patient ID: Kimberly Shelton, female   DOB: 1961-02-12, 59 y.o.   MRN: [id number]   Subjective:    Patient ID: Kimberly Shelton, female    DOB: 10/10/1960, 59 y.o.   MRN: [id number]  HPI Here for follow up for pulmonary cavitary lesions.  I saw her last year and noted bronchiectasis and had a positive aspergillus Ig positive with negative Ag and no growth on sputum culture.  She had been relatively asymptomatic.  She more recently though has developed hemoptysis and a follow up CT scan with cavitary areas.  I put her on voriconazole and she took this for 3 months.  A repeat CT scan showed some improvement.    I did a televisit with her 11/25 and she noted some blood-tinged sputum.  Also talked to [name], NP at the pulmonary clinic.  Since then, it has mainly been light pink sputum, no overt blood.  No worsening symptoms. She completed prolonged voriconazole but returned to Dr. Chase Caller and had some progression of symptoms and follow up CT with mild progression.   I started her back on voriconazole and she is tolerating it well.   She had a BAL done last month and negative cultures to date.  She is to start on Esbriet.  No fever.  Still some blood tinged sputum in the am.  Exercising more now on a stationary bike.  No new complaints.    Review of Systems  Constitutional: Negative for chills and fever.  Respiratory: Negative for cough, shortness of breath, wheezing and stridor.   Skin: Negative for rash.       Objective:   Physical Exam Constitutional:      Appearance: Normal appearance.  Eyes:     General: No scleral icterus. Pulmonary:     Effort: Pulmonary effort is normal. No respiratory distress.  Skin:    Findings: No rash.  Neurological:     Mental Status: She is alert.  Psychiatric:        Mood and Affect: Mood normal.    SH: not vaping       Assessment & Plan:

## 2019-12-11 ENCOUNTER — Ambulatory Visit: Payer: 59

## 2019-12-11 ENCOUNTER — Telehealth: Payer: Self-pay | Admitting: Internal Medicine

## 2019-12-11 MED ORDER — HYDROCODONE-HOMATROPINE 5-1.5 MG/5ML PO SYRP: 5.0000 mL | ORAL_SOLUTION | Freq: Four times a day (QID) | ORAL | 0 refills | Status: DC | PRN

## 2019-12-11 NOTE — Telephone Encounter (Signed)
Will send to [facility] NP as she had visit with her yesterday

## 2019-12-11 NOTE — Telephone Encounter (Signed)
Spoke with pt. States that she has been coughing up blood since 0200. Reports that this has happened twice since then. Denies fever/chills, congestion/runny nose, sore throat, severe headache, joint pain, unexplained muscle aches, increased shortness of breath, loss of taste or smell, rash, N/V/D, abdominal pain, redness around/in the eye. Pt describes what she is coughing up as "straight blood, there is no mucus mixed in." We do not have any available appointments today to offer the pt.  Tammy - please advise. Thanks!

## 2019-12-11 NOTE — Telephone Encounter (Signed)
Some degree of hemoptysis is normal.Recommend cough suppression. Will send in prescription for hycodan she can use at night as needed. Continue to monitor if continues to cough up straight blood throughout the day >1 cup please let office know. Sent in [employer]

## 2019-12-26 LAB — FUNGAL ORGANISM REFLEX

## 2019-12-26 LAB — FUNGUS CULTURE RESULT

## 2019-12-26 LAB — FUNGUS CULTURE WITH STAIN

## 2020-01-01 ENCOUNTER — Other Ambulatory Visit: Payer: Self-pay

## 2020-01-01 ENCOUNTER — Encounter: Payer: Self-pay | Admitting: Internal Medicine

## 2020-01-01 ENCOUNTER — Other Ambulatory Visit (INDEPENDENT_AMBULATORY_CARE_PROVIDER_SITE_OTHER): Payer: 59

## 2020-01-01 ENCOUNTER — Ambulatory Visit: Payer: 59 | Admitting: Internal Medicine

## 2020-01-01 ENCOUNTER — Other Ambulatory Visit (HOSPITAL_COMMUNITY): Payer: 59

## 2020-01-01 VITALS — BP 118/60 | HR 80 | Temp 97.6°F | Ht 64.0 in | Wt 146.0 lb

## 2020-01-01 DIAGNOSIS — Z5181 Encounter for therapeutic drug level monitoring: Secondary | ICD-10-CM

## 2020-01-01 DIAGNOSIS — J849 Interstitial pulmonary disease, unspecified: Secondary | ICD-10-CM

## 2020-01-01 LAB — HEPATIC FUNCTION PANEL
ALT: 11 U/L (ref 0–35)
AST: 17 U/L (ref 0–37)
Albumin: 3.8 g/dL (ref 3.5–5.2)
Alkaline Phosphatase: 218 U/L — ABNORMAL HIGH (ref 39–117)
Bilirubin, Direct: 0 mg/dL (ref 0.0–0.3)
Total Bilirubin: 0.3 mg/dL (ref 0.2–1.2)
Total Protein: 7.7 g/dL (ref 6.0–8.3)

## 2020-01-01 NOTE — Patient Instructions (Addendum)
Hemoptysis  - mild and ongoing and due to below lung issues but maybe better after voricanazole restart on 10/26/2019 - completed bronch 11/27/19 - no new culture results  Plan  - no specific intervention -0t his is your baseline  - any massive or major coughing up blood - go to ER   Pulmonary aspergilloma (HCC) Bronchiectasis without complication (HCC)  -noted back on voricanazole  Plan  - per Dr Luciana Axe - continue voricanazole   Interstitial lung disease due to connective tissue disease (HCC) - UIP pattern, progresive  - problems tolerating week 2 of esbriet - could be due to concomitant covid shot  Plan - check LFT 01/01/2020 - continue esbriet 1 tab three times daily this week   - On 01/07/20 Monday go to 2 tablets three times daily and stay at this dose for 2 weeks    - On 01/21/20 - Monday go to 3 tab three times daily   - Take OTC ginger capsule if you feel nausea  - always take with food and space esbriet 6h apar    Rheumatoid arthritis involving multiple joints (HCC)  -This appears active given the onset of pulmonary nodules and new elbows and also interstitial lung disease in the lung and active joint pain  - I reviewed dR Beekman recent notes 12/27/19  Plan -plaquenil oer Dr Dierdre Forth -Keep your prednisone at 5 mg/day -Avoid biologic treatment for your rheumatoid arthritis but probably okay to start leflunomide  Interatrial cardiac shunt-this appears new on the echocardiogram January 2021 Cardiac murmur -old Coronary artery calcification-old  Plan - per  Dr. Verne Carrow her cardiologist -  Follow-up - check LFT 01/01/2020' - cancel PFT 01/01/2020 but change it to 6 weeks - Amber Yopp for esbriet in 7-14 days -30-minute slot in 6 weeks with Dr [name] - ILD clinic or any day - but after PFT

## 2020-01-01 NOTE — Progress Notes (Signed)
OV 03/04/2014 Chief Complaint  Patient presents with  . Follow-up    HFU-D/c from [facility]. Pt states she has chest soreness from coughing. Pt states with the O2 her breathing has improved. C/o dyspnea when working with PT and cough with yellow mucous.    Admitted 02/16/2014 through 02/24/2014 with right-sided pneumococcal pneumonia with cavitation in the setting of smoking and rheumatoid arthritis on immune modulators. At this point in time she is off the immunomodulators. Dr. Amil Amen her rheumatologist is aware of this. She is 25% back to her baseline but still very fatigued and short of breath. She coughs a lot with home physical therapy. She brings up green mucus and sputum with her cough needed overall cough is significantly improved since discharge. Appetite is slowly returning. She works as a Art gallery manager at [employer] and does not want to go to work till she is fully off oxygen. She is requesting to stay off work for several weeks. On 2 L there is documentation that when she walks 2 minutes up saturation still stays at 95%.   Today in the office pon room air at rest t it is 93%. Walked 40 feet and desaturated to 89% but HR 150/min per tech: I think this is all deconditioning but not sure if she has cardiomyopathy from her illness   04/02/14 Follow up  Pt returns for follow up from recent sever critical illness from streptococcal PNA , Bacteremia, sepsis 02/2014.   She had a right-sided pneumococcal pneumonia with cavitation in the setting of smoking and rheumatoid arthritis on immune modulators Today PFT show FEV1 61% , ratio 87  , FVC 55 %  , NO sign BD response, Diffusing capacity  Decreased 57% Has not smoked.  Wants to return to work soon.  CXR shows persistent RUL/LLL cavitary lesions .  Says she is feeling so much better.  No fever, chest pain, orthopnea, edema or n/v/d.  Good appetite . Seen by cards for tachycardia felt secondary to recent PN   OV 05/01/2014  Chief  Complaint  Patient presents with  . Follow-up    Pt states her breathing has improved since last OV. Pt c/o prod cough with yellow and clear mucous and lower mid lumbar pain, pt thinks d/t RA. Pt doing duoneb only once a day.  Pt denies SOB.     Admitted 02/16/2014 through 02/24/2014 with right-sided pneumococcal pneumonia with cavitation in the setting of smoking and rheumatoid arthritis on immune modulators. Current followup is for the same   S: Presents with husband. Feels great overall. No fatigute. No dyspnea. Able to do all ADLs. Wants to return to work now and is asking for release to work. In terms of RA, she feels early morning stiffness of hand joint is returning. SHe is seeing Dr Amil Amen 05/01/14 and will discuss with him about return to immunomodulators. There are no other issues. CXR done after she left and at time of this note:    Dg Chest 2 View  04/30/2014   CLINICAL DATA:  Followup cavitary pneumonia  EXAM: CHEST  2 VIEW  COMPARISON:  04/02/2014  FINDINGS: Normal heart size, mediastinal contours, and pulmonary vascularity.  Patchy infiltrates are identified in RIGHT upper lobe and LEFT lower lobe consistent with pneumonia.  Cavitary foci identified previously in the in both the RIGHT upper and LEFT lower lobes are still seen, the less evident in the RIGHT upper lobe.  Scattered interstitial prominence again identified.  No gross pleural  effusion or pneumothorax.  Bones unremarkable.  IMPRESSION: Persistent infiltrates are identified in the RIGHT upper lobe and LEFT lower lobe with persistent visualization of cavitary foci.   Electronically Signed   By: Lavonia Dana M.D.   On: 04/30/2014 19:09      OV 08/09/2014  Chief Complaint  Patient presents with  . Follow-up    Pt here after CT scan. Pt denies change in breathing since last OV. Pt c/o prod cough with clear and green mucus.     Presents for Pneumococcal pneumonia followup that developed in setting of RA and Simponi  She  is being maintained on ARAVA since her pneumococcal pneumonia earlier in 2013.  Now she is free of symptoms of respiratory tree. CT 08/08/14 shows further improvement but has architectural distortion. HEr RA is now significantly  active - per her and my talking 08/09/2014 with Dr Amil Amen. Patient is very apprehensive of starting ORENCIA recommended by Dr Amil Amen due to increased aecopd risk (She does not have copd), recurrent infection esp in setting of focal archiectrual distortion and recent pneumonia. She is very scared of dealing with RA disability v infection risk   Social and past and family: Mom had RA. She is upset abotu her disease  CT 08/08/14  IMPRESSION: 1. Emphysema with scattered scarring, volume loss, scattered bulla (some of which are still inflamed and with a somewhat cavitary appearance), and bilateral airway thickening. Much of this represents residua from prior multi lobar cavitary pneumonia, and is improved compared to the prior July CT scan. Given the degree of architectural distortion, scattered nodularity and opacities, and and irregular scarring, CT is not able to clear the lung of the possibility of malignancy, and surveillance imaging may be warranted.   Electronically Signed  By: Sherryl Barters M.D.  On: 08/08/2014 15:32  OV 11/06/2014  Chief Complaint  Patient presents with  . Follow-up    Coughing up whitish colored phlegm, SOB with walking, not always, sometimes.      Presents for Pneumococcal pneumonia followup that developed in setting of RA and Simponi June 2015, with resultant architectural distortion in her lung anatomy  Last seen December 2015. Since then overall she's doing well. At baseline she only has mild cough with occasional white sputum. She also has exertional dyspnea when she climbs up a hill but clearly in cold air he did this is relieved by rest. She is currently on 2 immunomodulators for her rheumatoid arthritis Orencia and Arava and  this is helping her joints. She's not had any respiratory complications so far. I notice that she is not on Bactrim prophylaxis but she is concerned about the side effects. And she wants to talk to her rheumatologist Dr. Amil Amen about this  Past, Family, Social reviewed: no change since last visit   OV 02/10/2015  Chief Complaint  Patient presents with  . Follow-up    Pt stated her breathing feels it has improved since last OV. Pt here after PFT. Pt stated she has a little more trouble breathing when it is hot out. Pt c/o mild prod cough with clear and small light green mucus. Pt denies CP/tightness.    Follow-up chronic destructive pulmonary to changes following pneumococcal pneumonia in 2015 in the setting of previous heavy smoking and immunomodulatory treatment for rheumatoid arthritis   Since seeing me last in March 2016 she feels that the albuterol inhaler that I tried for dyspnea and exertion and wheezing has helped. Dyspnea still persists. It is unchanged. It is  mild. However it is improved with albuterol which she takes prior to exertion. There is no associated cough or. Wheezing is improved. No hemoptysis no fever no chills .Outside chart from Dr. Amil Amen her rheumatologist dated 02/04/2015 reviewed. She reported to him that she is compliant on Ornecia and arava with good relief and joint pain. Her dyspnea is mild chronic and stable. His advice for her to continue the same. He suggested that I look into her chronic residual dyspnea at this visit   Last pulmonary imaging with CT scan of the chest December 2015:residual changes + along with emphysema; report reviewed. Image not visualized  Pulmonary function test 02/10/2015 today personally reviewed image: FEV1 prebronchodilator 1.7 L/64%, ratio 69.  FEV1 postbronchodilator is 1.9 L/72% and is a 12% positive response with broncho-dilator., FVC 2.4 L/72% and ratio of 79. Total lung capacity is 3.76 L/76%. DLCO is 18.5/80%. PFTs are consistent  with restriction and a normal DLCO but in the prebronchodilator she has mild obstruction   OV 08/11/2015  Chief Complaint  Patient presents with  . Follow-up    Pt states her breathing has improved since last OV. Pt states she has only been using the spiriva as needed. Pt c/o prod cough with clear mucus and DOE with heavy exertion. pt denies CP/tightness.     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 She continues to do well. She is now on leflunomide and Orencia for her rheumatoid arthritis. With this rheumatoid arthritis symptoms have improved. She is follows up with Dr. Amil Amen in his new rheumatology practice [facility]. She feels that her wheezing has significantly improved. Shortness of breath significantly improved. She is not taking Spiriva anymore. She reported some hoarseness with it. She wants to continue to watch herself without Spiriva. She is up-to-date with her flu shot.   OV 02/09/2016  Chief Complaint  Patient presents with  . Follow-up    Pt reports that her breathing is doing well. Pt c/o occasional dry cough that is worse when using a vape for recreational use. Pt also c/o occasional wheeze with hot, humid weather and exertion. Pt does use albuterol HFA and her symptoms improve. Pt denies SOB/CP/tightness.        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 Doing well but has started smoking again though is nicotine-free vape smoking now. She did not want spiriva due to dpi and throat irritation. But she is using albuterol atleast daily. She is having exertional wheeze but otherwise okayt. Maureen Chatters  continues with Dr Amil Amen. She tells me she never had shingles vaccine. Otherwise well. Working at [employer] . Does not want cxr 02/09/2016 for fu. Wants to defer till next visit   OV 12/08/2017  Chief Complaint  Patient presents with  . Follow-up    Last seen 02/09/16.  Pt states she has been doing well since last visit and denies any complaints.      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  She went through a separation and divorce and this time.  Smoking continues  to be in remission.  At last visit she was smoking but now is in remission.  She continues to work at the [employer].  For her rheumatoid arthritis she has been switched out of orcencia to [place] and this is working well for her.  She only has some mild pain.  In terms of her respiratory symptoms she hardly has any symptoms.  Definitely no cough.  She has mild dyspnea on exertion when working out in the yard but this is stable.  Review of the chart shows that her last echocardiogram was in 2015.  Her last chest x-ray or imaging was in 2016 and so was a pulmonary function test.  She wants to make sure her lungs are doing okay at this point.   OV 01/27/2018  Chief Complaint  Patient presents with  . Follow-up    Breathing is improved since last OV. Still has some coughing in the mornings. Denies chest tightness, wheezing.   Lawrence Roldan presents for follow-up after testing.  In the interim she continues to be stable.  She only has mild dyspnea on exertion when she does gardening.  For this she uses albuterol as needed she does not want to upscale on her inhalers.  Testing showed atrial septal aneurysm and coronary artery calcification.  Cardiology has reassured her on both accounts.  In terms of the lung function that seems to be  a decline in FEV1 compared to 2016 but very similar to 2015.  DLCO is stable all along.  She feels stable.  CT scan of the chest shows improved bronchiectasis compared to few years ago.  The only issue is that in the right upper lobe that seems to be a cavity with a potential aspergilloma although this is totally uncertain at this stage.  She does not have any hemoptysis or any symptoms.  She is immunocompromised on rheumatoid arthritis medication Orencia at this point.  Apparently the Morrie Sheldon was stopped.   Results for ZYKERA, ABELLA (MRN [id number]) as of 01/27/2018 10:48  Ref. Range 04/02/2014 11:42 02/10/2015 13:49 01/27/2018 09:57  FEV1-Post Latest Units: L 1.68 1.89 1.63  FEV1-%Pred-Post Latest Units: % 64 73 64  FEV1-%Change-Post Latest Units: % _0 Results for BRENDIA, DAMPIER (MRN [id number]) as of 01/27/2018 10:48  Ref. Range 04/02/2014 11:42 02/10/2015 13:49 01/27/2018 09:57  DLCO unc Latest Units: ml/min/mmHg 13.15 18.50 18.02  DLCO unc % pred Latest Units: % 57 80 78    IMPRESSION: CT chest 1. Widespread areas of bronchiectasis and scarring, generally similar to the prior examination. Two of the thick-walled cavitary areas in the right upper lobe seen on the prior study have resolved, however, there is a new area of scarring in the right upper lobe near the apex which contains some internal soft tissue which is favored to represent some sloughed necrotic lung tissue, although an aspergilloma is not entirely excluded. Close attention on future follow-up imaging is recommended. 2. Possible honeycombing in the extreme lung bases which could indicate early interstitial lung disease. Future follow-up examination should be performed is high-resolution chest CTs to better evaluate these findings. 3. Cholelithiasis. 4. Aortic atherosclerosis, in addition to 2 vessel coronary artery disease. Please note that although the presence of coronary artery calcium documents the presence  of coronary artery disease, the severity of this disease and any potential stenosis cannot be assessed on this non-gated CT examination. Assessment for potential risk factor modification, dietary therapy or pharmacologic therapy may be warranted, if clinically indicated.  Aortic Atherosclerosis (ICD10-I70.0).   Electronically Signed   By: Vinnie Langton M.D.   On: 12/21/2017 13:00   ECHO 12/12/17 Study Conclusions  - Left ventricle: The cavity size was normal. Wall thickness was   normal. Systolic function was vigorous. The estimated ejection   fraction was in the range of 65% to 70%. Wall motion was normal;   there were no regional wall motion abnormalities. Doppler   parameters are consistent with abnormal left ventricular   relaxation (grade 1 diastolic dysfunction). - Atrial septum: There was an atrial septal aneurysm.  Impressions:  - Vigorous LV systolic function, mild diastolic dysfunction.  OV 08/11/2018  Subjective:  Patient ID: Jonne Ply, female , DOB: 05-01-1961 , age 35 y.o. , MRN: [id number] , ADDRESS: [place] [id number]   08/11/2018 -   Chief Complaint  Patient presents with  . Follow-up    f/u bronchiectasis, PFT today, pt doing well , only cough in tha am with little mucus     HPI SHANEEN REESER 59 y.o. -presents for follow-up of bronchiectasis suffered in 2015 following really severe pneumococcal pneumonia in the setting of immunosuppression due to rheumatoid arthritis.  This routine follow-up.  When I saw her in the spring 2019 we did a CT scan of the chest and there was suggestion of possible aspergillus cavity.  Did Aspergillus antibody and she was positive for IgG.  Therefore referred her to infectious diseases.  I reviewed the note.  Given overall stability expectant approach has been adopted.  At this point in time she feels stable using albuterol as needed.  Vaccine records indicate she could benefit from both  shingles vaccine and Prevnar.  She seems to be under the assumption that shingles vaccine is only after age 82.  I reviewed the literature and it is actually indicated for people over 50.  However she should only get the new Shingrix vaccine which is inactivated.  Prevnar is also indicated for her given her previous pneumococcal pneumonia and her immunosuppressed status.  Coronary artery calcification on CT scan: This was seen in April 2019.  Since then she did see cardiology.  They are aware of this and the place her on follow-up.  She does not have any chest pain currently.    OV 09/13/2019  Subjective:  Patient ID: Jonne Ply, female , DOB: 01-13-1961 , age 57 y.o. , MRN: [id number] , ADDRESS: [place] [id number]   09/13/2019 -   Chief Complaint  Patient presents with  . Follow-up    Pt states she has been spitting up bloody mucus every morning and states her throat is also raw feeling. Pt was diagnosed with covid 11/19 and did okay with that other than losing her sense of taste and smell.   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 Gerri Spore  HPI Jonne Ply 59 y.o. -returns for follow-up.  Last seen in December 2019.  After that in the summer 2020 CT scan of the chest showed progression.  I referred her to Dr. Scharlene Gloss who placed her on voriconazole.  She is currently finished her treatment with voriconazole but she tells me for the last 6 months she continues to have early morning mild hemoptysis versus pink sputum.  She says  that despite the voriconazole this persist.  In early November 2020/mid November 2020 she picked up COVID-19 while singing in a church along with her sister who also had COVID-19.  One of the  person died in that cluster.  That happens to be my patient.  She tells me since the COVID-19 for which she did not get hospitalized she has had slight increase in cough and slight increase in hemoptysis and ongoing fatigue.  For her rheumatoid arthritis she is only on prednisone she is unable to take any immunomodulators because of her pulmonary issues.  The disease has gotten progressive with the onset of rheumatoid nodules in her bilateral elbows.  She has gained weight in the pandemic because of being sedentary.   Review of the chart indicates last echo was in 2019.  She has a murmur.  Her last CT scan of the chest was in August 2020.   She wants to know if it is okay to go back on immunosuppressive's.- d/w Dr Amil Amen -  DMARD prob ok but not biologics   OV 10/11/2019  Subjective:  Patient ID: Jonne Ply, female , DOB: [birth year] , age 22 y.o. , MRN: [id number] , ADDRESS: [place] [address]   10/11/2019 -   Chief Complaint  Patient presents with  . 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 Gerri Spore  #small hiatl hernia  #coronary artery calcification and baseline heart murmur - dr Angelena Form   HPI MATA ROWEN 59 y.o. -presents to discuss the test results in the setting of above medical issues.  She again tells me that she started having hemoptysis 6 months ago or so.  She was then given voriconazole.  She says with that the pink slight hemoptysis did not resolve.  Then she finished an adequate course of voriconazole but the light streaky pink hemoptysis is continued.  Then several days ago she had like one dark clot.  She is now given that sputum for  AFB cultures are pending smear is negative.  Otherwise new problem is she is constipated for 3 days.  She is also taking her prednisone at 5 mg/day because it is causing increased weight gain.  Her arthralgia is worse.  She plans to see rheumatology in a week or 2.  New issue is constipation for the last 3 days.  However in review of the chart she has had constipation in the past but she is expressing it to me for the first time.  She also wants treatment for this.  In terms of CT scan of the chest: Radiologist calling for presence of UIP/ILD with progression.  In addition she has a fibrocavitary fibronodular changes with aspergilloma.  She also has coronary artery calcification  Echocardiogram shows left ventricular hypertrophy with normal ejection fraction but there is a description of left to right interatrial shunt.  I think this is a new finding.  IMPRESSION Jan 2021 1. Spectrum of findings at the lung bases worrisome for progressive fibrotic interstitial lung disease with mild-to-moderate honeycombing, clearly progressive compared to 2015 and 2019 chest CT studies, with more mild progression since 05/02/2019 chest CT. Findings are consistent with UIP per consensus guidelines: Diagnosis of Idiopathic Pulmonary Fibrosis: An Official  ATS/ERS/JRS/ALAT Clinical Practice Guideline. [place], Iss 5, [phone], May 07 2017. 2. Extensive chronic fibrocavitary and fibronodular changes in the right greater than left lung apices with evidence of mycetomas in the large irregular thick walled cavitary region in the apical right lung, which has mildly increased in size. 3. One vessel coronary atherosclerosis. 4. Stable mild right paratracheal lymphadenopathy, most compatible with benign reactive etiology. 5. Small hiatal hernia. 6. Cholelithiasis.  Aortic Atherosclerosis (ICD10-I70.0) and Emphysema (ICD10-J43.9).   Electronically Signed   By: Ilona Sorrel M.D.   On:  09/26/2019 16:43  ECHO Jan 2021  -- LVH ef 65% ROS - per HPI   OV 10/29/2019  Subjective:  Patient ID: Jonne Ply, female , DOB: Jan 12, 1961 , age 42 y.o. , MRN: [id number] , ADDRESS: [place] [id number]   10/29/2019 -   Chief Complaint  Patient presents with  . Follow-up    Pt states she has been doing okay since last visit. States she is still occ coughing up blood. Pt states that she is sore/achey in the mornings prior to getting up and will take aleve to help with that.   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 Linus Salmons  #ILD/UIP  - progressive UIP pattern - on HRCt April 2019 -> 2021 Jan  #small hiatl hernia  #coronary artery calcification and baseline heart murmur - dr Angelena Form  #RA  - Dr Amil Amen HPI Jonne Ply 59 y.o. -returns for follow-up.  Last seen in early in February 2021.  Several concerns at that time.  In the interim I presented her case at the multidisciplinary case conference.  The determination was that she had early ILD changes even in April 2019 and is now progressed to a definite UIP pattern.  This is a new finding for [place].  Concern of the conference is that she has rheumatoid arthritis ILD/UIP pattern.  And this is progressive.  There for this visit is to discuss specifically the UIP issues but also round of the other issues.  Again with this diagnosis.  Also discussed the potential for participation in a research registry.  She is interested.  We also discussed antifibrotic therapy nintedanib.  Discussed contraindications which she does not seem to have.  She does not have colitis.  Infectious constipation.  She does not have any  bleeding diathesis.  Not on blood thinners.  She is on voriconazole and Plaquenil and now with nintedanib she will require intensive liver function test monitoring.  She is open for this.  She understands this drug is to help prevent fibrosis getting worse but will not manage her symptoms.  In terms of her ongoing hemoptysis with fibrocavitary bronchiectasis and aspergilloma: She revisited with Dr. Gerri Spore.  I reviewed the note.  Patient has been restarted on voriconazole 3 days ago on October 26, 2019.  After this the hemoptysis might be slightly better.  Multidisciplinary case conference consideration is for bronchoscopy with lavage but again this has been disrupted because of the COVID-19 pandemic.  She thinks hemoptysis is slightly better but is still present.   In terms of rheumatoid arthritis: She visited with Dr. Amil Amen.  I reviewed the note.  She has  been asked to continue with the prednisone 5 mg/day.  Plaquenil was been added because of her pain.  She is extremely upset about her rheumatoid arthritis nodules in the bilateral elbow region.  Also with ongoing pain.  In terms of coronary artery calcification based on heart murmur: I referred her to Dr. Angelena Form.  Was rereferral.  I do not see an appointment made.        OV 01/01/2020  Subjective:  Patient ID: Jonne Ply, female , DOB: 1961/01/16 , age 89 y.o. 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 Linus Salmons  #ILD/UIP  - progressive UIP pattern - on HRCt  April 2019 -> 2021 Jan  - started esbriet approx 12/10/19  #small hiatl hernia  #coronary artery calcification and baseline heart murmur - dr Angelena Form  #RA  - Dr Amil Amen   - plaquenil   - prednismoe 5 mg  01/01/2020 -   Chief Complaint  Patient presents with  . Follow-up    Pt states she has been doing okay since last visit. Pt states she has been having some trouble with the Esbriet medication.      HPI DORI DEVINO 59 y.o. -returns for follow-up of the above issues.  The main focus of this visit is to monitor her for pirfenidone tolerance.  She started the pirfenidone approximately December 17, 2019.  She says she has done well with 1 tablet 3 times daily.  She is adequately spacing and taking it with food.  However the following week when she went up to 2 tablets 3 times daily she started having significant amount of nausea and vomiting.  Therefore she stopped.  No abdominal pain or diarrhea.  She is intentionally lost some weight.  She thinks the nausea and vomiting was related to taking the second shot of the Covid vaccine.  Nevertheless she is apprehensive about going up on the pirfenidone.  Currently tolerating it well in her second or third week of 1 tablet 3 times daily.  She wants further advice.  She supposed to have pulmonary function testing today.  Her last liver function testing was December 10, 2019 and her AST was high normal.  In terms of hemoptysis: Mild trace hemoptysis continues to be present  In terms of her aspergilloma: She is continues to be on voriconazole  In terms of rheumatoid arthritis she is on Plaquenil and prednisone 5 mg/day.  She visit with Dr. Amil Amen December 27, 2019 notes reviewed  In terms of a cardiac murmur: She says she saw Dr. Angelena Form nurse practitioner but I cannot review this note I do not know why.     ROS - per HPI     has a past medical history of Abnormal Pap smear (09/2006), CHF (congestive heart failure) ([place]), Foot fracture, left  (10/16), Pneumonia, and RA (rheumatoid arthritis) ([place]) (07/2009).   reports that she quit smoking about 5 years ago. Her smoking use included cigarettes and e-cigarettes. She started smoking about 36 years ago. She has a 30.00 pack-year smoking history. She  has never used smokeless tobacco.  Past Surgical History:  Procedure Laterality Date  . BRONCHIAL WASHINGS  11/27/2019   Procedure: BRONCHIAL WASHINGS;  Surgeon: Brand Males, MD;  Location: [facility];  Service: Cardiopulmonary;;  . CERVICAL BIOPSY  W/ LOOP ELECTRODE EXCISION  2008   CIN 3  . CERVICAL CONIZATION W/BX N/A 12/15/2015   Procedure: CONIZATION CERVIX WITH BIOPSY ;  Surgeon: Megan Salon, MD;  Location: [facility];  Service: Gynecology;  Laterality: N/A;  POSSIBLE COLD KNIFE CONE.  Please have the microscope in the room  . COLONOSCOPY    . LEEP N/A 12/15/2015   Procedure: LOOP ELECTROSURGICAL EXCISION PROCEDURE (LEEP) with colpo;  Surgeon: Megan Salon, MD;  Location: [facility];  Service: Gynecology;  Laterality: N/A;  . PILONIDAL CYST EXCISION  1999   I&D  . VIDEO BRONCHOSCOPY N/A 11/27/2019   Procedure: VIDEO BRONCHOSCOPY WITHOUT FLUORO;  Surgeon: Brand Males, MD;  Location: [facility];  Service: Cardiopulmonary;  Laterality: N/A;  . WISDOM TOOTH EXTRACTION      Allergies  Allergen Reactions  . Bee Venom Anaphylaxis  . Codeine Other (See Comments)    "seeing spots"  . Penicillins Rash    Has patient had a PCN reaction causing immediate rash, facial/tongue/throat swelling, SOB or lightheadedness with hypotension: No Has patient had a PCN reaction causing severe rash involving mucus membranes or skin necrosis: No Has patient had a PCN reaction that required hospitalization No Has patient had a PCN reaction occurring within the last 10 years: Yes If all of the above answers are "NO", then may proceed with Cephalosporin use.     Immunization History  Administered Date(s) Administered  . Influenza Inj Mdck  Quad Pf 05/25/2019  . Influenza,inj,Quad PF,6+ Mos 06/25/2015, 05/31/2017, 05/29/2018  . Influenza-Unspecified 06/06/2014  . PFIZER SARS-COV-2 Vaccination 11/29/2019, 12/22/2019  . Pneumococcal Conjugate-13 08/11/2018  . Pneumococcal Polysaccharide-23 02/18/2014  . Tdap 09/01/2016    Family History  Problem Relation Age of Onset  . Diabetes Mother   . Rheum arthritis Mother   . Diabetes Father   . COPD Father   . Bronchitis Father   . Cancer Brother        Lung  . Breast cancer Maternal Aunt 68  . CAD Neg Hx      Current Outpatient Medications:  .  albuterol (VENTOLIN HFA) 108 (90 Base) MCG/ACT inhaler, INHALE 2 PUFFS INTO THE LUNGS EVERY 6 HOURS AS NEEDED FOR WHEEZING OR SHORTNESS OF BREATH (Patient taking differently: Inhale 2 puffs into the lungs every 6 (six) hours as needed for wheezing or shortness of breath. ), Disp: 8.5 g, Rfl: 3 .  benzonatate (TESSALON) 200 MG capsule, TAKE 1 CAPSULE(200 MG) BY MOUTH THREE TIMES DAILY AS NEEDED FOR COUGH, Disp: 90 capsule, Rfl: 3 .  EPINEPHrine (EPIPEN 2-PAK) 0.3 mg/0.3 mL IJ SOAJ injection, Inject 0.3 mg into the muscle as needed for anaphylaxis., Disp: , Rfl:  .  escitalopram (LEXAPRO) 10 MG tablet, TAKE 1 TABLET BY MOUTH EVERY DAY (Patient taking differently: Take 10 mg by mouth daily. ), Disp: 90 tablet, Rfl: 2 .  HYDROcodone-homatropine (HYCODAN) 5-1.5 MG/5ML syrup, Take 5 mLs by mouth every 6 (six) hours as needed for cough., Disp: 240 mL, Rfl: 0 .  hydroxychloroquine (PLAQUENIL) 200 MG tablet, Take 200 mg by mouth 2 (two) times daily., Disp: , Rfl:  .  Pirfenidone (ESBRIET) 267 MG TABS, Take 267 mg by mouth in the morning, at noon, and at bedtime., Disp: , Rfl:  .  predniSONE (DELTASONE) 5 MG tablet, Take 5 mg by mouth daily with breakfast. , Disp: , Rfl:  .  voriconazole (VFEND) 200 MG tablet, Take 1 tablet (200 mg total) by mouth 2 (two) times daily., Disp: 60 tablet, Rfl: 2      Objective:   Vitals:   01/01/20 1031  BP:  118/60  Pulse: 80  Temp: 97.6 F (36.4 C)  TempSrc: Temporal  SpO2: 99%  Weight: 146 lb (66.2 kg)  Height: _0  (1.626 m)    Estimated body mass index is 25.06 kg/m as calculated from the following:   Height as of this encounter: _1  (1.626 m).   Weight as of this encounter: 146 lb (66.2 kg).  _2 @  Filed Weights   01/01/20 1031  Weight: 146 lb (66.2 kg)     Physical Exam Pleasant female with bilateral bibasal crackles.  Some clubbing present rheumatoid arthritis deformities of the hands present.  Rheumatoid nodules in the elbow present.  Otherwise nonfocal exam.       Assessment:       ICD-10-CM   1. ILD (interstitial lung disease) (HCC)  J84.9 Hepatic function panel  2. Encounter for therapeutic drug monitoring  Z51.81 Hepatic function panel       Plan:     Patient Instructions  Hemoptysis  - mild and ongoing and due to below lung issues but maybe better after voricanazole restart on 10/26/2019 - completed bronch 11/27/19 - no new culture results  Plan  - no specific intervention -0t his is your baseline  - any massive or major coughing up blood - go to ER   Pulmonary aspergilloma (HCC) Bronchiectasis without complication (HCC)  -noted back on voricanazole  Plan  - per Dr Linus Salmons - continue voricanazole   Interstitial lung disease due to connective tissue disease ([place]) - UIP pattern, progresive  - problems tolerating week 2 of esbriet - could be due to concomitant covid shot  Plan - check LFT 01/01/2020 - continue esbriet 1 tab three times daily this week   - On 01/07/20 Monday go to 2 tablets three times daily and stay at this dose for 2 weeks    - On 01/21/20 - Monday go to 3 tab three times daily   - Take OTC ginger capsule if you feel nausea  - always take with food and space esbriet 6h apar    Rheumatoid arthritis involving multiple joints (HCC)  -This appears active given the onset of pulmonary nodules and new elbows and also  interstitial lung disease in the lung and active joint pain  - I reviewed dR Beekman recent notes 12/27/19  Plan -plaquenil oer Dr Amil Amen -Keep your prednisone at 5 mg/day -Avoid biologic treatment for your rheumatoid arthritis but probably okay to start leflunomide  Interatrial cardiac shunt-this appears new on the echocardiogram January 2021 Cardiac murmur -old Coronary artery calcification-old  Plan - per  Dr. Lauree Chandler her cardiologist -  Follow-up - check LFT 01/01/2020' - cancel PFT 01/01/2020 but change it to 6 weeks - Amber Yopp for esbriet in 7-14 days -30-minute slot in 6 weeks with Dr Chase Caller - ILD clinic or any day - but after PFT     SIGNATURE    Dr. Brand Males, M.D., F.C.C.P,  Pulmonary and Critical Care Medicine Staff Physician, [place] Director - Interstitial Lung Disease  Program  Pulmonary [place] at [place], [place], [address]  Pager: [phone], If no  answer or between  15:00h - 7:00h: call [phone] Telephone: [phone]  11:17 AM 01/01/2020

## 2020-01-10 ENCOUNTER — Telehealth: Payer: Self-pay | Admitting: Internal Medicine

## 2020-01-10 ENCOUNTER — Telehealth: Payer: Self-pay | Admitting: Pharmacist

## 2020-01-10 ENCOUNTER — Other Ambulatory Visit: Payer: 59

## 2020-01-10 ENCOUNTER — Other Ambulatory Visit: Payer: Self-pay

## 2020-01-10 DIAGNOSIS — Z006 Encounter for examination for normal comparison and control in clinical research program: Secondary | ICD-10-CM

## 2020-01-10 DIAGNOSIS — J849 Interstitial pulmonary disease, unspecified: Secondary | ICD-10-CM

## 2020-01-10 LAB — ACID FAST CULTURE WITH REFLEXED SENSITIVITIES (MYCOBACTERIA): Acid Fast Culture: NEGATIVE

## 2020-01-10 MED ORDER — ONDANSETRON HCL 4 MG PO TABS: 4.0000 mg | ORAL_TABLET | Freq: Three times a day (TID) | ORAL | 3 refills | Status: AC | PRN

## 2020-01-10 NOTE — Research (Addendum)
Title: Chronic Fibrosing Interstitial Lung Disease with Progressive Phenotype Prospective Outcomes (ILD-PRO) Registry   Protocol #: IPF-PRO-SUB, Clinical Trials # [id number], Sponsor: [employer]  Protocol Version Amendment 4 dated 12Sep2019  and confirmed current on 01/10/2020 Consent Version for today's visit date of 01/10/2020  is Version 5 dated 05Dec2019.  Objectives:  Marland Kitchen Describe current approaches to diagnosis and treatment of chronic fibrosing ILDs with progressive phenotype  . Describe the natural history of chronic fibrosing ILDs with progressive phenotype  . Assess quality of life from self-administered participant reported questionnaires for each disease group  . Describe participant interactions with the healthcare system, describe treatment practices across multiple institutions for each disease group  . Collect biological samples linked to well characterized chronic fibrosing ILDs with progressive phenotype to identify disease biomarkers  . Collect data and biological samples that will support future research studies.                                            Key Inclusion Criteria: Willing and able to provide informed consent  Age ? 30 years  Diagnosis of a non-IPF ILD of any duration, including, but not limited to Idiopathic Non-Specific Interstitial Pneumonia (INSIP), Unclassifiable Idiopathic Interstitial Pneumonias (IIPs), Interstitial Pneumonia with Autoimmune Features (IPAF), Autoimmune ILDs such as Rheumatoid Arthritis (RA-ILD) and Systemic Sclerosis (SSC-ILD), Chronic Hypersensitivity Pneumonitis (HP), Sarcoidosis or Exposure-related ILDs such as asbestosis.  Chronic fibrosing ILD defined by reticular abnormality with traction bronchiectasis with or without honeycombing confirmed by chest HRCT scan and/or lung biopsy.  Progressive phenotype as defined by fulfilling at least one of the criteria below of fibrotic changes (progression set point) within  the last 24 months regardless of treatment considered appropriate in individual ILDs:  . decline in FVC % predicted (% pred) based on >10% relative decline  . decline in FVC % pred based on ? 5 - <10% relative decline in FVC combined with worsening of respiratory symptoms as assessed by the site investigator  . decline in FVC % pred based on ? 5 - <10% relative decline in FVC combined with increasing extent of fibrotic changes on chest imaging (HRCT scan) as assessed by the site investigator  . decline in DLCO % pred based on ? 10% relative decline  . worsening of respiratory symptoms as well as increasing extent of fibrotic changes on chest imaging (HRCT scan) as assessed by the site investigator independent of FVC change.    Key Exclusion Criteria: Malignancy, treated or untreated, other than skin or early stage prostate cancer, within the past 5 years  Currently listed for lung transplantation at the time of enrollment  Currently enrolled in a clinical trial at the time of enrollment in this registry     Clinical Research Coordinator / Research RN note : This visit for Subject Kimberly Shelton with DOB: 1961-05-21 on 01/10/2020 for the above protocol is Visit/Encounter #Enrollment/Baseline  and is for purpose of research. The consent for this encounter is under Protocol Version Amendment 4 (12Sep2019) and is currently IRB approved. Subject expressed continued interest and consent in continuing as a study subject. Subject confirmed that there was no change in contact information (e.g. address, telephone, email). Subject thanked for participation in research and contribution to science.   Duringthis visiton05/02/2020,the subject met with me in the office to discuss the protocol ICF and sign. The study coordinator went over  the entire ICF with the subject and the subject was given ample time to read and review the ICF. After review of the ICF,all questions were answered to the subject's  satisfaction. Dr. Brand Males reviewed the ICF with the patient, explained the purpose of research, and asked if there were any additional questions. She stated that thestudycoordinatorhad explained the study to her thoroughly and stated she had no additional questions. The subject,study coordinator,and PI signed the ICF and the subject was given a signed copy of the ICF for her records. After consent all study related procedures were conducted as per theabovestated protocol. For additional information on today's visit,please refer to subject's paper source binder.   Signed by  Doctor Phillips Assistant [facility]  [place], [place] 2:44 PM 01/10/2020

## 2020-01-10 NOTE — Telephone Encounter (Signed)
Called patient to notify that I would be unable to do in office visit today.  Patient willing to discuss Esbriet follow-up via the phone.  Patient states that she continues to have some nausea while taking Esbriet.  She is currently taking 2 pills 3 times a day with meals and will increase to 3 next week.  She is taking a ginger tablet 30-minute before meals which she has found to be somewhat helpful.  She also started taking both pills in the middle of her meal versus the beginning which has been helpful.  Recommended flat ginger ale after meals and taking 1 pill in the middle of the meal and 1 pill after to see if that helps ease nausea.  Patient verbalized understanding.  We will follow-up with patient in 1 week to see how she is tolerating the increased dose of 3 pills 3 times a day.  All questions encouraged and answered.  Instructed patient to call with any other questions or concerns or if her symptoms worsen.  Verlin Fester, PharmD, [place], CPP Clinical Specialty Pharmacist [phone]  01/10/2020 12:33 PM

## 2020-01-10 NOTE — Telephone Encounter (Signed)
Opened in error

## 2020-01-10 NOTE — Telephone Encounter (Signed)
Spoke with pt. She is aware of the below information. Rx has been sent in. Nothing further needed.

## 2020-01-10 NOTE — Telephone Encounter (Signed)
Just saw patient for ILD-pro research consent.  During this time she said that she was still having nausea with pirfenidone.  Ginger capsule is helping but only partially.  Plan -Send prescription for Zofran 4 mg 3 times daily as needed to be taken 30 minutes before pirfenidone -30-day supply with 3 refills

## 2020-01-15 NOTE — Progress Notes (Signed)
LFT normal

## 2020-01-18 NOTE — Progress Notes (Signed)
Patient identification verified. Normal LFT results provided. Patient verbalized understanding of results.

## 2020-01-21 ENCOUNTER — Other Ambulatory Visit: Payer: Self-pay

## 2020-01-21 ENCOUNTER — Ambulatory Visit (INDEPENDENT_AMBULATORY_CARE_PROVIDER_SITE_OTHER): Payer: 59 | Admitting: Internal Medicine

## 2020-01-21 ENCOUNTER — Encounter: Payer: Self-pay | Admitting: Internal Medicine

## 2020-01-21 VITALS — BP 113/71 | HR 72 | Temp 97.2°F | Wt 142.0 lb

## 2020-01-21 DIAGNOSIS — Z5181 Encounter for therapeutic drug level monitoring: Secondary | ICD-10-CM | POA: Diagnosis not present

## 2020-01-21 DIAGNOSIS — B449 Aspergillosis, unspecified: Secondary | ICD-10-CM | POA: Diagnosis not present

## 2020-01-21 LAB — HEPATIC FUNCTION PANEL
AG Ratio: 1.2 (calc) (ref 1.0–2.5)
ALT: 10 U/L (ref 6–29)
AST: 15 U/L (ref 10–35)
Albumin: 3.5 g/dL — ABNORMAL LOW (ref 3.6–5.1)
Alkaline phosphatase (APISO): 206 U/L — ABNORMAL HIGH (ref 37–153)
Bilirubin, Direct: 0.1 mg/dL (ref 0.0–0.2)
Globulin: 2.9 g/dL (calc) (ref 1.9–3.7)
Indirect Bilirubin: 0.1 mg/dL (calc) — ABNORMAL LOW (ref 0.2–1.2)
Total Bilirubin: 0.2 mg/dL (ref 0.2–1.2)
Total Protein: 6.4 g/dL (ref 6.1–8.1)

## 2020-01-21 NOTE — Assessment & Plan Note (Signed)
She is tolerating the voriconazole well and will continue through September/early October if she continues to tolerate.

## 2020-01-21 NOTE — Progress Notes (Signed)
Patient ID: Kimberly Shelton, female   DOB: 03-16-1961, 59 y.o.   MRN: [id number] Patient ID: Kimberly Shelton, female   DOB: 1961-04-11, 59 y.o.   MRN: [id number]   Subjective:    Patient ID: Kimberly Shelton, female    DOB: 1961-07-21, 59 y.o.   MRN: [id number]  HPI Here for follow up for pulmonary cavitary lesions.  I saw her last year and noted bronchiectasis and had a positive aspergillus Ig positive with negative Ag and no growth on sputum culture.  She had been relatively asymptomatic.  She more recently though has developed hemoptysis and a follow up CT scan with cavitary areas.  I put her on voriconazole and she took this for 3 months.  A repeat CT scan showed some improvement.    I did a televisit with her 11/25 and she noted some blood-tinged sputum.  Also talked to Ames Dura, NP at the pulmonary clinic.  Since then, it has mainly been light pink sputum, no overt blood.  No worsening symptoms. She completed prolonged voriconazole but returned to Dr. [name] and had some progression of symptoms and follow up CT with mild progression.   I started her back on voriconazole and she is tolerating it well.   She has started [employer].  No fever.  Continues to exercise some.  No worsening cough or congestion.     Review of Systems  Constitutional: Negative for chills and fever.  Respiratory: Negative for cough, shortness of breath, wheezing and stridor.   Skin: Negative for rash.       Objective:   Physical Exam Constitutional:      Appearance: Normal appearance.  Eyes:     General: No scleral icterus. Pulmonary:     Effort: Pulmonary effort is normal. No respiratory distress.  Skin:    Findings: No rash.  Neurological:     Mental Status: She is alert.  Psychiatric:        Mood and Affect: Mood normal.    SH: not vaping       Assessment & Plan:

## 2020-01-21 NOTE — Assessment & Plan Note (Signed)
Will check a hepatic panel today

## 2020-01-25 ENCOUNTER — Telehealth: Payer: Self-pay | Admitting: Pharmacist

## 2020-01-25 DIAGNOSIS — J849 Interstitial pulmonary disease, unspecified: Secondary | ICD-10-CM

## 2020-01-25 NOTE — Telephone Encounter (Signed)
Called patient to follow-up about nausea with Esbriet.  Patient states that she is still having issues with nausea.  She is only able to tolerate 1 pill 3 times daily with food along with Zofran.  She was unable to tolerate 2 pills 3 times a day as she would vomit shortly after taking.  We originally attempted approval for Ofev through her insurance but Ricki Rodriguez is preferred.  Advised that I will talk to Dr. [name] about potentially seeking approval for L5 through insurance and will update at her follow-up appointment on 6/9.  Instructed patient to continue taking Esbriet as as tolerated 1 pill 3 times daily with food.  All questions encouraged and answered.  Instructed patient to call with any further questions or concerns.   Verlin Fester, PharmD, [place], CPP Clinical Specialty Pharmacist (Rheumatology and Pulmonology)  01/25/2020 9:15 AM

## 2020-01-25 NOTE — Telephone Encounter (Signed)
-----   Message from [employer], RPH-CPP sent at 01/10/2020 12:36 PM EDT ----- Regarding: Esbriet Follow up Patient continues to have some nausea and is to increase to 3 pills three times daily next week. Will follow up in 1 week.

## 2020-01-25 NOTE — Telephone Encounter (Signed)
Stop Esbriet because it is not effective at 1 pill 3 times daily  Agree and support starting and proceeding with nintedanib application

## 2020-01-25 NOTE — Telephone Encounter (Addendum)
Called to notify patient of Dr. [name] only recommendations.  No answer.  Left voicemail instructing her to stop her current medication and we would proceed with approval for Ofev.  Advised we will update when we hear response.   Verlin Fester, PharmD, [place], CPP Clinical Specialty Pharmacist (Rheumatology and Pulmonology)  01/25/2020 2:15 PM

## 2020-01-25 NOTE — Telephone Encounter (Signed)
Submitted a Prior Authorization request to [facility] for OFEV via Cover My Meds. Will update once we receive a response.  Freada BergeronLendon Collar) - [id number]

## 2020-01-25 NOTE — Addendum Note (Signed)
Addended by: Verlin Fester C on: 01/25/2020 02:38 PM   Modules accepted: Orders

## 2020-01-25 NOTE — Telephone Encounter (Signed)
Please start benefits investigation for Ofev.  Patient was unable to tolerate Esbriet.   Verlin Fester, PharmD, [place], CPP Clinical Specialty Pharmacist (Rheumatology and Pulmonology)  01/25/2020 2:17 PM

## 2020-01-25 NOTE — Telephone Encounter (Signed)
Patient returned call.  Relayed Dr. [name] recommendations.  Advised we will be applying for approval of Ofev through insurance.  Patient verbalized understanding.  Patient counseled on purpose, proper use, and potential adverse effects including diarrhea, nausea, vomiting, abdominal pain, decreased appetite, weight loss, and increased blood pressure. Stressed the importance of routine lab monitoring. Will monitor LFT's every month for the first 6 months of treatment then every 3 months. Will monitor CBC every 3 months.  Ofev dose will be 150 mg capsule every 12 hours with food. Stressed importance of taking with food to minimize stomach upset. Patient verbalized understanding.  She was given the number to [employer] program to start the process of enrolling into co-pay assistance.   All questions encouraged and answered.  Instructed patient to call with any further questions or concerns.  Verlin Fester, PharmD, [place], CPP Clinical Specialty Pharmacist (Rheumatology and Pulmonology)  01/25/2020 2:35 PM

## 2020-01-28 ENCOUNTER — Other Ambulatory Visit: Payer: Self-pay | Admitting: Internal Medicine

## 2020-01-28 NOTE — Telephone Encounter (Signed)
Chart notes sent to [facility].  Will update when we receive a response.   Verlin Fester, PharmD, [place], CPP Clinical Specialty Pharmacist (Rheumatology and Pulmonology)  01/28/2020 3:33 PM

## 2020-01-28 NOTE — Telephone Encounter (Signed)
Called [facility] to check status of Ofev PA. Clinical team is requesting chart notes to be faxed in for request.  PA Case ID: [id number]  Fax# [phone] Phone# [phone].

## 2020-02-01 MED ORDER — OFEV 150 MG PO CAPS: 150.0000 mg | ORAL_CAPSULE | Freq: Two times a day (BID) | ORAL | 1 refills | Status: DC

## 2020-02-01 NOTE — Telephone Encounter (Signed)
Thanks and will close message. Reply not needed

## 2020-02-01 NOTE — Telephone Encounter (Signed)
Called to notify patient of approval.  Prescription sent to [facility] pharmacy mandated by her insurance.  Unable to determine co-pay but she should be able to use a co-pay card for assistance.  Instructed patient to call the office if her co-pay is unaffordable.  Patient verbalized understanding.  Patient was also able to speak with [facility] nurse educator Rosey Bath and is in the process of setting up an appointment.  All questions encouraged and answered.  Instructed patient to call with any further questions or concerns.  Verlin Fester, PharmD, [place], CPP Clinical Specialty Pharmacist (Rheumatology and Pulmonology)  02/01/2020 2:06 PM

## 2020-02-01 NOTE — Telephone Encounter (Signed)
Received notification from [facility] regarding a prior authorization for OFEV. Authorization has been APPROVED from 01/31/20 to 07/29/20.   Authorization # [id number] Phone # [phone]  Patient must fill through [facility]. Patient is eligible to use an Ofev copay card.

## 2020-02-05 ENCOUNTER — Telehealth: Payer: Self-pay | Admitting: Internal Medicine

## 2020-02-05 DIAGNOSIS — J849 Interstitial pulmonary disease, unspecified: Secondary | ICD-10-CM

## 2020-02-05 MED ORDER — OFEV 150 MG PO CAPS: 150.0000 mg | ORAL_CAPSULE | Freq: Two times a day (BID) | ORAL | 1 refills | Status: DC

## 2020-02-05 NOTE — Telephone Encounter (Signed)
Prescription sent to [facility] pharmacy.  Nothing further needed.  Verlin Fester, PharmD, [place], CPP Clinical Specialty Pharmacist (Rheumatology and Pulmonology)  02/05/2020 3:54 PM

## 2020-02-05 NOTE — Telephone Encounter (Signed)
Called [facility], plan has placed an override to allow patient to fill Ofev through [facility].

## 2020-02-07 ENCOUNTER — Telehealth: Payer: Self-pay | Admitting: Internal Medicine

## 2020-02-07 NOTE — Telephone Encounter (Signed)
Called pharmacy, patient is not on Esbriet, is now on Ofev. Nothing further needed.

## 2020-02-09 ENCOUNTER — Other Ambulatory Visit (HOSPITAL_COMMUNITY)
Admission: RE | Admit: 2020-02-09 | Discharge: 2020-02-09 | Disposition: A | Discharge status: Home / Self Care | Payer: 59 | Source: Ambulatory Visit | Attending: Internal Medicine | Admitting: Internal Medicine

## 2020-02-09 DIAGNOSIS — Z20822 Contact with and (suspected) exposure to covid-19: Secondary | ICD-10-CM | POA: Insufficient documentation

## 2020-02-09 DIAGNOSIS — Z01812 Encounter for preprocedural laboratory examination: Secondary | ICD-10-CM | POA: Insufficient documentation

## 2020-02-09 LAB — SARS CORONAVIRUS 2 (TAT 6-24 HRS): SARS Coronavirus 2: NEGATIVE

## 2020-02-12 ENCOUNTER — Telehealth: Payer: Self-pay | Admitting: Internal Medicine

## 2020-02-12 NOTE — Telephone Encounter (Signed)
Kimberly Shelton, can you help us out with this please. 

## 2020-02-13 ENCOUNTER — Ambulatory Visit (INDEPENDENT_AMBULATORY_CARE_PROVIDER_SITE_OTHER): Payer: 59 | Admitting: Internal Medicine

## 2020-02-13 ENCOUNTER — Ambulatory Visit: Payer: 59 | Admitting: Internal Medicine

## 2020-02-13 ENCOUNTER — Other Ambulatory Visit: Payer: Self-pay

## 2020-02-13 ENCOUNTER — Other Ambulatory Visit: Payer: Self-pay | Admitting: Obstetrics & Gynecology

## 2020-02-13 DIAGNOSIS — B449 Aspergillosis, unspecified: Secondary | ICD-10-CM

## 2020-02-13 DIAGNOSIS — J479 Bronchiectasis, uncomplicated: Secondary | ICD-10-CM | POA: Diagnosis not present

## 2020-02-13 DIAGNOSIS — M069 Rheumatoid arthritis, unspecified: Secondary | ICD-10-CM

## 2020-02-13 DIAGNOSIS — J8489 Other specified interstitial pulmonary diseases: Secondary | ICD-10-CM

## 2020-02-13 LAB — PULMONARY FUNCTION TEST
DL/VA % pred: 213 %
DL/VA: 9.07 ml/min/mmHg/L
DLCO cor % pred: 151 %
DLCO cor: 29.62 ml/min/mmHg
DLCO unc % pred: 83 %
DLCO unc: 16.24 ml/min/mmHg
FEF 25-75 Pre: 1.51 L/sec
FEF2575-%Pred-Pre: 65 %
FEV1-%Pred-Pre: 69 %
FEV1-Pre: 1.72 L
FEV1FVC-%Pred-Pre: 100 %
FEV6-%Pred-Pre: 71 %
FEV6-Pre: 2.19 L
FEV6FVC-%Pred-Pre: 103 %
FVC-%Pred-Pre: 68 %
FVC-Pre: 2.2 L
Pre FEV1/FVC ratio: 78 %
Pre FEV6/FVC Ratio: 100 %

## 2020-02-13 NOTE — Telephone Encounter (Signed)
Medication refill request: escitalopram  Last AEX:  05-03-2019 SM  Next AEX: 05-05-20 Last MMG (if hormonal medication request): n/a Refill authorized: Today, please advise.   Medication pended for #90, 0RF. Please refill if appropriate

## 2020-02-13 NOTE — Telephone Encounter (Signed)
Called [facility], they are currently verifying benefits for patient's Ofev. [employer] placed an override to allow [facility] to fill the medication, since [employer] does not have access.  Spoke to patient, she spoke to [employer] yesterday and they are mailing their forms to be completed to complete copay card enrollment. Patient has an appointment with Dr. Elvera Lennox tomorrow, will print forms and place in his box for patient signature.

## 2020-02-13 NOTE — Progress Notes (Signed)
Spiro/DLCO performed today. 

## 2020-02-13 NOTE — Telephone Encounter (Signed)
FYI EP

## 2020-02-14 ENCOUNTER — Telehealth: Payer: Self-pay | Admitting: Internal Medicine

## 2020-02-14 ENCOUNTER — Encounter: Payer: Self-pay | Admitting: *Deleted

## 2020-02-14 ENCOUNTER — Encounter: Payer: Self-pay | Admitting: Internal Medicine

## 2020-02-14 ENCOUNTER — Ambulatory Visit (INDEPENDENT_AMBULATORY_CARE_PROVIDER_SITE_OTHER): Payer: 59 | Admitting: Internal Medicine

## 2020-02-14 VITALS — BP 110/58 | HR 88 | Temp 98.0°F | Ht 64.0 in | Wt 145.0 lb

## 2020-02-14 DIAGNOSIS — J8489 Other specified interstitial pulmonary diseases: Secondary | ICD-10-CM

## 2020-02-14 DIAGNOSIS — J470 Bronchiectasis with acute lower respiratory infection: Secondary | ICD-10-CM | POA: Diagnosis not present

## 2020-02-14 DIAGNOSIS — M069 Rheumatoid arthritis, unspecified: Secondary | ICD-10-CM

## 2020-02-14 DIAGNOSIS — R042 Hemoptysis: Secondary | ICD-10-CM | POA: Diagnosis not present

## 2020-02-14 DIAGNOSIS — Z599 Problem related to housing and economic circumstances, unspecified: Secondary | ICD-10-CM

## 2020-02-14 DIAGNOSIS — Z0271 Encounter for disability determination: Secondary | ICD-10-CM

## 2020-02-14 DIAGNOSIS — M359 Systemic involvement of connective tissue, unspecified: Secondary | ICD-10-CM

## 2020-02-14 NOTE — Patient Instructions (Addendum)
Hemoptysis  - mild and ongoing and due to below lung issues but maybe better after voricanazole restart on 10/26/2019 - completed bronch 11/27/19 - no new culture results  Plan  - no specific intervention -at his is your baseline  - any massive or major coughing up blood - go to ER   Pulmonary aspergilloma (HCC) Bronchiectasis with LRTI Iinfection of aspergilloma  -noted back on voricanazole  Plan  - per Dr Luciana Axe - continue voricanazole   Interstitial lung disease due to connective tissue disease (HCC) - UIP pattern, progresive  -off esbriet due to side effects - waiting for ofev - fev1 is < 70%, DLCO test is inaccurate today  Plan -list esbriet as allergy - await ofev to start - test ono on room air - test 6 min walk test - first availble will call with results  Rheumatoid arthritis involving multiple joints (HCC)  -This appears active given the onset of pulmonary nodules and new elbows and also interstitial lung disease in the lung and active joint pain  - Plan -plaquenil oer Dr Dierdre Forth -Keep your prednisone at 5 mg/day -Avoid biologic treatment for your rheumatoid arthritis but probably okay to start leflunomide   Financial Difficulties Disabiltuy eval  - noted. I think you qualiufy for SSI dififculty from RA, and pulmonary stand point  Plan  - will do letter for you  - apply to medicaid as well  Follow-up - will call with 6 min walk resuls, ONO results and disability letter  - otherwise retrun in 8-12 weks for 30 min fae to face with Dr [name]

## 2020-02-14 NOTE — Progress Notes (Signed)
OV 03/04/2014 Chief Complaint  Patient presents with  . Follow-up    HFU-D/c from [facility]. Pt states she has chest soreness from coughing. Pt states with the O2 her breathing has improved. C/o dyspnea when working with PT and cough with yellow mucous.    Admitted 02/16/2014 through 02/24/2014 with right-sided pneumococcal pneumonia with cavitation in the setting of smoking and rheumatoid arthritis on immune modulators. At this point in time she is off the immunomodulators. Dr. Amil Amen her rheumatologist is aware of this. She is 25% back to her baseline but still very fatigued and short of breath. She coughs a lot with home physical therapy. She brings up green mucus and sputum with her cough needed overall cough is significantly improved since discharge. Appetite is slowly returning. She works as a Art gallery manager at [employer] and does not want to go to work till she is fully off oxygen. She is requesting to stay off work for several weeks. On 2 L there is documentation that when she walks 2 minutes up saturation still stays at 95%.   Today in the office pon room air at rest t it is 93%. Walked 40 feet and desaturated to 89% but HR 150/min per tech: I think this is all deconditioning but not sure if she has cardiomyopathy from her illness   04/02/14 Follow up  Pt returns for follow up from recent sever critical illness from streptococcal PNA , Bacteremia, sepsis 02/2014.   She had a right-sided pneumococcal pneumonia with cavitation in the setting of smoking and rheumatoid arthritis on immune modulators Today PFT show FEV1 61% , ratio 87  , FVC 55 %  , NO sign BD response, Diffusing capacity  Decreased 57% Has not smoked.  Wants to return to work soon.  CXR shows persistent RUL/LLL cavitary lesions .  Says she is feeling so much better.  No fever, chest pain, orthopnea, edema or n/v/d.  Good appetite . Seen by cards for tachycardia felt secondary to recent PN   OV 05/01/2014  Chief  Complaint  Patient presents with  . Follow-up    Pt states her breathing has improved since last OV. Pt c/o prod cough with yellow and clear mucous and lower mid lumbar pain, pt thinks d/t RA. Pt doing duoneb only once a day.  Pt denies SOB.     Admitted 02/16/2014 through 02/24/2014 with right-sided pneumococcal pneumonia with cavitation in the setting of smoking and rheumatoid arthritis on immune modulators. Current followup is for the same   S: Presents with husband. Feels great overall. No fatigute. No dyspnea. Able to do all ADLs. Wants to return to work now and is asking for release to work. In terms of RA, she feels early morning stiffness of hand joint is returning. SHe is seeing Dr Amil Amen 05/01/14 and will discuss with him about return to immunomodulators. There are no other issues. CXR done after she left and at time of this note:    Dg Chest 2 View  04/30/2014   CLINICAL DATA:  Followup cavitary pneumonia  EXAM: CHEST  2 VIEW  COMPARISON:  04/02/2014  FINDINGS: Normal heart size, mediastinal contours, and pulmonary vascularity.  Patchy infiltrates are identified in RIGHT upper lobe and LEFT lower lobe consistent with pneumonia.  Cavitary foci identified previously in the in both the RIGHT upper and LEFT lower lobes are still seen, the less evident in the RIGHT upper lobe.  Scattered interstitial prominence again identified.  No gross pleural  effusion or pneumothorax.  Bones unremarkable.  IMPRESSION: Persistent infiltrates are identified in the RIGHT upper lobe and LEFT lower lobe with persistent visualization of cavitary foci.   Electronically Signed   By: Lavonia Dana M.D.   On: 04/30/2014 19:09      OV 08/09/2014  Chief Complaint  Patient presents with  . Follow-up    Pt here after CT scan. Pt denies change in breathing since last OV. Pt c/o prod cough with clear and green mucus.     Presents for Pneumococcal pneumonia followup that developed in setting of RA and Simponi  She  is being maintained on ARAVA since her pneumococcal pneumonia earlier in 2013.  Now she is free of symptoms of respiratory tree. CT 08/08/14 shows further improvement but has architectural distortion. HEr RA is now significantly  active - per her and my talking 08/09/2014 with Dr Amil Amen. Patient is very apprehensive of starting ORENCIA recommended by Dr Amil Amen due to increased aecopd risk (She does not have copd), recurrent infection esp in setting of focal archiectrual distortion and recent pneumonia. She is very scared of dealing with RA disability v infection risk   Social and past and family: Mom had RA. She is upset abotu her disease  CT 08/08/14  IMPRESSION: 1. Emphysema with scattered scarring, volume loss, scattered bulla (some of which are still inflamed and with a somewhat cavitary appearance), and bilateral airway thickening. Much of this represents residua from prior multi lobar cavitary pneumonia, and is improved compared to the prior July CT scan. Given the degree of architectural distortion, scattered nodularity and opacities, and and irregular scarring, CT is not able to clear the lung of the possibility of malignancy, and surveillance imaging may be warranted.   Electronically Signed  By: Sherryl Barters M.D.  On: 08/08/2014 15:32  OV 11/06/2014  Chief Complaint  Patient presents with  . Follow-up    Coughing up whitish colored phlegm, SOB with walking, not always, sometimes.      Presents for Pneumococcal pneumonia followup that developed in setting of RA and Simponi June 2015, with resultant architectural distortion in her lung anatomy  Last seen December 2015. Since then overall she's doing well. At baseline she only has mild cough with occasional white sputum. She also has exertional dyspnea when she climbs up a hill but clearly in cold air he did this is relieved by rest. She is currently on 2 immunomodulators for her rheumatoid arthritis Orencia and Arava and  this is helping her joints. She's not had any respiratory complications so far. I notice that she is not on Bactrim prophylaxis but she is concerned about the side effects. And she wants to talk to her rheumatologist Dr. Amil Amen about this  Past, Family, Social reviewed: no change since last visit   OV 02/10/2015  Chief Complaint  Patient presents with  . Follow-up    Pt stated her breathing feels it has improved since last OV. Pt here after PFT. Pt stated she has a little more trouble breathing when it is hot out. Pt c/o mild prod cough with clear and small light green mucus. Pt denies CP/tightness.    Follow-up chronic destructive pulmonary to changes following pneumococcal pneumonia in 2015 in the setting of previous heavy smoking and immunomodulatory treatment for rheumatoid arthritis   Since seeing me last in March 2016 she feels that the albuterol inhaler that I tried for dyspnea and exertion and wheezing has helped. Dyspnea still persists. It is unchanged. It is  mild. However it is improved with albuterol which she takes prior to exertion. There is no associated cough or. Wheezing is improved. No hemoptysis no fever no chills .Outside chart from Dr. Amil Amen her rheumatologist dated 02/04/2015 reviewed. She reported to him that she is compliant on Ornecia and arava with good relief and joint pain. Her dyspnea is mild chronic and stable. His advice for her to continue the same. He suggested that I look into her chronic residual dyspnea at this visit   Last pulmonary imaging with CT scan of the chest December 2015:residual changes + along with emphysema; report reviewed. Image not visualized  Pulmonary function test 02/10/2015 today personally reviewed image: FEV1 prebronchodilator 1.7 L/64%, ratio 69.  FEV1 postbronchodilator is 1.9 L/72% and is a 12% positive response with broncho-dilator., FVC 2.4 L/72% and ratio of 79. Total lung capacity is 3.76 L/76%. DLCO is 18.5/80%. PFTs are consistent  with restriction and a normal DLCO but in the prebronchodilator she has mild obstruction   OV 08/11/2015  Chief Complaint  Patient presents with  . Follow-up    Pt states her breathing has improved since last OV. Pt states she has only been using the spiriva as needed. Pt c/o prod cough with clear mucus and DOE with heavy exertion. pt denies CP/tightness.     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 She continues to do well. She is now on leflunomide and Orencia for her rheumatoid arthritis. With this rheumatoid arthritis symptoms have improved. She is follows up with Dr. Amil Amen in his new rheumatology practice [facility]. She feels that her wheezing has significantly improved. Shortness of breath significantly improved. She is not taking Spiriva anymore. She reported some hoarseness with it. She wants to continue to watch herself without Spiriva. She is up-to-date with her flu shot.   OV 02/09/2016  Chief Complaint  Patient presents with  . Follow-up    Pt reports that her breathing is doing well. Pt c/o occasional dry cough that is worse when using a vape for recreational use. Pt also c/o occasional wheeze with hot, humid weather and exertion. Pt does use albuterol HFA and her symptoms improve. Pt denies SOB/CP/tightness.        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 Doing well but has started smoking again though is nicotine-free vape smoking now. She did not want spiriva due to dpi and throat irritation. But she is using albuterol atleast daily. She is having exertional wheeze but otherwise okayt. Maureen Chatters  continues with Dr Amil Amen. She tells me she never had shingles vaccine. Otherwise well. Working at [employer] . Does not want cxr 02/09/2016 for fu. Wants to defer till next visit   OV 12/08/2017  Chief Complaint  Patient presents with  . Follow-up    Last seen 02/09/16.  Pt states she has been doing well since last visit and denies any complaints.      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  She went through a separation and divorce and this time.  Smoking continues  to be in remission.  At last visit she was smoking but now is in remission.  She continues to work at the [employer].  For her rheumatoid arthritis she has been switched out of orcencia to [place] and this is working well for her.  She only has some mild pain.  In terms of her respiratory symptoms she hardly has any symptoms.  Definitely no cough.  She has mild dyspnea on exertion when working out in the yard but this is stable.  Review of the chart shows that her last echocardiogram was in 2015.  Her last chest x-ray or imaging was in 2016 and so was a pulmonary function test.  She wants to make sure her lungs are doing okay at this point.   OV 01/27/2018  Chief Complaint  Patient presents with  . Follow-up    Breathing is improved since last OV. Still has some coughing in the mornings. Denies chest tightness, wheezing.   Keyoni Lapinski presents for follow-up after testing.  In the interim she continues to be stable.  She only has mild dyspnea on exertion when she does gardening.  For this she uses albuterol as needed she does not want to upscale on her inhalers.  Testing showed atrial septal aneurysm and coronary artery calcification.  Cardiology has reassured her on both accounts.  In terms of the lung function that seems to be  a decline in FEV1 compared to 2016 but very similar to 2015.  DLCO is stable all along.  She feels stable.  CT scan of the chest shows improved bronchiectasis compared to few years ago.  The only issue is that in the right upper lobe that seems to be a cavity with a potential aspergilloma although this is totally uncertain at this stage.  She does not have any hemoptysis or any symptoms.  She is immunocompromised on rheumatoid arthritis medication Orencia at this point.  Apparently the Morrie Sheldon was stopped.   Results for DESHAUN, SCHOU (MRN [id number]) as of 01/27/2018 10:48  Ref. Range 04/02/2014 11:42 02/10/2015 13:49 01/27/2018 09:57  FEV1-Post Latest Units: L 1.68 1.89 1.63  FEV1-%Pred-Post Latest Units: % 64 73 64  FEV1-%Change-Post Latest Units: % _0 Results for DEQUITA, SCHLEICHER (MRN [id number]) as of 01/27/2018 10:48  Ref. Range 04/02/2014 11:42 02/10/2015 13:49 01/27/2018 09:57  DLCO unc Latest Units: ml/min/mmHg 13.15 18.50 18.02  DLCO unc % pred Latest Units: % 57 80 78    IMPRESSION: CT chest 1. Widespread areas of bronchiectasis and scarring, generally similar to the prior examination. Two of the thick-walled cavitary areas in the right upper lobe seen on the prior study have resolved, however, there is a new area of scarring in the right upper lobe near the apex which contains some internal soft tissue which is favored to represent some sloughed necrotic lung tissue, although an aspergilloma is not entirely excluded. Close attention on future follow-up imaging is recommended. 2. Possible honeycombing in the extreme lung bases which could indicate early interstitial lung disease. Future follow-up examination should be performed is high-resolution chest CTs to better evaluate these findings. 3. Cholelithiasis. 4. Aortic atherosclerosis, in addition to 2 vessel coronary artery disease. Please note that although the presence of coronary artery calcium documents the presence  of coronary artery disease, the severity of this disease and any potential stenosis cannot be assessed on this non-gated CT examination. Assessment for potential risk factor modification, dietary therapy or pharmacologic therapy may be warranted, if clinically indicated.  Aortic Atherosclerosis (ICD10-I70.0).   Electronically Signed   By: Vinnie Langton M.D.   On: 12/21/2017 13:00   ECHO 12/12/17 Study Conclusions  - Left ventricle: The cavity size was normal. Wall thickness was   normal. Systolic function was vigorous. The estimated ejection   fraction was in the range of 65% to 70%. Wall motion was normal;   there were no regional wall motion abnormalities. Doppler   parameters are consistent with abnormal left ventricular   relaxation (grade 1 diastolic dysfunction). - Atrial septum: There was an atrial septal aneurysm.  Impressions:  - Vigorous LV systolic function, mild diastolic dysfunction.  OV 08/11/2018  Subjective:  Patient ID: Jonne Ply, female , DOB: [birth year] , age 9 y.o. , MRN: [id number] , ADDRESS: [place] [id number]   08/11/2018 -   Chief Complaint  Patient presents with  . Follow-up    f/u bronchiectasis, PFT today, pt doing well , only cough in tha am with little mucus     HPI MALLEY HAUTER 59 y.o. -presents for follow-up of bronchiectasis suffered in 2015 following really severe pneumococcal pneumonia in the setting of immunosuppression due to rheumatoid arthritis.  This routine follow-up.  When I saw her in the spring 2019 we did a CT scan of the chest and there was suggestion of possible aspergillus cavity.  Did Aspergillus antibody and she was positive for IgG.  Therefore referred her to infectious diseases.  I reviewed the note.  Given overall stability expectant approach has been adopted.  At this point in time she feels stable using albuterol as needed.  Vaccine records indicate she could benefit from both  shingles vaccine and Prevnar.  She seems to be under the assumption that shingles vaccine is only after age 52.  I reviewed the literature and it is actually indicated for people over 50.  However she should only get the new Shingrix vaccine which is inactivated.  Prevnar is also indicated for her given her previous pneumococcal pneumonia and her immunosuppressed status.  Coronary artery calcification on CT scan: This was seen in April 2019.  Since then she did see cardiology.  They are aware of this and the place her on follow-up.  She does not have any chest pain currently.    OV 09/13/2019  Subjective:  Patient ID: Jonne Ply, female , DOB: 03/21/1961 , age 70 y.o. , MRN: [id number] , ADDRESS: [place] [id number]   09/13/2019 -   Chief Complaint  Patient presents with  . Follow-up    Pt states she has been spitting up bloody mucus every morning and states her throat is also raw feeling. Pt was diagnosed with covid 11/19 and did okay with that other than losing her sense of taste and smell.   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 Gerri Spore  HPI Jonne Ply 59 y.o. -returns for follow-up.  Last seen in December 2019.  After that in the summer 2020 CT scan of the chest showed progression.  I referred her to Dr. Scharlene Gloss who placed her on voriconazole.  She is currently finished her treatment with voriconazole but she tells me for the last 6 months she continues to have early morning mild hemoptysis versus pink sputum.  She says  that despite the voriconazole this persist.  In early November 2020/mid November 2020 she picked up COVID-19 while singing in a church along with her sister who also had COVID-19.  One of the  person died in that cluster.  That happens to be my patient.  She tells me since the COVID-19 for which she did not get hospitalized she has had slight increase in cough and slight increase in hemoptysis and ongoing fatigue.  For her rheumatoid arthritis she is only on prednisone she is unable to take any immunomodulators because of her pulmonary issues.  The disease has gotten progressive with the onset of rheumatoid nodules in her bilateral elbows.  She has gained weight in the pandemic because of being sedentary.   Review of the chart indicates last echo was in 2019.  She has a murmur.  Her last CT scan of the chest was in August 2020.   She wants to know if it is okay to go back on immunosuppressive's.- d/w Dr Amil Amen -  DMARD prob ok but not biologics   OV 10/11/2019  Subjective:  Patient ID: Jonne Ply, female , DOB: [birth year] , age 22 y.o. , MRN: [id number] , ADDRESS: [place] [address]   10/11/2019 -   Chief Complaint  Patient presents with  . 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 Gerri Spore  #small hiatl hernia  #coronary artery calcification and baseline heart murmur - dr Angelena Form   HPI MATA ROWEN 59 y.o. -presents to discuss the test results in the setting of above medical issues.  She again tells me that she started having hemoptysis 6 months ago or so.  She was then given voriconazole.  She says with that the pink slight hemoptysis did not resolve.  Then she finished an adequate course of voriconazole but the light streaky pink hemoptysis is continued.  Then several days ago she had like one dark clot.  She is now given that sputum for  AFB cultures are pending smear is negative.  Otherwise new problem is she is constipated for 3 days.  She is also taking her prednisone at 5 mg/day because it is causing increased weight gain.  Her arthralgia is worse.  She plans to see rheumatology in a week or 2.  New issue is constipation for the last 3 days.  However in review of the chart she has had constipation in the past but she is expressing it to me for the first time.  She also wants treatment for this.  In terms of CT scan of the chest: Radiologist calling for presence of UIP/ILD with progression.  In addition she has a fibrocavitary fibronodular changes with aspergilloma.  She also has coronary artery calcification  Echocardiogram shows left ventricular hypertrophy with normal ejection fraction but there is a description of left to right interatrial shunt.  I think this is a new finding.  IMPRESSION Jan 2021 1. Spectrum of findings at the lung bases worrisome for progressive fibrotic interstitial lung disease with mild-to-moderate honeycombing, clearly progressive compared to 2015 and 2019 chest CT studies, with more mild progression since 05/02/2019 chest CT. Findings are consistent with UIP per consensus guidelines: Diagnosis of Idiopathic Pulmonary Fibrosis: An Official  ATS/ERS/JRS/ALAT Clinical Practice Guideline. [place], Iss 5, [phone], May 07 2017. 2. Extensive chronic fibrocavitary and fibronodular changes in the right greater than left lung apices with evidence of mycetomas in the large irregular thick walled cavitary region in the apical right lung, which has mildly increased in size. 3. One vessel coronary atherosclerosis. 4. Stable mild right paratracheal lymphadenopathy, most compatible with benign reactive etiology. 5. Small hiatal hernia. 6. Cholelithiasis.  Aortic Atherosclerosis (ICD10-I70.0) and Emphysema (ICD10-J43.9).   Electronically Signed   By: Ilona Sorrel M.D.   On:  09/26/2019 16:43  ECHO Jan 2021  -- LVH ef 65% ROS - per HPI   OV 10/29/2019  Subjective:  Patient ID: Jonne Ply, female , DOB: 1961-04-15 , age 46 y.o. , MRN: [id number] , ADDRESS: [place] [id number]   10/29/2019 -   Chief Complaint  Patient presents with  . Follow-up    Pt states she has been doing okay since last visit. States she is still occ coughing up blood. Pt states that she is sore/achey in the mornings prior to getting up and will take aleve to help with that.   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 Linus Salmons  #ILD/UIP  - progressive UIP pattern - on HRCt April 2019 -> 2021 Jan  -PFT testing incongruent  #small hiatl hernia  #coronary artery calcification and baseline heart murmur - dr Angelena Form  #RA  - Dr Amil Amen HPI Jonne Ply 59 y.o. -returns for follow-up.  Last seen in early in February 2021.  Several concerns at that time.  In the interim I presented her case at the multidisciplinary case conference.  The determination was that she had early ILD changes even in April 2019 and is now progressed to a definite UIP pattern.  This is a new finding for [place].  Concern of the conference is that she has rheumatoid arthritis ILD/UIP pattern.  And this is progressive.  There for this visit is to discuss specifically the UIP issues but also round of the other issues.  Again with this diagnosis.  Also discussed the potential for participation in a research registry.  She is interested.  We also discussed antifibrotic therapy nintedanib.  Discussed contraindications which she does not  seem to have.  She does not have colitis.  Infectious constipation.  She does not have any bleeding diathesis.  Not on blood thinners.  She is on voriconazole and Plaquenil and now with nintedanib she will require intensive liver function test monitoring.  She is open for this.  She understands this drug is to help prevent fibrosis getting worse but will not manage her symptoms.  In terms of her ongoing hemoptysis with fibrocavitary bronchiectasis and aspergilloma: She revisited with Dr. Gerri Spore.  I reviewed the note.  Patient has been restarted on voriconazole 3 days ago on October 26, 2019.  After this the hemoptysis might be slightly better.  Multidisciplinary case conference consideration is for bronchoscopy with lavage but again this has been disrupted because of the COVID-19 pandemic.  She thinks hemoptysis is slightly better but is still present.   In terms of rheumatoid arthritis: She  visited with Dr. Amil Amen.  I reviewed the note.  She has been asked to continue with the prednisone 5 mg/day.  Plaquenil was been added because of her pain.  She is extremely upset about her rheumatoid arthritis nodules in the bilateral elbow region.  Also with ongoing pain.  In terms of coronary artery calcification based on heart murmur: I referred her to Dr. Angelena Form.  Was rereferral.  I do not see an appointment made.        OV 01/01/2020  Subjective:  Patient ID: Jonne Ply, female , DOB: 1961/02/10 , age 60 y.o. , MRN: [id number] , ADDRESS: [place] [address] Follow-up   01/01/2020 -   Chief Complaint  Patient presents with  . Follow-up    Pt states she has been doing okay since last visit. Pt states she has been having some trouble with the Esbriet medication.      HPI SYNA GAD 59 y.o. -returns for follow-up of the above issues.  The main focus of this visit is to monitor her for pirfenidone tolerance.  She started the pirfenidone approximately December 17, 2019.  She says she has done well with 1 tablet 3 times daily.  She is adequately spacing and taking it with food.  However the following week when she went up to 2 tablets 3 times daily she started having significant amount of nausea and vomiting.  Therefore she stopped.  No abdominal pain or diarrhea.  She is intentionally lost some weight.  She thinks the nausea and vomiting was related to taking the second shot of the Covid vaccine.  Nevertheless she is apprehensive about going up on the pirfenidone.  Currently tolerating it well in her second or third week of 1 tablet 3 times daily.  She wants further advice.  She supposed to have pulmonary function testing today.  Her last liver function testing was December 10, 2019 and her AST was high normal.  In terms of hemoptysis: Mild trace hemoptysis continues to be present  In terms of her aspergilloma: She is continues to be on voriconazole  In terms of rheumatoid arthritis she is on Plaquenil and prednisone 5 mg/day.  She visit with Dr. Amil Amen December 27, 2019 notes reviewed  In terms of a cardiac murmur: She says she saw Dr. Angelena Form nurse practitioner but I cannot review this note I do not know why.   OV 02/14/2020  Subjective:  Patient ID: Jonne Ply, female , DOB: 07/18/1961 , age 45 y.o. , MRN: [id number] , ADDRESS: [place] [address]   02/14/2020 -   Chief Complaint  Patient presents with  . Follow-up    Pt states she has been doing okay since last visit. 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 Linus Salmons  #ILD/UIP  - progressive UIP pattern - on HRCt April 2019 -> 2021 Jan  - started esbriet approx 12/10/19 -stopped May 2021 due to GI side effects  -  Awaiting nintedanib and June 2021.  #small hiatl hernia  #coronary artery calcification and baseline heart murmur - dr Angelena Form  #RA  - Dr Amil Amen   - plaquenil   - prednismoe 5 mg  -Severe pain  -Unable to take immunosuppressive drugs because of respiratory infection.   HPI TREVA HUYETT 59 y.o. -returns for follow-up. This visit was supposed to be to evaluate how she is doing off the pirfenidone having started nintedanib. Pirfenidone she had significant GI intolerance. She is doing better from a GI tolerance standpoint without the pirfenidone but she is having no luck getting nintedanib approved. A pharmacy is working on this. She continues to have intermittent hemoptysis. She has bronchiectasis from her 15 pneumonia. That is persistently infected with aspergilloma. On top of this she is also got pulmonary fibrosis. This is preventing him from getting immunosuppressive's. As a result she is only on Plaquenil and low-dose prednisone. Currently she is dealing with severe pain along with a daily intermittent hemoptysis. She is barely able to sit or stand. She not able to likes her wrist completely. She has new onset rheumatoid nodule in her feet now. The rheumatoid nodules in her elbows are already present. She has difficulty putting her clothes on. At this point in time she is completely unemployed. Her mortgage, her food her clothes and her car loan is being paid for by family. Given her health insurance is being paid for by family. She is not on Medicaid. She applied for Social Security disability but got denied. She is upset and she is in tears. Her FEV1 is less than 70%. Lung function testing serial as documented below. The DLCO today is  inaccurate.   SYMPTOM SCALE - ILD 02/14/2020   O2 use ra  Shortness of Breath 0 -> 5 scale with 5 being worst (score 6 If unable to do)  At rest 3  Simple tasks - showers, clothes change, eating, shaving 3  Household (dishes, doing bed, laundry) 3  Shopping 3  Walking level at own pace 3  Walking up Stairs 4  Total (30-36) Dyspnea Score 19  How bad is your cough? 3  How bad is your fatigue 4  How bad is nausea 4  How bad is vomiting?  2  How bad is diarrhea? 2  How bad is anxiety? 4  How bad is depression 4       Results for ALEEYAH, BENSEN (MRN [id number]) as of 02/14/2020 12:34  Ref. Range 04/02/2014 11:42 02/10/2015 13:49 01/27/2018 09:57 08/11/2018 08:53 02/13/2020 14:55  FVC-Pre Latest Units: L 1.85 2.42 2.22 2.26 2.20  FVC-%Pred-Pre Latest Units: % 55 73 68 69 68   Results for SHAQUALA, BROEKER (MRN [id number]) as of 02/14/2020 12:34  Ref. Range 04/02/2014 11:42 02/10/2015 13:49 01/27/2018 09:57 08/11/2018 08:53 02/13/2020 14:55  DLCO unc Latest Units: ml/min/mmHg 13.15 18.50 18.02 15.53 16.24  DLCO unc % pred Latest Units: % 57 80 78 67 83   ROS - per HPI     has a past medical history of Abnormal Pap smear (09/2006), CHF (congestive heart failure) ([place]), Foot fracture, left (10/16), Pneumonia, and RA (rheumatoid arthritis) ([place]) (07/2009).   reports that she quit smoking about 5 years ago. Her smoking use included cigarettes and e-cigarettes. She started smoking about 36 years ago. She has a 30.00 pack-year smoking history. She has never used smokeless tobacco.  Past Surgical History:  Procedure Laterality Date  . BRONCHIAL WASHINGS  11/27/2019   Procedure: BRONCHIAL WASHINGS;  Surgeon: Brand Males, MD;  Location: [facility];  Service: Cardiopulmonary;;  . CERVICAL BIOPSY  W/ LOOP ELECTRODE EXCISION  2008   CIN 3  . CERVICAL CONIZATION W/BX N/A 12/15/2015   Procedure: CONIZATION CERVIX WITH BIOPSY ;  Surgeon: Megan Salon, MD;  Location: [facility];  Service:  Gynecology;  Laterality: N/A;  POSSIBLE COLD KNIFE CONE.  Please have the microscope in the room  . COLONOSCOPY    . LEEP N/A 12/15/2015   Procedure: LOOP ELECTROSURGICAL EXCISION PROCEDURE (LEEP) with colpo;  Surgeon: Megan Salon, MD;  Location: [facility];  Service: Gynecology;  Laterality: N/A;  . PILONIDAL CYST EXCISION  1999   I&D  . VIDEO BRONCHOSCOPY N/A 11/27/2019   Procedure: VIDEO BRONCHOSCOPY WITHOUT FLUORO;  Surgeon: Brand Males, MD;  Location: [facility];  Service: Cardiopulmonary;  Laterality: N/A;  . WISDOM TOOTH EXTRACTION      Allergies  Allergen Reactions  . Bee Venom Anaphylaxis  . Codeine Other (See Comments)    "seeing spots"  . Pirfenidone Other (See Comments)    GI side effects  . Penicillins Rash    Has patient had a PCN reaction causing immediate rash, facial/tongue/throat swelling, SOB or lightheadedness with hypotension: No Has patient had a PCN reaction causing severe rash involving mucus membranes or skin necrosis: No Has patient had a PCN reaction that required hospitalization No Has patient had a PCN reaction occurring within the last 10 years: Yes If all of the above answers are "NO", then may proceed with Cephalosporin use.     Immunization History  Administered Date(s) Administered  . Influenza Inj Mdck Quad Pf 05/25/2019  . Influenza,inj,Quad PF,6+ Mos 06/25/2015, 05/31/2017, 05/29/2018  . Influenza-Unspecified 06/06/2014  . PFIZER SARS-COV-2 Vaccination 11/29/2019, 12/22/2019  . Pneumococcal Conjugate-13 08/11/2018  . Pneumococcal Polysaccharide-23 02/18/2014  . Tdap 09/01/2016    Family History  Problem Relation Age of Onset  . Diabetes Mother   . Rheum arthritis Mother   . Diabetes Father   . COPD Father   . Bronchitis Father   . Cancer Brother        Lung  . Breast cancer Maternal Aunt 68  . CAD Neg Hx      Current Outpatient Medications:  .  albuterol (VENTOLIN HFA) 108 (90 Base) MCG/ACT inhaler, INHALE 2 PUFFS INTO  THE LUNGS EVERY 6 HOURS AS NEEDED FOR WHEEZING OR SHORTNESS OF BREATH (Patient taking differently: Inhale 2 puffs into the lungs every 6 (six) hours as needed for wheezing or shortness of breath. ), Disp: 8.5 g, Rfl: 3 .  benzonatate (TESSALON) 200 MG capsule, TAKE 1 CAPSULE(200 MG) BY MOUTH THREE TIMES DAILY AS NEEDED FOR COUGH, Disp: 90 capsule, Rfl: 3 .  EPINEPHrine (EPIPEN 2-PAK) 0.3 mg/0.3 mL IJ SOAJ injection, Inject 0.3 mg into the muscle as needed for anaphylaxis., Disp: , Rfl:  .  escitalopram (LEXAPRO) 10 MG tablet, TAKE 1 TABLET BY MOUTH EVERY DAY, Disp: 90 tablet, Rfl: 0 .  HYDROcodone-homatropine (HYCODAN) 5-1.5 MG/5ML syrup, Take 5 mLs by mouth every 6 (six) hours as needed for cough., Disp: 240 mL, Rfl: 0 .  hydroxychloroquine (PLAQUENIL) 200 MG tablet, Take 200 mg by mouth 2 (two) times daily., Disp: , Rfl:  .  ondansetron (ZOFRAN) 4 MG tablet, Take 1 tablet (4 mg total) by mouth 3 (three) times daily as needed for nausea., Disp: 90 tablet, Rfl: 3 .  predniSONE (DELTASONE) 5 MG tablet, Take 5 mg by mouth daily with breakfast. , Disp: ,  Rfl:  .  traMADol (ULTRAM) 50 MG tablet, Take 50 mg by mouth every 6 (six) hours as needed., Disp: , Rfl:  .  voriconazole (VFEND) 200 MG tablet, TAKE 1 TABLET(200 MG) BY MOUTH TWICE DAILY, Disp: 60 tablet, Rfl: 2 .  Nintedanib (OFEV) 150 MG CAPS, Take 1 capsule (150 mg total) by mouth 2 (two) times daily. (Patient not taking: Reported on 02/14/2020), Disp: 180 capsule, Rfl: 1      Objective:   Vitals:   02/14/20 1145  BP: (!) 110/58  Pulse: 88  Temp: 98 F (36.7 C)  TempSrc: Oral  SpO2: 99%  Weight: 145 lb (65.8 kg)  Height: _0  (1.626 m)    Estimated body mass index is 24.89 kg/m as calculated from the following:   Height as of this encounter: _1  (1.626 m).   Weight as of this encounter: 145 lb (65.8 kg).  _2 @  Filed Weights   02/14/20 1145  Weight: 145 lb (65.8 kg)     Physical Exam No distress but she is in  tears. She has rheumatoid arthritis deformities in her hands. Rheumatoid nodules in elbow. She also has got the same in her feet. She has bilateral crackles. Normal heart sounds.         Assessment:       ICD-10-CM   1. Interstitial lung disease due to connective tissue disease (HCC)  J84.89 Pulse oximetry, overnight   M35.9 6 minute walk  2. Rheumatoid arthritis involving multiple joints (HCC)  M06.9   3. Hemoptysis  R04.2   4. Bronchiectasis with acute lower respiratory infection ([place])  J47.0   5. Change in financial circumstances  Z59.9   6. Encounter for disability examination  Z02.71    Am really surprised that she has not qualified for Social Security disability. She is got extremely challenging [facility] problems in addition her RA pain is completely disabling her. Her pulmonary fibrosis is slowly progressive over time especially on CT scan. She continues to have hemoptysis which is a sign of persistent infection with her bronchiectasis. This is from the aspergilloma. She is dealing with significant job loss after having worked 42 years.    Plan:     Patient Instructions  Hemoptysis  - mild and ongoing and due to below lung issues but maybe better after voricanazole restart on 10/26/2019 - completed bronch 11/27/19 - no new culture results  Plan  - no specific intervention -at his is your baseline  - any massive or major coughing up blood - go to ER   Pulmonary aspergilloma (HCC) Bronchiectasis with LRTI Iinfection of aspergilloma  -noted back on voricanazole  Plan  - per Dr Linus Salmons - continue voricanazole   Interstitial lung disease due to connective tissue disease ([place]) - UIP pattern, progresive  -off esbriet due to side effects - waiting for ofev - fev1 is < 70%, DLCO test is inaccurate today  Plan -list esbriet as allergy - await ofev to start - test ono on room air - test 6 min walk test - first availble will call with results  Rheumatoid arthritis  involving multiple joints ([place])  -This appears active given the onset of pulmonary nodules and new elbows and also interstitial lung disease in the lung and active joint pain  - Plan -plaquenil oer Dr Amil Amen -Keep your prednisone at 5 mg/day -Avoid biologic treatment for your rheumatoid arthritis but probably okay to start leflunomide   Financial Difficulties Disabiltuy eval  - noted. I think  you qualiufy for SSI dififculty from RA, and pulmonary stand point  Plan  - will do letter for you  - apply to medicaid as well  Follow-up - will call with 6 min walk resuls, ONO results and disability letter  - otherwise retrun in 8-12 weks for 30 min fae to face with Dr Chase Caller   ( Level 05 visit: Estb 40-54 min *  in  visit type: on-site physical face to visit  in total care time and counseling or/and coordination of care by this undersigned MD - Dr Brand Males. This includes one or more of the following on this same day 02/14/2020: pre-charting, chart review, note writing, documentation discussion of test results, diagnostic or treatment recommendations, prognosis, risks and benefits of management options, instructions, education, compliance or risk-factor reduction. It excludes time spent by the [place] or office staff in the care of the patient. Actual time 29 min)   SIGNATURE    Dr. Brand Males, M.D., F.C.C.P,  Pulmonary and Critical Care Medicine Staff Physician, [place] Director - Interstitial Lung Disease  Program  Pulmonary [place] at [place], [place], [address]  Pager: [phone], If no answer or between  15:00h - 7:00h: call [phone] Telephone: [phone]  12:36 PM 02/14/2020

## 2020-02-14 NOTE — Telephone Encounter (Signed)
This is a reminder to self to do disability letter

## 2020-02-20 NOTE — Telephone Encounter (Signed)
Spoke to [facility] case Production designer, theatre/television/film, they are working to update override to be updated to allow fill at the correct [facility] location. She will keep [place] updated to get shipment out for patient.  Called patient to update. She states she did not sign [employer] form at visit. Will place at [employer] and patient will come by office tomorrow to sign.

## 2020-02-21 NOTE — Telephone Encounter (Signed)
Received message from [facility] Case manager, they were able to correct override and got a paid claim. They will reach out to patient to schedule Ofev shipment.

## 2020-02-21 NOTE — Telephone Encounter (Signed)
Patient came by and signed Open doors forms, faxed them in. Will continue to follow up on 1st shipment of medication.

## 2020-02-22 NOTE — Telephone Encounter (Signed)
Spoke to patient, she was able to set up 1st Ofev shipment to deliver on 02/23/20 from [facility]. Advised her to call with any questions or concerns.

## 2020-02-27 ENCOUNTER — Telehealth: Payer: Self-pay | Admitting: Internal Medicine

## 2020-02-27 NOTE — Telephone Encounter (Signed)
Noted! Thank you

## 2020-02-28 ENCOUNTER — Other Ambulatory Visit: Payer: Self-pay

## 2020-02-28 ENCOUNTER — Encounter: Payer: Self-pay | Admitting: Internal Medicine

## 2020-02-28 ENCOUNTER — Ambulatory Visit (INDEPENDENT_AMBULATORY_CARE_PROVIDER_SITE_OTHER): Payer: 59 | Admitting: Internal Medicine

## 2020-02-28 VITALS — BP 121/71 | HR 86 | Temp 97.0°F | Wt 144.8 lb

## 2020-02-28 DIAGNOSIS — Z5181 Encounter for therapeutic drug level monitoring: Secondary | ICD-10-CM

## 2020-02-28 DIAGNOSIS — B449 Aspergillosis, unspecified: Secondary | ICD-10-CM

## 2020-02-28 DIAGNOSIS — J984 Other disorders of lung: Secondary | ICD-10-CM

## 2020-02-28 LAB — HEPATIC FUNCTION PANEL
AG Ratio: 1.1 (calc) (ref 1.0–2.5)
ALT: 24 U/L (ref 6–29)
AST: 32 U/L (ref 10–35)
Albumin: 3.7 g/dL (ref 3.6–5.1)
Alkaline phosphatase (APISO): 260 U/L — ABNORMAL HIGH (ref 37–153)
Bilirubin, Direct: 0 mg/dL (ref 0.0–0.2)
Globulin: 3.5 g/dL (calc) (ref 1.9–3.7)
Indirect Bilirubin: 0.2 mg/dL (calc) (ref 0.2–1.2)
Total Bilirubin: 0.2 mg/dL (ref 0.2–1.2)
Total Protein: 7.2 g/dL (ref 6.1–8.1)

## 2020-02-28 NOTE — Assessment & Plan Note (Addendum)
She is tolerating the voriconazole and I plan to continue through October and will consider rescanning with a new CT scan at that time.  She will return in 2 months.   Will write a letter of support as requested and it can be mailed to her.

## 2020-02-28 NOTE — Progress Notes (Signed)
Patient ID: Kimberly Shelton, female   DOB: [birth year], 59 y.o.   MRN: [id number] Patient ID: Kimberly Shelton, female   DOB: 01-Aug-1961, 59 y.o.   MRN: [id number]   Subjective:    Patient ID: Kimberly Shelton, female    DOB: March 25, 1961, 59 y.o.   MRN: [id number]  HPI Here for follow up for pulmonary cavitary lesions.  I saw her last year and noted bronchiectasis and had a positive aspergillus Ig positive with negative Ag and no growth on sputum culture.  She had been relatively asymptomatic.  She more recently though has developed hemoptysis and a follow up CT scan with cavitary areas.  I put her on voriconazole and she took this for 3 months.  A repeat CT scan showed some improvement.     She completed prolonged voriconazole but returned to Dr. [name] and had some progression of symptoms and follow up CT with mild progression.   I started her back on voriconazole and she is tolerating it well.   She has started another new medication via Dr. [name]. She is tolerating the voriconazole.    She is requesting a letter of support for her disability claim.     Review of Systems  Constitutional: Negative for chills and fever.  Respiratory: Negative for cough, shortness of breath, wheezing and stridor.   Skin: Negative for rash.       Objective:   Physical Exam Constitutional:      Appearance: Normal appearance.  Eyes:     General: No scleral icterus. Pulmonary:     Effort: Pulmonary effort is normal. No respiratory distress.  Skin:    Findings: No rash.  Neurological:     Mental Status: She is alert.  Psychiatric:        Mood and Affect: Mood normal.    SH: not vaping       Assessment & Plan:

## 2020-02-28 NOTE — Assessment & Plan Note (Signed)
Will check a hepatic panel on the voriconazole

## 2020-02-28 NOTE — Assessment & Plan Note (Signed)
On treatment for the infectious component as above.

## 2020-03-20 ENCOUNTER — Other Ambulatory Visit: Payer: Self-pay | Admitting: Obstetrics & Gynecology

## 2020-03-20 DIAGNOSIS — Z1231 Encounter for screening mammogram for malignant neoplasm of breast: Secondary | ICD-10-CM

## 2020-03-25 ENCOUNTER — Telehealth: Payer: Self-pay | Admitting: Internal Medicine

## 2020-03-25 NOTE — Telephone Encounter (Signed)
I do not see that a letter was done yet  [facility] for the pt and will forward to MR to check the status of this  I see he mentions this in another phone note dated 02/14/20

## 2020-03-26 NOTE — Telephone Encounter (Signed)
Pt returning a phone call. Pt can be reached at [phone].

## 2020-03-26 NOTE — Telephone Encounter (Signed)
Called patient and made her aware we are awaiting MR response and will get back with her asap.  MR please advise on letter.

## 2020-04-01 NOTE — Telephone Encounter (Signed)
Still waiting on a response from MR about the disability letter. MR, please advise on the letter and what needs to be stated in the letter for pt.

## 2020-04-03 ENCOUNTER — Encounter: Payer: Self-pay | Admitting: *Deleted

## 2020-04-03 NOTE — Telephone Encounter (Signed)
To WHom It May Concern Re: Kimberly Shelton  With DOB [birth year] and address [place] [phone]   Re: disability application for [employer]    #bronchiectasis with complication by fibrocavitary lung disease and persistent infection with aspergilloma  #hemoptysis             - suffered in 2015 following severe pneumococcal pneumonia in the setting of immunosuppression from rheumatoid arthritis             - Course complicated by aspergilloma in the right upper lobe in the setting of pulmonary cavity-diagnosis made by positive IgG             - sp bronch 11/27/19  BAL RUL - 94% polus, - culture negative             = normal ENT [facility] 11/20/19 Dr Lucia Gaskins              -followed by Dr. Linus Salmons  #ILD/UIP-pulmonary fibrosis             - progressive UIP pattern - on HRCt April 2019 -> 2021 Jan             - started esbriet approx 12/10/19 -stopped May 2021 due to GI side effects             -Awaiting nintedanib and June 2021.  #small hiatl hernia  #coronary artery calcification and baseline heart murmur - dr Angelena Form  #RA             - Dr Amil Amen              - plaquenil                   - prednismoe 5 mg             -Severe pain             -Unable to take immunosuppressive drugs because of respiratory infection.     Results for LAMYIAH, CRAWSHAW (MRN [id number]) as of 04/03/2020 08:51  Ref. Range 02/10/2015 13:49 01/27/2018 09:57 08/11/2018 08:53 02/13/2020 14:55  FVC-Pre Latest Units: L 2.42 2.22 2.26 2.20  FVC-%Pred-Pre Latest Units: % 73 68 69 68  Results for MODESTA, SAMMONS (MRN [id number]) as of 04/03/2020 08:51  Ref. Range 02/10/2015 13:49 01/27/2018 09:57 08/11/2018 08:53 02/13/2020 14:55  DLCO unc Latest Units: ml/min/mmHg 18.50 18.02 15.53 16.24  DLCO unc % pred Latest Units: % 80 78 67 83    The above-named patient has the above medical problems.  All these medical problems are pulmonary fibrosis is of UIP pattern.  This is because of  rheumatoid arthritis.  A UIP pattern is a progressive type of pulmonary fibrosis that results in death and disability within a few to several years of diagnosis.  In her particular situation this is because of her rheumatoid arthritis.  Prior to the diagnosis of this type of pulmonary fibrosis because of immunosuppression in 2015 she suffered severe pneumococcal pneumonia that left her lungs destroyed with fibrocavitary disease.  This then subsequently got infected with fungal ball called aspergilloma.  At this point in time because of the presence of fungal ball she is not able to handle joint saving and joint disability preventing DMA RD treatment for rheumatoid arthritis.  This is because the immunosuppressive medications that can make her lung disease infected with aspergilloma worse.  She is already having hemoptysis which is coughing up  of blood.  This can any date and life-threatening and kill her.  Therefore she is contending with severe disabling pain from her rheumatoid arthritis without the option to get good treatment.  In addition she is now also dealing with a severely immunosuppressed status with fungal ball that can turn into life-threatening hemoptysis at any time.  She is already having early warning signs of this.  On top of this she also has pulmonary fibrosis that is already progressing.  She not able to tolerate treatment for that either.  Overall this patient is severely disabled and in the next few to several years will very likely die from her medical problems.  At this point in time when I saw her she was in severe pain from her rheumatoid arthritis and severe joint disability.  Her effort tolerance has been limited because of pulmonary fibrosis as well.  Therefore I do not understand why her disability application has been denied.  If there is one patient who is worthy of obtaining disability benefits it is her.  I honestly believe it is inhumane to deny disability benefits for this  patient KAMARIA LUCIA   Please do not hesitate to contact me if you have any further questions   Sincerely yours      SIGNATURE    Dr. Brand Males, M.D., F.C.C.P,  Pulmonary and Critical Care Medicine Staff Physician, [place] Director - Interstitial Lung Disease  Program  Pulmonary [place] at [place], [place], [address] Telephone (clinical office): [phone]   8:57 AM 04/03/2020

## 2020-04-03 NOTE — Telephone Encounter (Signed)
Pt called about this again. Please call back.

## 2020-04-03 NOTE — Telephone Encounter (Signed)
Letter printed, signed Spoke with the pt and she will pick up today  Letter placed up front  Nothing further needed

## 2020-04-09 ENCOUNTER — Telehealth: Payer: Self-pay | Admitting: Internal Medicine

## 2020-04-09 MED ORDER — HYDROCODONE-HOMATROPINE 5-1.5 MG/5ML PO SYRP: 5.0000 mL | ORAL_SOLUTION | Freq: Four times a day (QID) | ORAL | 0 refills | Status: DC | PRN

## 2020-04-09 NOTE — Telephone Encounter (Signed)
Patient called and message left that request was forwarded to provider. Hycodan refill request, last ordered 12/11/2019, 240 ml, 5 ml every six hours prn. Please refill/advise.

## 2020-04-09 NOTE — Telephone Encounter (Signed)
Kimberly Shelton Dr.Ramaswamy is on vacation until Aug 16th. Not sure that he will get the message until then or after. Do you still want [place] to send message to him for refill?

## 2020-04-09 NOTE — Telephone Encounter (Signed)
Patient contacted that refill was sent to preferred pharmacy.

## 2020-04-09 NOTE — Telephone Encounter (Signed)
Ok I did not know that. I will refill once, then needs to come from him

## 2020-04-09 NOTE — Telephone Encounter (Signed)
Send to MR for refill, not urgent. Thanks [facility]

## 2020-04-16 ENCOUNTER — Other Ambulatory Visit: Payer: Self-pay

## 2020-04-16 ENCOUNTER — Ambulatory Visit (INDEPENDENT_AMBULATORY_CARE_PROVIDER_SITE_OTHER): Payer: 59

## 2020-04-16 DIAGNOSIS — J8489 Other specified interstitial pulmonary diseases: Secondary | ICD-10-CM | POA: Diagnosis not present

## 2020-04-16 DIAGNOSIS — M359 Systemic involvement of connective tissue, unspecified: Secondary | ICD-10-CM

## 2020-04-16 NOTE — Progress Notes (Signed)
Six Minute Walk - 04/16/20 1000      Six Minute Walk   Medications taken before test (dose and time) lexapro 10mg  and voriconazole 200mg  at 8am    Supplemental oxygen during test? No    Lap distance in meters  34 meters    Laps Completed 14    Partial lap (in meters) 0 meters    Baseline BP (sitting) 110/60    Baseline Heartrate 67    Baseline Dyspnea (Borg Scale) 3    Baseline Fatigue (Borg Scale) 2    Baseline SPO2 100 %      End of Test Values    BP (sitting) 120/80    Heartrate 97    Dyspnea (Borg Scale) 3    Fatigue (Borg Scale) 2    SPO2 96 %      2 Minutes Post Walk Values   BP (sitting) 114/70    Heartrate 80    SPO2 99 %    Stopped or paused before six minutes? No    Other Symptoms at end of exercise: Hip pain   chest tightness which were not new symptoms with that amount of walking     Interpretation   Distance completed 476 meters    Tech Comments: pt walked at a moderate pace talking throughout the walk. pt stated she was becoming a little winded but did not stop walking. pt completed entire 6 min without stopping. had complaints of hip pain and chest tightness which were not new symptoms.

## 2020-04-21 ENCOUNTER — Other Ambulatory Visit: Payer: Self-pay

## 2020-04-21 ENCOUNTER — Ambulatory Visit
Admission: RE | Admit: 2020-04-21 | Discharge: 2020-04-21 | Disposition: A | Discharge status: Home / Self Care | Payer: 59 | Source: Ambulatory Visit | Attending: Obstetrics & Gynecology | Admitting: Obstetrics & Gynecology

## 2020-04-21 DIAGNOSIS — Z1231 Encounter for screening mammogram for malignant neoplasm of breast: Secondary | ICD-10-CM

## 2020-04-24 ENCOUNTER — Telehealth: Payer: Self-pay | Admitting: Internal Medicine

## 2020-04-24 NOTE — Telephone Encounter (Signed)
lmtcb for pt.  

## 2020-04-24 NOTE — Telephone Encounter (Signed)
Patient calling  Wants to give a heads up to nurse letting them know the disability will be contacting and asking for the recent records for patient including 41mw. And if Dr. [name] needs to add anything to her chart and records about her lung condition to do so.    Will route to [facility] to follow up on and look for paperwork and to MR as fYI

## 2020-04-24 NOTE — Telephone Encounter (Signed)
Right now her records are uptodate. I have put in all the needed information for disability

## 2020-04-24 NOTE — Telephone Encounter (Signed)
Pt returning a phone. Pt can be reached at [phone].

## 2020-04-25 NOTE — Telephone Encounter (Signed)
Called and spoke with pt letting her know the info stated by MR and she verbalized understanding. Nothing further needed. 

## 2020-04-30 ENCOUNTER — Other Ambulatory Visit: Payer: Self-pay

## 2020-04-30 ENCOUNTER — Encounter: Payer: Self-pay | Admitting: Internal Medicine

## 2020-04-30 ENCOUNTER — Telehealth (INDEPENDENT_AMBULATORY_CARE_PROVIDER_SITE_OTHER): Payer: 59 | Admitting: Internal Medicine

## 2020-04-30 DIAGNOSIS — B449 Aspergillosis, unspecified: Secondary | ICD-10-CM | POA: Diagnosis not present

## 2020-04-30 NOTE — Progress Notes (Signed)
Patient ID: SOFIA VANMETER, female   DOB: 1961/02/22, 58 y.o.   MRN: [id number] Patient ID: KATRYNA TSCHIRHART, female   DOB: Jun 26, 1961, 59 y.o.   MRN: [id number]   Subjective:    Patient ID: TENYA ARAQUE, female    DOB: [birth year], 59 y.o.   MRN: [id number]   I connected with  JULIAN ASKIN on 04/30/20 by phone and verified that I am speaking with the correct person using two identifiers.   I discussed the limitations of evaluation and management by telemedicine. The patient expressed understanding and agreed to proceed.  10 minutes spent on the call  Location Patient: home Physician: clinic  HPI Here for follow up for pulmonary cavitary lesions.  I saw her last year and noted bronchiectasis and had a positive aspergillus Ig positive with negative Ag and no growth on sputum culture.  She had been relatively asymptomatic.  She more recently though has developed hemoptysis and a follow up CT scan with cavitary areas.  I put her on voriconazole and she took this for 3 months.  A repeat CT scan showed some improvement.     She completed prolonged voriconazole but returned to Dr. [name] and had some progression of symptoms and follow up CT with mild progression.   I started her back on voriconazole and she is tolerating it well.   She does complain of continued small amount of dark blood in her sputum in some mornings when she gets up.     Review of Systems  Constitutional: Negative for chills and fever.  Respiratory: Negative for cough, shortness of breath, wheezing and stridor.   Skin: Negative for rash.       Objective:   Physical Exam Pulmonary:     Effort: Pulmonary effort is normal.  Neurological:     Mental Status: She is alert.  Psychiatric:        Mood and Affect: Mood normal.          Assessment & Plan:

## 2020-04-30 NOTE — Assessment & Plan Note (Signed)
She is doing well with the voriconazole and no concerns.  Will have her continue for another 2 months and consider stopping at the next visit and observe off of antibiotics then.   She will discuss with Dr. [name] further about the persistent hemoptysis.

## 2020-05-02 ENCOUNTER — Telehealth: Payer: Self-pay | Admitting: Internal Medicine

## 2020-05-02 NOTE — Telephone Encounter (Signed)
Will follow up once we have received the information from Disability dept. Routing to [place] as an FYI that this is being faxed over.

## 2020-05-02 NOTE — Telephone Encounter (Signed)
Per chart we have not received any disability forms for this patient.  All disability forms must come through [employer] per office protocol and are initially documented by [facility].   Spoke with pt to make her aware that we have not received anything re: her disability at this time, and advised her where forms would need to be sent.  Pt expressed understanding, will reach out to her disability coordinator to get forms sent in accordingly.  Will await forms.

## 2020-05-02 NOTE — Progress Notes (Signed)
59 y.o. G1P1 Divorced White or Caucasian female here for annual exam.  Denies vaginal bleeding,  H/o RA, on chronic immunosuppressants.  Has been diagnosed with aspergillosis.  Has applied for disability.    Followed by Dr. Dierdre Forth, her rheumatologist.  She's on plaquenil and prednisone 2.5mg  daily.    Dr. Comer--ID.  Is on Nintedanib and voriconazole.  Has virtual visit 04/23/2019.    Patient's last menstrual period was 09/06/2004.          Sexually active: No.  The current method of family planning is post menopausal status.    Exercising: Yes.    bike Smoker:  no  Health Maintenance: Pap:  11-24-17 ASCUS HPV HR neg, 05-03-2019 neg HPV HRneg History of abnormal Pap:  yes MMG:  04-22-2020 category c density birads 1:neg Colonoscopy:  2016 f/u 65yrs BMD:   2015 TDaP:  2017 Pneumonia vaccine(s):  2019 Shingrix:   D/w pt today Hep C testing: neg 2018 Screening Labs: done in April   reports that she quit smoking about 6 years ago. Her smoking use included cigarettes and e-cigarettes. She started smoking about 36 years ago. She has a 30.00 pack-year smoking history. She has never used smokeless tobacco. She reports that she does not drink alcohol and does not use drugs.  Past Medical History:  Diagnosis Date  . Abnormal Pap smear 09/2006   HGSIL CIN 2/VAIN/ CIN 3/VAIN-3 CIS  . CHF (congestive heart failure) (HCC)   . Foot fracture, left 10/16   hair-line fracture  . Pneumonia    was hospitalized  . RA (rheumatoid arthritis) (HCC) 07/2009    Past Surgical History:  Procedure Laterality Date  . BRONCHIAL WASHINGS  11/27/2019   Procedure: BRONCHIAL WASHINGS;  Surgeon: Kalman Shan, MD;  Location: [facility];  Service: Cardiopulmonary;;  . CERVICAL BIOPSY  W/ LOOP ELECTRODE EXCISION  2008   CIN 3  . CERVICAL CONIZATION W/BX N/A 12/15/2015   Procedure: CONIZATION CERVIX WITH BIOPSY ;  Surgeon: Jerene Bears, MD;  Location: [facility];  Service: Gynecology;  Laterality: N/A;   POSSIBLE COLD KNIFE CONE.  Please have the microscope in the room  . COLONOSCOPY    . LEEP N/A 12/15/2015   Procedure: LOOP ELECTROSURGICAL EXCISION PROCEDURE (LEEP) with colpo;  Surgeon: Jerene Bears, MD;  Location: [facility];  Service: Gynecology;  Laterality: N/A;  . PILONIDAL CYST EXCISION  1999   I&D  . VIDEO BRONCHOSCOPY N/A 11/27/2019   Procedure: VIDEO BRONCHOSCOPY WITHOUT FLUORO;  Surgeon: Kalman Shan, MD;  Location: [facility];  Service: Cardiopulmonary;  Laterality: N/A;  . WISDOM TOOTH EXTRACTION      Current Outpatient Medications  Medication Sig Dispense Refill  . albuterol (VENTOLIN HFA) 108 (90 Base) MCG/ACT inhaler INHALE 2 PUFFS INTO THE LUNGS EVERY 6 HOURS AS NEEDED FOR WHEEZING OR SHORTNESS OF BREATH (Patient taking differently: Inhale 2 puffs into the lungs every 6 (six) hours as needed for wheezing or shortness of breath. ) 8.5 g 3  . escitalopram (LEXAPRO) 10 MG tablet TAKE 1 TABLET BY MOUTH EVERY DAY 90 tablet 0  . hydroxychloroquine (PLAQUENIL) 200 MG tablet Take 200 mg by mouth 2 (two) times daily.    . Nintedanib (OFEV) 150 MG CAPS Take 1 capsule (150 mg total) by mouth 2 (two) times daily. 180 capsule 1  . ondansetron (ZOFRAN) 4 MG tablet Take 1 tablet (4 mg total) by mouth 3 (three) times daily as needed for nausea. 90 tablet 3  . predniSONE (DELTASONE) 5  MG tablet Take 2.5 mg by mouth daily with breakfast.     . voriconazole (VFEND) 200 MG tablet TAKE 1 TABLET(200 MG) BY MOUTH TWICE DAILY 60 tablet 2  . benzonatate (TESSALON) 200 MG capsule TAKE 1 CAPSULE(200 MG) BY MOUTH THREE TIMES DAILY AS NEEDED FOR COUGH (Patient not taking: Reported on 05/05/2020) 90 capsule 3  . EPINEPHrine (EPIPEN 2-PAK) 0.3 mg/0.3 mL IJ SOAJ injection Inject 0.3 mg into the muscle as needed for anaphylaxis. (Patient not taking: Reported on 05/05/2020)    . HYDROcodone-homatropine (HYCODAN) 5-1.5 MG/5ML syrup Take 5 mLs by mouth every 6 (six) hours as needed for cough. (Patient not  taking: Reported on 05/05/2020) 240 mL 0  . traMADol (ULTRAM) 50 MG tablet Take 50 mg by mouth every 6 (six) hours as needed. (Patient not taking: Reported on 05/05/2020)     No current facility-administered medications for this visit.    Family History  Problem Relation Age of Onset  . Diabetes Mother   . Rheum arthritis Mother   . Diabetes Father   . COPD Father   . Bronchitis Father   . Cancer Brother        Lung  . Breast cancer Maternal Aunt 47  . CAD Neg Hx     Review of Systems  Constitutional: Negative.   HENT: Negative.   Eyes: Negative.   Respiratory: Negative.   Cardiovascular: Negative.   Gastrointestinal: Negative.   Endocrine: Negative.   Genitourinary: Negative.   Musculoskeletal: Negative.   Skin: Negative.   Allergic/Immunologic: Negative.   Neurological: Negative.   Hematological: Negative.   Psychiatric/Behavioral: Negative.     Exam:   BP 110/68   Pulse 68   Resp 16   Ht 5' 2.75" (1.594 m)   Wt 142 lb (64.4 kg)   LMP 09/06/2004   BMI 25.36 kg/m   Height: 5' 2.75" (159.4 cm)  General appearance: alert, cooperative and appears stated age Head: Normocephalic, without obvious abnormality, atraumatic Neck: no adenopathy, supple, symmetrical, trachea midline and thyroid normal to inspection and palpation Lungs: clear to auscultation bilaterally Breasts: normal appearance, no masses or tenderness Heart: regular rate and rhythm Abdomen: soft, non-tender; bowel sounds normal; no masses,  no organomegaly Extremities: extremities normal, atraumatic, no cyanosis or edema Skin: Skin color, texture, turgor normal. No rashes or lesions Lymph nodes: Cervical, supraclavicular, and axillary nodes normal. No abnormal inguinal nodes palpated Neurologic: Grossly normal   Pelvic: External genitalia:  no lesions              Urethra:  normal appearing urethra with no masses, tenderness or lesions              Bartholins and Skenes: normal                  Vagina: normal appearing vagina with normal color and discharge, no lesions              Cervix: no lesions              Pap taken: No. Bimanual Exam:  Uterus:  normal size, contour, position, consistency, mobility, non-tender              Adnexa: normal adnexa and no mass, fullness, tenderness               Rectovaginal: Confirms               Anus:  normal sphincter tone, no lesions  Chaperone, Delton Coombes, CMA, was  present for exam.  A:  Well Woman with normal exam PMP, no HRT H/o CIN 2 with LEEP 2008, the nagain 4/17 with CIN 2/3 RA, on prednisone Aspergillus in lung, on antifungal medication, followed by Dr. Luciana Axe  P:   Mammogram guidelines reviewed  pap smear with HR HPV obtained today BMD order placed today Lab work and vaccines reviewed.  Declines shingrix for now.  She is going to get the Covid booster.   Colonoscopy 2016, follow up 10 years. return annually or prn

## 2020-05-05 ENCOUNTER — Ambulatory Visit (INDEPENDENT_AMBULATORY_CARE_PROVIDER_SITE_OTHER): Payer: 59 | Admitting: Obstetrics & Gynecology

## 2020-05-05 ENCOUNTER — Encounter: Payer: Self-pay | Admitting: Obstetrics & Gynecology

## 2020-05-05 ENCOUNTER — Other Ambulatory Visit: Payer: Self-pay

## 2020-05-05 ENCOUNTER — Other Ambulatory Visit (HOSPITAL_COMMUNITY)
Admission: RE | Admit: 2020-05-05 | Discharge: 2020-05-05 | Disposition: A | Discharge status: Home / Self Care | Payer: 59 | Source: Ambulatory Visit | Attending: Obstetrics & Gynecology | Admitting: Obstetrics & Gynecology

## 2020-05-05 VITALS — BP 110/68 | HR 68 | Resp 16 | Ht 62.75 in | Wt 142.0 lb

## 2020-05-05 DIAGNOSIS — M8580 Other specified disorders of bone density and structure, unspecified site: Secondary | ICD-10-CM

## 2020-05-05 DIAGNOSIS — D069 Carcinoma in situ of cervix, unspecified: Secondary | ICD-10-CM

## 2020-05-05 DIAGNOSIS — Z01419 Encounter for gynecological examination (general) (routine) without abnormal findings: Secondary | ICD-10-CM | POA: Diagnosis not present

## 2020-05-05 DIAGNOSIS — Z124 Encounter for screening for malignant neoplasm of cervix: Secondary | ICD-10-CM | POA: Diagnosis not present

## 2020-05-06 NOTE — Telephone Encounter (Signed)
Social Security disability records do not get handled in the offices - they need to go straight to medical records. If I receive anything I will send to medical records for processing -pr

## 2020-05-07 LAB — CYTOLOGY - PAP
Comment: NEGATIVE
Diagnosis: NEGATIVE
Diagnosis: REACTIVE
High risk HPV: NEGATIVE

## 2020-05-13 ENCOUNTER — Other Ambulatory Visit: Payer: Self-pay | Admitting: Obstetrics & Gynecology

## 2020-05-13 DIAGNOSIS — F32A Depression, unspecified: Secondary | ICD-10-CM

## 2020-05-13 DIAGNOSIS — Z124 Encounter for screening for malignant neoplasm of cervix: Secondary | ICD-10-CM

## 2020-05-13 NOTE — Telephone Encounter (Signed)
Medication refill request: Escitalopram 10mg  Last AEX:  05/05/20 Next AEX: not scheduled Last MMG (if hormonal medication request): 04/21/20 neg  Refill authorized: 90/0

## 2020-05-13 NOTE — Telephone Encounter (Signed)
lmtcb for pt. She will need to call medical records at [phone].

## 2020-05-15 ENCOUNTER — Encounter: Payer: Self-pay | Admitting: Internal Medicine

## 2020-05-15 ENCOUNTER — Ambulatory Visit (INDEPENDENT_AMBULATORY_CARE_PROVIDER_SITE_OTHER): Payer: 59 | Admitting: Internal Medicine

## 2020-05-15 ENCOUNTER — Other Ambulatory Visit: Payer: Self-pay

## 2020-05-15 ENCOUNTER — Telehealth: Payer: Self-pay | Admitting: Internal Medicine

## 2020-05-15 ENCOUNTER — Other Ambulatory Visit: Payer: Self-pay | Admitting: Internal Medicine

## 2020-05-15 VITALS — BP 116/72 | HR 76 | Temp 97.7°F | Ht 63.0 in | Wt 144.0 lb

## 2020-05-15 DIAGNOSIS — Z7185 Encounter for immunization safety counseling: Secondary | ICD-10-CM

## 2020-05-15 DIAGNOSIS — Z7189 Other specified counseling: Secondary | ICD-10-CM

## 2020-05-15 DIAGNOSIS — M069 Rheumatoid arthritis, unspecified: Secondary | ICD-10-CM

## 2020-05-15 DIAGNOSIS — J8489 Other specified interstitial pulmonary diseases: Secondary | ICD-10-CM | POA: Diagnosis not present

## 2020-05-15 DIAGNOSIS — D849 Immunodeficiency, unspecified: Secondary | ICD-10-CM

## 2020-05-15 DIAGNOSIS — R042 Hemoptysis: Secondary | ICD-10-CM | POA: Diagnosis not present

## 2020-05-15 DIAGNOSIS — B449 Aspergillosis, unspecified: Secondary | ICD-10-CM

## 2020-05-15 DIAGNOSIS — Z5181 Encounter for therapeutic drug level monitoring: Secondary | ICD-10-CM | POA: Diagnosis not present

## 2020-05-15 DIAGNOSIS — M359 Systemic involvement of connective tissue, unspecified: Secondary | ICD-10-CM

## 2020-05-15 DIAGNOSIS — J849 Interstitial pulmonary disease, unspecified: Secondary | ICD-10-CM

## 2020-05-15 LAB — HEPATIC FUNCTION PANEL
ALT: 15 U/L (ref 0–35)
AST: 15 U/L (ref 0–37)
Albumin: 4 g/dL (ref 3.5–5.2)
Alkaline Phosphatase: 188 U/L — ABNORMAL HIGH (ref 39–117)
Bilirubin, Direct: 0.1 mg/dL (ref 0.0–0.3)
Total Bilirubin: 0.3 mg/dL (ref 0.2–1.2)
Total Protein: 7.9 g/dL (ref 6.0–8.3)

## 2020-05-15 LAB — CBC
HCT: 36.7 % (ref 36.0–46.0)
Hemoglobin: 12 g/dL (ref 12.0–15.0)
MCHC: 32.7 g/dL (ref 30.0–36.0)
MCV: 87.2 fl (ref 78.0–100.0)
Platelets: 357 10*3/uL (ref 150.0–400.0)
RBC: 4.21 Mil/uL (ref 3.87–5.11)
RDW: 14.4 % (ref 11.5–15.5)
WBC: 11.6 10*3/uL — ABNORMAL HIGH (ref 4.0–10.5)

## 2020-05-15 NOTE — Telephone Encounter (Signed)
Order for interventional radiology clinic has been placed in pt chart

## 2020-05-15 NOTE — Addendum Note (Signed)
Addended by: Luna Kitchens D on: 05/15/2020 11:20 AM   Modules accepted: Orders

## 2020-05-15 NOTE — Addendum Note (Signed)
Addended by: Demetrio Lapping E on: 05/15/2020 11:28 AM   Modules accepted: Orders

## 2020-05-15 NOTE — Telephone Encounter (Signed)
pls let patient know sometime next few days- if not already informed.

## 2020-05-15 NOTE — Patient Instructions (Addendum)
Hemoptysis  - mild and ongoing and due to below lung issues despite  voricanazole restart on 10/26/2019 - completed bronch 11/27/19 - no new culture results  Plan  - no specific intervention but will check with interventional radiology if you are a candidate for clot treatment  - any massive or major coughing up blood - go to ER   Pulmonary aspergilloma (HCC) Bronchiectasis with LRTI Iinfection of aspergilloma  -noted back on voricanazole  Plan  - per Dr Luciana Axe - continue voricanazole   Interstitial lung disease due to connective tissue disease (HCC) - UIP pattern, progresive Encounter for therapeutic monitoring  -off esbriet due to side effects -6-minute walk test summer 2021 without desaturation -On nintedanib since late July 2021 -Tolerating nintedanib okay except for some mild occasional vomiting  Plan -list esbriet as allergy -Continue nintedanib 150 mg twice daily -Check CBC, chemistry and liver function test as part of therapeutic monitoring -today 2020-05-15   Rheumatoid arthritis involving multiple joints (HCC)  -This appears active given the onset of pulmonary nodules and new elbows and also interstitial lung disease in the lung and active joint pain  - Plan -plaquenil oer Dr Dierdre Forth -Keep your prednisone at 5 mg/day -Rest of treatment per Dr. Dierdre Forth  Financial Difficulties Disabiltuy eval  -Noted that you are waiting judgments from Social Security about your disability application - you have severe RA - you are unable to flex your fingers well enoough to make a fist, fingers appear too deformed to be able to hold things well  Plan  - apply to medicaid as well  Vaccine counseling in a immunosuppressed status patient  -Because you're immunosuppressed re you commend third shot booster for COVID-19 vaccine -Recommend flu shot 05/15/2020   Follow-up -Repeat liver function test in 4 weeks and we can call you with the result -Return in 12 weeks to see Dr.  [name] on a 30-minute slot face-to-face visit

## 2020-05-15 NOTE — Telephone Encounter (Signed)
Kimberly Shelton,  Re Kimberly Shelton  -after she left the office I did spoke to Dr. Malachy Moan of interventional radiology.  He says the patient should be referred to interventional radiology clinic for preprocedure evaluation for potential IR guided embolization of the right upper lobe lung cavity because of persistent hemoptysis and a high risk status.   The clinic phone number is[facility] clini [phone] -   In your documentation please do note that  " Dr. [name] discussed with Dr. Malachy Moan"

## 2020-05-15 NOTE — Telephone Encounter (Signed)
Called and spoke to pt. Pt states she was seen by MR today and had a release form completed and was faxed by our office to medical records. Pt wanted to be sure this happens today.   Kimberly Shelton, pt states you were helping her with this. Please advise if this can be done today. Thanks!

## 2020-05-15 NOTE — Progress Notes (Addendum)
OV 03/04/2014 Chief Complaint  Patient presents with  . Follow-up    HFU-D/c from [facility]. Pt states she has chest soreness from coughing. Pt states with the O2 her breathing has improved. C/o dyspnea when working with PT and cough with yellow mucous.    Admitted 02/16/2014 through 02/24/2014 with right-sided pneumococcal pneumonia with cavitation in the setting of smoking and rheumatoid arthritis on immune modulators. At this point in time she is off the immunomodulators. Dr. Amil Amen her rheumatologist is aware of this. She is 25% back to her baseline but still very fatigued and short of breath. She coughs a lot with home physical therapy. She brings up green mucus and sputum with her cough needed overall cough is significantly improved since discharge. Appetite is slowly returning. She works as a Art gallery manager at [employer] and does not want to go to work till she is fully off oxygen. She is requesting to stay off work for several weeks. On 2 L there is documentation that when she walks 2 minutes up saturation still stays at 95%.   Today in the office pon room air at rest t it is 93%. Walked 40 feet and desaturated to 89% but HR 150/min per tech: I think this is all deconditioning but not sure if she has cardiomyopathy from her illness   04/02/14 Follow up  Pt returns for follow up from recent sever critical illness from streptococcal PNA , Bacteremia, sepsis 02/2014.   She had a right-sided pneumococcal pneumonia with cavitation in the setting of smoking and rheumatoid arthritis on immune modulators Today PFT show FEV1 61% , ratio 87  , FVC 55 %  , NO sign BD response, Diffusing capacity  Decreased 57% Has not smoked.  Wants to return to work soon.  CXR shows persistent RUL/LLL cavitary lesions .  Says she is feeling so much better.  No fever, chest pain, orthopnea, edema or n/v/d.  Good appetite . Seen by cards for tachycardia felt secondary to recent PN   OV 05/01/2014  Chief  Complaint  Patient presents with  . Follow-up    Pt states her breathing has improved since last OV. Pt c/o prod cough with yellow and clear mucous and lower mid lumbar pain, pt thinks d/t RA. Pt doing duoneb only once a day.  Pt denies SOB.     Admitted 02/16/2014 through 02/24/2014 with right-sided pneumococcal pneumonia with cavitation in the setting of smoking and rheumatoid arthritis on immune modulators. Current followup is for the same   S: Presents with husband. Feels great overall. No fatigute. No dyspnea. Able to do all ADLs. Wants to return to work now and is asking for release to work. In terms of RA, she feels early morning stiffness of hand joint is returning. SHe is seeing Dr Amil Amen 05/01/14 and will discuss with him about return to immunomodulators. There are no other issues. CXR done after she left and at time of this note:    Dg Chest 2 View  04/30/2014   CLINICAL DATA:  Followup cavitary pneumonia  EXAM: CHEST  2 VIEW  COMPARISON:  04/02/2014  FINDINGS: Normal heart size, mediastinal contours, and pulmonary vascularity.  Patchy infiltrates are identified in RIGHT upper lobe and LEFT lower lobe consistent with pneumonia.  Cavitary foci identified previously in the in both the RIGHT upper and LEFT lower lobes are still seen, the less evident in the RIGHT upper lobe.  Scattered interstitial prominence again identified.  No gross pleural  effusion or pneumothorax.  Bones unremarkable.  IMPRESSION: Persistent infiltrates are identified in the RIGHT upper lobe and LEFT lower lobe with persistent visualization of cavitary foci.   Electronically Signed   By: Lavonia Dana M.D.   On: 04/30/2014 19:09      OV 08/09/2014  Chief Complaint  Patient presents with  . Follow-up    Pt here after CT scan. Pt denies change in breathing since last OV. Pt c/o prod cough with clear and green mucus.     Presents for Pneumococcal pneumonia followup that developed in setting of RA and Simponi  She  is being maintained on ARAVA since her pneumococcal pneumonia earlier in 2013.  Now she is free of symptoms of respiratory tree. CT 08/08/14 shows further improvement but has architectural distortion. HEr RA is now significantly  active - per her and my talking 08/09/2014 with Dr Amil Amen. Patient is very apprehensive of starting ORENCIA recommended by Dr Amil Amen due to increased aecopd risk (She does not have copd), recurrent infection esp in setting of focal archiectrual distortion and recent pneumonia. She is very scared of dealing with RA disability v infection risk   Social and past and family: Mom had RA. She is upset abotu her disease  CT 08/08/14  IMPRESSION: 1. Emphysema with scattered scarring, volume loss, scattered bulla (some of which are still inflamed and with a somewhat cavitary appearance), and bilateral airway thickening. Much of this represents residua from prior multi lobar cavitary pneumonia, and is improved compared to the prior July CT scan. Given the degree of architectural distortion, scattered nodularity and opacities, and and irregular scarring, CT is not able to clear the lung of the possibility of malignancy, and surveillance imaging may be warranted.   Electronically Signed  By: Sherryl Barters M.D.  On: 08/08/2014 15:32  OV 11/06/2014  Chief Complaint  Patient presents with  . Follow-up    Coughing up whitish colored phlegm, SOB with walking, not always, sometimes.      Presents for Pneumococcal pneumonia followup that developed in setting of RA and Simponi June 2015, with resultant architectural distortion in her lung anatomy  Last seen December 2015. Since then overall she's doing well. At baseline she only has mild cough with occasional white sputum. She also has exertional dyspnea when she climbs up a hill but clearly in cold air he did this is relieved by rest. She is currently on 2 immunomodulators for her rheumatoid arthritis Orencia and Arava and  this is helping her joints. She's not had any respiratory complications so far. I notice that she is not on Bactrim prophylaxis but she is concerned about the side effects. And she wants to talk to her rheumatologist Dr. Amil Amen about this  Past, Family, Social reviewed: no change since last visit   OV 02/10/2015  Chief Complaint  Patient presents with  . Follow-up    Pt stated her breathing feels it has improved since last OV. Pt here after PFT. Pt stated she has a little more trouble breathing when it is hot out. Pt c/o mild prod cough with clear and small light green mucus. Pt denies CP/tightness.    Follow-up chronic destructive pulmonary to changes following pneumococcal pneumonia in 2015 in the setting of previous heavy smoking and immunomodulatory treatment for rheumatoid arthritis   Since seeing me last in March 2016 she feels that the albuterol inhaler that I tried for dyspnea and exertion and wheezing has helped. Dyspnea still persists. It is unchanged. It is  mild. However it is improved with albuterol which she takes prior to exertion. There is no associated cough or. Wheezing is improved. No hemoptysis no fever no chills .Outside chart from Dr. Amil Amen her rheumatologist dated 02/04/2015 reviewed. She reported to him that she is compliant on Ornecia and arava with good relief and joint pain. Her dyspnea is mild chronic and stable. His advice for her to continue the same. He suggested that I look into her chronic residual dyspnea at this visit   Last pulmonary imaging with CT scan of the chest December 2015:residual changes + along with emphysema; report reviewed. Image not visualized  Pulmonary function test 02/10/2015 today personally reviewed image: FEV1 prebronchodilator 1.7 L/64%, ratio 69.  FEV1 postbronchodilator is 1.9 L/72% and is a 12% positive response with broncho-dilator., FVC 2.4 L/72% and ratio of 79. Total lung capacity is 3.76 L/76%. DLCO is 18.5/80%. PFTs are consistent  with restriction and a normal DLCO but in the prebronchodilator she has mild obstruction   OV 08/11/2015  Chief Complaint  Patient presents with  . Follow-up    Pt states her breathing has improved since last OV. Pt states she has only been using the spiriva as needed. Pt c/o prod cough with clear mucus and DOE with heavy exertion. pt denies CP/tightness.     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 She continues to do well. She is now on leflunomide and Orencia for her rheumatoid arthritis. With this rheumatoid arthritis symptoms have improved. She is follows up with Dr. Amil Amen in his new rheumatology practice [facility]. She feels that her wheezing has significantly improved. Shortness of breath significantly improved. She is not taking Spiriva anymore. She reported some hoarseness with it. She wants to continue to watch herself without Spiriva. She is up-to-date with her flu shot.   OV 02/09/2016  Chief Complaint  Patient presents with  . Follow-up    Pt reports that her breathing is doing well. Pt c/o occasional dry cough that is worse when using a vape for recreational use. Pt also c/o occasional wheeze with hot, humid weather and exertion. Pt does use albuterol HFA and her symptoms improve. Pt denies SOB/CP/tightness.        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 Doing well but has started smoking again though is nicotine-free vape smoking now. She did not want spiriva due to dpi and throat irritation. But she is using albuterol atleast daily. She is having exertional wheeze but otherwise okayt. Maureen Chatters  continues with Dr Amil Amen. She tells me she never had shingles vaccine. Otherwise well. Working at [employer] . Does not want cxr 02/09/2016 for fu. Wants to defer till next visit   OV 12/08/2017  Chief Complaint  Patient presents with  . Follow-up    Last seen 02/09/16.  Pt states she has been doing well since last visit and denies any complaints.      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  She went through a separation and divorce and this time.  Smoking continues  to be in remission.  At last visit she was smoking but now is in remission.  She continues to work at the [employer].  For her rheumatoid arthritis she has been switched out of orcencia to [place] and this is working well for her.  She only has some mild pain.  In terms of her respiratory symptoms she hardly has any symptoms.  Definitely no cough.  She has mild dyspnea on exertion when working out in the yard but this is stable.  Review of the chart shows that her last echocardiogram was in 2015.  Her last chest x-ray or imaging was in 2016 and so was a pulmonary function test.  She wants to make sure her lungs are doing okay at this point.   OV 01/27/2018  Chief Complaint  Patient presents with  . Follow-up    Breathing is improved since last OV. Still has some coughing in the mornings. Denies chest tightness, wheezing.   Chalisa Kobler presents for follow-up after testing.  In the interim she continues to be stable.  She only has mild dyspnea on exertion when she does gardening.  For this she uses albuterol as needed she does not want to upscale on her inhalers.  Testing showed atrial septal aneurysm and coronary artery calcification.  Cardiology has reassured her on both accounts.  In terms of the lung function that seems to be  a decline in FEV1 compared to 2016 but very similar to 2015.  DLCO is stable all along.  She feels stable.  CT scan of the chest shows improved bronchiectasis compared to few years ago.  The only issue is that in the right upper lobe that seems to be a cavity with a potential aspergilloma although this is totally uncertain at this stage.  She does not have any hemoptysis or any symptoms.  She is immunocompromised on rheumatoid arthritis medication Orencia at this point.  Apparently the Morrie Sheldon was stopped.   Results for LAYANI, FORONDA (MRN [id number]) as of 01/27/2018 10:48  Ref. Range 04/02/2014 11:42 02/10/2015 13:49 01/27/2018 09:57  FEV1-Post Latest Units: L 1.68 1.89 1.63  FEV1-%Pred-Post Latest Units: % 64 73 64  FEV1-%Change-Post Latest Units: % _0 Results for KARRIGAN, MESSAMORE (MRN [id number]) as of 01/27/2018 10:48  Ref. Range 04/02/2014 11:42 02/10/2015 13:49 01/27/2018 09:57  DLCO unc Latest Units: ml/min/mmHg 13.15 18.50 18.02  DLCO unc % pred Latest Units: % 57 80 78    IMPRESSION: CT chest 1. Widespread areas of bronchiectasis and scarring, generally similar to the prior examination. Two of the thick-walled cavitary areas in the right upper lobe seen on the prior study have resolved, however, there is a new area of scarring in the right upper lobe near the apex which contains some internal soft tissue which is favored to represent some sloughed necrotic lung tissue, although an aspergilloma is not entirely excluded. Close attention on future follow-up imaging is recommended. 2. Possible honeycombing in the extreme lung bases which could indicate early interstitial lung disease. Future follow-up examination should be performed is high-resolution chest CTs to better evaluate these findings. 3. Cholelithiasis. 4. Aortic atherosclerosis, in addition to 2 vessel coronary artery disease. Please note that although the presence of coronary artery calcium documents the presence  of coronary artery disease, the severity of this disease and any potential stenosis cannot be assessed on this non-gated CT examination. Assessment for potential risk factor modification, dietary therapy or pharmacologic therapy may be warranted, if clinically indicated.  Aortic Atherosclerosis (ICD10-I70.0).   Electronically Signed   By: Vinnie Langton M.D.   On: 12/21/2017 13:00   ECHO 12/12/17 Study Conclusions  - Left ventricle: The cavity size was normal. Wall thickness was   normal. Systolic function was vigorous. The estimated ejection   fraction was in the range of 65% to 70%. Wall motion was normal;   there were no regional wall motion abnormalities. Doppler   parameters are consistent with abnormal left ventricular   relaxation (grade 1 diastolic dysfunction). - Atrial septum: There was an atrial septal aneurysm.  Impressions:  - Vigorous LV systolic function, mild diastolic dysfunction.  OV 08/11/2018  Subjective:  Patient ID: Jonne Ply, female , DOB: [birth year] , age 9 y.o. , MRN: [id number] , ADDRESS: [place] [id number]   08/11/2018 -   Chief Complaint  Patient presents with  . Follow-up    f/u bronchiectasis, PFT today, pt doing well , only cough in tha am with little mucus     HPI MALLEY HAUTER 59 y.o. -presents for follow-up of bronchiectasis suffered in 2015 following really severe pneumococcal pneumonia in the setting of immunosuppression due to rheumatoid arthritis.  This routine follow-up.  When I saw her in the spring 2019 we did a CT scan of the chest and there was suggestion of possible aspergillus cavity.  Did Aspergillus antibody and she was positive for IgG.  Therefore referred her to infectious diseases.  I reviewed the note.  Given overall stability expectant approach has been adopted.  At this point in time she feels stable using albuterol as needed.  Vaccine records indicate she could benefit from both  shingles vaccine and Prevnar.  She seems to be under the assumption that shingles vaccine is only after age 52.  I reviewed the literature and it is actually indicated for people over 50.  However she should only get the new Shingrix vaccine which is inactivated.  Prevnar is also indicated for her given her previous pneumococcal pneumonia and her immunosuppressed status.  Coronary artery calcification on CT scan: This was seen in April 2019.  Since then she did see cardiology.  They are aware of this and the place her on follow-up.  She does not have any chest pain currently.    OV 09/13/2019  Subjective:  Patient ID: Jonne Ply, female , DOB: 03/21/1961 , age 70 y.o. , MRN: [id number] , ADDRESS: [place] [id number]   09/13/2019 -   Chief Complaint  Patient presents with  . Follow-up    Pt states she has been spitting up bloody mucus every morning and states her throat is also raw feeling. Pt was diagnosed with covid 11/19 and did okay with that other than losing her sense of taste and smell.   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 Gerri Spore  HPI Jonne Ply 59 y.o. -returns for follow-up.  Last seen in December 2019.  After that in the summer 2020 CT scan of the chest showed progression.  I referred her to Dr. Scharlene Gloss who placed her on voriconazole.  She is currently finished her treatment with voriconazole but she tells me for the last 6 months she continues to have early morning mild hemoptysis versus pink sputum.  She says  that despite the voriconazole this persist.  In early November 2020/mid November 2020 she picked up COVID-19 while singing in a church along with her sister who also had COVID-19.  One of the  person died in that cluster.  That happens to be my patient.  She tells me since the COVID-19 for which she did not get hospitalized she has had slight increase in cough and slight increase in hemoptysis and ongoing fatigue.  For her rheumatoid arthritis she is only on prednisone she is unable to take any immunomodulators because of her pulmonary issues.  The disease has gotten progressive with the onset of rheumatoid nodules in her bilateral elbows.  She has gained weight in the pandemic because of being sedentary.   Review of the chart indicates last echo was in 2019.  She has a murmur.  Her last CT scan of the chest was in August 2020.   She wants to know if it is okay to go back on immunosuppressive's.- d/w Dr Amil Amen -  DMARD prob ok but not biologics   OV 10/11/2019  Subjective:  Patient ID: Jonne Ply, female , DOB: [birth year] , age 22 y.o. , MRN: [id number] , ADDRESS: [place] [address]   10/11/2019 -   Chief Complaint  Patient presents with  . 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 Gerri Spore  #small hiatl hernia  #coronary artery calcification and baseline heart murmur - dr Angelena Form   HPI MATA ROWEN 59 y.o. -presents to discuss the test results in the setting of above medical issues.  She again tells me that she started having hemoptysis 6 months ago or so.  She was then given voriconazole.  She says with that the pink slight hemoptysis did not resolve.  Then she finished an adequate course of voriconazole but the light streaky pink hemoptysis is continued.  Then several days ago she had like one dark clot.  She is now given that sputum for  AFB cultures are pending smear is negative.  Otherwise new problem is she is constipated for 3 days.  She is also taking her prednisone at 5 mg/day because it is causing increased weight gain.  Her arthralgia is worse.  She plans to see rheumatology in a week or 2.  New issue is constipation for the last 3 days.  However in review of the chart she has had constipation in the past but she is expressing it to me for the first time.  She also wants treatment for this.  In terms of CT scan of the chest: Radiologist calling for presence of UIP/ILD with progression.  In addition she has a fibrocavitary fibronodular changes with aspergilloma.  She also has coronary artery calcification  Echocardiogram shows left ventricular hypertrophy with normal ejection fraction but there is a description of left to right interatrial shunt.  I think this is a new finding.  IMPRESSION Jan 2021 1. Spectrum of findings at the lung bases worrisome for progressive fibrotic interstitial lung disease with mild-to-moderate honeycombing, clearly progressive compared to 2015 and 2019 chest CT studies, with more mild progression since 05/02/2019 chest CT. Findings are consistent with UIP per consensus guidelines: Diagnosis of Idiopathic Pulmonary Fibrosis: An Official  ATS/ERS/JRS/ALAT Clinical Practice Guideline. [place], Iss 5, [phone], May 07 2017. 2. Extensive chronic fibrocavitary and fibronodular changes in the right greater than left lung apices with evidence of mycetomas in the large irregular thick walled cavitary region in the apical right lung, which has mildly increased in size. 3. One vessel coronary atherosclerosis. 4. Stable mild right paratracheal lymphadenopathy, most compatible with benign reactive etiology. 5. Small hiatal hernia. 6. Cholelithiasis.  Aortic Atherosclerosis (ICD10-I70.0) and Emphysema (ICD10-J43.9).   Electronically Signed   By: Ilona Sorrel M.D.   On:  09/26/2019 16:43  ECHO Jan 2021  -- LVH ef 65% ROS - per HPI   OV 10/29/2019  Subjective:  Patient ID: Jonne Ply, female , DOB: 1961-04-15 , age 46 y.o. , MRN: [id number] , ADDRESS: [place] [id number]   10/29/2019 -   Chief Complaint  Patient presents with  . Follow-up    Pt states she has been doing okay since last visit. States she is still occ coughing up blood. Pt states that she is sore/achey in the mornings prior to getting up and will take aleve to help with that.   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 Linus Salmons  #ILD/UIP  - progressive UIP pattern - on HRCt April 2019 -> 2021 Jan  -PFT testing incongruent  #small hiatl hernia  #coronary artery calcification and baseline heart murmur - dr Angelena Form  #RA  - Dr Amil Amen HPI Jonne Ply 59 y.o. -returns for follow-up.  Last seen in early in February 2021.  Several concerns at that time.  In the interim I presented her case at the multidisciplinary case conference.  The determination was that she had early ILD changes even in April 2019 and is now progressed to a definite UIP pattern.  This is a new finding for [place].  Concern of the conference is that she has rheumatoid arthritis ILD/UIP pattern.  And this is progressive.  There for this visit is to discuss specifically the UIP issues but also round of the other issues.  Again with this diagnosis.  Also discussed the potential for participation in a research registry.  She is interested.  We also discussed antifibrotic therapy nintedanib.  Discussed contraindications which she does not  seem to have.  She does not have colitis.  Infectious constipation.  She does not have any bleeding diathesis.  Not on blood thinners.  She is on voriconazole and Plaquenil and now with nintedanib she will require intensive liver function test monitoring.  She is open for this.  She understands this drug is to help prevent fibrosis getting worse but will not manage her symptoms.  In terms of her ongoing hemoptysis with fibrocavitary bronchiectasis and aspergilloma: She revisited with Dr. Gerri Spore.  I reviewed the note.  Patient has been restarted on voriconazole 3 days ago on October 26, 2019.  After this the hemoptysis might be slightly better.  Multidisciplinary case conference consideration is for bronchoscopy with lavage but again this has been disrupted because of the COVID-19 pandemic.  She thinks hemoptysis is slightly better but is still present.   In terms of rheumatoid arthritis: She  visited with Dr. Amil Amen.  I reviewed the note.  She has been asked to continue with the prednisone 5 mg/day.  Plaquenil was been added because of her pain.  She is extremely upset about her rheumatoid arthritis nodules in the bilateral elbow region.  Also with ongoing pain.  In terms of coronary artery calcification based on heart murmur: I referred her to Dr. Angelena Form.  Was rereferral.  I do not see an appointment made.        OV 01/01/2020  Subjective:  Patient ID: Jonne Ply, female , DOB: August 24, 1961 , age 32 y.o. , MRN: [id number] , ADDRESS: [place] [id number] Follow-up   01/01/2020 -   Chief Complaint  Patient presents with  . Follow-up    Pt states she has been doing okay since last visit. Pt states she has been having some trouble with the Esbriet medication.      HPI JAELIANA LOCOCO 59 y.o. -returns for follow-up of the above issues.  The main focus of this visit is to monitor her for pirfenidone tolerance.  She started the pirfenidone approximately December 17, 2019.  She says she has done well with 1 tablet 3 times daily.  She is adequately spacing and taking it with food.  However the following week when she went up to 2 tablets 3 times daily she started having significant amount of nausea and vomiting.  Therefore she stopped.  No abdominal pain or diarrhea.  She is intentionally lost some weight.  She thinks the nausea and vomiting was related to taking the second shot of the Covid vaccine.  Nevertheless she is apprehensive about going up on the pirfenidone.  Currently tolerating it well in her second or third week of 1 tablet 3 times daily.  She wants further advice.  She supposed to have pulmonary function testing today.  Her last liver function testing was December 10, 2019 and her AST was high normal.  In terms of hemoptysis: Mild trace hemoptysis continues to be present  In terms of her aspergilloma: She is continues to be on voriconazole  In terms of rheumatoid arthritis she is on Plaquenil and prednisone 5 mg/day.  She visit with Dr. Amil Amen December 27, 2019 notes reviewed  In terms of a cardiac murmur: She says she saw Dr. Angelena Form nurse practitioner but I cannot review this note I do not know why.   OV 02/14/2020  Subjective:  Patient ID: Jonne Ply, female , DOB: May 06, 1961 , age 38 y.o. , MRN: [id number] , ADDRESS: [place] [id number]   02/14/2020 -   Chief Complaint  Patient presents with  . Follow-up    Pt states she has been doing okay since last visit. 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 Linus Salmons  #ILD/UIP  - progressive UIP pattern - on HRCt April 2019 -> 2021 Jan  - started esbriet approx 12/10/19 -stopped May 2021 due to GI side effects  -  Awaiting nintedanib and June 2021.  #small hiatl hernia  #coronary artery calcification and baseline heart murmur - dr Angelena Form  #RA  - Dr Amil Amen   - plaquenil   - prednismoe 5 mg  -Severe pain  -Unable to take immunosuppressive drugs because of respiratory infection.   HPI FRANCENA ZENDER 59 y.o. -returns for follow-up. This visit was supposed to be to evaluate how she is doing off the pirfenidone having started nintedanib. Pirfenidone she had significant GI intolerance. She is doing better from a GI tolerance standpoint without the pirfenidone but she is having no luck getting nintedanib approved. A pharmacy is working on this. She continues to have intermittent hemoptysis. She has bronchiectasis from her 71 pneumonia. That is persistently infected with aspergilloma. On top of this she is also got pulmonary fibrosis. This is preventing him from getting immunosuppressive's. As a result she is only on Plaquenil and low-dose prednisone. Currently she is dealing with severe pain along with a daily intermittent hemoptysis. She is barely able to sit or stand. She not able to likes her wrist completely. She has new onset rheumatoid nodule in her feet now. The rheumatoid nodules in her elbows are already present. She has difficulty putting her clothes on. At this point in time she is completely unemployed. Her mortgage, her food her clothes and her car loan is being paid for by family. Given her health insurance is being paid for by family. She is not on Medicaid. She applied for Social Security disability but got denied. She is upset and she is in tears. Her FEV1 is less than 70%. Lung function testing serial as documented below. The DLCO today is  inaccurate.    OV 05/15/2020   Subjective:  Patient ID: Jonne Ply, female , DOB: [birth year], age 9 y.o. years. , MRN: [id number],  ADDRESS: [place] [address] PCP  Eulas Post, MD Providers : Treatment Team:  Attending Provider: Brand Males, MD   #bronchiectasis with complication by fibrocavitary lung disease and persistent infection with aspergilloma  #hemoptysis  - suffered in 2015 following severe pneumococcal pneumonia in the setting of immunosuppression from rheumatoid arthritis  - Course complicated by aspergilloma in the right upper lobe in the setting of pulmonary cavity-diagnosis made by positive IgG  - sp bronch 11/27/19  BAL RUL - 94% polus, - culture negative  = normal ENT edam 11/20/19 Dr Lucia Gaskins   -followed by Dr. Linus Salmons  #ILD/UIP  - progressive UIP pattern - on HRCt April 2019 -> 2021 Jan  - started esbriet approx 12/10/19 -stopped May 2021 due to GI side effects  -Awaiting nintedanib and June 2021.  #small hiatl hernia  #coronary artery calcification and baseline heart murmur - dr Angelena Form  #RA  - Dr Amil Amen   - plaquenil   - prednismoe 5 mg  -Severe pain  -Unable to take immunosuppressive drugs because of respiratory infection.   Chief Complaint  Patient presents with  . Follow-up    pt is spitting up blood       HPI ANTONIETA SLAVEN 59 y.o. -returns for follow-up of the above issues.  She is doing stable.  She is exercising 4 miles on exercise bike but her finger deformities are so bad she is not able to grip things and she has significant amount of pain.  She continues to have daily hemoptysis associated with fatigue.  The hemoptysis is mild.  It is stable.  She is worried about this.  She  is now on nintedanib for the last 6 weeks.  She has occasional vomiting once or twice a month with gagging of food but no diarrhea or other side effects.  She is a requirement of lab work.  She has had a Covid vaccine but is in  requirement of a booster.  She has not had a flu shot.  She is awaiting Social Security disability document.   After she left I was able to call interventional radiology IR clini [place] Dr Catha Brow.  He reviewed the scans and visualized it himself.  He feels patient has bilateral cavity with predominant cavity on the right side.  He thinks patient's hemoptysis is coming from the right side.  He thinks given the patient's complexity patient might benefit from IR guided embolization on the right side.  However he said that patient has to be seen in the clinic first.  Therefore we will make a referral.  SYMPTOM SCALE - ILD 02/14/2020  05/15/2020 144#  O2 use ra   Shortness of Breath 0 -> 5 scale with 5 being worst (score 6 If unable to do)   At rest 3   Simple tasks - showers, clothes change, eating, shaving 3   Household (dishes, doing bed, laundry) 3   Shopping 3   Walking level at own pace 3   Walking up Stairs 4   Total (30-36) Dyspnea Score 19   How bad is your cough? 3   How bad is your fatigue 4   How bad is nausea 4   How bad is vomiting?  2   How bad is diarrhea? 2   How bad is anxiety? 4   How bad is depression 4    Simple office walk 185 feet x  3 laps goal with forehead probe 05/15/2020   O2 used ra  Number laps completed 3  Comments about pace fast  Resting Pulse Ox/HR 100% and 77/min  Final Pulse Ox/HR 99% and 111/min  Desaturated </= 88% no  Desaturated <= 3% points n  Got Tachycardic >/= 90/min yes  Symptoms at end of test none  Miscellaneous comments X - does 4 miles on exer bike dailty      PFT Results Latest Ref Rng & Units 02/13/2020 08/11/2018 01/27/2018 02/10/2015 04/02/2014  FVC-Pre L 2.20 2.26 2.22 2.42 1.85  FVC-Predicted Pre % 68 69 68 73 55  FVC-Post L - - 2.16 2.40 1.82  FVC-Predicted Post % - - 66 72 54  Pre FEV1/FVC % % 78 71 71 69 87  Post FEV1/FCV % % - - 75 79 92  FEV1-Pre L 1.72 1.60 1.58 1.68 1.62  FEV1-Predicted Pre % 69 63 62 64 61    FEV1-Post L - - 1.63 1.89 1.68  DLCO uncorrected ml/min/mmHg 16.24 15.53 18.02 18.50 13.15  DLCO UNC% % 83 67 78 80 57  DLCO corrected ml/min/mmHg 29.62 - - - -  DLCO COR %Predicted % 151 - - - -  DLVA Predicted % 213 101 120 107 101  TLC L - - - 3.76 3.52  TLC % Predicted % - - - 76 71  RV % Predicted % - - - 68 102   ROS - per HPI     has a past medical history of Abnormal Pap smear (09/2006), CHF (congestive heart failure) ([facility]), Foot fracture, left (10/16), Pneumonia, and RA (rheumatoid arthritis) ([place]) (07/2009).   reports that she quit smoking about 6 years ago. Her smoking use included  cigarettes and e-cigarettes. She started smoking about 36 years ago. She has a 30.00 pack-year smoking history. She has never used smokeless tobacco.  Past Surgical History:  Procedure Laterality Date  . BRONCHIAL WASHINGS  11/27/2019   Procedure: BRONCHIAL WASHINGS;  Surgeon: Brand Males, MD;  Location: [facility];  Service: Cardiopulmonary;;  . CERVICAL BIOPSY  W/ LOOP ELECTRODE EXCISION  2008   CIN 3  . CERVICAL CONIZATION W/BX N/A 12/15/2015   Procedure: CONIZATION CERVIX WITH BIOPSY ;  Surgeon: Megan Salon, MD;  Location: [facility];  Service: Gynecology;  Laterality: N/A;  POSSIBLE COLD KNIFE CONE.  Please have the microscope in the room  . COLONOSCOPY    . LEEP N/A 12/15/2015   Procedure: LOOP ELECTROSURGICAL EXCISION PROCEDURE (LEEP) with colpo;  Surgeon: Megan Salon, MD;  Location: [facility];  Service: Gynecology;  Laterality: N/A;  . PILONIDAL CYST EXCISION  1999   I&D  . VIDEO BRONCHOSCOPY N/A 11/27/2019   Procedure: VIDEO BRONCHOSCOPY WITHOUT FLUORO;  Surgeon: Brand Males, MD;  Location: [facility];  Service: Cardiopulmonary;  Laterality: N/A;  . WISDOM TOOTH EXTRACTION      Allergies  Allergen Reactions  . Bee Venom Anaphylaxis  . Codeine Other (See Comments)    "seeing spots"  . Pirfenidone Other (See Comments)    GI side effects  . Penicillins Rash    Has  patient had a PCN reaction causing immediate rash, facial/tongue/throat swelling, SOB or lightheadedness with hypotension: No Has patient had a PCN reaction causing severe rash involving mucus membranes or skin necrosis: No Has patient had a PCN reaction that required hospitalization No Has patient had a PCN reaction occurring within the last 10 years: Yes If all of the above answers are "NO", then may proceed with Cephalosporin use.     Immunization History  Administered Date(s) Administered  . Influenza Inj Mdck Quad Pf 05/25/2019  . Influenza,inj,Quad PF,6+ Mos 06/25/2015, 05/31/2017, 05/29/2018  . Influenza-Unspecified 06/06/2014  . PFIZER SARS-COV-2 Vaccination 11/29/2019, 12/22/2019  . Pneumococcal Conjugate-13 08/11/2018  . Pneumococcal Polysaccharide-23 02/18/2014  . Tdap 09/01/2016    Family History  Problem Relation Age of Onset  . Diabetes Mother   . Rheum arthritis Mother   . Diabetes Father   . COPD Father   . Bronchitis Father   . Cancer Brother        Lung  . Breast cancer Maternal Aunt 68  . CAD Neg Hx      Current Outpatient Medications:  .  albuterol (VENTOLIN HFA) 108 (90 Base) MCG/ACT inhaler, INHALE 2 PUFFS INTO THE LUNGS EVERY 6 HOURS AS NEEDED FOR WHEEZING OR SHORTNESS OF BREATH (Patient taking differently: Inhale 2 puffs into the lungs every 6 (six) hours as needed for wheezing or shortness of breath. ), Disp: 8.5 g, Rfl: 3 .  benzonatate (TESSALON) 200 MG capsule, TAKE 1 CAPSULE(200 MG) BY MOUTH THREE TIMES DAILY AS NEEDED FOR COUGH, Disp: 90 capsule, Rfl: 3 .  EPINEPHrine (EPIPEN 2-PAK) 0.3 mg/0.3 mL IJ SOAJ injection, Inject 0.3 mg into the muscle as needed for anaphylaxis. , Disp: , Rfl:  .  escitalopram (LEXAPRO) 10 MG tablet, TAKE 1 TABLET BY MOUTH EVERY DAY, Disp: 90 tablet, Rfl: 3 .  HYDROcodone-homatropine (HYCODAN) 5-1.5 MG/5ML syrup, Take 5 mLs by mouth every 6 (six) hours as needed for cough., Disp: 240 mL, Rfl: 0 .  hydroxychloroquine  (PLAQUENIL) 200 MG tablet, Take 200 mg by mouth 2 (two) times daily., Disp: , Rfl:  .  Nintedanib (OFEV) 150 MG CAPS, Take 1 capsule (150 mg total) by mouth 2 (two) times daily., Disp: 180 capsule, Rfl: 1 .  ondansetron (ZOFRAN) 4 MG tablet, Take 1 tablet (4 mg total) by mouth 3 (three) times daily as needed for nausea., Disp: 90 tablet, Rfl: 3 .  predniSONE (DELTASONE) 5 MG tablet, Take 2.5 mg by mouth daily with breakfast. , Disp: , Rfl:  .  voriconazole (VFEND) 200 MG tablet, TAKE 1 TABLET(200 MG) BY MOUTH TWICE DAILY, Disp: 60 tablet, Rfl: 2      Objective:   Vitals:   05/15/20 1040  BP: 116/72  Pulse: 76  Temp: 97.7 F (36.5 C)  TempSrc: Oral  SpO2: 96%  Weight: 144 lb (65.3 kg)  Height: _0  (1.6 m)    Estimated body mass index is 25.51 kg/m as calculated from the following:   Height as of this encounter: _1  (1.6 m).   Weight as of this encounter: 144 lb (65.3 kg).  _2 @  [employer]   05/15/20 1040  Weight: 144 lb (65.3 kg)     Physical Exam  General Appearance:    Alert, cooperative, no distress, appears stated age - yes , Deconditioned looking - no , OBESE  - no, Sitting on Wheelchair -  no  Head:    Normocephalic, without obvious abnormality, atraumatic  Eyes:    PERRL, conjunctiva/corneas clear,  Ears:    Normal TM's and external ear canals, both ears  Nose:   Nares normal, septum midline, mucosa normal, no drainage    or sinus tenderness. OXYGEN ON  - no . Patient is @ ra   Throat:   Lips, mucosa, and tongue normal; teeth and gums normal. Cyanosis on lips - no  Neck:   Supple, symmetrical, trachea midline, no adenopathy;    thyroid:  no enlargement/tenderness/nodules; no carotid   bruit or JVD  Back:     Symmetric, no curvature, ROM normal, no CVA tenderness  Lungs:     Distress - no , Wheeze no, Barrell Chest - no, Purse lip breathing - no, Crackles - yes at base and scattered   Chest Wall:    No tenderness or deformity.    Heart:     Regular rate and rhythm, S1 and S2 normal, no rub   or gallop, Murmur - yes  Breast Exam:    NOT DONE  Abdomen:     Soft, non-tender, bowel sounds active all four quadrants,    no masses, no organomegaly. Visceral obesity - no  Genitalia:   NOT DONE  Rectal:   NOT DONE  Extremities:   Extremities - normal, Has Cane - yes, Clubbing - no, Edema - no  Pulses:   2+ and symmetric all extremities  Skin:   Stigmata of Connective Tissue Disease - severe RA deformity of hands and fingers and Nodule R elblos and left foot  Lymph nodes:   Cervical, supraclavicular, and axillary nodes normal  Psychiatric:  Neurologic:   Pleasant - yes, Anxious - no, Flat affect - no  CAm-ICU - neg, Alert and Oriented x 3 - yes, Moves all 4s - yes, Speech - normal, Cognition - intact           Assessment:       ICD-10-CM   1. Hemoptysis  R04.2   2. Interstitial lung disease due to connective tissue disease ([facility])  J84.89    M35.9   3. Encounter for therapeutic drug monitoring  Z51.81  4. Pulmonary aspergilloma ([place])  B44.9   5. Rheumatoid arthritis involving multiple joints (H[facility])  M06.9   6. Immunosuppressed status ([place])  D84.9   7. Vaccine counseling  Z71.89        Plan:     Patient Instructions  Hemoptysis  - mild and ongoing and due to below lung issues despite  voricanazole restart on 10/26/2019 - completed bronch 11/27/19 - no new culture results  Plan  - no specific intervention but will check with interventional radiology if you are a candidate for clot treatment  - any massive or major coughing up blood - go to ER   Pulmonary aspergilloma (HCC) Bronchiectasis with LRTI Iinfection of aspergilloma  -noted back on voricanazole  Plan  - per Dr Linus Salmons - continue voricanazole   Interstitial lung disease due to connective tissue disease ([place]) - UIP pattern, progresive Encounter for therapeutic monitoring  -off esbriet due to side effects -6-minute walk test summer 2021 without  desaturation -On nintedanib since late July 2021 -Tolerating nintedanib okay except for some mild occasional vomiting  Plan -list esbriet as allergy -Continue nintedanib 150 mg twice daily -Check CBC, chemistry and liver function test as part of therapeutic monitoring -today 2020-05-15   Rheumatoid arthritis involving multiple joints ([facility])  -This appears active given the onset of pulmonary nodules and new elbows and also interstitial lung disease in the lung and active joint pain  - Plan -plaquenil oer Dr Amil Amen -Keep your prednisone at 5 mg/day -Rest of treatment per Dr. Amil Amen  Financial Difficulties Disabiltuy eval  -Noted that you are waiting judgments from Social Security about your disability application  Plan  - apply to medicaid as well  Vaccine counseling in a immunosuppressed status patient  -Because you're immunosuppressed re you commend third shot booster for COVID-19 vaccine -Recommend flu shot 05/15/2020   Follow-up -Repeat liver function test in 4 weeks and we can call you with the result -Return in 12 weeks to see Dr. Chase Caller on a 30-minute slot face-to-face visit  Addendum: We will refer to IR clinic   SIGNATURE    Dr. Brand Males, M.D., F.C.C.P,  Pulmonary and Critical Care Medicine Staff Physician, [place] Director - Interstitial Lung Disease  Program  P[facility] [place] [place], [place], [address]  Pager: [phone], If no answer or between  15:00h - 7:00h: call [phone] Telephone: [phone]  11:12 AM 05/15/2020

## 2020-05-16 ENCOUNTER — Other Ambulatory Visit: Payer: Self-pay | Admitting: Internal Medicine

## 2020-05-16 DIAGNOSIS — Z23 Encounter for immunization: Secondary | ICD-10-CM | POA: Diagnosis not present

## 2020-05-16 DIAGNOSIS — R042 Hemoptysis: Secondary | ICD-10-CM

## 2020-05-16 NOTE — Addendum Note (Signed)
Addended by: Luna Kitchens D on: 05/16/2020 08:38 AM   Modules accepted: Orders

## 2020-05-16 NOTE — Progress Notes (Signed)
Cbc and lft normal. Alk phos high but been high in past. Monitor for that  Lab             05/15/20                      1128         AST          15           ALT          15           ALKPHOS      188*         BILITOT      0.3          PROT         7.9          ALBUMIN      4.0          Lab             05/15/20                      1128         HGB          12.0         HCT          36.7         WBC          11.6*        PLT          357.0          These normal results will NOT be called in

## 2020-05-21 ENCOUNTER — Other Ambulatory Visit: Payer: Self-pay

## 2020-05-21 ENCOUNTER — Ambulatory Visit
Admission: RE | Admit: 2020-05-21 | Discharge: 2020-05-21 | Disposition: A | Discharge status: Home / Self Care | Payer: 59 | Source: Ambulatory Visit | Attending: Internal Medicine | Admitting: Internal Medicine

## 2020-05-21 DIAGNOSIS — R042 Hemoptysis: Secondary | ICD-10-CM

## 2020-05-22 NOTE — Telephone Encounter (Signed)
Kimberly Shelton, please advise. Thanks.

## 2020-05-27 ENCOUNTER — Other Ambulatory Visit: Payer: Self-pay

## 2020-05-27 ENCOUNTER — Encounter: Payer: Self-pay | Admitting: *Deleted

## 2020-05-27 ENCOUNTER — Other Ambulatory Visit: Payer: Self-pay | Admitting: Interventional Radiology

## 2020-05-27 ENCOUNTER — Ambulatory Visit
Admission: RE | Admit: 2020-05-27 | Discharge: 2020-05-27 | Disposition: A | Discharge status: Home / Self Care | Payer: 59 | Source: Ambulatory Visit | Attending: Internal Medicine | Admitting: Internal Medicine

## 2020-05-27 ENCOUNTER — Other Ambulatory Visit: Payer: Self-pay | Admitting: *Deleted

## 2020-05-27 DIAGNOSIS — R042 Hemoptysis: Secondary | ICD-10-CM

## 2020-05-27 HISTORY — PX: IR RADIOLOGIST EVAL & MGMT: IMG5224

## 2020-05-27 NOTE — Consult Note (Signed)
Chief Complaint: Patient was consulted remotely today (TeleHealth) for hemoptysis at the request of Ramaswamy,Murali.    Referring Physician(s): Ramaswamy,Murali  History of Present Illness: Kimberly Shelton is a 59 y.o. female with a history of chronic rheumatoid arthritis on immune modulators as well as a history of smoking.  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  She reports that for approximately the past 6 months she has been having daily hemoptysis.  She states that her hemoptysis only occurs in the morning when she first wakes up and she coughs up mucus which may be pink in color, or at times streaked with darker red blood.  She notes that the coughing fades and she has no further episodes of hemoptysis for the rest of the day.  The hemoptysis has been fairly consistent.  She denies any episodes of coughing up frank blood rather than just blood tinged mucus.  She is on voriconazole, Plaquenil and nintedanib.  Otherwise, she has in her chronic state of health and denies any new or acute problems.  Specifically, she denies chest pain, fever, chills or recent worsening of hemoptysis.  Past Medical History:  Diagnosis Date   Abnormal Pap smear 09/2006   HGSIL CIN 2/VAIN/ CIN 3/VAIN-3 CIS   CHF (congestive heart failure) (HCC)    Foot fracture, left 10/16   hair-line fracture   Pneumonia    was hospitalized   RA (rheumatoid arthritis) (HCC) 07/2009    Past Surgical History:  Procedure Laterality Date   BRONCHIAL WASHINGS  11/27/2019   Procedure: BRONCHIAL WASHINGS;  Surgeon: Kalman Shan, MD;  Location: [facility];  Service: Cardiopulmonary;;   CERVICAL BIOPSY  W/ LOOP ELECTRODE EXCISION  2008   CIN 3   CERVICAL CONIZATION W/BX N/A 12/15/2015   Procedure: CONIZATION CERVIX WITH BIOPSY ;  Surgeon: Jerene Bears, MD;  Location: [facility];  Service: Gynecology;  Laterality: N/A;  POSSIBLE COLD KNIFE CONE.  Please have the microscope in the room   COLONOSCOPY     LEEP N/A 12/15/2015   Procedure: LOOP ELECTROSURGICAL EXCISION PROCEDURE (LEEP) with colpo;  Surgeon: Jerene Bears, MD;  Location: [facility];  Service: Gynecology;  Laterality: N/A;   PILONIDAL CYST EXCISION  1999   I&D   VIDEO BRONCHOSCOPY N/A 11/27/2019   Procedure: VIDEO BRONCHOSCOPY WITHOUT FLUORO;  Surgeon: Kalman Shan, MD;  Location: [facility];  Service: Cardiopulmonary;  Laterality: N/A;   WISDOM TOOTH EXTRACTION      Allergies: Bee venom, Codeine, Pirfenidone, and Penicillins  Medications: Prior to Admission medications   Medication Sig Start Date End Date Taking? Authorizing Provider  albuterol (VENTOLIN HFA) 108 (90 Base) MCG/ACT inhaler INHALE 2 PUFFS INTO THE LUNGS EVERY 6 HOURS AS NEEDED FOR WHEEZING OR SHORTNESS OF BREATH Patient taking differently: Inhale 2 puffs into the lungs every 6 (six) hours as needed for wheezing or shortness of breath.  11/07/19   Kalman Shan, MD  benzonatate (TESSALON) 200 MG capsule TAKE 1 CAPSULE(200 MG) BY MOUTH THREE TIMES DAILY AS NEEDED FOR COUGH 12/10/19   Glenford Bayley, NP  EPINEPHrine (EPIPEN 2-PAK) 0.3 mg/0.3 mL IJ SOAJ injection Inject 0.3 mg into the muscle as needed for anaphylaxis.     [provider]  escitalopram (LEXAPRO) 10 MG tablet TAKE 1 TABLET BY MOUTH EVERY DAY 05/13/20   Jerene Bears, MD  HYDROcodone-homatropine [facility]) 5-1.5 MG/5ML  syrup Take 5 mLs by mouth every 6 (six) hours as needed for cough. 04/09/20   Glenford Bayley, NP  hydroxychloroquine (PLAQUENIL) 200 MG tablet Take 200 mg by mouth 2 (two) times daily. 10/17/19   [provider]  Nintedanib (OFEV) 150 MG CAPS Take 1 capsule (150 mg total) by mouth 2 (two) times daily. 02/05/20   Kalman Shan, MD  ondansetron (ZOFRAN) 4 MG tablet Take 1 tablet (4 mg total) by mouth 3  (three) times daily as needed for nausea. 01/10/20   Kalman Shan, MD  predniSONE (DELTASONE) 5 MG tablet Take 2.5 mg by mouth daily with breakfast.  07/10/19   [provider]  voriconazole (VFEND) 200 MG tablet TAKE 1 TABLET(200 MG) BY MOUTH TWICE DAILY 01/28/20   Comer, Belia Heman, MD     Family History  Problem Relation Age of Onset   Diabetes Mother    Rheum arthritis Mother    Diabetes Father    COPD Father    Bronchitis Father    Cancer Brother        Lung   Breast cancer Maternal Aunt 65   CAD Neg Hx     Social History   Socioeconomic History   Marital status: Divorced    Spouse name: Not on file   Number of children: 1   Years of education: Not on file   Highest education level: Not on file  Occupational History   Occupation: Systems developer II  [employer]    Employer: [employer]  Tobacco Use   Smoking status: Former Smoker    Packs/day: 1.00    Years: 30.00    Pack years: 30.00    Types: Cigarettes, E-cigarettes    Start date: 1985    Quit date: 02/16/2014    Years since quitting: 6.2   Smokeless tobacco: Never Used  Vaping Use   Vaping Use: Every day  Substance and Sexual Activity   Alcohol use: No    Alcohol/week: 0.0 standard drinks   Drug use: No   Sexual activity: Not Currently    Partners: Male    Birth control/protection: Post-menopausal  Other Topics Concern   Not on file  Social History Narrative   Not on file   Social Determinants of Health   Financial Resource Strain:    Difficulty of Paying Living Expenses: Not on file  Food Insecurity:    Worried About Programme researcher, broadcasting/film/video in the Last Year: Not on file   [employer] of Food in the Last Year: Not on file  Transportation Needs:    Lack of Transportation (Medical): Not on file   Lack of Transportation (Non-Medical): Not on file  Physical Activity:    Days of Exercise per Week: Not on file   Minutes of Exercise per Session: Not on file  Stress:    Feeling of  Stress : Not on file  Social Connections:    Frequency of Communication with Friends and Family: Not on file   Frequency of Social Gatherings with Friends and Family: Not on file   Attends Religious Services: Not on file   Active Member of Clubs or Organizations: Not on file   Attends Banker Meetings: Not on file   Marital Status: Not on file    Review of Systems  Review of Systems: A 12 point ROS discussed and pertinent positives are indicated in the HPI above.  All other systems are negative.  Physical Exam No direct physical exam was performed (  except for noted visual exam findings with Video Visits).   Vital Signs: LMP 09/06/2004   Imaging: No results found.  Labs:  CBC: Recent Labs    08/13/19 1009 11/12/19 1044 12/10/19 0941 05/15/20 1128  WBC 11.4* 8.5 9.2 11.6*  HGB 11.2* 10.8* 11.4* 12.0  HCT 34.4* 33.8* 35.3 36.7  PLT 371 387 398 357.0    COAGS: No results for input(s): INR, APTT in the last 8760 hours.  BMP: Recent Labs    10/24/19 1422 11/12/19 1044 12/10/19 0941  NA 140 142 138  K 5.1 4.6 4.6  CL 101 105 102  CO2 30 29 29   GLUCOSE 109* 77 100*  BUN 14 10 10   CALCIUM 9.4 8.9 9.1  CREATININE 0.85 0.66 0.67  GFRNONAA 75  --  96  GFRAA 87  --  111    LIVER FUNCTION TESTS: Recent Labs    01/01/20 1140 01/21/20 1148 02/28/20 1130 05/15/20 1128  BILITOT 0.3 0.2 0.2 0.3  AST 17 15 32 15  ALT 11 10 24 15   ALKPHOS 218*  --   --  188*  PROT 7.7 6.4 7.2 7.9  ALBUMIN 3.8  --   --  4.0    TUMOR MARKERS: No results for input(s): AFPTM, CEA, CA199, CHROMGRNA in the last 8760 hours.  Assessment and Plan:  Very pleasant 59 year old female with chronic aspergillosis infection of the bilateral upper lobes.  She has some mild daily hemoptysis.  Mild hemoptysis is defined by less than 50 mL blood over a 24-hour period.  She describes daily blood-streaked or pink mucus but no frank hemoptysis.  We discussed the natural course  of chronic infection and how it can result in hypervascularity, tissue friability/fragility and progressive hemoptysis over time.  I also described to her the procedure of Bronchial artery angiography and embolization.  I described the 1 severe but rare complication of failure to recognize and protect a recurrent artery of [place]kiewicz.  Embolization of this artery can lead to spinal ischemia and paralysis.  I also described to her that bronchial artery embolization is typically reserved for recurrent or frequent moderate (50-200 mL / 24-hour), severe (>200 mL  /24 hr) and massive (> 600 mL / 24-hour) hemoptysis.  Fortunately, her hemoptysis is quite mild in comparison and while certainly a bother to her every morning, perhaps not yet significant enough to warrant embolization.  What I proposed was to begin with a CT arteriogram of the chest to evaluate her bronchial artery anatomy as well as get a better look at the parenchymal changes in her lungs compared to her prior CT scan from January 2021.  This will serve as the baseline examination.  Once the CT scan is done, I will have a follow-up appointment with her to discuss the findings.  I believe we will then proceed conservatively and be willing to proceed with bronchial artery angiography and embolization if her hemoptysis progresses over time.  1.)  CTA chest to evaluate bronchial artery anatomy in the setting of chronic hemoptysis to be followed by a follow-up teleconference.  Please try to arrange for a WebEx so that I can review the imaging in real-time with Mrs. Niazi.    Thank you for this interesting consult.  I greatly enjoyed meeting AJAI MELLOTT and look forward to participating in their care.  A copy of this report was sent to the requesting provider on this date.  Electronically Signed: Malachy Moan 05/27/2020, 10:31 AM   I spent a  total of 30 Minutes  in remote  clinical consultation, greater than 50% of which was  counseling/coordinating care for hemoptysis.    Visit type: Audio only (telephone). Audio (no video) only due to patient preference. Alternative for in-person consultation at [facility], [address] [place], [place], [place]. This visit type was conducted due to national recommendations for restrictions regarding the COVID-19 Pandemic (e.g. social distancing).  This format is felt to be most appropriate for this patient at this time.  All issues noted in this document were discussed and addressed.

## 2020-05-27 NOTE — Telephone Encounter (Signed)
Spoke with Judeth Cornfield and was advised this has been taken care of. Will sign off.

## 2020-05-28 ENCOUNTER — Encounter: Payer: Self-pay | Admitting: Internal Medicine

## 2020-05-28 ENCOUNTER — Telehealth: Payer: Self-pay | Admitting: Internal Medicine

## 2020-06-08 DIAGNOSIS — I480 Paroxysmal atrial fibrillation: Secondary | ICD-10-CM | POA: Diagnosis present

## 2020-06-08 DIAGNOSIS — I2510 Atherosclerotic heart disease of native coronary artery without angina pectoris: Secondary | ICD-10-CM | POA: Diagnosis present

## 2020-06-08 DIAGNOSIS — B4489 Other forms of aspergillosis: Secondary | ICD-10-CM | POA: Diagnosis present

## 2020-06-08 DIAGNOSIS — Z87891 Personal history of nicotine dependence: Secondary | ICD-10-CM

## 2020-06-08 DIAGNOSIS — D62 Acute posthemorrhagic anemia: Secondary | ICD-10-CM | POA: Diagnosis present

## 2020-06-08 DIAGNOSIS — H900 Conductive hearing loss, bilateral: Secondary | ICD-10-CM | POA: Diagnosis present

## 2020-06-08 DIAGNOSIS — Z86001 Personal history of in-situ neoplasm of cervix uteri: Secondary | ICD-10-CM

## 2020-06-08 DIAGNOSIS — M069 Rheumatoid arthritis, unspecified: Secondary | ICD-10-CM | POA: Diagnosis present

## 2020-06-08 DIAGNOSIS — K810 Acute cholecystitis: Secondary | ICD-10-CM | POA: Diagnosis not present

## 2020-06-08 DIAGNOSIS — Z8616 Personal history of COVID-19: Secondary | ICD-10-CM

## 2020-06-08 DIAGNOSIS — K254 Chronic or unspecified gastric ulcer with hemorrhage: Secondary | ICD-10-CM | POA: Diagnosis present

## 2020-06-08 DIAGNOSIS — K8012 Calculus of gallbladder with acute and chronic cholecystitis without obstruction: Secondary | ICD-10-CM | POA: Diagnosis not present

## 2020-06-08 DIAGNOSIS — J84112 Idiopathic pulmonary fibrosis: Secondary | ICD-10-CM | POA: Diagnosis present

## 2020-06-08 DIAGNOSIS — K449 Diaphragmatic hernia without obstruction or gangrene: Secondary | ICD-10-CM | POA: Diagnosis present

## 2020-06-08 DIAGNOSIS — Z7952 Long term (current) use of systemic steroids: Secondary | ICD-10-CM

## 2020-06-08 DIAGNOSIS — K2100 Gastro-esophageal reflux disease with esophagitis, without bleeding: Secondary | ICD-10-CM | POA: Diagnosis present

## 2020-06-08 DIAGNOSIS — Z79899 Other long term (current) drug therapy: Secondary | ICD-10-CM

## 2020-06-08 DIAGNOSIS — I5032 Chronic diastolic (congestive) heart failure: Secondary | ICD-10-CM | POA: Diagnosis present

## 2020-06-08 DIAGNOSIS — I959 Hypotension, unspecified: Secondary | ICD-10-CM | POA: Diagnosis not present

## 2020-06-08 DIAGNOSIS — K5900 Constipation, unspecified: Secondary | ICD-10-CM | POA: Diagnosis not present

## 2020-06-08 DIAGNOSIS — E876 Hypokalemia: Secondary | ICD-10-CM | POA: Diagnosis not present

## 2020-06-08 DIAGNOSIS — K82A1 Gangrene of gallbladder in cholecystitis: Secondary | ICD-10-CM | POA: Diagnosis present

## 2020-06-08 DIAGNOSIS — K264 Chronic or unspecified duodenal ulcer with hemorrhage: Secondary | ICD-10-CM | POA: Diagnosis present

## 2020-06-09 ENCOUNTER — Emergency Department (HOSPITAL_COMMUNITY): Payer: 59

## 2020-06-09 ENCOUNTER — Inpatient Hospital Stay (HOSPITAL_COMMUNITY): Payer: 59

## 2020-06-09 ENCOUNTER — Encounter (HOSPITAL_COMMUNITY): Payer: Self-pay | Admitting: Emergency Medicine

## 2020-06-09 ENCOUNTER — Other Ambulatory Visit: Payer: Self-pay

## 2020-06-09 ENCOUNTER — Inpatient Hospital Stay (HOSPITAL_COMMUNITY)
Admission: EM | Admit: 2020-06-09 | Discharge: 2020-06-16 | DRG: 417 | Disposition: A | Discharge status: Home / Self Care | Payer: 59 | Attending: Internal Medicine | Admitting: Internal Medicine

## 2020-06-09 DIAGNOSIS — R0602 Shortness of breath: Secondary | ICD-10-CM

## 2020-06-09 DIAGNOSIS — K2100 Gastro-esophageal reflux disease with esophagitis, without bleeding: Secondary | ICD-10-CM | POA: Diagnosis present

## 2020-06-09 DIAGNOSIS — J8489 Other specified interstitial pulmonary diseases: Secondary | ICD-10-CM | POA: Diagnosis not present

## 2020-06-09 DIAGNOSIS — K82A1 Gangrene of gallbladder in cholecystitis: Secondary | ICD-10-CM | POA: Diagnosis not present

## 2020-06-09 DIAGNOSIS — K819 Cholecystitis, unspecified: Secondary | ICD-10-CM

## 2020-06-09 DIAGNOSIS — Z79899 Other long term (current) drug therapy: Secondary | ICD-10-CM

## 2020-06-09 DIAGNOSIS — K922 Gastrointestinal hemorrhage, unspecified: Secondary | ICD-10-CM | POA: Diagnosis not present

## 2020-06-09 DIAGNOSIS — D72825 Bandemia: Secondary | ICD-10-CM | POA: Diagnosis not present

## 2020-06-09 DIAGNOSIS — K810 Acute cholecystitis: Secondary | ICD-10-CM | POA: Diagnosis present

## 2020-06-09 DIAGNOSIS — K449 Diaphragmatic hernia without obstruction or gangrene: Secondary | ICD-10-CM | POA: Diagnosis present

## 2020-06-09 DIAGNOSIS — D62 Acute posthemorrhagic anemia: Secondary | ICD-10-CM | POA: Diagnosis not present

## 2020-06-09 DIAGNOSIS — M069 Rheumatoid arthritis, unspecified: Secondary | ICD-10-CM | POA: Diagnosis not present

## 2020-06-09 DIAGNOSIS — M359 Systemic involvement of connective tissue, unspecified: Secondary | ICD-10-CM | POA: Diagnosis not present

## 2020-06-09 DIAGNOSIS — I4891 Unspecified atrial fibrillation: Secondary | ICD-10-CM

## 2020-06-09 DIAGNOSIS — H900 Conductive hearing loss, bilateral: Secondary | ICD-10-CM

## 2020-06-09 DIAGNOSIS — K269 Duodenal ulcer, unspecified as acute or chronic, without hemorrhage or perforation: Secondary | ICD-10-CM | POA: Diagnosis not present

## 2020-06-09 DIAGNOSIS — R112 Nausea with vomiting, unspecified: Secondary | ICD-10-CM | POA: Diagnosis not present

## 2020-06-09 DIAGNOSIS — I5032 Chronic diastolic (congestive) heart failure: Secondary | ICD-10-CM | POA: Diagnosis present

## 2020-06-09 DIAGNOSIS — B449 Aspergillosis, unspecified: Secondary | ICD-10-CM | POA: Diagnosis present

## 2020-06-09 DIAGNOSIS — B4489 Other forms of aspergillosis: Secondary | ICD-10-CM | POA: Diagnosis not present

## 2020-06-09 DIAGNOSIS — D84821 Immunodeficiency due to drugs: Secondary | ICD-10-CM

## 2020-06-09 DIAGNOSIS — I5033 Acute on chronic diastolic (congestive) heart failure: Secondary | ICD-10-CM | POA: Diagnosis present

## 2020-06-09 DIAGNOSIS — J84112 Idiopathic pulmonary fibrosis: Secondary | ICD-10-CM | POA: Diagnosis present

## 2020-06-09 DIAGNOSIS — K8000 Calculus of gallbladder with acute cholecystitis without obstruction: Secondary | ICD-10-CM

## 2020-06-09 DIAGNOSIS — Z87891 Personal history of nicotine dependence: Secondary | ICD-10-CM | POA: Diagnosis not present

## 2020-06-09 DIAGNOSIS — I959 Hypotension, unspecified: Secondary | ICD-10-CM | POA: Diagnosis not present

## 2020-06-09 DIAGNOSIS — K5900 Constipation, unspecified: Secondary | ICD-10-CM | POA: Diagnosis not present

## 2020-06-09 DIAGNOSIS — J479 Bronchiectasis, uncomplicated: Secondary | ICD-10-CM

## 2020-06-09 DIAGNOSIS — R042 Hemoptysis: Secondary | ICD-10-CM

## 2020-06-09 DIAGNOSIS — I480 Paroxysmal atrial fibrillation: Secondary | ICD-10-CM | POA: Diagnosis present

## 2020-06-09 DIAGNOSIS — R933 Abnormal findings on diagnostic imaging of other parts of digestive tract: Secondary | ICD-10-CM

## 2020-06-09 DIAGNOSIS — Z86001 Personal history of in-situ neoplasm of cervix uteri: Secondary | ICD-10-CM | POA: Diagnosis not present

## 2020-06-09 DIAGNOSIS — Z7952 Long term (current) use of systemic steroids: Secondary | ICD-10-CM | POA: Diagnosis not present

## 2020-06-09 DIAGNOSIS — I2510 Atherosclerotic heart disease of native coronary artery without angina pectoris: Secondary | ICD-10-CM | POA: Diagnosis present

## 2020-06-09 DIAGNOSIS — Z8616 Personal history of COVID-19: Secondary | ICD-10-CM | POA: Diagnosis not present

## 2020-06-09 DIAGNOSIS — D649 Anemia, unspecified: Secondary | ICD-10-CM | POA: Diagnosis not present

## 2020-06-09 DIAGNOSIS — I700 Atherosclerosis of aorta: Secondary | ICD-10-CM | POA: Diagnosis present

## 2020-06-09 DIAGNOSIS — J984 Other disorders of lung: Secondary | ICD-10-CM | POA: Diagnosis not present

## 2020-06-09 DIAGNOSIS — E876 Hypokalemia: Secondary | ICD-10-CM | POA: Diagnosis not present

## 2020-06-09 DIAGNOSIS — J439 Emphysema, unspecified: Secondary | ICD-10-CM

## 2020-06-09 DIAGNOSIS — I2584 Coronary atherosclerosis due to calcified coronary lesion: Secondary | ICD-10-CM

## 2020-06-09 DIAGNOSIS — K264 Chronic or unspecified duodenal ulcer with hemorrhage: Secondary | ICD-10-CM | POA: Diagnosis present

## 2020-06-09 DIAGNOSIS — K8012 Calculus of gallbladder with acute and chronic cholecystitis without obstruction: Secondary | ICD-10-CM | POA: Diagnosis not present

## 2020-06-09 DIAGNOSIS — K254 Chronic or unspecified gastric ulcer with hemorrhage: Secondary | ICD-10-CM | POA: Diagnosis present

## 2020-06-09 DIAGNOSIS — K259 Gastric ulcer, unspecified as acute or chronic, without hemorrhage or perforation: Secondary | ICD-10-CM | POA: Diagnosis not present

## 2020-06-09 LAB — CBC
HCT: 37.1 % (ref 36.0–46.0)
Hemoglobin: 12 g/dL (ref 12.0–15.0)
MCH: 28.7 pg (ref 26.0–34.0)
MCHC: 32.3 g/dL (ref 30.0–36.0)
MCV: 88.8 fL (ref 80.0–100.0)
Platelets: 302 10*3/uL (ref 150–400)
RBC: 4.18 MIL/uL (ref 3.87–5.11)
RDW: 13.6 % (ref 11.5–15.5)
WBC: 14.3 10*3/uL — ABNORMAL HIGH (ref 4.0–10.5)
nRBC: 0 % (ref 0.0–0.2)

## 2020-06-09 LAB — PREALBUMIN: Prealbumin: 12.9 mg/dL — ABNORMAL LOW (ref 18–38)

## 2020-06-09 LAB — URINALYSIS, ROUTINE W REFLEX MICROSCOPIC
Bacteria, UA: NONE SEEN
Bilirubin Urine: NEGATIVE
Glucose, UA: NEGATIVE mg/dL
Hgb urine dipstick: NEGATIVE
Ketones, ur: 5 mg/dL — AB
Leukocytes,Ua: NEGATIVE
Nitrite: NEGATIVE
Protein, ur: 30 mg/dL — AB
Specific Gravity, Urine: 1.031 — ABNORMAL HIGH (ref 1.005–1.030)
pH: 6 (ref 5.0–8.0)

## 2020-06-09 LAB — HIV ANTIBODY (ROUTINE TESTING W REFLEX): HIV Screen 4th Generation wRfx: NONREACTIVE

## 2020-06-09 LAB — COMPREHENSIVE METABOLIC PANEL
ALT: 12 U/L (ref 0–44)
AST: 18 U/L (ref 15–41)
Albumin: 3.8 g/dL (ref 3.5–5.0)
Alkaline Phosphatase: 126 U/L (ref 38–126)
Anion gap: 12 (ref 5–15)
BUN: 15 mg/dL (ref 6–20)
CO2: 27 mmol/L (ref 22–32)
Calcium: 9.3 mg/dL (ref 8.9–10.3)
Chloride: 97 mmol/L — ABNORMAL LOW (ref 98–111)
Creatinine, Ser: 0.71 mg/dL (ref 0.44–1.00)
GFR calc Af Amer: >60 mL/min (ref >60)
GFR calc non Af Amer: >60 mL/min (ref >60)
Glucose, Bld: 162 mg/dL — ABNORMAL HIGH (ref 70–99)
Potassium: 4.1 mmol/L (ref 3.5–5.1)
Sodium: 136 mmol/L (ref 135–145)
Total Bilirubin: 0.6 mg/dL (ref 0.3–1.2)
Total Protein: 7.8 g/dL (ref 6.5–8.1)

## 2020-06-09 LAB — MAGNESIUM: Magnesium: 1.9 mg/dL (ref 1.7–2.4)

## 2020-06-09 LAB — RESPIRATORY PANEL BY RT PCR (FLU A&B, COVID)
Influenza A by PCR: NEGATIVE
Influenza B by PCR: NEGATIVE
SARS Coronavirus 2 by RT PCR: NEGATIVE

## 2020-06-09 LAB — PHOSPHORUS: Phosphorus: 2.5 mg/dL (ref 2.5–4.6)

## 2020-06-09 LAB — LIPASE, BLOOD: Lipase: 21 U/L (ref 11–51)

## 2020-06-09 LAB — MRSA PCR SCREENING: MRSA by PCR: NEGATIVE

## 2020-06-09 MED ORDER — HYDROCODONE-HOMATROPINE 5-1.5 MG/5ML PO SYRP: 5.0000 mL | ORAL_SOLUTION | Freq: Four times a day (QID) | ORAL | Status: DC | PRN

## 2020-06-09 MED ORDER — LACTATED RINGERS IV BOLUS: 1000.0000 mL | Freq: Once | INTRAVENOUS | Status: DC

## 2020-06-09 MED ORDER — FENTANYL CITRATE (PF) 100 MCG/2ML IJ SOLN: 25.0000 ug | INTRAMUSCULAR | Status: DC | PRN

## 2020-06-09 MED ORDER — METHOCARBAMOL 1000 MG/10ML IJ SOLN
1000.0000 mg | Freq: Four times a day (QID) | INTRAVENOUS | Status: DC | PRN
  Filled 2020-06-09: qty 10

## 2020-06-09 MED ORDER — HYDROXYCHLOROQUINE SULFATE 200 MG PO TABS
200.0000 mg | ORAL_TABLET | Freq: Two times a day (BID) | ORAL | Status: DC
  Administered 2020-06-09 – 2020-06-16 (×14): 200 mg via ORAL
  Filled 2020-06-09 (×15): qty 1

## 2020-06-09 MED ORDER — OXYCODONE HCL 5 MG PO TABS
5.0000 mg | ORAL_TABLET | ORAL | Status: DC | PRN
  Administered 2020-06-10 – 2020-06-15 (×7): 5 mg via ORAL
  Filled 2020-06-09 (×8): qty 1

## 2020-06-09 MED ORDER — LIP MEDEX EX OINT
1.0000 "application " | TOPICAL_OINTMENT | Freq: Two times a day (BID) | CUTANEOUS | Status: DC
  Administered 2020-06-09 – 2020-06-16 (×11): 1 via TOPICAL
  Filled 2020-06-09 (×4): qty 7

## 2020-06-09 MED ORDER — VORICONAZOLE 200 MG PO TABS
200.0000 mg | ORAL_TABLET | Freq: Two times a day (BID) | ORAL | Status: DC
  Administered 2020-06-09 – 2020-06-16 (×14): 200 mg via ORAL
  Filled 2020-06-09 (×15): qty 1

## 2020-06-09 MED ORDER — DIPHENHYDRAMINE HCL 50 MG/ML IJ SOLN
12.5000 mg | Freq: Four times a day (QID) | INTRAMUSCULAR | Status: DC | PRN
  Administered 2020-06-11 – 2020-06-14 (×3): 25 mg via INTRAVENOUS
  Filled 2020-06-09 (×3): qty 1

## 2020-06-09 MED ORDER — MENTHOL 3 MG MT LOZG
1.0000 | LOZENGE | OROMUCOSAL | Status: DC | PRN
  Filled 2020-06-09: qty 9

## 2020-06-09 MED ORDER — SODIUM CHLORIDE 0.9 % IV SOLN
2.0000 g | Freq: Three times a day (TID) | INTRAVENOUS | Status: DC
  Administered 2020-06-09 – 2020-06-16 (×21): 2 g via INTRAVENOUS
  Filled 2020-06-09 (×23): qty 2

## 2020-06-09 MED ORDER — ONDANSETRON 4 MG PO TBDP
4.0000 mg | ORAL_TABLET | Freq: Once | ORAL | Status: AC | PRN
  Administered 2020-06-09: 4 mg via ORAL
  Filled 2020-06-09: qty 1

## 2020-06-09 MED ORDER — ESCITALOPRAM OXALATE 10 MG PO TABS
10.0000 mg | ORAL_TABLET | Freq: Every day | ORAL | Status: DC
  Administered 2020-06-09 – 2020-06-16 (×7): 10 mg via ORAL
  Filled 2020-06-09 (×7): qty 1

## 2020-06-09 MED ORDER — METRONIDAZOLE IN NACL 5-0.79 MG/ML-% IV SOLN
500.0000 mg | Freq: Four times a day (QID) | INTRAVENOUS | Status: DC
  Administered 2020-06-09 – 2020-06-16 (×27): 500 mg via INTRAVENOUS
  Filled 2020-06-09 (×25): qty 100

## 2020-06-09 MED ORDER — ACETAMINOPHEN 650 MG RE SUPP: 650.0000 mg | Freq: Four times a day (QID) | RECTAL | Status: DC | PRN

## 2020-06-09 MED ORDER — PREDNISONE 2.5 MG PO TABS
2.5000 mg | ORAL_TABLET | Freq: Every day | ORAL | Status: DC
  Administered 2020-06-09: 2.5 mg via ORAL
  Filled 2020-06-09 (×2): qty 1

## 2020-06-09 MED ORDER — SODIUM CHLORIDE (PF) 0.9 % IJ SOLN
INTRAMUSCULAR | Status: AC
  Filled 2020-06-09: qty 50

## 2020-06-09 MED ORDER — SODIUM CHLORIDE 0.9 % IV SOLN
1.0000 g | Freq: Once | INTRAVENOUS | Status: DC
  Administered 2020-06-09: 1 g via INTRAVENOUS
  Filled 2020-06-09: qty 10

## 2020-06-09 MED ORDER — PROCHLORPERAZINE EDISYLATE 10 MG/2ML IJ SOLN
5.0000 mg | INTRAMUSCULAR | Status: DC | PRN
  Administered 2020-06-09: 10 mg via INTRAVENOUS
  Filled 2020-06-09: qty 2

## 2020-06-09 MED ORDER — ENOXAPARIN SODIUM 40 MG/0.4ML ~~LOC~~ SOLN
40.0000 mg | SUBCUTANEOUS | Status: DC
  Administered 2020-06-09: 40 mg via SUBCUTANEOUS
  Filled 2020-06-09: qty 0.4

## 2020-06-09 MED ORDER — METOPROLOL TARTRATE 5 MG/5ML IV SOLN: 5.0000 mg | Freq: Four times a day (QID) | INTRAVENOUS | Status: DC | PRN

## 2020-06-09 MED ORDER — ONDANSETRON HCL 4 MG/2ML IJ SOLN
4.0000 mg | Freq: Four times a day (QID) | INTRAMUSCULAR | Status: DC | PRN
  Administered 2020-06-10 – 2020-06-12 (×2): 4 mg via INTRAVENOUS
  Filled 2020-06-09 (×4): qty 2

## 2020-06-09 MED ORDER — ONDANSETRON HCL 4 MG/2ML IJ SOLN
4.0000 mg | Freq: Once | INTRAMUSCULAR | Status: AC
  Administered 2020-06-09: 4 mg via INTRAVENOUS
  Filled 2020-06-09: qty 2

## 2020-06-09 MED ORDER — SODIUM CHLORIDE 0.9 % IV SOLN
8.0000 mg | Freq: Four times a day (QID) | INTRAVENOUS | Status: DC | PRN
  Filled 2020-06-09: qty 4

## 2020-06-09 MED ORDER — LACTATED RINGERS IV BOLUS: 1000.0000 mL | Freq: Three times a day (TID) | INTRAVENOUS | Status: DC | PRN

## 2020-06-09 MED ORDER — MORPHINE SULFATE (PF) 4 MG/ML IV SOLN
4.0000 mg | Freq: Once | INTRAVENOUS | Status: AC
  Administered 2020-06-09: 4 mg via INTRAVENOUS
  Filled 2020-06-09: qty 1

## 2020-06-09 MED ORDER — PHENOL 1.4 % MT LIQD
2.0000 | OROMUCOSAL | Status: DC | PRN
  Filled 2020-06-09: qty 177

## 2020-06-09 MED ORDER — LORAZEPAM 2 MG/ML IJ SOLN
1.0000 mg | INTRAMUSCULAR | Status: AC | PRN
  Administered 2020-06-09: 1 mg via INTRAVENOUS
  Filled 2020-06-09: qty 1

## 2020-06-09 MED ORDER — IOHEXOL 300 MG/ML  SOLN
100.0000 mL | Freq: Once | INTRAMUSCULAR | Status: AC | PRN
  Administered 2020-06-09: 100 mL via INTRAVENOUS

## 2020-06-09 MED ORDER — ALUM & MAG HYDROXIDE-SIMETH 200-200-20 MG/5ML PO SUSP
30.0000 mL | Freq: Four times a day (QID) | ORAL | Status: DC | PRN
  Administered 2020-06-09 – 2020-06-14 (×3): 30 mL via ORAL
  Filled 2020-06-09 (×3): qty 30

## 2020-06-09 MED ORDER — ACETAMINOPHEN 325 MG PO TABS
650.0000 mg | ORAL_TABLET | Freq: Four times a day (QID) | ORAL | Status: DC | PRN
  Administered 2020-06-10: 650 mg via ORAL
  Filled 2020-06-09: qty 2

## 2020-06-09 MED ORDER — ACETAMINOPHEN 500 MG PO TABS
1000.0000 mg | ORAL_TABLET | Freq: Three times a day (TID) | ORAL | Status: DC
  Administered 2020-06-09 – 2020-06-16 (×20): 1000 mg via ORAL
  Filled 2020-06-09 (×20): qty 2

## 2020-06-09 MED ORDER — MORPHINE SULFATE (PF) 2 MG/ML IV SOLN
2.0000 mg | INTRAVENOUS | Status: DC | PRN
  Administered 2020-06-09 – 2020-06-10 (×4): 2 mg via INTRAVENOUS
  Filled 2020-06-09 (×4): qty 1

## 2020-06-09 MED ORDER — SODIUM CHLORIDE 0.9 % IV BOLUS
1000.0000 mL | Freq: Once | INTRAVENOUS | Status: AC
  Administered 2020-06-09: 1000 mL via INTRAVENOUS

## 2020-06-09 MED ORDER — GADOBUTROL 1 MMOL/ML IV SOLN
6.0000 mL | Freq: Once | INTRAVENOUS | Status: AC | PRN
  Administered 2020-06-09: 6 mL via INTRAVENOUS

## 2020-06-09 MED ORDER — SODIUM CHLORIDE 0.9 % IV SOLN: INTRAVENOUS | Status: AC

## 2020-06-09 MED ORDER — MAGIC MOUTHWASH
15.0000 mL | Freq: Four times a day (QID) | ORAL | Status: DC | PRN
  Filled 2020-06-09: qty 15

## 2020-06-09 NOTE — ED Triage Notes (Signed)
Pt reports having vomiting and abdominal pain that started after eating at a new restaurant.

## 2020-06-09 NOTE — H&P (View-Only) (Signed)
[facility] Surgery Consult Note  Kimberly Shelton 1961/01/11  [id number].    Requesting MD: Dr. Judd Lien  Chief Complaint/Reason for Consult: Acute cholecystitis  HPI:  Patient is a 59 year old female who presented to [facility] with upper abdominal pain since yesterday. Pain reported as severe and constant since onset. Does not radiate to back or anywhere else. Associated subjective fever, chills, nausea, vomiting. Denies chest pain, SOB, constipation, diarrhea, urinary symptoms. She tried taking a medication for indigestion without relief. Patient reports she has been having more mild symptoms similar to this intermittently for about 6 months and probably vomits at least once weekly. She also reports chronic hemoptysis related to a fungal PNA that occurred in 2015 with scarring of RUL and LLL. She follows with pulmonology for this and is not currently on home O2. PMH otherwise significant for RA on daily prednisone, CHF. Last ECHO earlier this year with EF 60-65%. She is not on any blood thinning medications. No past abdominal surgery.   ROS: Review of Systems  Constitutional: Positive for chills and fever.  Respiratory: Negative for shortness of breath and wheezing.   Cardiovascular: Negative for chest pain and palpitations.  Gastrointestinal: Positive for abdominal pain, nausea and vomiting. Negative for blood in stool, constipation, diarrhea and melena.  Genitourinary: Negative for dysuria, frequency and urgency.  All other systems reviewed and are negative.   Family History  Problem Relation Age of Onset   Diabetes Mother    Rheum arthritis Mother    Diabetes Father    COPD Father    Bronchitis Father    Cancer Brother        Lung   Breast cancer Maternal Aunt 62   CAD Neg Hx     Past Medical History:  Diagnosis Date   Abnormal Pap smear 09/2006   HGSIL CIN 2/VAIN/ CIN 3/VAIN-3 CIS   CHF (congestive heart failure) (HCC)    Foot fracture, left 10/16   hair-line  fracture   History of pneumonia 02/09/2016   Pneumonia    was hospitalized   RA (rheumatoid arthritis) (HCC) 07/2009    Past Surgical History:  Procedure Laterality Date   BRONCHIAL WASHINGS  11/27/2019   Procedure: BRONCHIAL WASHINGS;  Surgeon: Kalman Shan, MD;  Location: [facility];  Service: Cardiopulmonary;;   CERVICAL BIOPSY  W/ LOOP ELECTRODE EXCISION  2008   CIN 3   CERVICAL CONIZATION W/BX N/A 12/15/2015   Procedure: CONIZATION CERVIX WITH BIOPSY ;  Surgeon: Jerene Bears, MD;  Location: [facility];  Service: Gynecology;  Laterality: N/A;  POSSIBLE COLD KNIFE CONE.  Please have the microscope in the room   COLONOSCOPY     IR RADIOLOGIST EVAL & MGMT  05/27/2020   LEEP N/A 12/15/2015   Procedure: LOOP ELECTROSURGICAL EXCISION PROCEDURE (LEEP) with colpo;  Surgeon: Jerene Bears, MD;  Location: [facility];  Service: Gynecology;  Laterality: N/A;   PILONIDAL CYST EXCISION  1999   I&D   VIDEO BRONCHOSCOPY N/A 11/27/2019   Procedure: VIDEO BRONCHOSCOPY WITHOUT FLUORO;  Surgeon: Kalman Shan, MD;  Location: [facility];  Service: Cardiopulmonary;  Laterality: N/A;   WISDOM TOOTH EXTRACTION      Social History:  reports that she quit smoking about 6 years ago. Her smoking use included cigarettes and e-cigarettes. She started smoking about 36 years ago. She has a 30.00 pack-year smoking history. She has never used smokeless tobacco. She reports that she does not drink alcohol and does not use drugs.   Allergies:  Allergies  Allergen Reactions   Bee Venom Anaphylaxis   Codeine Other (See Comments)    "seeing spots"   Pirfenidone Other (See Comments)    GI side effects   Penicillins Rash    Has patient had a PCN reaction causing immediate rash, facial/tongue/throat swelling, SOB or lightheadedness with hypotension: No Has patient had a PCN reaction causing severe rash involving mucus membranes or skin necrosis: No Has patient had a PCN reaction that required  hospitalization No Has patient had a PCN reaction occurring within the last 10 years: Yes If all of the above answers are "NO", then may proceed with Cephalosporin use.     Blood pressure 129/60, pulse 78, temperature 98.9 F (37.2 C), temperature source Oral, resp. rate 18, height  (1.6 m), weight 65.3 kg, last menstrual period 09/06/2004, SpO2 98 %. Physical Exam:  General: pleasant, WD, WN female who is laying in bed in NAD HEENT: Sclera are anicteric.  PERRL.  Ears and nose without any masses or lesions.  Mouth is pink and moist Heart: regular, rate, and rhythm. Palpable radial and pedal pulses bilaterally Lungs: CTAB, no wheezes, rhonchi, or rales noted.  Respiratory effort nonlabored Abd: soft, TTP across upper abdomen and in LLQ, +murphy sign, ND, +BS MS: all 4 extremities are symmetrical with no cyanosis, clubbing, or edema. Skin: warm and dry with no masses, lesions, or rashes Neuro: Cranial nerves 2-12 grossly intact, sensation grossly intact throughout Psych: A&Ox3 with an appropriate affect.   Results for orders placed or performed during the hospital encounter of 06/09/20 (from the past 48 hour(s))  Lipase, blood     Status: None   Collection Time: 06/09/20  1:16 AM  Result Value Ref Range   Lipase 21 11 - 51 U/L    Comment: Performed at [facility], [address] [place], [place] [address]  Comprehensive metabolic panel     Status: Abnormal   Collection Time: 06/09/20  1:16 AM  Result Value Ref Range   Sodium 136 135 - 145 mmol/L   Potassium 4.1 3.5 - 5.1 mmol/L   Chloride 97 (L) 98 - 111 mmol/L   CO2 27 22 - 32 mmol/L   Glucose, Bld 162 (H) 70 - 99 mg/dL    Comment: Glucose reference range applies only to samples taken after fasting for at least 8 hours.   BUN 15 6 - 20 mg/dL   Creatinine, Ser 6.04 0.44 - 1.00 mg/dL   Calcium 9.3 8.9 - 54.0 mg/dL   Total Protein 7.8 6.5 - 8.1 g/dL   Albumin 3.8 3.5 - 5.0 g/dL   AST 18 15 - 41 U/L   ALT  12 0 - 44 U/L   Alkaline Phosphatase 126 38 - 126 U/L   Total Bilirubin 0.6 0.3 - 1.2 mg/dL   GFR calc non Af Amer >60 >60 mL/min   GFR calc Af Amer >60 >60 mL/min   Anion gap 12 5 - 15    Comment: Performed at [facility], [address], [place], [place] [address]  CBC     Status: Abnormal   Collection Time: 06/09/20  1:16 AM  Result Value Ref Range   WBC 14.3 (H) 4.0 - 10.5 K/uL   RBC 4.18 3.87 - 5.11 MIL/uL   Hemoglobin 12.0 12.0 - 15.0 g/dL   HCT 14.7 36 - 46 %   MCV 88.8 80.0 - 100.0 fL   MCH 28.7 26.0 - 34.0 pg   MCHC  32.3 30.0 - 36.0 g/dL   RDW 41.9 37.9 - 02.4 %   Platelets 302 150 - 400 K/uL   nRBC 0.0 0.0 - 0.2 %    Comment: Performed at [facility], [address], [place], [place] [address]  Urinalysis, Routine w reflex microscopic     Status: Abnormal   Collection Time: 06/09/20  1:17 AM  Result Value Ref Range   Color, Urine YELLOW YELLOW   APPearance CLEAR CLEAR   Specific Gravity, Urine 1.031 (H) 1.005 - 1.030   pH 6.0 5.0 - 8.0   Glucose, UA NEGATIVE NEGATIVE mg/dL   Hgb urine dipstick NEGATIVE NEGATIVE   Bilirubin Urine NEGATIVE NEGATIVE   Ketones, ur 5 (A) NEGATIVE mg/dL   Protein, ur 30 (A) NEGATIVE mg/dL   Nitrite NEGATIVE NEGATIVE   Leukocytes,Ua NEGATIVE NEGATIVE   RBC / HPF 0-5 0 - 5 RBC/hpf   WBC, UA 0-5 0 - 5 WBC/hpf   Bacteria, UA NONE SEEN NONE SEEN   Squamous Epithelial / LPF 0-5 0 - 5   Mucus PRESENT     Comment: Performed at [facility] [address], [place], [place] [address]  Respiratory Panel by RT PCR (Flu A&B, Covid) - Nasopharyngeal Swab     Status: None   Collection Time: 06/09/20  6:03 AM   Specimen: Nasopharyngeal Swab  Result Value Ref Range   SARS Coronavirus 2 by RT PCR NEGATIVE NEGATIVE    Comment: (NOTE) SARS-CoV-2 target nucleic acids are NOT DETECTED.  The SARS-CoV-2 RNA is generally detectable in upper respiratoy specimens during the acute phase of  infection. The lowest concentration of SARS-CoV-2 viral copies this assay can detect is 131 copies/mL. A negative result does not preclude SARS-Cov-2 infection and should not be used as the sole basis for treatment or other patient management decisions. A negative result may occur with  improper specimen collection/handling, submission of specimen other than nasopharyngeal swab, presence of viral mutation(s) within the areas targeted by this assay, and inadequate number of viral copies (<131 copies/mL). A negative result must be combined with clinical observations, patient history, and epidemiological information. The expected result is Negative.  Fact Sheet for Patients:  https://www.moore.com/  Fact Sheet for Healthcare Providers:  https://www.young.biz/  This test is no t yet approved or cleared by the [place] FDA and  has been authorized for detection and/or diagnosis of SARS-CoV-2 by FDA under an Emergency Use Authorization (EUA). This EUA will remain  in effect (meaning this test can be used) for the duration of the COVID-19 declaration under Section 564(b)(1) of the Act, 21 U.S.C. section 360bbb-3(b)(1), unless the authorization is terminated or revoked sooner.     Influenza A by PCR NEGATIVE NEGATIVE   Influenza B by PCR NEGATIVE NEGATIVE    Comment: (NOTE) The Xpert Xpress SARS-CoV-2/FLU/RSV assay is intended as an aid in  the diagnosis of influenza from Nasopharyngeal swab specimens and  should not be used as a sole basis for treatment. Nasal washings and  aspirates are unacceptable for Xpert Xpress SARS-CoV-2/FLU/RSV  testing.  Fact Sheet for Patients: https://www.moore.com/  Fact Sheet for Healthcare Providers: https://www.young.biz/  This test is not yet approved or cleared by the [place] FDA and  has been authorized for detection and/or diagnosis of SARS-CoV-2 by  FDA  under an Emergency Use Authorization (EUA). This EUA will remain  in effect (meaning this test can be used) for the duration of the  Covid-19 declaration under Section 564(b)(1)  of the Act, 21  U.S.C. section 360bbb-3(b)(1), unless the authorization is  terminated or revoked. Performed at [facility], [address], [place], [place] [address]   Magnesium     Status: None   Collection Time: 06/09/20  6:35 AM  Result Value Ref Range   Magnesium 1.9 1.7 - 2.4 mg/dL    Comment: Performed at [facility], [address], [place], [place] [address]  Phosphorus     Status: None   Collection Time: 06/09/20  6:35 AM  Result Value Ref Range   Phosphorus 2.5 2.5 - 4.6 mg/dL    Comment: Performed at [facility], [address], [place], [place] [address]   CT ABDOMEN PELVIS W CONTRAST  Result Date: 06/09/2020 CLINICAL DATA:  Vomiting and abdominal pain. EXAM: CT ABDOMEN AND PELVIS WITH CONTRAST TECHNIQUE: Multidetector CT imaging of the abdomen and pelvis was performed using the standard protocol following bolus administration of intravenous contrast. CONTRAST:  OMNIPAQUE IOHEXOL 300 MG/ML  SOLN COMPARISON:  CT chest 09/26/2019 FINDINGS: Lower chest: 10 mm nodule at the left base (image 19/series 4) is stable. Chronic peripheral interstitial lung disease evident, better characterized on previous chest CT as consistent with UIP. Hepatobiliary: Mild intrahepatic biliary duct prominence. Small area of low attenuation in the anterior liver, adjacent to the falciform ligament, is in a characteristic location for focal fatty deposition. Heterogeneous perfusion in the liver along the gallbladder fossa with some probable periportal edema inferiorly towards the gallbladder fundus. Gallbladder wall thickening is associated with multiple gallstones measuring up to about 10 mm diameter. There is pericholecystic edema/inflammation with fluid in the  hepatoduodenal ligament/porta hepatis. Common bile duct measures 8 mm in the head of pancreas. Pancreas: Main pancreatic duct is distended up to 6 mm in the head of pancreas. No obstructing pancreatic head mass evident. Spleen: No splenomegaly. No focal mass lesion. Adrenals/Urinary Tract: No adrenal nodule or mass. Kidneys unremarkable. No evidence for hydroureter. The urinary bladder appears normal for the degree of distention. Stomach/Bowel: Tiny hiatal hernia. Stomach otherwise unremarkable. Duodenum is normally positioned as is the ligament of Treitz. No small bowel wall thickening. No small bowel dilatation. The terminal ileum is normal. The appendix is normal. No gross colonic mass. No colonic wall thickening. Vascular/Lymphatic: There is abdominal aortic atherosclerosis without aneurysm. There is no gastrohepatic or hepatoduodenal ligament lymphadenopathy. No retroperitoneal or mesenteric lymphadenopathy. No pelvic sidewall lymphadenopathy. Reproductive: The uterus is unremarkable.  There is no adnexal mass. Other: No gross intraperitoneal free fluid. Musculoskeletal: No worrisome lytic or sclerotic osseous abnormality. IMPRESSION: 1. Cholelithiasis with gallbladder wall thickening, pericholecystic edema, and fluid in the hepatoduodenal ligament/porta hepatis. Imaging features are compatible with acute cholecystitis. 2. Mild intrahepatic biliary duct prominence with dilatation of the main pancreatic duct up to 6 mm in the head of pancreas. No choledocholithiasis apparent by CT although stones are not densely calcified. No obstructing pancreatic head mass evident. ERCP or MRCP may prove helpful to further evaluate. 3. Stable 10 mm left lower lobe pulmonary nodule with chronic peripheral interstitial lung disease, better characterized on previous chest CT as consistent with UIP. Please see that report. 4. Tiny hiatal hernia. 5. Aortic Atherosclerosis (ICD10-I70.0). Electronically Signed   By: Kennith Center  M.D.   On: 06/09/2020 05:47      Assessment/Plan RA on daily prednisone and plaquenil Hx of fungal PNA followed by pulmonology, on chronic voriconazole  CHF - EF 60-65%  Acute cholecystitis  - CT today  showed cholelithiasis with gallbladder wall thickening and pericholecystic fluid, mild intrahepatic ductal dilatation  - WBC 14, afebrile - LFTs WNL, Tbili WNL - pulmonary clearance, keep on IV abx, ok to have CLD today, will plan for lap chole tomorrow   FEN: CLD, IVF - NPO after MN for OR tomorrow  VTE: lovenox ID: [id number]  Admit to [facility] and general surgery will follow.   Juliet Rude, [facility] 06/09/2020, 8:20 AM Please see Amion for pager number during day hours 7:00am-4:30pm

## 2020-06-09 NOTE — Consult Note (Signed)
NAME:  Kimberly Shelton, MRN:  [id number], DOB:  1961/04/07, LOS: 0 ADMISSION DATE:  06/09/2020, CONSULTATION DATE:  10/4 REFERRING MD:  Carolynne Edouard, CHIEF COMPLAINT:  Pulmonary clearance    Brief History   59 year old female admitted 10/4 w/ cc: 1 day h/o abd pain w/ associated fever, chills, N&V. Dx eval showing cholecystitis w/ cholelithiasis as well as pancreatic inflammation. She was seen by surgery who feel she would be best served by Cholecystectomy.  She is followed by Dr [name] since 2015  for ILD and aspergilloma    Past Medical History  RA ->now off immunosuppression and just on prednisone since 2019 2019 CT finding on chest bringing concern for RUL cavitation worrisome for aspergilloma. FEV 64%, Aspergillus antibody positive. So was referred to Infectious disease. Placed on Voriconazole Jan 2021 having Hemoptysis ->restarted Vori feb 20212 Did have Covid in Nov 2019 Placed on Pirfenidone April 2021 for ILD (progressive UIP pattern) but stopped due to GI complications now on Nitedanib    Significant Hospital Events   10/4 admitted  Consults:  pulm 10/4  Procedures:    Significant Diagnostic Tests:  Ct abd/pelvis 10/4: 1. Cholelithiasis with gallbladder wall thickening, pericholecystic edema, and fluid in the hepatoduodenal ligament/porta hepatis. Imaging features are compatible with acute cholecystitis. 2. Mild intrahepatic biliary duct prominence with dilatation of the main pancreatic duct up to 6 mm in the head of pancreas. No choledocholithiasis apparent by CT although stones are not densely calcified. No obstructing pancreatic head mass evident. ERCP or MRCP may prove helpful to further evaluate. 3. Stable 10 mm left lower lobe pulmonary nodule with chronic peripheral interstitial lung disease, better characterized on previous chest CT as consistent with UIP. Please see that report. 4. Tiny hiatal hernia. 5. Aortic Atherosclerosis (ICD10-I70.0).  MR abd MRCP  10/4: Cholelithiasis with suspected acute cholecystitis. Dilated common duct, measuring 11 mm proximally. Suspected 5 mm distal CBD stone at the ampulla, although equivocal. Fluid/stranding along the right mid abdominal mesentery and along the pancreatic head, favored to be secondary to the right upper quadrant inflammatory process. Micro Data:   Antimicrobials:  Cefepime 10/4>>> Flagyl 10/4>>  Interim history/subjective:   Still c/o intermittent nausea and abd pain Objective   Blood pressure (Abnormal) 124/50, pulse 77, temperature 97.7 F (36.5 C), temperature source Oral, resp. rate 18, height 5\' 3"  (1.6 m), weight 65.3 kg, last menstrual period 09/06/2004, SpO2 100 %.       No intake or output data in the 24 hours ending 06/09/20 1224 Filed Weights   06/09/20 0048  Weight: 65.3 kg    Examination: General: 59 year old female who actually appears acutely ill HENT: NCAT no JVD  Lungs: decreased bases, no accessory use  Cardiovascular: RRR Abdomen: uncomfortable to palp. + bowel sounds Extremities: warm and dry  Neuro: awake and oriented  GU:  voids  Resolved Hospital Problem list    Assessment & Plan:  Acute Cholecystitis w/ Cholelithiasis Plan Cont abx Proceed w/ surgery per general surg recs. Risk of consequences from acute pancreatitis and GI related sepsis outweigh potential exacerbation of her chronic pulmonary disease process.   H/o ILD. Progressive UIP pattern Maintained on Nintedanib  Not on oxygen as of yet There is no absolute contraindication to proceed w/ surgery from a pulmonary stand point. She is at higher risk for post-operative respiratory failure, prolonged ventilator need and even Pulmonary infection as   Plan Pulse ox  IS O2 as needed.  Hold her Nintedanib  H/o  RA -she is on chronic steroids Plan Would recommend stress dose steroids after surgery. Suspect she has limited adrenal response if any   H/o aspergilloma w/ chronic BTX and  associated chronic hemoptysis (since 2019) -coughed up about a teaspoon of blood this am Plan Continue Vori  Holding LMWH as still having hemoptysis  If she were to have uncontrolled pulmonary hemorrhage she would need emergent IR consultation.  CXR preop (ordered)  Best practice:  Diet: cl  Pain/Anxiety/Delirium protocol (if indicated): NA VAP protocol (if indicated): NA DVT prophylaxis:  GI prophylaxis: PPI Glucose control: NA Mobility: as tol Code Status: full code  Family Communication: per primary  Disposition: awaiting surg   Labs   CBC: Recent Labs  Lab 06/09/20 0116 06/09/20 1009  WBC 14.3* 16.0*  HGB 12.0 11.0*  HCT 37.1 33.9*  MCV 88.8 89.4  PLT 302 277    Basic Metabolic Panel: Recent Labs  Lab 06/09/20 0116 06/09/20 0635 06/09/20 1009  NA 136  --   --   K 4.1  --   --   CL 97*  --   --   CO2 27  --   --   GLUCOSE 162*  --   --   BUN 15  --   --   CREATININE 0.71  --  0.66  CALCIUM 9.3  --   --   MG  --  1.9  --   PHOS  --  2.5  --    GFR: Estimated Creatinine Clearance: 68 mL/min (by C-G formula based on SCr of 0.66 mg/dL). Recent Labs  Lab 06/09/20 0116 06/09/20 1009  WBC 14.3* 16.0*    Liver Function Tests: Recent Labs  Lab 06/09/20 0116  AST 18  ALT 12  ALKPHOS 126  BILITOT 0.6  PROT 7.8  ALBUMIN 3.8   Recent Labs  Lab 06/09/20 0116  LIPASE 21   No results for input(s): AMMONIA in the last 168 hours.  ABG No results found for: PHART, PCO2ART, PO2ART, HCO3, TCO2, ACIDBASEDEF, O2SAT   Coagulation Profile: No results for input(s): INR, PROTIME in the last 168 hours.  Cardiac Enzymes: No results for input(s): CKTOTAL, CKMB, CKMBINDEX, TROPONINI in the last 168 hours.  HbA1C: No results found for: HGBA1C  CBG: No results for input(s): GLUCAP in the last 168 hours.  Review of Systems:   Review of Systems  Constitutional: Positive for chills and fever.  HENT: Negative.   Eyes: Negative.   Respiratory: Positive  for cough and hemoptysis. Negative for shortness of breath.   Cardiovascular: Negative.   Gastrointestinal: Positive for abdominal pain, heartburn, nausea and vomiting.  Genitourinary: Negative.   Musculoskeletal: Negative.   Skin: Negative.   Neurological: Negative.   Endo/Heme/Allergies: Negative.   Psychiatric/Behavioral: Negative.      Past Medical History  She,  has a past medical history of Abnormal Pap smear (09/2006), CHF (congestive heart failure) (HCC), Foot fracture, left (10/16), History of pneumonia (02/09/2016), Pneumonia, and RA (rheumatoid arthritis) (HCC) (07/2009).   Surgical History    Past Surgical History:  Procedure Laterality Date  . BRONCHIAL WASHINGS  11/27/2019   Procedure: BRONCHIAL WASHINGS;  Surgeon: Kalman Shan, MD;  Location: [facility];  Service: Cardiopulmonary;;  . CERVICAL BIOPSY  W/ LOOP ELECTRODE EXCISION  2008   CIN 3  . CERVICAL CONIZATION W/BX N/A 12/15/2015   Procedure: CONIZATION CERVIX WITH BIOPSY ;  Surgeon: Jerene Bears, MD;  Location: [facility];  Service: Gynecology;  Laterality: N/A;  POSSIBLE  COLD KNIFE CONE.  Please have the microscope in the room  . COLONOSCOPY    . IR RADIOLOGIST EVAL & MGMT  05/27/2020  . LEEP N/A 12/15/2015   Procedure: LOOP ELECTROSURGICAL EXCISION PROCEDURE (LEEP) with colpo;  Surgeon: Jerene Bears, MD;  Location: [facility];  Service: Gynecology;  Laterality: N/A;  . PILONIDAL CYST EXCISION  1999   I&D  . VIDEO BRONCHOSCOPY N/A 11/27/2019   Procedure: VIDEO BRONCHOSCOPY WITHOUT FLUORO;  Surgeon: Kalman Shan, MD;  Location: [facility];  Service: Cardiopulmonary;  Laterality: N/A;  . WISDOM TOOTH EXTRACTION       Social History   reports that she quit smoking about 6 years ago. Her smoking use included cigarettes and e-cigarettes. She started smoking about 36 years ago. She has a 30.00 pack-year smoking history. She has never used smokeless tobacco. She reports that she does not drink alcohol and does not  use drugs.   Family History   Her family history includes Breast cancer (age of onset: 14) in her maternal aunt; Bronchitis in her father; COPD in her father; Cancer in her brother; Diabetes in her father and mother; Rheum arthritis in her mother. There is no history of CAD.   Allergies Allergies  Allergen Reactions  . Bee Venom Anaphylaxis  . Codeine Other (See Comments)    "seeing spots"  . Pirfenidone Other (See Comments)    GI side effects  . Penicillins Rash    Has patient had a PCN reaction causing immediate rash, facial/tongue/throat swelling, SOB or lightheadedness with hypotension: No Has patient had a PCN reaction causing severe rash involving mucus membranes or skin necrosis: No Has patient had a PCN reaction that required hospitalization No Has patient had a PCN reaction occurring within the last 10 years: Yes If all of the above answers are "NO", then may proceed with Cephalosporin use.      Home Medications  Prior to Admission medications   Medication Sig Start Date End Date Taking? Authorizing Provider  albuterol (VENTOLIN HFA) 108 (90 Base) MCG/ACT inhaler INHALE 2 PUFFS INTO THE LUNGS EVERY 6 HOURS AS NEEDED FOR WHEEZING OR SHORTNESS OF BREATH Patient taking differently: Inhale 2 puffs into the lungs every 6 (six) hours as needed for wheezing or shortness of breath.  11/07/19  Yes Kalman Shan, MD  benzonatate (TESSALON) 200 MG capsule TAKE 1 CAPSULE(200 MG) BY MOUTH THREE TIMES DAILY AS NEEDED FOR COUGH Patient taking differently: Take 200 mg by mouth 3 (three) times daily as needed for cough. TAKE 1 CAPSULE(200 MG) BY MOUTH THREE TIMES DAILY AS NEEDED FOR COUGH 12/10/19  Yes Glenford Bayley, NP  escitalopram (LEXAPRO) 10 MG tablet TAKE 1 TABLET BY MOUTH EVERY DAY Patient taking differently: Take 10 mg by mouth daily.  05/13/20  Yes Jerene Bears, MD  hydroxychloroquine (PLAQUENIL) 200 MG tablet Take 200 mg by mouth 2 (two) times daily. 10/17/19  Yes [provider]  predniSONE (DELTASONE) 5 MG tablet Take 2.5 mg by mouth daily with breakfast.  07/10/19  Yes [provider]  voriconazole (VFEND) 200 MG tablet TAKE 1 TABLET(200 MG) BY MOUTH TWICE DAILY Patient taking differently: Take 200 mg by mouth 2 (two) times daily.  01/28/20  Yes Comer, Belia Heman, MD  EPINEPHrine (EPIPEN 2-PAK) 0.3 mg/0.3 mL IJ SOAJ injection Inject 0.3 mg into the muscle as needed for anaphylaxis.     [provider]  HYDROcodone-homatropine (HYCODAN) 5-1.5 MG/5ML syrup Take 5 mLs by mouth every 6 (six)  hours as needed for cough. Patient not taking: Reported on 06/09/2020 04/09/20   Glenford Bayley, NP  Nintedanib (OFEV) 150 MG CAPS Take 1 capsule (150 mg total) by mouth 2 (two) times daily. Patient not taking: Reported on 06/09/2020 02/05/20   Kalman Shan, MD  ondansetron (ZOFRAN) 4 MG tablet Take 1 tablet (4 mg total) by mouth 3 (three) times daily as needed for nausea. Patient not taking: Reported on 06/09/2020 01/10/20   Kalman Shan, MD     Critical care time: NA   Simonne Martinet ACNP-BC [facility] Pulmonary/Critical Care Pager # [phone] OR # [phone] if no answer

## 2020-06-09 NOTE — ED Provider Notes (Signed)
[facility] Note   CSN: [id number] Arrival date & time: 06/08/20  2358     History Chief Complaint  Patient presents with  . Abdominal Pain    Kimberly Shelton is a 59 y.o. female.  Patient is a 59 year old female with history of rheumatoid arthritis, CHF, atrial tachycardia. She presents today for evaluation of abdominal pain. Patient reports abdominal pain and vomiting x4 that started earlier this evening. She had gone out to eat with friends prior to the onset of symptoms. She denies any diarrhea or bloody stools. She denies any bloody vomit. She denies any fevers or chills. Her pain is mainly across her lower abdomen.  The history is provided by the patient.  Abdominal Pain Pain location:  RLQ, RUQ and suprapubic Pain quality: cramping   Pain radiates to:  Does not radiate Pain severity:  Moderate Onset quality:  Sudden Duration:  12 hours Timing:  Constant Progression:  Worsening Chronicity:  New Relieved by:  Nothing Worsened by:  Nothing      Past Medical History:  Diagnosis Date  . Abnormal Pap smear 09/2006   HGSIL CIN 2/VAIN/ CIN 3/VAIN-3 CIS  . CHF (congestive heart failure) (HCC)   . Foot fracture, left 10/16   hair-line fracture  . Pneumonia    was hospitalized  . RA (rheumatoid arthritis) (HCC) 07/2009    Patient Active Problem List   Diagnosis Date Noted  . Constipation 08/13/2019  . Hemoptysis 08/01/2019  . Medication monitoring encounter 03/20/2019  . Aspergillosis (HCC) 03/14/2019  . Former smoker 03/13/2019  . Cavitary lung disease 02/27/2018  . Aortic atherosclerosis (HCC) 12/29/2017  . Atrial tachycardia (HCC) 12/29/2017  . Atrial septal aneurysm 12/28/2017  . History of pneumonia 02/09/2016  . History of smoking 02/09/2016  . CIN III (cervical intraepithelial neoplasia III) 09/21/2015  . Rheumatoid arthritis involving multiple joints (HCC) 08/09/2014  . Chronic diastolic heart failure  (HCC) 62/69/4854  . DEPRESSION 03/26/2009  . CARPAL TUNNEL SYNDROME 03/26/2009  . ARTHRALGIA 03/26/2009    Past Surgical History:  Procedure Laterality Date  . BRONCHIAL WASHINGS  11/27/2019   Procedure: BRONCHIAL WASHINGS;  Surgeon: Kalman Shan, MD;  Location: [facility];  Service: Cardiopulmonary;;  . CERVICAL BIOPSY  W/ LOOP ELECTRODE EXCISION  2008   CIN 3  . CERVICAL CONIZATION W/BX N/A 12/15/2015   Procedure: CONIZATION CERVIX WITH BIOPSY ;  Surgeon: Jerene Bears, MD;  Location: [facility];  Service: Gynecology;  Laterality: N/A;  POSSIBLE COLD KNIFE CONE.  Please have the microscope in the room  . COLONOSCOPY    . IR RADIOLOGIST EVAL & MGMT  05/27/2020  . LEEP N/A 12/15/2015   Procedure: LOOP ELECTROSURGICAL EXCISION PROCEDURE (LEEP) with colpo;  Surgeon: Jerene Bears, MD;  Location: [facility];  Service: Gynecology;  Laterality: N/A;  . PILONIDAL CYST EXCISION  1999   I&D  . VIDEO BRONCHOSCOPY N/A 11/27/2019   Procedure: VIDEO BRONCHOSCOPY WITHOUT FLUORO;  Surgeon: Kalman Shan, MD;  Location: [facility];  Service: Cardiopulmonary;  Laterality: N/A;  . WISDOM TOOTH EXTRACTION       OB History    Gravida  1   Para  1   Term      Preterm      AB      Living  1     SAB      TAB      Ectopic      Multiple      Live Births  Family History  Problem Relation Age of Onset  . Diabetes Mother   . Rheum arthritis Mother   . Diabetes Father   . COPD Father   . Bronchitis Father   . Cancer Brother        Lung  . Breast cancer Maternal Aunt 57  . CAD Neg Hx     Social History   Tobacco Use  . Smoking status: Former Smoker    Packs/day: 1.00    Years: 30.00    Pack years: 30.00    Types: Cigarettes, E-cigarettes    Start date: 1985    Quit date: 02/16/2014    Years since quitting: 6.3  . Smokeless tobacco: Never Used  Vaping Use  . Vaping Use: Every day  Substance Use Topics  . Alcohol use: No    Alcohol/week: 0.0 standard  drinks  . Drug use: No    Home Medications Prior to Admission medications   Medication Sig Start Date End Date Taking? Authorizing Provider  albuterol (VENTOLIN HFA) 108 (90 Base) MCG/ACT inhaler INHALE 2 PUFFS INTO THE LUNGS EVERY 6 HOURS AS NEEDED FOR WHEEZING OR SHORTNESS OF BREATH Patient taking differently: Inhale 2 puffs into the lungs every 6 (six) hours as needed for wheezing or shortness of breath.  11/07/19   Kalman Shan, MD  benzonatate (TESSALON) 200 MG capsule TAKE 1 CAPSULE(200 MG) BY MOUTH THREE TIMES DAILY AS NEEDED FOR COUGH 12/10/19   Glenford Bayley, NP  EPINEPHrine (EPIPEN 2-PAK) 0.3 mg/0.3 mL IJ SOAJ injection Inject 0.3 mg into the muscle as needed for anaphylaxis.     [provider]  escitalopram (LEXAPRO) 10 MG tablet TAKE 1 TABLET BY MOUTH EVERY DAY 05/13/20   Jerene Bears, MD  HYDROcodone-homatropine [facility]) 5-1.5 MG/5ML syrup Take 5 mLs by mouth every 6 (six) hours as needed for cough. 04/09/20   Glenford Bayley, NP  hydroxychloroquine (PLAQUENIL) 200 MG tablet Take 200 mg by mouth 2 (two) times daily. 10/17/19   [provider]  Nintedanib (OFEV) 150 MG CAPS Take 1 capsule (150 mg total) by mouth 2 (two) times daily. 02/05/20   Kalman Shan, MD  ondansetron (ZOFRAN) 4 MG tablet Take 1 tablet (4 mg total) by mouth 3 (three) times daily as needed for nausea. 01/10/20   Kalman Shan, MD  predniSONE (DELTASONE) 5 MG tablet Take 2.5 mg by mouth daily with breakfast.  07/10/19   [provider]  voriconazole (VFEND) 200 MG tablet TAKE 1 TABLET(200 MG) BY MOUTH TWICE DAILY 01/28/20   Comer, Belia Heman, MD    Allergies    Bee venom, Codeine, Pirfenidone, and Penicillins  Review of Systems   Review of Systems  Gastrointestinal: Positive for abdominal pain.  All other systems reviewed and are negative.   Physical Exam Updated Vital Signs BP (!) 155/64 (BP Location: Right Arm)   Pulse 70   Temp 98.1 F (36.7 C) (Oral)   Resp 15    Ht 5\' 3"  (1.6 m)   Wt 65.3 kg   LMP 09/06/2004   SpO2 99%   BMI 25.51 kg/m   Physical Exam Vitals and nursing note reviewed.  Constitutional:      General: She is not in acute distress.    Appearance: She is well-developed. She is not diaphoretic.  HENT:     Head: Normocephalic and atraumatic.  Cardiovascular:     Rate and Rhythm: Normal rate and regular rhythm.     Heart sounds: No murmur heard.  No friction rub. No gallop.   Pulmonary:     Effort: Pulmonary effort is normal. No respiratory distress.     Breath sounds: Normal breath sounds. No wheezing.  Abdominal:     General: Bowel sounds are normal. There is no distension.     Palpations: Abdomen is soft.     Tenderness: There is abdominal tenderness in the right lower quadrant, suprapubic area and left lower quadrant. There is no right CVA tenderness, left CVA tenderness, guarding or rebound.  Musculoskeletal:        General: Normal range of motion.     Cervical back: Normal range of motion and neck supple.  Skin:    General: Skin is warm and dry.  Neurological:     Mental Status: She is alert and oriented to person, place, and time.     ED Results / Procedures / Treatments   Labs (all labs ordered are listed, but only abnormal results are displayed) Labs Reviewed  COMPREHENSIVE METABOLIC PANEL - Abnormal; Notable for the following components:      Result Value   Chloride 97 (*)    Glucose, Bld 162 (*)    All other components within normal limits  CBC - Abnormal; Notable for the following components:   WBC 14.3 (*)    All other components within normal limits  URINALYSIS, ROUTINE W REFLEX MICROSCOPIC - Abnormal; Notable for the following components:   Specific Gravity, Urine 1.031 (*)    Ketones, ur 5 (*)    Protein, ur 30 (*)    All other components within normal limits  LIPASE, BLOOD    EKG None  Radiology No results found.  Procedures Procedures (including critical care time)  Medications  Ordered in ED Medications  sodium chloride 0.9 % bolus 1,000 mL (has no administration in time range)  ondansetron (ZOFRAN) injection 4 mg (has no administration in time range)  morphine 4 MG/ML injection 4 mg (has no administration in time range)  ondansetron (ZOFRAN-ODT) disintegrating tablet 4 mg (4 mg Oral Given 06/09/20 0113)    ED Course  I have reviewed the triage vital signs and the nursing notes.  Pertinent labs & imaging results that were available during my care of the patient were reviewed by me and considered in my medical decision making (see chart for details).    MDM Rules/Calculators/A&P  Patient presenting with generalized abdominal pain and vomiting that started yesterday evening after eating dinner with friends.  Pain worsened through the night.  Patient's laboratory studies show a white count of 14,000 with normal LFTs and electrolytes.  Her CT scan does show thickening of the gallbladder with pericholecystic fluid and multiple stones within the gallbladder.  Findings were consistent with acute cholecystitis.  This was discussed with Dr. Michaell Cowing from general surgery.  He has entered orders for antibiotics and additional care.  He is recommending admission to the hospitalist service with general surgery consultation.  Final Clinical Impression(s) / ED Diagnoses Final diagnoses:  None    Rx / DC Orders ED Discharge Orders    None       Geoffery Lyons, MD 06/09/20 [phone]

## 2020-06-09 NOTE — Anesthesia Preprocedure Evaluation (Addendum)
Anesthesia Evaluation  Patient identified by MRN, date of birth, ID band Patient awake    Reviewed: Allergy & Precautions, NPO status , Patient's Chart, lab work & pertinent test results  Airway Mallampati: II  TM Distance: >3 FB Neck ROM: Full    Dental no notable dental hx. (+) Teeth Intact, Dental Advisory Given   Pulmonary COPD,  COPD inhaler, former smoker,  30 pack year history, quit smoking 2015    Pulmonary exam normal breath sounds clear to auscultation       Cardiovascular Normal cardiovascular exam Rhythm:Regular Rate:Normal  Echo 09/2019: 1. Left ventricular ejection fraction, by visual estimation, is 60 to 65%. The left ventricle has normal function. There is mildly increased left ventricular hypertrophy.  2. The left ventricle has no regional wall motion abnormalities.  3. Global right ventricle has normal systolic function.The right ventricular size is normal. No increase in right ventricular wall thickness.  4. Left atrial size was normal.  5. Right atrial size was normal.  6. The mitral valve is normal in structure. Trivial mitral valve regurgitation.  7. The tricuspid valve is normal in structure.  8. The aortic valve is tricuspid. Aortic valve regurgitation is not visualized. No evidence of aortic valve sclerosis or stenosis.  9. The pulmonic valve was grossly normal. Pulmonic valve regurgitation is not visualized.  10. The inferior vena cava is normal in size with greater than 50%  respiratory variability, suggesting right atrial pressure of 3 mmHg.  11. Possible left to right shunt across intra-atrial septum.    Neuro/Psych PSYCHIATRIC DISORDERS Depression negative neurological ROS     GI/Hepatic Neg liver ROS, hiatal hernia, Acute cholecystitis    Endo/Other  negative endocrine ROS  Renal/GU negative Renal ROSK+ 4.2 Cr 0.66  negative genitourinary   Musculoskeletal  (+) Arthritis ,  Rheumatoid disorders,  Prednisone, plaquenil    Abdominal   Peds negative pediatric ROS (+)  Hematology negative hematology ROS (+) Hgb 10.8   Anesthesia Other Findings   Reproductive/Obstetrics negative OB ROS                            Anesthesia Physical Anesthesia Plan  ASA: III  Anesthesia Plan: General   Post-op Pain Management:    Induction: Intravenous  PONV Risk Score and Plan: 4 or greater and Ondansetron, Dexamethasone, Midazolam and Treatment may vary due to age or medical condition  Airway Management Planned: Oral ETT  Additional Equipment: None  Intra-op Plan:   Post-operative Plan: Extubation in OR  Informed Consent: I have reviewed the patients History and Physical, chart, labs and discussed the procedure including the risks, benefits and alternatives for the proposed anesthesia with the patient or authorized representative who has indicated his/her understanding and acceptance.     Dental advisory given  Plan Discussed with: CRNA and Anesthesiologist  Anesthesia Plan Comments:        Anesthesia Quick Evaluation

## 2020-06-09 NOTE — Consult Note (Signed)
[facility] Consult Note  Kimberly Shelton 01/18/1961  [id number].    Requesting MD: Dr. Judd Lienelo  Chief Complaint/Reason for Consult: Acute cholecystitis  HPI:  Patient is a 59 year old female who [facility] upper abdominal pain since yesterday. Pain reported as severe and constant since onset. Does not radiate to back or anywhere else. Associated subjective fever, chills, nausea, vomiting. Denies chest pain, SOB, constipation, diarrhea, urinary symptoms. She tried taking a medication for indigestion without relief. Patient reports she has been having more mild symptoms similar to this intermittently for about 6 months and probably vomits at least once weekly. She also reports chronic hemoptysis related to a fungal PNA that occurred in 2015 with scarring of RUL and LLL. She follows with pulmonology for this and is not currently on home O2. PMH otherwise significant for RA on daily prednisone, CHF. Last ECHO earlier this year with EF 60-65%. She is not on any blood thinning medications. No past abdominal surgery.   ROS: Review of Systems  Constitutional: Positive for chills and fever.  Respiratory: Negative for shortness of breath and wheezing.   Cardiovascular: Negative for chest pain and palpitations.  Gastrointestinal: Positive for abdominal pain, nausea and vomiting. Negative for blood in stool, constipation, diarrhea and melena.  Genitourinary: Negative for dysuria, frequency and urgency.  All other systems reviewed and are negative.   Family History  Problem Relation Age of Onset  . Diabetes Mother   . Rheum arthritis Mother   . Diabetes Father   . COPD Father   . Bronchitis Father   . Cancer Brother        Lung  . Breast cancer Maternal Aunt [id number]  . CAD Neg Hx     Past Medical History:  Diagnosis Date  . Abnormal Pap smear 09/2006   HGSIL CIN 2/VAIN/ CIN 3/VAIN-3 CIS  . CHF (congestive heart failure) (HCC)   . Foot fracture, left 10/16   hair-line  fracture  . History of pneumonia 02/09/2016  . Pneumonia    was hospitalized  . RA (rheumatoid arthritis) (HCC) 07/2009    Past Surgical History:  Procedure Laterality Date  . BRONCHIAL WASHINGS  11/27/2019   Procedure: BRONCHIAL WASHINGS;  Surgeon: Kalman Shanamaswamy, Murali, MD;  Location: [facility];  Service: Cardiopulmonary;;  . CERVICAL BIOPSY  W/ LOOP ELECTRODE EXCISION  2008   CIN 3  . CERVICAL CONIZATION W/BX N/A 12/15/2015   Procedure: CONIZATION CERVIX WITH BIOPSY ;  Surgeon: Jerene BearsMary S Miller, MD;  Location: [facility];  Service: Gynecology;  Laterality: N/A;  POSSIBLE COLD KNIFE CONE.  Please have the microscope in the room  . COLONOSCOPY    . IR RADIOLOGIST EVAL & MGMT  05/27/2020  . LEEP N/A 12/15/2015   Procedure: LOOP ELECTROSURGICAL EXCISION PROCEDURE (LEEP) with colpo;  Surgeon: Jerene BearsMary S Miller, MD;  Location: [facility];  Service: Gynecology;  Laterality: N/A;  . PILONIDAL CYST EXCISION  1999   I&D  . VIDEO BRONCHOSCOPY N/A 11/27/2019   Procedure: VIDEO BRONCHOSCOPY WITHOUT FLUORO;  Surgeon: Kalman Shanamaswamy, Murali, MD;  Location: [facility];  Service: Cardiopulmonary;  Laterality: N/A;  . WISDOM TOOTH EXTRACTION      Social History:  reports that she quit smoking about 6 years ago. Her smoking use included cigarettes and e-cigarettes. She started smoking about 36 years ago. She has a 30.00 pack-year smoking history. She has never used smokeless tobacco. She reports that she does not drink alcohol and does not use drugs.   Allergies:  Allergies  °Allergen Reactions  °• Bee Venom Anaphylaxis  °• Codeine Other (See Comments)  °  "seeing spots"  °• Pirfenidone Other (See Comments)  °  GI side effects  °• Penicillins Rash  °  Has patient had a PCN reaction causing immediate rash, facial/tongue/throat swelling, SOB or lightheadedness with hypotension: No °Has patient had a PCN reaction causing severe rash involving mucus membranes or skin necrosis: No °Has patient had a PCN reaction that required  hospitalization No °Has patient had a PCN reaction occurring within the last 10 years: Yes °If all of the above answers are "NO", then may proceed with Cephalosporin use. °  ° ° °Blood pressure 129/60, pulse 78, temperature 98.9 °F (37.2 °C), temperature source Oral, resp. rate 18, height 5' 3" (1.6 m), weight 65.3 kg, last menstrual period 09/06/2004, SpO2 98 %. °Physical Exam:  °General: pleasant, WD, WN female who is laying in bed in NAD °HEENT: Sclera are anicteric.  PERRL.  Ears and nose without any masses or lesions.  Mouth is pink and moist °Heart: regular, rate, and rhythm. Palpable radial and pedal pulses bilaterally °Lungs: CTAB, no wheezes, rhonchi, or rales noted.  Respiratory effort nonlabored °Abd: soft, TTP across upper abdomen and in LLQ, +murphy sign, ND, +BS °MS: all 4 extremities are symmetrical with no cyanosis, clubbing, or edema. °Skin: warm and dry with no masses, lesions, or rashes °Neuro: Cranial nerves 2-12 grossly intact, sensation grossly intact throughout °Psych: A&Ox3 with an appropriate affect.  ° °Results for orders placed or performed during the hospital encounter of 06/09/20 (from the past 48 hour(s))  °Lipase, blood     Status: None  ° Collection Time: 06/09/20  1:16 AM  °Result Value Ref Range  ° Lipase 21 11 - 51 U/L  °  Comment: Performed at [facility], [address] [place], [place] [address]  °Comprehensive metabolic panel     Status: Abnormal  ° Collection Time: 06/09/20  1:16 AM  °Result Value Ref Range  ° Sodium 136 135 - 145 mmol/L  ° Potassium 4.1 3.5 - 5.1 mmol/L  ° Chloride 97 (L) 98 - 111 mmol/L  ° CO2 27 22 - 32 mmol/L  ° Glucose, Bld 162 (H) 70 - 99 mg/dL  °  Comment: Glucose reference range applies only to samples taken after fasting for at least 8 hours.  ° BUN 15 6 - 20 mg/dL  ° Creatinine, Ser 0.71 0.44 - 1.00 mg/dL  ° Calcium 9.3 8.9 - 10.3 mg/dL  ° Total Protein 7.8 6.5 - 8.1 g/dL  ° Albumin 3.8 3.5 - 5.0 g/dL  ° AST 18 15 - 41 U/L  ° ALT  12 0 - 44 U/L  ° Alkaline Phosphatase 126 38 - 126 U/L  ° Total Bilirubin 0.6 0.3 - 1.2 mg/dL  ° GFR calc non Af Amer >60 >60 mL/min  ° GFR calc Af Amer >60 >60 mL/min  ° Anion gap 12 5 - 15  °  Comment: Performed at [facility], [address] [place], [place] [address]  °CBC     Status: Abnormal  ° Collection Time: 06/09/20  1:16 AM  °Result Value Ref Range  ° WBC 14.3 (H) 4.0 - 10.5 K/uL  ° RBC 4.18 3.87 - 5.11 MIL/uL  ° Hemoglobin 12.0 12.0 - 15.0 g/dL  ° HCT 37.1 36 - 46 %  ° MCV 88.8 80.0 - 100.0 fL  ° MCH 28.7 26.0 - 34.0 pg  ° MCHC   32.3 30.0 - 36.0 g/dL  ° RDW 13.6 11.5 - 15.5 %  ° Platelets 302 150 - 400 K/uL  ° nRBC 0.0 0.0 - 0.2 %  °  Comment: Performed at [facility], [address] [place], [place] [address]  °Urinalysis, Routine w reflex microscopic     Status: Abnormal  ° Collection Time: 06/09/20  1:17 AM  °Result Value Ref Range  ° Color, Urine YELLOW YELLOW  ° APPearance CLEAR CLEAR  ° Specific Gravity, Urine 1.031 (H) 1.005 - 1.030  ° pH 6.0 5.0 - 8.0  ° Glucose, UA NEGATIVE NEGATIVE mg/dL  ° Hgb urine dipstick NEGATIVE NEGATIVE  ° Bilirubin Urine NEGATIVE NEGATIVE  ° Ketones, ur 5 (A) NEGATIVE mg/dL  ° Protein, ur 30 (A) NEGATIVE mg/dL  ° Nitrite NEGATIVE NEGATIVE  ° Leukocytes,Ua NEGATIVE NEGATIVE  ° RBC / HPF 0-5 0 - 5 RBC/hpf  ° WBC, UA 0-5 0 - 5 WBC/hpf  ° Bacteria, UA NONE SEEN NONE SEEN  ° Squamous Epithelial / LPF 0-5 0 - 5  ° Mucus PRESENT   °  Comment: Performed at [facility], [address] [place],  [address]  °Respiratory Panel by RT PCR (Flu A&B, Covid) - Nasopharyngeal Swab     Status: None  ° Collection Time: 06/09/20  6:03 AM  ° Specimen: Nasopharyngeal Swab  °Result Value Ref Range  ° SARS Coronavirus 2 by RT PCR NEGATIVE NEGATIVE  °  Comment: (NOTE) °SARS-CoV-2 target nucleic acids are NOT DETECTED. ° °The SARS-CoV-2 RNA is generally detectable in upper respiratoy °specimens during the acute phase of  infection. The lowest °concentration of SARS-CoV-2 viral copies this assay can detect is °131 copies/mL. A negative result does not preclude SARS-Cov-2 °infection and should not be used as the sole basis for treatment or °other patient management decisions. A negative result may occur with  °improper specimen collection/handling, submission of specimen other °than nasopharyngeal swab, presence of viral mutation(s) within the °areas targeted by this assay, and inadequate number of viral copies °(<131 copies/mL). A negative result must be combined with clinical °observations, patient history, and epidemiological information. The °expected result is Negative. ° °Fact Sheet for Patients:  °https://www.fda.gov/media/142436/download ° °Fact Sheet for Healthcare Providers:  °https://www.fda.gov/media/142435/download ° °This test is no t yet approved or cleared by the [place] FDA and  °has been authorized for detection and/or diagnosis of SARS-CoV-2 by °FDA under an Emergency Use Authorization (EUA). This EUA will remain  °in effect (meaning this test can be used) for the duration of the °COVID-19 declaration under Section 564(b)(1) of the Act, 21 U.S.C. °section 360bbb-3(b)(1), unless the authorization is terminated or °revoked sooner. ° °  ° Influenza A by PCR NEGATIVE NEGATIVE  ° Influenza B by PCR NEGATIVE NEGATIVE  °  Comment: (NOTE) °The Xpert Xpress SARS-CoV-2/FLU/RSV assay is intended as an aid in  °the diagnosis of influenza from Nasopharyngeal swab specimens and  °should not be used as a sole basis for treatment. Nasal washings and  °aspirates are unacceptable for Xpert Xpress SARS-CoV-2/FLU/RSV  °testing. ° °Fact Sheet for Patients: °https://www.fda.gov/media/142436/download ° °Fact Sheet for Healthcare Providers: °https://www.fda.gov/media/142435/download ° °This test is not yet approved or cleared by the [place] FDA and  °has been authorized for detection and/or diagnosis of SARS-CoV-2 by  °FDA  under an Emergency Use Authorization (EUA). This EUA will remain  °in effect (meaning this test can be used) for the duration of the  °Covid-19 declaration under Section 564(b)(1)   of the Act, 21  °U.S.C. section 360bbb-3(b)(1), unless the authorization is  °terminated or revoked. °Performed at [facility], [address] °[place], [place] [address] °  °Magnesium     Status: None  ° Collection Time: 06/09/20  6:35 AM  °Result Value Ref Range  ° Magnesium 1.9 1.7 - 2.4 mg/dL  °  Comment: Performed at [facility], [address] [place], [place] [address]  °Phosphorus     Status: None  ° Collection Time: 06/09/20  6:35 AM  °Result Value Ref Range  ° Phosphorus 2.5 2.5 - 4.6 mg/dL  °  Comment: Performed at [facility], [address] [place], [place] [address]  ° °CT ABDOMEN PELVIS W CONTRAST ° °Result Date: 06/09/2020 °CLINICAL DATA:  Vomiting and abdominal pain. EXAM: CT ABDOMEN AND PELVIS WITH CONTRAST TECHNIQUE: Multidetector CT imaging of the abdomen and pelvis was performed using the standard protocol following bolus administration of intravenous contrast. CONTRAST:  100mL OMNIPAQUE IOHEXOL 300 MG/ML  SOLN COMPARISON:  CT chest 09/26/2019 FINDINGS: Lower chest: 10 mm nodule at the left base (image 19/series 4) is stable. Chronic peripheral interstitial lung disease evident, better characterized on previous chest CT as consistent with UIP. Hepatobiliary: Mild intrahepatic biliary duct prominence. Small area of low attenuation in the anterior liver, adjacent to the falciform ligament, is in a characteristic location for focal fatty deposition. Heterogeneous perfusion in the liver along the gallbladder fossa with some probable periportal edema inferiorly towards the gallbladder fundus. Gallbladder wall thickening is associated with multiple gallstones measuring up to about 10 mm diameter. There is pericholecystic edema/inflammation with fluid in the  hepatoduodenal ligament/porta hepatis. Common bile duct measures 8 mm in the head of pancreas. Pancreas: Main pancreatic duct is distended up to 6 mm in the head of pancreas. No obstructing pancreatic head mass evident. Spleen: No splenomegaly. No focal mass lesion. Adrenals/Urinary Tract: No adrenal nodule or mass. Kidneys unremarkable. No evidence for hydroureter. The urinary bladder appears normal for the degree of distention. Stomach/Bowel: Tiny hiatal hernia. Stomach otherwise unremarkable. Duodenum is normally positioned as is the ligament of Treitz. No small bowel wall thickening. No small bowel dilatation. The terminal ileum is normal. The appendix is normal. No gross colonic mass. No colonic wall thickening. Vascular/Lymphatic: There is abdominal aortic atherosclerosis without aneurysm. There is no gastrohepatic or hepatoduodenal ligament lymphadenopathy. No retroperitoneal or mesenteric lymphadenopathy. No pelvic sidewall lymphadenopathy. Reproductive: The uterus is unremarkable.  There is no adnexal mass. Other: No gross intraperitoneal free fluid. Musculoskeletal: No worrisome lytic or sclerotic osseous abnormality. IMPRESSION: 1. Cholelithiasis with gallbladder wall thickening, pericholecystic edema, and fluid in the hepatoduodenal ligament/porta hepatis. Imaging features are compatible with acute cholecystitis. 2. Mild intrahepatic biliary duct prominence with dilatation of the main pancreatic duct up to 6 mm in the head of pancreas. No choledocholithiasis apparent by CT although stones are not densely calcified. No obstructing pancreatic head mass evident. ERCP or MRCP may prove helpful to further evaluate. 3. Stable 10 mm left lower lobe pulmonary nodule with chronic peripheral interstitial lung disease, better characterized on previous chest CT as consistent with UIP. Please see that report. 4. Tiny hiatal hernia. 5. Aortic Atherosclerosis (ICD10-I70.0). Electronically Signed   By: Eric  Mansell  M.D.   On: 06/09/2020 05:47  ° ° ° ° °Assessment/Plan °RA on daily prednisone and plaquenil °Hx of fungal PNA followed by pulmonology, on chronic voriconazole  °CHF - EF 60-65% ° °Acute cholecystitis  °- CT today   showed cholelithiasis with gallbladder wall thickening and pericholecystic fluid, mild intrahepatic ductal dilatation  °- WBC 14, afebrile °- LFTs WNL, Tbili WNL °- pulmonary clearance, keep on IV abx, ok to have CLD today, will plan for lap chole tomorrow  ° °FEN: CLD, IVF - NPO after MN for OR tomorrow  °VTE: lovenox °ID: cefepime/flagyl 10/4>> ° °Admit to [facility] and general surgery will follow.  ° °Jasie Meleski R Ennis Delpozo, PA-C °[facility] °06/09/2020, 8:20 AM °Please see Amion for pager number during day hours 7:00am-4:30pm ° ° °

## 2020-06-09 NOTE — H&P (Addendum)
History and Physical        Hospital Admission Note Date: 06/09/2020  Patient name: Kimberly Shelton Medical record number: [id number] Date of birth: Dec 20, 1960 Age: 59 y.o. Gender: female  PCP: Kristian Covey, MD  Patient coming from: Home  At baseline, ambulates: Independently  Chief Complaint    Chief Complaint  Patient presents with  . Abdominal Pain      HPI:   This is a 59 year old female with past medical history of rheumatoid arthritis on immunomodulators, tobacco use, pneumococcal pneumonia in June 2015 resulting cavitation and unfortunately chronic aspergillosis colonization with aspergilloma and hemoptysis (on voriconazole), HFpEF (last EF 60 to 65%, 09/20/2019) who presented to the ED with lower abdominal pain and vomiting that started yesterday evening around 5 PM after eating at a new restaurant for lunch and persisted and worsened throughout the night.  She reports that she has been having indigestion and occasional GI upset over the past 6 months but related this to her medications. Denied fevers but admitted to occasional diaphoresis and chills throughout the night.  Denies hematemesis, bloody stools, diarrhea.  Has had light-colored stools.  Denies any history of abdominal surgeries.  ED Course: Afebrile and hemodynamically stable on room air.  Notable labs: WBC 14.3, otherwise overall unremarkable.  CT abdomen pelvis with contrast most notable for: Cholelithiasis and acute cholecystitis, mild intrahepatic biliary ductal prominence with dilatation of the main pancreatic duct to 6 mm at the head of the pancreas with no choledocholithiasis apparent by CT although the stones are not densely calcified.  ED physician discussed with Dr. Michaell Cowing from general surgery who recommended antibiotics and hospitalist admission.  She was given morphine, Zofran, ceftriaxone, 1 L NS  bolus.  Vitals:   06/09/20 0700 06/09/20 0800  BP: (!) 130/57 129/60  Pulse: 82 78  Resp: 20 18  Temp:  98.9 F (37.2 C)  SpO2: 97% 98%     Review of Systems:  Review of Systems  Constitutional: Positive for chills and diaphoresis. Negative for fever.  Respiratory: Negative for shortness of breath and wheezing.        Has chronic hemoptysis  Cardiovascular: Negative for chest pain, palpitations and leg swelling.  Gastrointestinal: Positive for abdominal pain, nausea and vomiting. Negative for blood in stool and diarrhea.  Genitourinary: Negative.  Negative for dysuria.  Musculoskeletal: Negative for myalgias.  Neurological: Negative for weakness.  All other systems reviewed and are negative.   Medical/Social/Family History   Past Medical History: Past Medical History:  Diagnosis Date  . Abnormal Pap smear 09/2006   HGSIL CIN 2/VAIN/ CIN 3/VAIN-3 CIS  . CHF (congestive heart failure) (HCC)   . Foot fracture, left 10/16   hair-line fracture  . History of pneumonia 02/09/2016  . Pneumonia    was hospitalized  . RA (rheumatoid arthritis) (HCC) 07/2009    Past Surgical History:  Procedure Laterality Date  . BRONCHIAL WASHINGS  11/27/2019   Procedure: BRONCHIAL WASHINGS;  Surgeon: Kalman Shan, MD;  Location: [facility];  Service: Cardiopulmonary;;  . CERVICAL BIOPSY  W/ LOOP ELECTRODE EXCISION  2008   CIN 3  . CERVICAL CONIZATION W/BX N/A 12/15/2015   Procedure: CONIZATION CERVIX WITH BIOPSY ;  Surgeon:  Jerene Bears, MD;  Location: [facility];  Service: Gynecology;  Laterality: N/A;  POSSIBLE COLD KNIFE CONE.  Please have the microscope in the room  . COLONOSCOPY    . IR RADIOLOGIST EVAL & MGMT  05/27/2020  . LEEP N/A 12/15/2015   Procedure: LOOP ELECTROSURGICAL EXCISION PROCEDURE (LEEP) with colpo;  Surgeon: Jerene Bears, MD;  Location: [facility];  Service: Gynecology;  Laterality: N/A;  . PILONIDAL CYST EXCISION  1999   I&D  . VIDEO BRONCHOSCOPY N/A 11/27/2019    Procedure: VIDEO BRONCHOSCOPY WITHOUT FLUORO;  Surgeon: Kalman Shan, MD;  Location: [facility];  Service: Cardiopulmonary;  Laterality: N/A;  . WISDOM TOOTH EXTRACTION      Medications: Prior to Admission medications   Medication Sig Start Date End Date Taking? Authorizing Provider  albuterol (VENTOLIN HFA) 108 (90 Base) MCG/ACT inhaler INHALE 2 PUFFS INTO THE LUNGS EVERY 6 HOURS AS NEEDED FOR WHEEZING OR SHORTNESS OF BREATH Patient taking differently: Inhale 2 puffs into the lungs every 6 (six) hours as needed for wheezing or shortness of breath.  11/07/19   Kalman Shan, MD  benzonatate (TESSALON) 200 MG capsule TAKE 1 CAPSULE(200 MG) BY MOUTH THREE TIMES DAILY AS NEEDED FOR COUGH 12/10/19   Glenford Bayley, NP  EPINEPHrine (EPIPEN 2-PAK) 0.3 mg/0.3 mL IJ SOAJ injection Inject 0.3 mg into the muscle as needed for anaphylaxis.     [provider]  escitalopram (LEXAPRO) 10 MG tablet TAKE 1 TABLET BY MOUTH EVERY DAY 05/13/20   Jerene Bears, MD  HYDROcodone-homatropine [facility]) 5-1.5 MG/5ML syrup Take 5 mLs by mouth every 6 (six) hours as needed for cough. 04/09/20   Glenford Bayley, NP  hydroxychloroquine (PLAQUENIL) 200 MG tablet Take 200 mg by mouth 2 (two) times daily. 10/17/19   [provider]  Nintedanib (OFEV) 150 MG CAPS Take 1 capsule (150 mg total) by mouth 2 (two) times daily. 02/05/20   Kalman Shan, MD  ondansetron (ZOFRAN) 4 MG tablet Take 1 tablet (4 mg total) by mouth 3 (three) times daily as needed for nausea. 01/10/20   Kalman Shan, MD  predniSONE (DELTASONE) 5 MG tablet Take 2.5 mg by mouth daily with breakfast.  07/10/19   [provider]  voriconazole (VFEND) 200 MG tablet TAKE 1 TABLET(200 MG) BY MOUTH TWICE DAILY 01/28/20   Comer, Belia Heman, MD    Allergies:   Allergies  Allergen Reactions  . Bee Venom Anaphylaxis  . Codeine Other (See Comments)    "seeing spots"  . Pirfenidone Other (See Comments)    GI side effects  .  Penicillins Rash    Has patient had a PCN reaction causing immediate rash, facial/tongue/throat swelling, SOB or lightheadedness with hypotension: No Has patient had a PCN reaction causing severe rash involving mucus membranes or skin necrosis: No Has patient had a PCN reaction that required hospitalization No Has patient had a PCN reaction occurring within the last 10 years: Yes If all of the above answers are "NO", then may proceed with Cephalosporin use.     Social History:  reports that she quit smoking about 6 years ago. Her smoking use included cigarettes and e-cigarettes. She started smoking about 36 years ago. She has a 30.00 pack-year smoking history. She has never used smokeless tobacco. She reports that she does not drink alcohol and does not use drugs.  Family History: Family History  Problem Relation Age of Onset  . Diabetes Mother   . Rheum arthritis Mother   .  Diabetes Father   . COPD Father   . Bronchitis Father   . Cancer Brother        Lung  . Breast cancer Maternal Aunt 63  . CAD Neg Hx      Objective   Physical Exam: Blood pressure 129/60, pulse 78, temperature 98.9 F (37.2 C), temperature source Oral, resp. rate 18, height 5\' 3"  (1.6 m), weight 65.3 kg, last menstrual period 09/06/2004, SpO2 98 %.  Physical Exam Vitals and nursing note reviewed.  Constitutional:      Appearance: Normal appearance.  HENT:     Head: Normocephalic and atraumatic.  Eyes:     Conjunctiva/sclera: Conjunctivae normal.  Cardiovascular:     Rate and Rhythm: Normal rate and regular rhythm.  Pulmonary:     Effort: Pulmonary effort is normal.     Breath sounds: Normal breath sounds.  Abdominal:     General: Abdomen is flat.     Palpations: Abdomen is soft.     Tenderness: There is generalized abdominal tenderness. Positive signs include Murphy's sign.  Musculoskeletal:        General: No swelling or tenderness.  Skin:    Coloration: Skin is not jaundiced or pale.   Neurological:     Mental Status: She is alert. Mental status is at baseline.  Psychiatric:        Mood and Affect: Mood normal.        Behavior: Behavior normal.     LABS on Admission: I have personally reviewed all the labs and imaging below    Basic Metabolic Panel: Recent Labs  Lab 06/09/20 0116 06/09/20 0635  NA 136  --   K 4.1  --   CL 97*  --   CO2 27  --   GLUCOSE 162*  --   BUN 15  --   CREATININE 0.71  --   CALCIUM 9.3  --   MG  --  1.9  PHOS  --  2.5   Liver Function Tests: Recent Labs  Lab 06/09/20 0116  AST 18  ALT 12  ALKPHOS 126  BILITOT 0.6  PROT 7.8  ALBUMIN 3.8   Recent Labs  Lab 06/09/20 0116  LIPASE 21   No results for input(s): AMMONIA in the last 168 hours. CBC: Recent Labs  Lab 06/09/20 0116  WBC 14.3*  HGB 12.0  HCT 37.1  MCV 88.8  PLT 302   Cardiac Enzymes: No results for input(s): CKTOTAL, CKMB, CKMBINDEX, TROPONINI in the last 168 hours. BNP: Invalid input(s): POCBNP CBG: No results for input(s): GLUCAP in the last 168 hours.  Radiological Exams on Admission:  CT ABDOMEN PELVIS W CONTRAST  Result Date: 06/09/2020 CLINICAL DATA:  Vomiting and abdominal pain. EXAM: CT ABDOMEN AND PELVIS WITH CONTRAST TECHNIQUE: Multidetector CT imaging of the abdomen and pelvis was performed using the standard protocol following bolus administration of intravenous contrast. CONTRAST:  08/09/2020 OMNIPAQUE IOHEXOL 300 MG/ML  SOLN COMPARISON:  CT chest 09/26/2019 FINDINGS: Lower chest: 10 mm nodule at the left base (image 19/series 4) is stable. Chronic peripheral interstitial lung disease evident, better characterized on previous chest CT as consistent with UIP. Hepatobiliary: Mild intrahepatic biliary duct prominence. Small area of low attenuation in the anterior liver, adjacent to the falciform ligament, is in a characteristic location for focal fatty deposition. Heterogeneous perfusion in the liver along the gallbladder fossa with some probable  periportal edema inferiorly towards the gallbladder fundus. Gallbladder wall thickening is associated with multiple gallstones measuring up  to about 10 mm diameter. There is pericholecystic edema/inflammation with fluid in the hepatoduodenal ligament/porta hepatis. Common bile duct measures 8 mm in the head of pancreas. Pancreas: Main pancreatic duct is distended up to 6 mm in the head of pancreas. No obstructing pancreatic head mass evident. Spleen: No splenomegaly. No focal mass lesion. Adrenals/Urinary Tract: No adrenal nodule or mass. Kidneys unremarkable. No evidence for hydroureter. The urinary bladder appears normal for the degree of distention. Stomach/Bowel: Tiny hiatal hernia. Stomach otherwise unremarkable. Duodenum is normally positioned as is the ligament of Treitz. No small bowel wall thickening. No small bowel dilatation. The terminal ileum is normal. The appendix is normal. No gross colonic mass. No colonic wall thickening. Vascular/Lymphatic: There is abdominal aortic atherosclerosis without aneurysm. There is no gastrohepatic or hepatoduodenal ligament lymphadenopathy. No retroperitoneal or mesenteric lymphadenopathy. No pelvic sidewall lymphadenopathy. Reproductive: The uterus is unremarkable.  There is no adnexal mass. Other: No gross intraperitoneal free fluid. Musculoskeletal: No worrisome lytic or sclerotic osseous abnormality. IMPRESSION: 1. Cholelithiasis with gallbladder wall thickening, pericholecystic edema, and fluid in the hepatoduodenal ligament/porta hepatis. Imaging features are compatible with acute cholecystitis. 2. Mild intrahepatic biliary duct prominence with dilatation of the main pancreatic duct up to 6 mm in the head of pancreas. No choledocholithiasis apparent by CT although stones are not densely calcified. No obstructing pancreatic head mass evident. ERCP or MRCP may prove helpful to further evaluate. 3. Stable 10 mm left lower lobe pulmonary nodule with chronic peripheral  interstitial lung disease, better characterized on previous chest CT as consistent with UIP. Please see that report. 4. Tiny hiatal hernia. 5. Aortic Atherosclerosis (ICD10-I70.0). Electronically Signed   By: Kennith Center M.D.   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 Acute cholecystitis with cholelithiasis a. Afebrile and hemodynamically stable on room air with leukocytosis b. CT with mild intrahepatic biliary ductal prominence with dilatation of the main pancreatic duct up to 6 mm in pancreatic head however no choledocholithiasis apparent by CT although stones or not densely calcified-will order an MRCP at the recommendation of the radiologist c. Pulmonology consulted for surgical clearance given her underlying aspergillosis and chronic hemoptysis d. Continue cefepime and metronidazole e. IV fluids and encourage p.o. intake with clear liquids if tolerable for today, n.p.o. after midnight f. Discussed plan with Trixie Deis, general surgery PA   2. Chronic hemoptysis from cavitary aspergillosis, stable a. On voriconazole as an outpatient, not on Ofev b. Follows with Dr. [name] c. Pulmonary consulted as above  3. Rheumatoid arthritis, not in flare a. Continue Plaquenil and low-dose daily prednisone  4. Compensated HFpEF a. Daily weights and I/O   DVT prophylaxis:  Lovenox   Code Status: Prior  Diet: Clear liquid diet, n.p.o. after midnight Family Communication: Admission, patients condition and plan of care including tests being ordered have been  discussed with the patient who indicates understanding and agrees with the plan and Code Status.  Disposition Plan: The appropriate patient status for this patient is INPATIENT. Inpatient status is judged to be reasonable and necessary in order to provide the required intensity of service to ensure the patient's safety. The patient's presenting symptoms, physical exam findings, and initial radiographic and laboratory data in the context of their chronic comorbidities is felt to place them at high risk for further clinical deterioration. Furthermore, it is not anticipated that the patient will be medically stable for discharge from the hospital within 2 midnights of admission. The following factors support the patient status of inpatient.   " The patient's presenting symptoms include nausea, vomiting, abdominal pain. " The worrisome physical exam findings include generalized abdominal pain with positive Murphy sign and left-sided abdominal pain. " The initial radiographic and laboratory data are worrisome because of leukocytosis, acute cholecystitis. " The chronic co-morbidities include rheumatoid arthritis, aspergillosis.   * I certify that at the point of admission it is my clinical judgment that the patient will require inpatient hospital care spanning beyond 2 midnights from the point of admission due to high intensity of service, high risk for further deterioration and high frequency of surveillance required.*      The medical decision making on this patient was of high complexity and the patient is at high risk for clinical deterioration, therefore this is a level 3 admission.  Consultants  . General surgery . PCCM  Procedures  . None  Time Spent on Admission: 70 minutes    Jae Dire, DO [facility]  06/09/2020, 8:27 AM

## 2020-06-10 ENCOUNTER — Encounter (HOSPITAL_COMMUNITY): Payer: Self-pay | Admitting: Internal Medicine

## 2020-06-10 ENCOUNTER — Inpatient Hospital Stay (HOSPITAL_COMMUNITY): Payer: 59 | Admitting: Anesthesiology

## 2020-06-10 ENCOUNTER — Encounter (HOSPITAL_COMMUNITY)
Admission: EM | Disposition: A | Discharge status: Home / Self Care | Payer: Self-pay | Source: Home / Self Care | Attending: Internal Medicine

## 2020-06-10 DIAGNOSIS — D72825 Bandemia: Secondary | ICD-10-CM

## 2020-06-10 DIAGNOSIS — I5032 Chronic diastolic (congestive) heart failure: Secondary | ICD-10-CM

## 2020-06-10 DIAGNOSIS — J8489 Other specified interstitial pulmonary diseases: Secondary | ICD-10-CM

## 2020-06-10 DIAGNOSIS — R042 Hemoptysis: Secondary | ICD-10-CM

## 2020-06-10 DIAGNOSIS — M359 Systemic involvement of connective tissue, unspecified: Secondary | ICD-10-CM

## 2020-06-10 DIAGNOSIS — D649 Anemia, unspecified: Secondary | ICD-10-CM

## 2020-06-10 HISTORY — PX: CHOLECYSTECTOMY: SHX55

## 2020-06-10 LAB — CBC
HCT: 34.4 % — ABNORMAL LOW (ref 36.0–46.0)
Hemoglobin: 10.8 g/dL — ABNORMAL LOW (ref 12.0–15.0)
MCH: 28.9 pg (ref 26.0–34.0)
MCHC: 31.4 g/dL (ref 30.0–36.0)
MCV: 92 fL (ref 80.0–100.0)
Platelets: 261 10*3/uL (ref 150–400)
RBC: 3.74 MIL/uL — ABNORMAL LOW (ref 3.87–5.11)
RDW: 14.5 % (ref 11.5–15.5)
WBC: 23.4 10*3/uL — ABNORMAL HIGH (ref 4.0–10.5)
nRBC: 0 % (ref 0.0–0.2)

## 2020-06-10 LAB — BASIC METABOLIC PANEL
Anion gap: 10 (ref 5–15)
BUN: 16 mg/dL (ref 6–20)
CO2: 22 mmol/L (ref 22–32)
Calcium: 8.2 mg/dL — ABNORMAL LOW (ref 8.9–10.3)
Chloride: 105 mmol/L (ref 98–111)
Creatinine, Ser: 0.66 mg/dL (ref 0.44–1.00)
GFR calc Af Amer: >60 mL/min (ref >60)
GFR calc non Af Amer: >60 mL/min (ref >60)
Glucose, Bld: 98 mg/dL (ref 70–99)
Potassium: 4.2 mmol/L (ref 3.5–5.1)
Sodium: 137 mmol/L (ref 135–145)

## 2020-06-10 SURGERY — LAPAROSCOPIC CHOLECYSTECTOMY WITH INTRAOPERATIVE CHOLANGIOGRAM
Anesthesia: General

## 2020-06-10 MED ORDER — KETOROLAC TROMETHAMINE 15 MG/ML IJ SOLN
INTRAMUSCULAR | Status: AC
  Filled 2020-06-10: qty 1

## 2020-06-10 MED ORDER — BUPIVACAINE-EPINEPHRINE (PF) 0.25% -1:200000 IJ SOLN
INTRAMUSCULAR | Status: AC
  Filled 2020-06-10: qty 30

## 2020-06-10 MED ORDER — DEXAMETHASONE SODIUM PHOSPHATE 10 MG/ML IJ SOLN
INTRAMUSCULAR | Status: DC | PRN
  Administered 2020-06-10: 4 mg via INTRAVENOUS

## 2020-06-10 MED ORDER — ACETAMINOPHEN 500 MG PO TABS: 1000.0000 mg | ORAL_TABLET | Freq: Once | ORAL | Status: DC

## 2020-06-10 MED ORDER — HYDROMORPHONE HCL 1 MG/ML IJ SOLN
INTRAMUSCULAR | Status: AC
  Filled 2020-06-10: qty 1

## 2020-06-10 MED ORDER — LACTATED RINGERS IV SOLN: INTRAVENOUS | Status: DC | PRN

## 2020-06-10 MED ORDER — PHENYLEPHRINE 40 MCG/ML (10ML) SYRINGE FOR IV PUSH (FOR BLOOD PRESSURE SUPPORT)
PREFILLED_SYRINGE | INTRAVENOUS | Status: DC | PRN
  Administered 2020-06-10 (×3): 120 ug via INTRAVENOUS

## 2020-06-10 MED ORDER — LACTATED RINGERS IR SOLN
Status: DC | PRN
  Administered 2020-06-10: 1000 mL

## 2020-06-10 MED ORDER — KETOROLAC TROMETHAMINE 30 MG/ML IJ SOLN: 30.0000 mg | Freq: Once | INTRAMUSCULAR | Status: DC | PRN

## 2020-06-10 MED ORDER — DEXAMETHASONE SODIUM PHOSPHATE 10 MG/ML IJ SOLN
INTRAMUSCULAR | Status: AC
  Filled 2020-06-10: qty 1

## 2020-06-10 MED ORDER — ONDANSETRON HCL 4 MG/2ML IJ SOLN
INTRAMUSCULAR | Status: DC | PRN
  Administered 2020-06-10: 4 mg via INTRAVENOUS

## 2020-06-10 MED ORDER — KETOROLAC TROMETHAMINE 15 MG/ML IJ SOLN
15.0000 mg | INTRAMUSCULAR | Status: AC
  Administered 2020-06-10: 15 mg via INTRAVENOUS

## 2020-06-10 MED ORDER — DEXMEDETOMIDINE (PRECEDEX) IN NS 20 MCG/5ML (4 MCG/ML) IV SYRINGE
PREFILLED_SYRINGE | INTRAVENOUS | Status: DC | PRN
  Administered 2020-06-10 (×2): 4 ug via INTRAVENOUS
  Administered 2020-06-10: 8 ug via INTRAVENOUS

## 2020-06-10 MED ORDER — FENTANYL CITRATE (PF) 100 MCG/2ML IJ SOLN
INTRAMUSCULAR | Status: DC | PRN
  Administered 2020-06-10 (×2): 25 ug via INTRAVENOUS
  Administered 2020-06-10 (×2): 50 ug via INTRAVENOUS

## 2020-06-10 MED ORDER — SCOPOLAMINE 1 MG/3DAYS TD PT72
1.0000 | MEDICATED_PATCH | TRANSDERMAL | Status: DC
  Administered 2020-06-10: 1.5 mg via TRANSDERMAL
  Filled 2020-06-10 (×2): qty 1

## 2020-06-10 MED ORDER — PROPOFOL 10 MG/ML IV BOLUS
INTRAVENOUS | Status: AC
  Filled 2020-06-10: qty 20

## 2020-06-10 MED ORDER — PROPOFOL 10 MG/ML IV BOLUS
INTRAVENOUS | Status: DC | PRN
  Administered 2020-06-10: 150 mg via INTRAVENOUS

## 2020-06-10 MED ORDER — HYDROMORPHONE HCL 1 MG/ML IJ SOLN
0.2500 mg | INTRAMUSCULAR | Status: DC | PRN
  Administered 2020-06-10 (×2): 0.5 mg via INTRAVENOUS

## 2020-06-10 MED ORDER — SUGAMMADEX SODIUM 200 MG/2ML IV SOLN
INTRAVENOUS | Status: DC | PRN
  Administered 2020-06-10: 140 mg via INTRAVENOUS

## 2020-06-10 MED ORDER — SUCCINYLCHOLINE CHLORIDE 200 MG/10ML IV SOSY
PREFILLED_SYRINGE | INTRAVENOUS | Status: DC | PRN
  Administered 2020-06-10: 80 mg via INTRAVENOUS

## 2020-06-10 MED ORDER — GABAPENTIN 300 MG PO CAPS
300.0000 mg | ORAL_CAPSULE | ORAL | Status: AC
  Administered 2020-06-10: 300 mg via ORAL
  Filled 2020-06-10: qty 1

## 2020-06-10 MED ORDER — SUCCINYLCHOLINE CHLORIDE 200 MG/10ML IV SOSY
PREFILLED_SYRINGE | INTRAVENOUS | Status: AC
  Filled 2020-06-10: qty 10

## 2020-06-10 MED ORDER — FENTANYL CITRATE (PF) 100 MCG/2ML IJ SOLN
INTRAMUSCULAR | Status: AC
  Filled 2020-06-10: qty 2

## 2020-06-10 MED ORDER — PHENYLEPHRINE 40 MCG/ML (10ML) SYRINGE FOR IV PUSH (FOR BLOOD PRESSURE SUPPORT)
PREFILLED_SYRINGE | INTRAVENOUS | Status: AC
  Filled 2020-06-10: qty 10

## 2020-06-10 MED ORDER — MIDAZOLAM HCL 2 MG/2ML IJ SOLN
INTRAMUSCULAR | Status: AC
  Filled 2020-06-10: qty 2

## 2020-06-10 MED ORDER — PROMETHAZINE HCL 25 MG/ML IJ SOLN: 6.2500 mg | INTRAMUSCULAR | Status: DC | PRN

## 2020-06-10 MED ORDER — BUPIVACAINE-EPINEPHRINE 0.25% -1:200000 IJ SOLN
INTRAMUSCULAR | Status: DC | PRN
  Administered 2020-06-10: 24 mL

## 2020-06-10 MED ORDER — LIDOCAINE 2% (20 MG/ML) 5 ML SYRINGE
INTRAMUSCULAR | Status: AC
  Filled 2020-06-10: qty 5

## 2020-06-10 MED ORDER — HYDROCORTISONE NA SUCCINATE PF 100 MG IJ SOLR
50.0000 mg | Freq: Four times a day (QID) | INTRAMUSCULAR | Status: DC
  Administered 2020-06-10 – 2020-06-11 (×5): 50 mg via INTRAVENOUS
  Filled 2020-06-10 (×6): qty 1

## 2020-06-10 MED ORDER — ROCURONIUM BROMIDE 10 MG/ML (PF) SYRINGE
PREFILLED_SYRINGE | INTRAVENOUS | Status: AC
  Filled 2020-06-10: qty 10

## 2020-06-10 MED ORDER — ROCURONIUM BROMIDE 10 MG/ML (PF) SYRINGE
PREFILLED_SYRINGE | INTRAVENOUS | Status: DC | PRN
  Administered 2020-06-10: 40 mg via INTRAVENOUS

## 2020-06-10 MED ORDER — LIDOCAINE 2% (20 MG/ML) 5 ML SYRINGE
INTRAMUSCULAR | Status: DC | PRN
  Administered 2020-06-10: 80 mg via INTRAVENOUS

## 2020-06-10 MED ORDER — ONDANSETRON HCL 4 MG/2ML IJ SOLN
INTRAMUSCULAR | Status: AC
  Filled 2020-06-10: qty 2

## 2020-06-10 MED ORDER — MIDAZOLAM HCL 2 MG/2ML IJ SOLN
INTRAMUSCULAR | Status: DC | PRN
  Administered 2020-06-10: 2 mg via INTRAVENOUS

## 2020-06-10 MED ORDER — 0.9 % SODIUM CHLORIDE (POUR BTL) OPTIME
TOPICAL | Status: DC | PRN
  Administered 2020-06-10: 1000 mL

## 2020-06-10 MED ORDER — OXYCODONE HCL 5 MG/5ML PO SOLN: 5.0000 mg | Freq: Once | ORAL | Status: DC | PRN

## 2020-06-10 MED ORDER — ACETAMINOPHEN 500 MG PO TABS
1000.0000 mg | ORAL_TABLET | ORAL | Status: DC
  Filled 2020-06-10: qty 2

## 2020-06-10 MED ORDER — OXYCODONE HCL 5 MG PO TABS: 5.0000 mg | ORAL_TABLET | Freq: Once | ORAL | Status: DC | PRN

## 2020-06-10 SURGICAL SUPPLY — 33 items
APPLIER CLIP 5 13 M/L LIGAMAX5 (MISCELLANEOUS) ×2
CABLE HIGH FREQUENCY MONO STRZ (ELECTRODE) ×2 IMPLANT
CATH REDDICK CHOLANGI 4FR 50CM (CATHETERS) ×2 IMPLANT
CHLORAPREP W/TINT 26 (MISCELLANEOUS) ×2 IMPLANT
CLIP APPLIE 5 13 M/L LIGAMAX5 (MISCELLANEOUS) ×1 IMPLANT
COVER MAYO STAND STRL (DRAPES) ×2 IMPLANT
COVER WAND RF STERILE (DRAPES) IMPLANT
DECANTER SPIKE VIAL GLASS SM (MISCELLANEOUS) ×2 IMPLANT
DERMABOND ADVANCED (GAUZE/BANDAGES/DRESSINGS) ×1
DERMABOND ADVANCED .7 DNX12 (GAUZE/BANDAGES/DRESSINGS) ×1 IMPLANT
DRAIN CHANNEL 19F RND (DRAIN) ×1 IMPLANT
DRAIN JP 10F RND SILICONE (MISCELLANEOUS) ×2 IMPLANT
DRAPE C-ARM 42X120 X-RAY (DRAPES) ×2 IMPLANT
ELECT REM PT RETURN 15FT ADLT (MISCELLANEOUS) ×2 IMPLANT
GLOVE BIO SURGEON STRL SZ7.5 (GLOVE) ×2 IMPLANT
GOWN STRL REUS W/TWL XL LVL3 (GOWN DISPOSABLE) ×6 IMPLANT
HEMOSTAT SURGICEL 4X8 (HEMOSTASIS) IMPLANT
IV CATH 14GX2 1/4 (CATHETERS) ×2 IMPLANT
KIT BASIN OR (CUSTOM PROCEDURE TRAY) ×2 IMPLANT
KIT TURNOVER KIT A (KITS) IMPLANT
PENCIL SMOKE EVACUATOR (MISCELLANEOUS) IMPLANT
POUCH SPECIMEN RETRIEVAL 10MM (ENDOMECHANICALS) ×2 IMPLANT
SCISSORS LAP 5X35 DISP (ENDOMECHANICALS) ×2 IMPLANT
SET IRRIG TUBING LAPAROSCOPIC (IRRIGATION / IRRIGATOR) ×2 IMPLANT
SET TUBE SMOKE EVAC HIGH FLOW (TUBING) ×2 IMPLANT
SLEEVE XCEL OPT CAN 5 100 (ENDOMECHANICALS) ×4 IMPLANT
SUT ETHILON 3 0 PS 1 (SUTURE) ×1 IMPLANT
SUT MNCRL AB 4-0 PS2 18 (SUTURE) ×2 IMPLANT
TOWEL OR 17X26 10 PK STRL BLUE (TOWEL DISPOSABLE) ×2 IMPLANT
TOWEL OR NON WOVEN STRL DISP B (DISPOSABLE) ×2 IMPLANT
TRAY LAPAROSCOPIC (CUSTOM PROCEDURE TRAY) ×2 IMPLANT
TROCAR BLADELESS OPT 5 100 (ENDOMECHANICALS) ×2 IMPLANT
TROCAR XCEL BLUNT TIP 100MML (ENDOMECHANICALS) ×2 IMPLANT

## 2020-06-10 NOTE — Anesthesia Procedure Notes (Signed)
Procedure Name: Intubation Date/Time: 06/10/2020 11:47 AM Performed by: Niel Hummer, CRNA Pre-anesthesia Checklist: Patient identified, Emergency Drugs available, Patient being monitored and Suction available Patient Re-evaluated:Patient Re-evaluated prior to induction Oxygen Delivery Method: Circle system utilized Preoxygenation: Pre-oxygenation with 100% oxygen Induction Type: IV induction and Rapid sequence Laryngoscope Size: Mac and 4 Grade View: Grade I Tube type: Oral Tube size: 7.0 mm Number of attempts: 1 Airway Equipment and Method: Stylet Placement Confirmation: ETT inserted through vocal cords under direct vision,  positive ETCO2 and breath sounds checked- equal and bilateral Secured at: 21 cm Tube secured with: Tape Dental Injury: Teeth and Oropharynx as per pre-operative assessment

## 2020-06-10 NOTE — Anesthesia Postprocedure Evaluation (Signed)
Anesthesia Post Note  Patient: ZANOVIA ROTZ  Procedure(s) Performed: LAPAROSCOPIC CHOLECYSTECTOMY (N/A )     Patient location during evaluation: PACU Anesthesia Type: General Level of consciousness: awake and alert Pain management: pain level controlled Vital Signs Assessment: post-procedure vital signs reviewed and stable Respiratory status: spontaneous breathing, nonlabored ventilation, respiratory function stable and patient connected to nasal cannula oxygen Cardiovascular status: blood pressure returned to baseline and stable Postop Assessment: no apparent nausea or vomiting Anesthetic complications: no   No complications documented.  Last Vitals:  Vitals:   06/10/20 1655 06/10/20 1700  BP: (!) 96/54 106/64  Pulse: 86   Resp: 14   Temp: 36.8 C   SpO2: 99%     Last Pain:  Vitals:   06/10/20 1655  TempSrc: Oral  PainSc:                  Trevor Iha

## 2020-06-10 NOTE — Progress Notes (Signed)
Initial Nutrition Assessment  INTERVENTION:   -Will monitor PO intakes for nutrition supplement needs following surgery  NUTRITION DIAGNOSIS:   Inadequate oral intake related to acute illness (acute cholecystitis) as evidenced by per patient/family report.  GOAL:   Patient will meet greater than or equal to 90% of their needs  MONITOR:   PO intake, Supplement acceptance, Diet advancement, Labs, Weight trends, I & O's  REASON FOR ASSESSMENT:   Malnutrition Screening Tool    ASSESSMENT:   59 year old female with past medical history of rheumatoid arthritis on immunomodulators, tobacco use, pneumococcal pneumonia in June 2015 resulting cavitation and unfortunately chronic aspergillosis colonization with aspergilloma and hemoptysis (on voriconazole), HFpEF (last EF 60 to 65%, 09/20/2019) who presented to the ED with lower abdominal pain and vomiting that started yesterday evening around 5 PM after eating at a new restaurant for lunch and persisted and worsened throughout the night. Admitted forCholelithiasis and acute cholecystitis.  Patient currently NPO and in OR for lap cholecystectomy.  Pt has been sick following a meal on 10/3. Pt was having N/V and abdominal pain.  CT on 10/4 revealed acute cholecystitis. Will monitor PO intakes once diet is advanced to see if supplements are warranted.  Per weight records, no weight loss noted.  Labs reviewed. Medications: Zofran  NUTRITION - FOCUSED PHYSICAL EXAM:  In surgery  Diet Order:   Diet Order            Diet NPO time specified  Diet effective midnight                 EDUCATION NEEDS:   Not appropriate for education at this time  Skin:  Skin Assessment: Reviewed RN Assessment  Last BM:  10/3  Height:   Ht Readings from Last 1 Encounters:  06/09/20 5\' 3"  (1.6 m)    Weight:   Wt Readings from Last 1 Encounters:  06/10/20 70.6 kg   BMI:  Body mass index is 27.57 kg/m.  Estimated Nutritional Needs:    Kcal:  1750-1950  Protein:  80-90g  Fluid:  1.9L/day  08/28/2020, MS, RD, LDN Inpatient Clinical Dietitian Contact information available via Amion

## 2020-06-10 NOTE — Interval H&P Note (Signed)
History and Physical Interval Note:  06/10/2020 10:29 AM  Kimberly Shelton  has presented today for surgery, with the diagnosis of ACUTE CHOLECYSTITIS.  The various methods of treatment have been discussed with the patient and family. After consideration of risks, benefits and other options for treatment, the patient has consented to  Procedure(s): LAPAROSCOPIC CHOLECYSTECTOMY WITH POSSIBLE  INTRAOPERATIVE CHOLANGIOGRAM (N/A) as a surgical intervention.  The patient's history has been reviewed, patient examined, no change in status, stable for surgery.  I have reviewed the patient's chart and labs.  Questions were answered to the patient's satisfaction.     Chevis Pretty III

## 2020-06-10 NOTE — Transfer of Care (Signed)
Immediate Anesthesia Transfer of Care Note  Patient: Kimberly Shelton  Procedure(s) Performed: LAPAROSCOPIC CHOLECYSTECTOMY (N/A )  Patient Location: PACU  Anesthesia Type:General  Level of Consciousness: awake, alert  and oriented  Airway & Oxygen Therapy: Patient Spontanous Breathing and Patient connected to face mask oxygen  Post-op Assessment: Report given to RN, Post -op Vital signs reviewed and stable and Patient moving all extremities X 4  Post vital signs: Reviewed and stable  Last Vitals:  Vitals Value Taken Time  BP    Temp    Pulse 97 06/10/20 1258  Resp 19 06/10/20 1258  SpO2 100 % 06/10/20 1258  Vitals shown include unvalidated device data.  Last Pain:  Vitals:   06/10/20 1009  TempSrc:   PainSc: 0-No pain      Patients Stated Pain Goal: 2 (06/10/20 0933)  Complications: No complications documented.

## 2020-06-10 NOTE — Op Note (Signed)
06/09/2020 - 06/10/2020  12:39 PM  PATIENT:  Kimberly Shelton  59 y.o. female  PRE-OPERATIVE DIAGNOSIS:  ACUTE CHOLECYSTITIS WITH CHOLELITHIASIS  POST-OPERATIVE DIAGNOSIS:  ACUTE CHOLECYSTITIS WITH CHOLELITHIASIS  PROCEDURE:  Procedure(s): LAPAROSCOPIC CHOLECYSTECTOMY (N/A)  SURGEON:  Surgeon(s) and Role:    * Griselda Miner, MD - Primary  PHYSICIAN ASSISTANT:   ASSISTANTS: Trixie Deis, PA   ANESTHESIA:   local and general  EBL:  50cc   BLOOD ADMINISTERED:none  DRAINS: (1) Jackson-Pratt drain(s) with closed bulb suction in the gallbladder bed of liver   LOCAL MEDICATIONS USED:  MARCAINE     SPECIMEN:  Source of Specimen:  gallbladder  DISPOSITION OF SPECIMEN:  PATHOLOGY  COUNTS:  YES  TOURNIQUET:  * No tourniquets in log *  DICTATION: .Dragon Dictation     Procedure: After informed consent was obtained the patient was brought to the operating room and placed in the supine position on the operating room table. After adequate induction of general anesthesia the patient's abdomen was prepped with ChloraPrep allowed to dry and draped in usual sterile manner. An appropriate timeout was performed. The area below the umbilicus was infiltrated with quarter percent  Marcaine. A small incision was made with a 15 blade knife. The incision was carried down through the subcutaneous tissue bluntly with a hemostat and Army-Navy retractors. The linea alba was identified. The linea alba was incised with a 15 blade knife and each side was grasped with Coker clamps. The preperitoneal space was then probed with a hemostat until the peritoneum was opened and access was gained to the abdominal cavity. A 0 Vicryl pursestring stitch was placed in the fascia surrounding the opening. A Hassan cannula was then placed through the opening and anchored in place with the previously placed Vicryl purse string stitch. The abdomen was insufflated with carbon dioxide without difficulty. A laparoscope was  inserted through the [facility] cannula in the right upper quadrant was inspected. Next the epigastric region was infiltrated with % Marcaine. A small incision was made with a 15 blade knife. A 5 mm port was placed bluntly through this incision into the abdominal cavity under direct vision. Next 2 sites were chosen laterally on the right side of the abdomen for placement of 5 mm ports. Each of these areas was infiltrated with quarter percent Marcaine. Small stab incisions were made with a 15 blade knife. 5 mm ports were then placed bluntly through these incisions into the abdominal cavity under direct vision without difficulty. A blunt grasper was placed through the lateralmost 5 mm port and used to grasp the dome of the gallbladder and elevate it anteriorly and superiorly. The gallbladder was very inflamed and had to be aspirated in order to grasp the dome. Another blunt grasper was placed through the other 5 mm port and used to retract the body and neck of the gallbladder. A dissector was placed through the epigastric port. Blunt dissection was then carried out in this area of the gallbladder neck until the gallbladder neck-cystic duct junction was readily identified and a good window was created. The anatomy was seen well and we had a good critical window. Because of the friable nature of the tissue and the amount of inflammation we were not able to obtain a cholangiogram. 2 clips were placed proximally on the cystic duct and one distally and the duct was divided between the 2 sets of clips. Posterior to this the cystic artery was identified and again dissected bluntly in a circumferential manner  until a good window  was created. 2 clips were placed proximally and one distally on the artery and the artery was divided between the 2 sets of clips. Next a laparoscopic hook cautery device was used to separate the gallbladder from the liver bed. Prior to completely detaching the gallbladder from the liver bed the liver  bed was inspected and several small bleeding points were coagulated with the electrocautery until the area was completely hemostatic. The gallbladder was then detached the rest of it from the liver bed without difficulty. Some pus and stones were spilled from the aspiration point during the dissection. A laparoscopic bag was inserted through the hassan port. The laparoscope was moved to the epigastric port. The gallbladder was placed within the bag and the bag was sealed.  The bag with the gallbladder was then removed with the [facility] cannula through the infraumbilical port without difficulty. The fascial defect was then closed with the previously placed Vicryl pursestring stitch as well as with another figure-of-eight 0 Vicryl stitch. The liver bed was inspected again and found to be hemostatic. The abdomen was irrigated with copious amounts of saline until the effluent was clear. A 19 french blake drain was then brought through the epigastric port and out the lateralmost port site. The drain was anchored to the skin with a 3-0 nylon stitch. The drain was placed in the gallbladder bed of the liver. The ports were then removed under direct vision without difficulty and were found to be hemostatic. The gas was allowed to escape. The skin incisions were all closed with interrupted 4-0 Monocryl subcuticular stitches. Dermabond dressings were applied. The patient tolerated the procedure well. At the end of the case all needle sponge and instrument counts were correct. The patient was then awakened and taken to recovery in stable condition  PLAN OF CARE: Admit to inpatient   PATIENT DISPOSITION:  PACU - hemodynamically stable.   Delay start of Pharmacological VTE agent (>24hrs) due to surgical blood loss or risk of bleeding: no

## 2020-06-10 NOTE — Progress Notes (Signed)
PROGRESS NOTE  Kimberly Shelton BPZ:[id number] DOB: October 30, 1960   PCP: Kristian Covey, MD  Patient is from: Home  DOA: 06/09/2020 LOS: 1  Brief Narrative / Interim history: 59 year old female with PMH of RA.  On Plaquenil and low-dose prednisone, ILD, RUL cavitary lesion thought to be aspergillosis for which she was treated with voriconazole, and followed by pulmonology, chronic hemoptysis, diastolic CHF and tobacco use presenting with lower abdominal pain and vomiting for 1 day, and found to have acute cholecystitis cystitis.    In ED, hemodynamically stable.  CT abdomen and pelvis with cholelithiasis, acute cholecystitis and pancreatic duct dilation to 6 mm in the head of pancreas.  LFT and lipase within normal.  MRCP recommended. Started on IV antibiotics.  General surgery consulted.  MRCP ordered.  MRCP confirms cholelithiasis with suspected acute cholecystitis, and also CBD to 11 mm with suspected 5 mm distal CBD stone at the ampulla.   Pulmonology consulted and cleared patient for surgery from pulmonary standpoint.  Patient to undergo lap chole with cholangiogram today (10/5).  Subjective: Seen and examined earlier this morning.  No major events overnight or this morning.  Still with some pain across lower abdomen and RUQ but improved.  Some hemoptysis with blood tinged sputum earlier this morning which is chronic.  Denies chest pain or dyspnea.  Objective: Vitals:   06/10/20 0247 06/10/20 0500 06/10/20 0627 06/10/20 0900  BP: 129/68  126/75 139/79  Pulse: 96  99 (!) 102  Resp: 16  17 17   Temp: 99 F (37.2 C)  99.8 F (37.7 C) 98.9 F (37.2 C)  TempSrc:   Oral Oral  SpO2: 97%  97% 96%  Weight:  70.6 kg    Height:        Intake/Output Summary (Last 24 hours) at 06/10/2020 1151 Last data filed at 06/10/2020 0700 Gross per 24 hour  Intake 1730 ml  Output --  Net 1730 ml   Filed Weights   06/09/20 0048 06/10/20 0500  Weight: 65.3 kg 70.6 kg     Examination:  GENERAL: No apparent distress.  Nontoxic. HEENT: MMM.  Vision and hearing grossly intact.  NECK: Supple.  No apparent JVD.  RESP:  No IWOB.  Fair aeration bilaterally. CVS:  RRR. Heart sounds normal.  ABD/GI/GU: BS+. Abd soft.  Diffuse tenderness, more over RUQ. MSK/EXT:  Moves extremities. No apparent deformity. No edema.  SKIN: no apparent skin lesion or wound NEURO: Awake, alert and oriented appropriately.  No apparent focal neuro deficit. PSYCH: Calm. Normal affect.  Procedures:  None  Microbiology summarized: COVID-19 PCR negative. Influenza PCR negative. MRSA PCR negative.  Assessment & Plan: Acute calculus cholecystitis with possible choledocholithiasis-presents with abdominal pain and emesis.  LFT and lipase within normal.  CT abdomen and pelvis and MRCP as above. -N.p.o. for planned lap chole with cholangiogram today -Continue IV cefepime and IV Flagyl -IV fluid, analgesics and antiemetics -Cleared for surgery by pulmonology from pulmonary standpoint. -On stress dose steroid perioperatively given chronic prednisone use although very low dose. -Need GI for ERCP?  LFT and lipase within normal.  Chronic hemoptysis/RUL cavitary lesion likely due to aspergillosis-treated with voriconazole  -Continue voriconazole per pulmonology  Interstitial lung disease: FEV1 63% of predicted.  Treated with OFEV that was stopped about a month ago due to GI side effects.  Rheumatoid arthritis: Stable.  On Plaquenil and low-dose prednisone at home. -Now on stress dose steroid perioperatively. -Continue home Plaquenil  Chronic diastolic CHF: Stable.  Appears euvolemic.  Not on diuretics. -Monitor fluid status  Leukocytosis: Likely due to #1.  Normocytic anemia: Stable. -Monitor    Body mass index is 27.57 kg/m. Nutrition Problem: Inadequate oral intake Etiology: acute illness (acute cholecystitis) Signs/Symptoms: per patient/family report Interventions:  Refer to RD note for recommendations   DVT prophylaxis:  Place and maintain sequential compression device Start: 06/09/20 1404 SCDs Start: 06/09/20 0830  Code Status: Full code Family Communication: Patient and/or RN. Available if any question.  Status is: Inpatient  Remains inpatient appropriate because:IV treatments appropriate due to intensity of illness or inability to take PO and Inpatient level of care appropriate due to severity of illness   Dispo: The patient is from: Home              Anticipated d/c is to: Home              Anticipated d/c date is: 2 days              Patient currently is not medically stable to d/c.       Consultants:  General surgery Pulmonology   Sch Meds:  Scheduled Meds: . [MAR Hold] acetaminophen  1,000 mg Oral TID  . acetaminophen  1,000 mg Oral On Call to OR  . acetaminophen  1,000 mg Oral Once  . [MAR Hold] escitalopram  10 mg Oral Daily  . [MAR Hold] hydrocortisone sod succinate (SOLU-CORTEF) inj  50 mg Intravenous Q6H  . [MAR Hold] hydroxychloroquine  200 mg Oral BID  . ketorolac  15 mg Intravenous On Call to OR  . [MAR Hold] lip balm  1 application Topical BID  . scopolamine  1 patch Transdermal On Call to OR  . [MAR Hold] voriconazole  200 mg Oral Q12H   Continuous Infusions: . [MAR Hold] ceFEPime (MAXIPIME) IV 2 g (06/10/20 0754)  . [MAR Hold] lactated ringers    . [MAR Hold] lactated ringers    . [MAR Hold] methocarbamol (ROBAXIN) IV    . [MAR Hold] metronidazole 500 mg (06/10/20 0837)  . [MAR Hold] ondansetron (ZOFRAN) IV     PRN Meds:.[MAR Hold] acetaminophen **OR** [MAR Hold] acetaminophen, [MAR Hold] alum & mag hydroxide-simeth, [MAR Hold] diphenhydrAMINE, [MAR Hold] HYDROcodone-homatropine, [MAR Hold] lactated ringers, [MAR Hold] magic mouthwash, [MAR Hold] menthol-cetylpyridinium, [MAR Hold] methocarbamol (ROBAXIN) IV, [MAR Hold] metoprolol tartrate, [MAR Hold]  morphine injection, [MAR Hold] ondansetron (ZOFRAN) IV  **OR** [MAR Hold] ondansetron (ZOFRAN) IV, [MAR Hold] oxyCODONE, [MAR Hold] phenol, [MAR Hold] prochlorperazine  Antimicrobials: Anti-infectives (From admission, onward)   Start     Dose/Rate Route Frequency Ordered Stop   06/09/20 1330  [MAR Hold]  hydroxychloroquine (PLAQUENIL) tablet 200 mg        (MAR Hold since Tue 06/10/2020 at 0958.Hold Reason: Transfer to a Procedural area.)   200 mg Oral 2 times daily 06/09/20 0829     06/09/20 1330  [MAR Hold]  voriconazole (VFEND) tablet 200 mg        (MAR Hold since Tue 06/10/2020 at 0958.Hold Reason: Transfer to a Procedural area.)   200 mg Oral Every 12 hours 06/09/20 0829     06/09/20 0700  [MAR Hold]  ceFEPIme (MAXIPIME) 2 g in sodium chloride 0.9 % 100 mL IVPB        (MAR Hold since Tue 06/10/2020 at 0958.Hold Reason: Transfer to a Procedural area.)   2 g 200 mL/hr over 30 Minutes Intravenous Every 8 hours 06/09/20 0635     06/09/20 0700  [MAR Hold]  metroNIDAZOLE (  FLAGYL) IVPB 500 mg        (MAR Hold since Tue 06/10/2020 at 0958.Hold Reason: Transfer to a Procedural area.)   500 mg 100 mL/hr over 60 Minutes Intravenous Every 6 hours 06/09/20 0635     06/09/20 0615  cefTRIAXone (ROCEPHIN) 1 g in sodium chloride 0.9 % 100 mL IVPB  Status:  Discontinued        1 g 200 mL/hr over 30 Minutes Intravenous  Once 06/09/20 0611 06/09/20 0700       I have personally reviewed the following labs and images: CBC: Recent Labs  Lab 06/09/20 0116 06/10/20 0336  WBC 14.3* 23.4*  HGB 12.0 10.8*  HCT 37.1 34.4*  MCV 88.8 92.0  PLT 302 261   BMP &GFR Recent Labs  Lab 06/09/20 0116 06/09/20 0635 06/10/20 0336  NA 136  --  137  K 4.1  --  4.2  CL 97*  --  105  CO2 27  --  22  GLUCOSE 162*  --  98  BUN 15  --  16  CREATININE 0.71  --  0.66  CALCIUM 9.3  --  8.2*  MG  --  1.9  --   PHOS  --  2.5  --    Estimated Creatinine Clearance: 70.5 mL/min (by C-G formula based on SCr of 0.66 mg/dL). Liver & Pancreas: Recent Labs  Lab 06/09/20 0116   AST 18  ALT 12  ALKPHOS 126  BILITOT 0.6  PROT 7.8  ALBUMIN 3.8   Recent Labs  Lab 06/09/20 0116  LIPASE 21   No results for input(s): AMMONIA in the last 168 hours. Diabetic: No results for input(s): HGBA1C in the last 72 hours. No results for input(s): GLUCAP in the last 168 hours. Cardiac Enzymes: No results for input(s): CKTOTAL, CKMB, CKMBINDEX, TROPONINI in the last 168 hours. No results for input(s): PROBNP in the last 8760 hours. Coagulation Profile: No results for input(s): INR, PROTIME in the last 168 hours. Thyroid Function Tests: No results for input(s): TSH, T4TOTAL, FREET4, T3FREE, THYROIDAB in the last 72 hours. Lipid Profile: No results for input(s): CHOL, HDL, LDLCALC, TRIG, CHOLHDL, LDLDIRECT in the last 72 hours. Anemia Panel: No results for input(s): VITAMINB12, FOLATE, FERRITIN, TIBC, IRON, RETICCTPCT in the last 72 hours. Urine analysis:    Component Value Date/Time   COLORURINE YELLOW 06/09/2020 0117   APPEARANCEUR CLEAR 06/09/2020 0117   LABSPEC 1.031 (H) 06/09/2020 0117   PHURINE 6.0 06/09/2020 0117   GLUCOSEU NEGATIVE 06/09/2020 0117   HGBUR NEGATIVE 06/09/2020 0117   BILIRUBINUR NEGATIVE 06/09/2020 0117   BILIRUBINUR n 07/21/2015 1428   KETONESUR 5 (A) 06/09/2020 0117   PROTEINUR 30 (A) 06/09/2020 0117   UROBILINOGEN negative 07/21/2015 1428   UROBILINOGEN 2.0 (H) 02/16/2014 1226   NITRITE NEGATIVE 06/09/2020 0117   LEUKOCYTESUR NEGATIVE 06/09/2020 0117   Sepsis Labs: Invalid input(s): PROCALCITONIN, LACTICIDVEN  Microbiology: Recent Results (from the past 240 hour(s))  Respiratory Panel by RT PCR (Flu A&B, Covid) - Nasopharyngeal Swab     Status: None   Collection Time: 06/09/20  6:03 AM   Specimen: Nasopharyngeal Swab  Result Value Ref Range Status   SARS Coronavirus 2 by RT PCR NEGATIVE NEGATIVE Final    Comment: (NOTE) SARS-CoV-2 target nucleic acids are NOT DETECTED.  The SARS-CoV-2 RNA is generally detectable in upper  respiratoy specimens during the acute phase of infection. The lowest concentration of SARS-CoV-2 viral copies this assay can detect is 131 copies/mL. A negative result  does not preclude SARS-Cov-2 infection and should not be used as the sole basis for treatment or other patient management decisions. A negative result may occur with  improper specimen collection/handling, submission of specimen other than nasopharyngeal swab, presence of viral mutation(s) within the areas targeted by this assay, and inadequate number of viral copies (<131 copies/mL). A negative result must be combined with clinical observations, patient history, and epidemiological information. The expected result is Negative.  Fact Sheet for Patients:  https://www.moore.com/  Fact Sheet for Healthcare Providers:  https://www.young.biz/  This test is no t yet approved or cleared by the [place] FDA and  has been authorized for detection and/or diagnosis of SARS-CoV-2 by FDA under an Emergency Use Authorization (EUA). This EUA will remain  in effect (meaning this test can be used) for the duration of the COVID-19 declaration under Section 564(b)(1) of the Act, 21 U.S.C. section 360bbb-3(b)(1), unless the authorization is terminated or revoked sooner.     Influenza A by PCR NEGATIVE NEGATIVE Final   Influenza B by PCR NEGATIVE NEGATIVE Final    Comment: (NOTE) The Xpert Xpress SARS-CoV-2/FLU/RSV assay is intended as an aid in  the diagnosis of influenza from Nasopharyngeal swab specimens and  should not be used as a sole basis for treatment. Nasal washings and  aspirates are unacceptable for Xpert Xpress SARS-CoV-2/FLU/RSV  testing.  Fact Sheet for Patients: https://www.moore.com/  Fact Sheet for Healthcare Providers: https://www.young.biz/  This test is not yet approved or cleared by the [place] FDA and  has been  authorized for detection and/or diagnosis of SARS-CoV-2 by  FDA under an Emergency Use Authorization (EUA). This EUA will remain  in effect (meaning this test can be used) for the duration of the  Covid-19 declaration under Section 564(b)(1) of the Act, 21  U.S.C. section 360bbb-3(b)(1), unless the authorization is  terminated or revoked. Performed at [facility], [address] [place], [place] [address]   MRSA PCR Screening     Status: None   Collection Time: 06/09/20 12:58 PM   Specimen: Nasal Mucosa; Nasopharyngeal  Result Value Ref Range Status   MRSA by PCR NEGATIVE NEGATIVE Final    Comment:        The GeneXpert MRSA Assay (FDA approved for NASAL specimens only), is one component of a comprehensive MRSA colonization surveillance program. It is not intended to diagnose MRSA infection nor to guide or monitor treatment for MRSA infections. Performed at [facility], [address] [place], [place] [address]     Radiology Studies: [facility] Chest Port 1 View  Result Date: 06/09/2020 CLINICAL DATA:  Short of breath EXAM: PORTABLE CHEST 1 VIEW COMPARISON:  Chest radiograph 08/16/2019, CT 09/26/2019 FINDINGS: Normal cardiac silhouette. Upper lobe interstitial thickening again noted. Cavitary change in the RIGHT upper lobe. More mild peripheral interstitial disease at the LEFT lung base. No new focal consolidation. No pneumothorax. IMPRESSION: No interval change in upper lobe interstitial lung disease with cavitation in the RIGHT upper lobe. Electronically Signed   By: Genevive Bi M.D.   On: 06/09/2020 14:28     Maicy Filip T. Uel Davidow [facility]  If 7PM-7AM, please contact night-coverage [email or web address] 06/10/2020, 11:51 AM

## 2020-06-11 ENCOUNTER — Encounter (HOSPITAL_COMMUNITY): Payer: Self-pay | Admitting: General Surgery

## 2020-06-11 DIAGNOSIS — J479 Bronchiectasis, uncomplicated: Secondary | ICD-10-CM | POA: Diagnosis not present

## 2020-06-11 DIAGNOSIS — K8000 Calculus of gallbladder with acute cholecystitis without obstruction: Secondary | ICD-10-CM | POA: Diagnosis not present

## 2020-06-11 LAB — CBC WITH DIFFERENTIAL/PLATELET
Abs Immature Granulocytes: 0.15 10*3/uL — ABNORMAL HIGH (ref 0.00–0.07)
Basophils Absolute: 0 10*3/uL (ref 0.0–0.1)
Basophils Relative: 0 %
Eosinophils Absolute: 0 10*3/uL (ref 0.0–0.5)
Eosinophils Relative: 0 %
HCT: 25.3 % — ABNORMAL LOW (ref 36.0–46.0)
Hemoglobin: 7.8 g/dL — ABNORMAL LOW (ref 12.0–15.0)
Immature Granulocytes: 1 %
Lymphocytes Relative: 4 %
Lymphs Abs: 0.7 10*3/uL (ref 0.7–4.0)
MCH: 29.4 pg (ref 26.0–34.0)
MCHC: 30.8 g/dL (ref 30.0–36.0)
MCV: 95.5 fL (ref 80.0–100.0)
Monocytes Absolute: 0.4 10*3/uL (ref 0.1–1.0)
Monocytes Relative: 2 %
Neutro Abs: 17.6 10*3/uL — ABNORMAL HIGH (ref 1.7–7.7)
Neutrophils Relative %: 93 %
Platelets: 232 10*3/uL (ref 150–400)
RBC: 2.65 MIL/uL — ABNORMAL LOW (ref 3.87–5.11)
RDW: 14.5 % (ref 11.5–15.5)
WBC: 18.8 10*3/uL — ABNORMAL HIGH (ref 4.0–10.5)
nRBC: 0 % (ref 0.0–0.2)

## 2020-06-11 LAB — COMPREHENSIVE METABOLIC PANEL
ALT: 28 U/L (ref 0–44)
AST: 36 U/L (ref 15–41)
Albumin: 2.3 g/dL — ABNORMAL LOW (ref 3.5–5.0)
Alkaline Phosphatase: 179 U/L — ABNORMAL HIGH (ref 38–126)
Anion gap: 6 (ref 5–15)
BUN: 35 mg/dL — ABNORMAL HIGH (ref 6–20)
CO2: 23 mmol/L (ref 22–32)
Calcium: 7.7 mg/dL — ABNORMAL LOW (ref 8.9–10.3)
Chloride: 105 mmol/L (ref 98–111)
Creatinine, Ser: 0.93 mg/dL (ref 0.44–1.00)
GFR calc non Af Amer: >60 mL/min (ref >60)
Glucose, Bld: 143 mg/dL — ABNORMAL HIGH (ref 70–99)
Potassium: 4.5 mmol/L (ref 3.5–5.1)
Sodium: 134 mmol/L — ABNORMAL LOW (ref 135–145)
Total Bilirubin: 0.7 mg/dL (ref 0.3–1.2)
Total Protein: 5.3 g/dL — ABNORMAL LOW (ref 6.5–8.1)

## 2020-06-11 LAB — RETICULOCYTES
Immature Retic Fract: 23.3 % — ABNORMAL HIGH (ref 2.3–15.9)
RBC.: 2.64 MIL/uL — ABNORMAL LOW (ref 3.87–5.11)
Retic Count, Absolute: 56.2 10*3/uL (ref 19.0–186.0)
Retic Ct Pct: 2.1 % (ref 0.4–3.1)

## 2020-06-11 LAB — FERRITIN: Ferritin: 127 ng/mL (ref 11–307)

## 2020-06-11 LAB — IRON AND TIBC
Iron: 60 ug/dL (ref 28–170)
Saturation Ratios: 27 % (ref 10.4–31.8)
TIBC: 220 ug/dL — ABNORMAL LOW (ref 250–450)
UIBC: 160 ug/dL

## 2020-06-11 LAB — PHOSPHORUS
Phosphorus: 1.2 mg/dL — ABNORMAL LOW (ref 2.5–4.6)
Phosphorus: 2.5 mg/dL (ref 2.5–4.6)

## 2020-06-11 LAB — VITAMIN B12: Vitamin B-12: 564 pg/mL (ref 180–914)

## 2020-06-11 LAB — MAGNESIUM: Magnesium: 2.4 mg/dL (ref 1.7–2.4)

## 2020-06-11 LAB — SURGICAL PATHOLOGY

## 2020-06-11 LAB — FOLATE: Folate: 6.5 ng/mL (ref >5.9)

## 2020-06-11 MED ORDER — HYDROCORTISONE NA SUCCINATE PF 100 MG IJ SOLR
50.0000 mg | Freq: Two times a day (BID) | INTRAMUSCULAR | Status: AC
  Administered 2020-06-11: 50 mg via INTRAVENOUS
  Filled 2020-06-11: qty 1

## 2020-06-11 MED ORDER — SODIUM CHLORIDE 0.9 % IV SOLN: INTRAVENOUS | Status: DC

## 2020-06-11 MED ORDER — K PHOS MONO-SOD PHOS DI & MONO 155-852-130 MG PO TABS
250.0000 mg | ORAL_TABLET | Freq: Three times a day (TID) | ORAL | Status: AC
  Administered 2020-06-11 – 2020-06-12 (×6): 250 mg via ORAL
  Filled 2020-06-11 (×7): qty 1

## 2020-06-11 MED ORDER — PREDNISONE 5 MG PO TABS
5.0000 mg | ORAL_TABLET | Freq: Every day | ORAL | Status: AC
  Administered 2020-06-14: 5 mg via ORAL
  Filled 2020-06-11: qty 1

## 2020-06-11 MED ORDER — PREDNISONE 5 MG PO TABS
2.5000 mg | ORAL_TABLET | Freq: Every day | ORAL | Status: DC
  Administered 2020-06-15 – 2020-06-16 (×2): 2.5 mg via ORAL
  Filled 2020-06-11 (×3): qty 1

## 2020-06-11 MED ORDER — METHOCARBAMOL 500 MG PO TABS: 500.0000 mg | ORAL_TABLET | Freq: Four times a day (QID) | ORAL | Status: DC | PRN

## 2020-06-11 MED ORDER — PREDNISOLONE 5 MG PO TABS
7.5000 mg | ORAL_TABLET | Freq: Every day | ORAL | Status: AC
  Administered 2020-06-13: 7.5 mg via ORAL
  Filled 2020-06-11: qty 2

## 2020-06-11 MED ORDER — SODIUM PHOSPHATES 45 MMOLE/15ML IV SOLN
30.0000 mmol | Freq: Once | INTRAVENOUS | Status: AC
  Administered 2020-06-11: 30 mmol via INTRAVENOUS
  Filled 2020-06-11: qty 10

## 2020-06-11 MED ORDER — PREDNISONE 5 MG PO TABS
10.0000 mg | ORAL_TABLET | Freq: Every day | ORAL | Status: AC
  Administered 2020-06-12: 10 mg via ORAL
  Filled 2020-06-11: qty 2

## 2020-06-11 NOTE — Progress Notes (Signed)
NAME:  Kimberly Shelton, MRN:  [id number], DOB:  05-11-1961, LOS: 2 ADMISSION DATE:  06/09/2020, CONSULTATION DATE:  10/4 REFERRING MD:  Carolynne Edouard, CHIEF COMPLAINT:  Pulmonary clearance    Brief History   59 year old female admitted 10/4 w/ cc: 1 day h/o abd pain w/ associated fever, chills, N&V. Dx eval showing cholecystitis w/ cholelithiasis as well as pancreatic inflammation. She was seen by surgery who feel she would be best served by Cholecystectomy.  She is followed by Dr [name] since 2015  for ILD and aspergilloma    Past Medical History  RA ->now off immunosuppression and just on prednisone since 2019 2019 CT finding on chest bringing concern for RUL cavitation worrisome for aspergilloma. FEV 64%, Aspergillus antibody positive. So was referred to Infectious disease. Placed on Voriconazole Jan 2021 having Hemoptysis ->restarted Vori feb 20212 Did have Covid in Nov 2019 Placed on Pirfenidone April 2021 for ILD (progressive UIP pattern) but stopped due to GI complications now on Nitedanib    Significant Hospital Events   10/4 admitted  Consults:  pulm 10/4  Procedures:    Significant Diagnostic Tests:  Ct abd/pelvis 10/4: 1. Cholelithiasis with gallbladder wall thickening, pericholecystic edema, and fluid in the hepatoduodenal ligament/porta hepatis. Imaging features are compatible with acute cholecystitis. 2. Mild intrahepatic biliary duct prominence with dilatation of the main pancreatic duct up to 6 mm in the head of pancreas. No choledocholithiasis apparent by CT although stones are not densely calcified. No obstructing pancreatic head mass evident. ERCP or MRCP may prove helpful to further evaluate. 3. Stable 10 mm left lower lobe pulmonary nodule with chronic peripheral interstitial lung disease, better characterized on previous chest CT as consistent with UIP. Please see that report. 4. Tiny hiatal hernia. 5. Aortic Atherosclerosis (ICD10-I70.0).  MR abd MRCP  10/4: Cholelithiasis with suspected acute cholecystitis. Dilated common duct, measuring 11 mm proximally. Suspected 5 mm distal CBD stone at the ampulla, although equivocal. Fluid/stranding along the right mid abdominal mesentery and along the pancreatic head, favored to be secondary to the right upper quadrant inflammatory process. Micro Data:   Antimicrobials:  Cefepime 10/4>>> Flagyl 10/4>>  Interim history/subjective:   Still having some abd pain. Coughing about teaspoon of blood in am   Objective   Blood pressure (Abnormal) 102/56, pulse 83, temperature 98.3 F (36.8 C), temperature source Oral, resp. rate 16, height 5\' 3"  (1.6 m), weight 70.6 kg, last menstrual period 09/06/2004, SpO2 98 %.        Intake/Output Summary (Last 24 hours) at 06/11/2020 0946 Last data filed at 06/11/2020 0700 Gross per 24 hour  Intake 2230.07 ml  Output 190 ml  Net 2040.07 ml   Filed Weights   06/09/20 0048 06/10/20 0500  Weight: 65.3 kg 70.6 kg    Examination: General pleasant 59 year old female resting in bed. Not in distress hent ncat no JVD MMM pulm crackles both bases no accessory use  Card rrr abd tender JP drain intact  tol cl diet Neuro intact Resolved Hospital Problem list    Assessment & Plan:  Acute Cholecystitis w/ Cholelithiasis now s/p Lap cholecystectomy 10/5 Plan Abx, diet and post op care per surg  H/o ILD. Progressive UIP pattern Maintained on Nintedanib  Plan Cont pulse ox o2 as needed IS Mobilize Hold Nintedanib (can resume at dc)  H/o RA -she is on chronic steroids Plan Stress dose steroids (change to q 12) can go home on taper over 3 more days back to 2.5mg /d  H/o aspergilloma w/ chronic BTX and associated chronic hemoptysis (since 2019) Plan Cont vori scd given hemoptysis    Best practice:  Diet: cl  Pain/Anxiety/Delirium protocol (if indicated): NA VAP protocol (if indicated): NA DVT prophylaxis:  GI prophylaxis: PPI Glucose control:  NA Mobility: as tol Code Status: full code  Family Communication: per primary  Disposition we will s/o  Simonne Martinet ACNP-BC [facility] Pulmonary/Critical Care Pager # [phone] OR # [phone] if no answer    Simonne Martinet ACNP-BC [facility] Pulmonary/Critical Care Pager # [phone] OR # [phone] if no answer

## 2020-06-11 NOTE — Plan of Care (Signed)
  Problem: Education: Goal: Knowledge of General Education information will improve Description: Including pain rating scale, medication(s)/side effects and non-pharmacologic comfort measures Outcome: Progressing   Problem: Clinical Measurements: Goal: Will remain free from infection Outcome: Progressing   

## 2020-06-11 NOTE — Progress Notes (Signed)
PROGRESS NOTE    Kimberly Shelton  JGG:[id number] DOB: [birth year] DOA: 06/09/2020 PCP: Kristian Covey, MD   Chief Complaint  Patient presents with  . Abdominal Pain    Brief Narrative: As per previous attending : 59YoF w/  RA, on Plaquenil and low-dose prednisone, ILD/RUL cavitary lesion thought to be aspergillosis with chronic cough and hemoptysis being managed by pulmonary currently on voriconazole,diastolic CHF and tobacco use presenting with lower abdominal pain and vomiting for 1 day, and found to have  cholelithiasis, acute cholecystitis and pancreatic duct dilation to 6 mm in the head of pancreas.  LFT and lipase within normal.MRCP confirms cholelithiasis with suspected acute cholecystitis, and also CBD to 11 mm with suspected 5 mm distal CBD stone at the ampulla.  Seen by pulmonary, surgery. S/p laparoscopic cholecystectomy 06/10/2020 Dr. Carolynne Edouard  Subjective: Seen this morning.No new complaints. Had some cough,  Chronic with some blood in the cough every morning but not new.   Has not had a bowel movement yet. On Full liquid diet  Assessment & Plan:  Acute gangrenous cholecystitis with cholelithiasis: Status post laparoscopic cholecystectomy 06/10/2028 by Dr. Carolynne Edouard.  Surgery following, appreciate input continue to mobilize, continue IV antibiotics cefepime/fagyl, continue diet as tolerated.  Continue pain control.  Acute on chronic anemia likely multifactorial, status post surgery and also with chronic hemostasis: Had colonoscopy at the age of 36 followed by Dr. Loreta Ave.  Check anemia panel, fecal occult blood.  Denies any black or blood in the stool, no hematemesis. Will hold chemical prophylaxis for now. Recent Labs  Lab 06/09/20 0116 06/10/20 0336 06/11/20 0343  HGB 12.0 10.8* 7.8*  HCT 37.1 34.4* 25.3*   Hypophosphatemia is being replaced aggressively replete phosphorus level after replacement.  Interstitial lung disease in the setting of autoimmune disease/RA; on chronic  Plaquenil and prednisone, continue steroids and will taper going down to usual dose of 2.5 mg daily with an extra minute  Aspergilloma with associated chronic bronchiectasis/?  Invasive aspergillus disease on chronic voriconazole, pulmonary on board and appreciate input.  Chronic hemoptysis since 2019 with aspergilloma/chronic bronchiectasis: Continue management as #1 monitor H&H.  Pulmonary following.  Chronic diastolic heart failure: Euvolemic.  Not on diuretics.  Monitor fluid status.  Diet Order            Diet full liquid Room service appropriate? Yes; Fluid consistency: Thin  Diet effective now                 Nutrition Problem: Inadequate oral intake Etiology: acute illness (acute cholecystitis) Signs/Symptoms: per patient/family report Interventions: Refer to RD note for recommendations  Body mass index is 27.57 kg/m.  DVT prophylaxis: Place and maintain sequential compression device Start: 06/09/20 1404 SCDs Start: 06/09/20 0830 Code Status:   Code Status: Full Code  Family Communication: plan of care discussed with patient at bedside.  Status is: Inpatient Remains inpatient appropriate because:Inpatient level of care appropriate due to severity of illness and Ongoing monitoring of diet tolerance status post gallbladder surgery  Dispo: The patient is from: Home              Anticipated d/c is to: Home              Anticipated d/c date is: 1 day              Patient currently is not medically stable to d/c. Consultants:see note  Procedures:see note  Culture/Microbiology    Component Value Date/Time   SDES  11/27/2019 1529  BRONCHIAL ALVEOLAR LAVAGE RUL Performed at [facility], [address], [place], [place] [address]    SPECREQUEST  11/27/2019 1529    Immunocompromised Performed at [facility], [address] [place], [place] [address]    CULT  11/27/2019 1529    FEW Consistent with normal respiratory  flora. Performed at [facility], [address], [place], [place] [address]    REPTSTATUS 11/30/2019 FINAL 11/27/2019 1529    Other culture-see note  Medications: Scheduled Meds: . acetaminophen  1,000 mg Oral TID  . escitalopram  10 mg Oral Daily  . hydrocortisone sod succinate (SOLU-CORTEF) inj  50 mg Intravenous Q12H  . hydroxychloroquine  200 mg Oral BID  . lip balm  1 application Topical BID  . phosphorus  250 mg Oral TID  . [START ON 06/12/2020] predniSONE  10 mg Oral Q breakfast   Followed by  . [START ON 06/13/2020] prednisoLONE  7.5 mg Oral Daily   Followed by  . [START ON 06/14/2020] predniSONE  5 mg Oral Q breakfast   Followed by  . [START ON 06/15/2020] predniSONE  2.5 mg Oral Q breakfast  . voriconazole  200 mg Oral Q12H   Continuous Infusions: . sodium chloride    . ceFEPime (MAXIPIME) IV 2 g (06/11/20 0647)  . metronidazole 500 mg (06/11/20 1358)  . ondansetron (ZOFRAN) IV    . sodium phosphate  Dextrose 5% IVPB 30 mmol (06/11/20 1049)    Antimicrobials: Anti-infectives (From admission, onward)   Start     Dose/Rate Route Frequency Ordered Stop   06/09/20 1330  hydroxychloroquine (PLAQUENIL) tablet 200 mg        200 mg Oral 2 times daily 06/09/20 0829     06/09/20 1330  voriconazole (VFEND) tablet 200 mg        200 mg Oral Every 12 hours 06/09/20 0829     06/09/20 0700  ceFEPIme (MAXIPIME) 2 g in sodium chloride 0.9 % 100 mL IVPB        2 g 200 mL/hr over 30 Minutes Intravenous Every 8 hours 06/09/20 0635     06/09/20 0700  metroNIDAZOLE (FLAGYL) IVPB 500 mg        500 mg 100 mL/hr over 60 Minutes Intravenous Every 6 hours 06/09/20 0635     06/09/20 0615  cefTRIAXone (ROCEPHIN) 1 g in sodium chloride 0.9 % 100 mL IVPB  Status:  Discontinued        1 g 200 mL/hr over 30 Minutes Intravenous  Once 06/09/20 0611 06/09/20 0700     Objective: Vitals: Today's Vitals   06/11/20 0415 06/11/20 0645 06/11/20 0705 06/11/20 1313  BP:  (!) 102/56  (!) 115/58   Pulse:  83  78  Resp:  16  17  Temp:  98.3 F (36.8 C)  98.7 F (37.1 C)  TempSrc:  Oral  Oral  SpO2:  98%  97%  Weight:      Height:      PainSc: 0-No pain  2      Intake/Output Summary (Last 24 hours) at 06/11/2020 1404 Last data filed at 06/11/2020 1312 Gross per 24 hour  Intake 1630.07 ml  Output 980 ml  Net 650.07 ml   Filed Weights   06/09/20 0048 06/10/20 0500  Weight: 65.3 kg 70.6 kg   Weight change:   Intake/Output from previous day: 10/05 0701 - 10/06 0700 In: 2230.1 [P.O.:630; I.V.:1200; IV Piggyback:400.1] Out: 190 [Urine:100; Drains:80; Blood:10] Intake/Output this shift:  Total I/O In: 600 [P.O.:600] Out: 800 [Urine:800]  Examination: General exam: AAO ,NAD, weak appearing. HEENT:Oral mucosa moist, Ear/Nose WNL grossly,dentition normal. Respiratory system: bilaterally clear,no wheezing or crackles,no use of accessory muscle, non tender. Cardiovascular system: S1 & S2 +, regular, No JVD. Gastrointestinal system: Abdomen soft,, JP drain in place.   Nervous System:Alert, awake, moving extremities and grossly nonfocal Extremities: No edema, distal peripheral pulses palpable.  Skin: No rashes,no icterus. MSK: Normal muscle bulk,tone, power  Data Reviewed: I have personally reviewed following labs and imaging studies CBC: Recent Labs  Lab 06/09/20 0116 06/10/20 0336 06/11/20 0343  WBC 14.3* 23.4* 18.8*  NEUTROABS  --   --  17.6*  HGB 12.0 10.8* 7.8*  HCT 37.1 34.4* 25.3*  MCV 88.8 92.0 95.5  PLT 302 261 232   Basic Metabolic Panel: Recent Labs  Lab 06/09/20 0116 06/09/20 0635 06/10/20 0336 06/11/20 0343  NA 136  --  137 134*  K 4.1  --  4.2 4.5  CL 97*  --  105 105  CO2 27  --  22 23  GLUCOSE 162*  --  98 143*  BUN 15  --  16 35*  CREATININE 0.71  --  0.66 0.93  CALCIUM 9.3  --  8.2* 7.7*  MG  --  1.9  --  2.4  PHOS  --  2.5  --  1.2*   GFR: Estimated Creatinine Clearance: 60.6 mL/min (by C-G formula based on SCr of 0.93  mg/dL). Liver Function Tests: Recent Labs  Lab 06/09/20 0116 06/11/20 0343  AST 18 36  ALT 12 28  ALKPHOS 126 179*  BILITOT 0.6 0.7  PROT 7.8 5.3*  ALBUMIN 3.8 2.3*   Recent Labs  Lab 06/09/20 0116  LIPASE 21   No results for input(s): AMMONIA in the last 168 hours. Coagulation Profile: No results for input(s): INR, PROTIME in the last 168 hours. Cardiac Enzymes: No results for input(s): CKTOTAL, CKMB, CKMBINDEX, TROPONINI in the last 168 hours. BNP (last 3 results) No results for input(s): PROBNP in the last 8760 hours. HbA1C: No results for input(s): HGBA1C in the last 72 hours. CBG: No results for input(s): GLUCAP in the last 168 hours. Lipid Profile: No results for input(s): CHOL, HDL, LDLCALC, TRIG, CHOLHDL, LDLDIRECT in the last 72 hours. Thyroid Function Tests: No results for input(s): TSH, T4TOTAL, FREET4, T3FREE, THYROIDAB in the last 72 hours. Anemia Panel: No results for input(s): VITAMINB12, FOLATE, FERRITIN, TIBC, IRON, RETICCTPCT in the last 72 hours. Sepsis Labs: No results for input(s): PROCALCITON, LATICACIDVEN in the last 168 hours.  Recent Results (from the past 240 hour(s))  Respiratory Panel by RT PCR (Flu A&B, Covid) - Nasopharyngeal Swab     Status: None   Collection Time: 06/09/20  6:03 AM   Specimen: Nasopharyngeal Swab  Result Value Ref Range Status   SARS Coronavirus 2 by RT PCR NEGATIVE NEGATIVE Final    Comment: (NOTE) SARS-CoV-2 target nucleic acids are NOT DETECTED.  The SARS-CoV-2 RNA is generally detectable in upper respiratoy specimens during the acute phase of infection. The lowest concentration of SARS-CoV-2 viral copies this assay can detect is 131 copies/mL. A negative result does not preclude SARS-Cov-2 infection and should not be used as the sole basis for treatment or other patient management decisions. A negative result may occur with  improper specimen collection/handling, submission of specimen other than nasopharyngeal  swab, presence of viral mutation(s) within the areas targeted by this assay, and inadequate number of viral copies (<  131 copies/mL). A negative result must be combined with clinical observations, patient history, and epidemiological information. The expected result is Negative.  Fact Sheet for Patients:  https://www.moore.com/  Fact Sheet for Healthcare Providers:  https://www.young.biz/  This test is no t yet approved or cleared by the [place] FDA and  has been authorized for detection and/or diagnosis of SARS-CoV-2 by FDA under an Emergency Use Authorization (EUA). This EUA will remain  in effect (meaning this test can be used) for the duration of the COVID-19 declaration under Section 564(b)(1) of the Act, 21 U.S.C. section 360bbb-3(b)(1), unless the authorization is terminated or revoked sooner.     Influenza A by PCR NEGATIVE NEGATIVE Final   Influenza B by PCR NEGATIVE NEGATIVE Final    Comment: (NOTE) The Xpert Xpress SARS-CoV-2/FLU/RSV assay is intended as an aid in  the diagnosis of influenza from Nasopharyngeal swab specimens and  should not be used as a sole basis for treatment. Nasal washings and  aspirates are unacceptable for Xpert Xpress SARS-CoV-2/FLU/RSV  testing.  Fact Sheet for Patients: https://www.moore.com/  Fact Sheet for Healthcare Providers: https://www.young.biz/  This test is not yet approved or cleared by the [place] FDA and  has been authorized for detection and/or diagnosis of SARS-CoV-2 by  FDA under an Emergency Use Authorization (EUA). This EUA will remain  in effect (meaning this test can be used) for the duration of the  Covid-19 declaration under Section 564(b)(1) of the Act, 21  U.S.C. section 360bbb-3(b)(1), unless the authorization is  terminated or revoked. Performed at [facility], [address] [place], [place] [address]    MRSA PCR Screening     Status: None   Collection Time: 06/09/20 12:58 PM   Specimen: Nasal Mucosa; Nasopharyngeal  Result Value Ref Range Status   MRSA by PCR NEGATIVE NEGATIVE Final    Comment:        The GeneXpert MRSA Assay (FDA approved for NASAL specimens only), is one component of a comprehensive MRSA colonization surveillance program. It is not intended to diagnose MRSA infection nor to guide or monitor treatment for MRSA infections. Performed at [facility], [address] [place], [place] [address]     Radiology Studies: No results found.   LOS: 2 days   Lanae Boast, MD [facility]  06/11/2020, 2:04 PM

## 2020-06-11 NOTE — Progress Notes (Signed)
[facility] Progress Note  1 Day Post-Op  Subjective: Patient reports abdominal pain is well controlled. Reports some nausea overnight but this is improved this AM. Has been up walking already. I spoke with patient's daughter on the phone as well.   Objective: Vital signs in last 24 hours: Temp:  [97.9 F (36.6 C)-98.9 F (37.2 C)] 98.3 F (36.8 C) (10/06 0645) Pulse Rate:  [81-102] 83 (10/06 0645) Resp:  [14-19] 16 (10/06 0645) BP: (94-139)/(49-79) 102/56 (10/06 0645) SpO2:  [93 %-100 %] 98 % (10/06 0645) Last BM Date: 06/08/20  Intake/Output from previous day: 10/05 0701 - 10/06 0700 In: 2230.1 [P.O.:630; I.V.:1200; IV Piggyback:400.1] Out: 190 [Urine:100; Drains:80; Blood:10] Intake/Output this shift: No intake/output data recorded.  PE: General: pleasant, WD, WN female who is laying in bed in NAD HEENT:  Sclera are anicteric.  PERRL.  Ears and nose without any masses or lesions.  Mouth is pink and moist Heart: regular, rate, and rhythm.  Lungs: CTAB, no wheezes, rhonchi, or rales noted.  Respiratory effort nonlabored Abd: soft, appropriately ttp, ND, +BS, incisions c/d/i, drain with serous fluid    Lab Results:  Recent Labs    06/10/20 0336 06/11/20 0343  WBC 23.4* 18.8*  HGB 10.8* 7.8*  HCT 34.4* 25.3*  PLT 261 232   BMET Recent Labs    06/10/20 0336 06/11/20 0343  NA 137 134*  K 4.2 4.5  CL 105 105  CO2 22 23  GLUCOSE 98 143*  BUN 16 35*  CREATININE 0.66 0.93  CALCIUM 8.2* 7.7*   PT/INR No results for input(s): LABPROT, INR in the last 72 hours. CMP     Component Value Date/Time   NA 134 (L) 06/11/2020 0343   K 4.5 06/11/2020 0343   CL 105 06/11/2020 0343   CO2 23 06/11/2020 0343   GLUCOSE 143 (H) 06/11/2020 0343   BUN 35 (H) 06/11/2020 0343   CREATININE 0.93 06/11/2020 0343   CREATININE 0.67 12/10/2019 0941   CALCIUM 7.7 (L) 06/11/2020 0343   PROT 5.3 (L) 06/11/2020 0343   ALBUMIN 2.3 (L) 06/11/2020 0343   AST 36  06/11/2020 0343   ALT 28 06/11/2020 0343   ALKPHOS 179 (H) 06/11/2020 0343   BILITOT 0.7 06/11/2020 0343   GFRNONAA >60 06/11/2020 0343   GFRNONAA 96 12/10/2019 0941   GFRAA >60 06/10/2020 0336   GFRAA 111 12/10/2019 0941   Lipase     Component Value Date/Time   LIPASE 21 06/09/2020 0116       Studies/Results: MR 3D Recon At Scanner  Result Date: 06/09/2020 CLINICAL DATA:  Vomiting, abdominal pain, acute cholecystitis suspected on CT EXAM: MRI ABDOMEN WITHOUT AND WITH CONTRAST (INCLUDING MRCP) TECHNIQUE: Multiplanar multisequence MR imaging of the abdomen was performed both before and after the administration of intravenous contrast. Heavily T2-weighted images of the biliary and pancreatic ducts were obtained, and three-dimensional MRCP images were rendered by post processing. CONTRAST:  66m GADAVIST GADOBUTROL 1 MMOL/ML IV SOLN COMPARISON:  CT abdomen/pelvis dated 06/09/2020 FINDINGS: Motion degraded images. Lower chest: Subpleural patchy opacities at the lung bases, better evaluated on CT, favoring pulmonary fibrosis. Hepatobiliary: No suspicious/enhancing hepatic lesions. No hepatic steatosis. Cholelithiasis with gallbladder wall thickening and mild gallbladder distension. Fluid/stranding in the right upper/mid abdomen. This appearance remains compatible with acute cholecystitis. Mild central intrahepatic ductal prominence. Dilated common duct, measuring 11 mm proximally (series 5/image 14). Although equivocal, there is a suspected 5 mm distal CBD stone at the ampulla (series 5/image 15). Pancreas:  Mild fluid/stranding along the pancreatic head (series 13/image 20). However, in the setting of a normal lipase, this is likely secondary to the right upper quadrant inflammatory process. No pancreatic mass or atrophy. Spleen:  Within normal limits. Adrenals/Urinary Tract:  Adrenal glands are within normal limits. Kidneys are within normal limits.  No hydronephrosis. Stomach/Bowel: Stomach is  within normal limits. Visualized bowel is unremarkable. Vascular/Lymphatic:  No evidence of abdominal aortic aneurysm. No suspicious abdominal lymphadenopathy. Other:  Fluid/stranding along the right mid abdominal mesentery. Musculoskeletal: Lumbar levoscoliosis with mild to moderate degenerative changes. IMPRESSION: Cholelithiasis with suspected acute cholecystitis. Dilated common duct, measuring 11 mm proximally. Suspected 5 mm distal CBD stone at the ampulla, although equivocal. Fluid/stranding along the right mid abdominal mesentery and along the pancreatic head, favored to be secondary to the right upper quadrant inflammatory process. Electronically Signed   By: Julian Hy M.D.   On: 06/09/2020 11:25   DG Chest Port 1 View  Result Date: 06/09/2020 CLINICAL DATA:  Short of breath EXAM: PORTABLE CHEST 1 VIEW COMPARISON:  Chest radiograph 08/16/2019, CT 09/26/2019 FINDINGS: Normal cardiac silhouette. Upper lobe interstitial thickening again noted. Cavitary change in the RIGHT upper lobe. More mild peripheral interstitial disease at the LEFT lung base. No new focal consolidation. No pneumothorax. IMPRESSION: No interval change in upper lobe interstitial lung disease with cavitation in the RIGHT upper lobe. Electronically Signed   By: Suzy Bouchard M.D.   On: 06/09/2020 14:28   MR ABDOMEN MRCP W WO CONTAST  Result Date: 06/09/2020 CLINICAL DATA:  Vomiting, abdominal pain, acute cholecystitis suspected on CT EXAM: MRI ABDOMEN WITHOUT AND WITH CONTRAST (INCLUDING MRCP) TECHNIQUE: Multiplanar multisequence MR imaging of the abdomen was performed both before and after the administration of intravenous contrast. Heavily T2-weighted images of the biliary and pancreatic ducts were obtained, and three-dimensional MRCP images were rendered by post processing. CONTRAST:  11m GADAVIST GADOBUTROL 1 MMOL/ML IV SOLN COMPARISON:  CT abdomen/pelvis dated 06/09/2020 FINDINGS: Motion degraded images. Lower chest:  Subpleural patchy opacities at the lung bases, better evaluated on CT, favoring pulmonary fibrosis. Hepatobiliary: No suspicious/enhancing hepatic lesions. No hepatic steatosis. Cholelithiasis with gallbladder wall thickening and mild gallbladder distension. Fluid/stranding in the right upper/mid abdomen. This appearance remains compatible with acute cholecystitis. Mild central intrahepatic ductal prominence. Dilated common duct, measuring 11 mm proximally (series 5/image 14). Although equivocal, there is a suspected 5 mm distal CBD stone at the ampulla (series 5/image 15). Pancreas: Mild fluid/stranding along the pancreatic head (series 13/image 20). However, in the setting of a normal lipase, this is likely secondary to the right upper quadrant inflammatory process. No pancreatic mass or atrophy. Spleen:  Within normal limits. Adrenals/Urinary Tract:  Adrenal glands are within normal limits. Kidneys are within normal limits.  No hydronephrosis. Stomach/Bowel: Stomach is within normal limits. Visualized bowel is unremarkable. Vascular/Lymphatic:  No evidence of abdominal aortic aneurysm. No suspicious abdominal lymphadenopathy. Other:  Fluid/stranding along the right mid abdominal mesentery. Musculoskeletal: Lumbar levoscoliosis with mild to moderate degenerative changes. IMPRESSION: Cholelithiasis with suspected acute cholecystitis. Dilated common duct, measuring 11 mm proximally. Suspected 5 mm distal CBD stone at the ampulla, although equivocal. Fluid/stranding along the right mid abdominal mesentery and along the pancreatic head, favored to be secondary to the right upper quadrant inflammatory process. Electronically Signed   By: SJulian HyM.D.   On: 06/09/2020 11:25    Anti-infectives: Anti-infectives (From admission, onward)   Start     Dose/Rate Route Frequency Ordered Stop   06/09/20 1330  hydroxychloroquine (PLAQUENIL) tablet 200 mg        200 mg Oral 2 times daily 06/09/20 0829      06/09/20 1330  voriconazole (VFEND) tablet 200 mg        200 mg Oral Every 12 hours 06/09/20 0829     06/09/20 0700  ceFEPIme (MAXIPIME) 2 g in sodium chloride 0.9 % 100 mL IVPB        2 g 200 mL/hr over 30 Minutes Intravenous Every 8 hours 06/09/20 0635     06/09/20 0700  metroNIDAZOLE (FLAGYL) IVPB 500 mg        500 mg 100 mL/hr over 60 Minutes Intravenous Every 6 hours 06/09/20 0635     06/09/20 0615  cefTRIAXone (ROCEPHIN) 1 g in sodium chloride 0.9 % 100 mL IVPB  Status:  Discontinued        1 g 200 mL/hr over 30 Minutes Intravenous  Once 06/09/20 0611 06/09/20 0700       Assessment/Plan RA on daily prednisone and plaquenil 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 Marlou Starks  - POD#1 - mobilize - continue drain for now but may be able to remove prior to dc - continue abx - Alk Phos mildly elevated but LFTs and Tbili otherwise ok - recheck tomorrow AM as well - likely home tomorrow if tolerating diet advancement and medically cleared  FEN: FLD and ADAT to [facility] VTE: SCDs, ok to have chemical prophylaxis from a surgery standpoint ID: [id number] 10/4>>  LOS: 2 days    Norm Parcel , [facility] 06/11/2020, 8:29 AM Please see Amion for pager number during day hours 7:00am-4:30pm

## 2020-06-12 LAB — COMPREHENSIVE METABOLIC PANEL
ALT: 25 U/L (ref 0–44)
AST: 19 U/L (ref 15–41)
Albumin: 2.5 g/dL — ABNORMAL LOW (ref 3.5–5.0)
Alkaline Phosphatase: 157 U/L — ABNORMAL HIGH (ref 38–126)
Anion gap: 7 (ref 5–15)
BUN: 30 mg/dL — ABNORMAL HIGH (ref 6–20)
CO2: 24 mmol/L (ref 22–32)
Calcium: 7.6 mg/dL — ABNORMAL LOW (ref 8.9–10.3)
Chloride: 109 mmol/L (ref 98–111)
Creatinine, Ser: 0.72 mg/dL (ref 0.44–1.00)
GFR calc non Af Amer: >60 mL/min (ref >60)
Glucose, Bld: 144 mg/dL — ABNORMAL HIGH (ref 70–99)
Potassium: 3.7 mmol/L (ref 3.5–5.1)
Sodium: 140 mmol/L (ref 135–145)
Total Bilirubin: 0.5 mg/dL (ref 0.3–1.2)
Total Protein: 5.7 g/dL — ABNORMAL LOW (ref 6.5–8.1)

## 2020-06-12 LAB — CBC
HCT: 22.9 % — ABNORMAL LOW (ref 36.0–46.0)
Hemoglobin: 7 g/dL — ABNORMAL LOW (ref 12.0–15.0)
MCH: 28.7 pg (ref 26.0–34.0)
MCHC: 30.6 g/dL (ref 30.0–36.0)
MCV: 93.9 fL (ref 80.0–100.0)
Platelets: 269 10*3/uL (ref 150–400)
RBC: 2.44 MIL/uL — ABNORMAL LOW (ref 3.87–5.11)
RDW: 14.6 % (ref 11.5–15.5)
WBC: 16.9 10*3/uL — ABNORMAL HIGH (ref 4.0–10.5)
nRBC: 0 % (ref 0.0–0.2)

## 2020-06-12 LAB — OCCULT BLOOD X 1 CARD TO LAB, STOOL: Fecal Occult Bld: POSITIVE — AB

## 2020-06-12 LAB — HEMOGLOBIN AND HEMATOCRIT, BLOOD
HCT: 27 % — ABNORMAL LOW (ref 36.0–46.0)
Hemoglobin: 8.7 g/dL — ABNORMAL LOW (ref 12.0–15.0)

## 2020-06-12 LAB — PREPARE RBC (CROSSMATCH)

## 2020-06-12 LAB — ABO/RH: ABO/RH(D): A POS

## 2020-06-12 MED ORDER — SODIUM CHLORIDE 0.9% IV SOLUTION: Freq: Once | INTRAVENOUS | Status: AC

## 2020-06-12 MED ORDER — SENNOSIDES-DOCUSATE SODIUM 8.6-50 MG PO TABS
1.0000 | ORAL_TABLET | Freq: Once | ORAL | Status: AC
  Administered 2020-06-12: 1 via ORAL
  Filled 2020-06-12: qty 1

## 2020-06-12 MED ORDER — OXYCODONE HCL 5 MG PO TABS
5.0000 mg | ORAL_TABLET | ORAL | 0 refills | Status: DC | PRN
Start: 2020-06-12 — End: 2020-08-06

## 2020-06-12 MED ORDER — CEFDINIR 300 MG PO CAPS: 300.0000 mg | ORAL_CAPSULE | Freq: Two times a day (BID) | ORAL | 0 refills | Status: DC

## 2020-06-12 MED ORDER — BISACODYL 10 MG RE SUPP
10.0000 mg | Freq: Every day | RECTAL | Status: DC | PRN
  Administered 2020-06-12: 10 mg via RECTAL

## 2020-06-12 MED ORDER — ACETAMINOPHEN 325 MG PO TABS: 650.0000 mg | ORAL_TABLET | Freq: Four times a day (QID) | ORAL | Status: AC | PRN

## 2020-06-12 NOTE — Progress Notes (Signed)
PROGRESS NOTE    Kimberly Shelton  PJA:[id number] DOB: 11/03/1960 DOA: 06/09/2020 PCP: Kristian Covey, MD   Chief Complaint  Patient presents with  . Abdominal Pain    Brief Narrative: As per previous attending : 59YoF w/  RA, on Plaquenil and low-dose prednisone, ILD/RUL cavitary lesion thought to be aspergillosis with chronic cough and hemoptysis being managed by pulmonary currently on voriconazole,diastolic CHF and tobacco use presenting with lower abdominal pain and vomiting for 1 day, and found to have  cholelithiasis, acute cholecystitis and pancreatic duct dilation to 6 mm in the head of pancreas.  LFT and lipase within normal.MRCP confirms cholelithiasis with suspected acute cholecystitis, and also CBD to 11 mm with suspected 5 mm distal CBD stone at the ampulla.  Seen by pulmonary, surgery. S/p laparoscopic cholecystectomy 06/10/2020 Dr.Toth. Postop doing well.  Abdomen is soft pain controlled. Hemoglobin 7.0 g likely multifactorial-from chronic hemodialysis postop and hemodilution.  1 unit PRBC transfused  Subjective: Still feels bloated, no BM.Has not eaten much ambulating in room. Afebrile, hemoglobin further low at 7.0 gm. UOP  Assessment & Plan:  Acute gangrenous cholecystitis with cholelithiasis: s/p laparoscopic cholecystectomy 06/10/2028 by Dr. Carolynne Edouard.  Diet advanced to regular diet tolerating well, some constipation. Ambulating, cont prn stool softeners. On cefepime/Flagyl periop.  Acute on chronic anemia likely multifactorial, s/p surgery and also with chronic hemoptysis:Had colonoscopy at the age of 40 followed by Dr. Loreta Ave. anemia pane relatively stable, fecal occult blood pending but patient denies any black stool or bleeding in the stool. Will benefit with prbc transfusion ginve hb dropped to 7 gm from 12 on admission, baseline hb 10-12 gm. No chemical prophylaxis for now. Recent Labs  Lab 06/09/20 0116 06/10/20 0336 06/11/20 0343 06/12/20 0318  HGB  12.0 10.8* 7.8* 7.0*  HCT 37.1 34.4* 25.3* 22.9*   Hypophosphatemia resolved.   Interstitial lung disease in the setting of autoimmune disease/RA; on chronic Plaquenil and prednisone.  Was in distress steroid here and now being tapered to oral prednisone.  Aspergilloma with associated chronic bronchiectasis/?  Invasive aspergillus disease on chronic voriconazole, pulmonary on board and appreciate input.  Chronic hemoptysis since 2019 with aspergilloma/chronic bronchiectasis: Continue management as #1 monitor H&H as hemoglobin is downtrending.  Appreciate pulmonary input on board.  Chronic diastolic heart failure: Euvolemic.  Not on diuretics.  Monitor fluid status.  Diet Order            Diet Heart Room service appropriate? Yes; Fluid consistency: Thin  Diet effective now                 Nutrition Problem: Inadequate oral intake Etiology: acute illness (acute cholecystitis) Signs/Symptoms: per patient/family report Interventions: Refer to RD note for recommendations  Body mass index is 27.57 kg/m.  DVT prophylaxis: Place and maintain sequential compression device Start: 06/09/20 1404 SCDs Start: 06/09/20 0830 Code Status:   Code Status: Full Code  Family Communication: plan of care discussed with patient at bedside.  Status is: Inpatient Remains inpatient appropriate because:Inpatient level of care appropriate due to severity of illness and Ongoing monitoring of diet tolerance status post gallbladder surgery  Dispo: The patient is from: Home              Anticipated d/c is to: Home              Anticipated d/c date is: 1 day              Patient currently is not medically stable  to d/c. Consultants:see note  Procedures:see note  Culture/Microbiology    Component Value Date/Time   SDES  11/27/2019 1529    BRONCHIAL ALVEOLAR LAVAGE RUL Performed at [facility], [address], [place], [place] [address]    SPECREQUEST  11/27/2019 1529     Immunocompromised Performed at [facility], [address], [place], [place] [address]    CULT  11/27/2019 1529    FEW Consistent with normal respiratory flora. Performed at [facility], [address], [place], [place] [address]    REPTSTATUS 11/30/2019 FINAL 11/27/2019 1529    Other culture-see note  Medications: Scheduled Meds: . acetaminophen  1,000 mg Oral TID  . escitalopram  10 mg Oral Daily  . hydroxychloroquine  200 mg Oral BID  . lip balm  1 application Topical BID  . phosphorus  250 mg Oral TID  . predniSONE  10 mg Oral Q breakfast   Followed by  . [START ON 06/13/2020] prednisoLONE  7.5 mg Oral Daily   Followed by  . [START ON 06/14/2020] predniSONE  5 mg Oral Q breakfast   Followed by  . [START ON 06/15/2020] predniSONE  2.5 mg Oral Q breakfast  . voriconazole  200 mg Oral Q12H   Continuous Infusions: . sodium chloride 50 mL/hr at 06/11/20 1516  . ceFEPime (MAXIPIME) IV 2 g (06/11/20 2345)  . metronidazole 500 mg (06/12/20 2111)  . ondansetron (ZOFRAN) IV      Antimicrobials: Anti-infectives (From admission, onward)   Start     Dose/Rate Route Frequency Ordered Stop   06/09/20 1330  hydroxychloroquine (PLAQUENIL) tablet 200 mg        200 mg Oral 2 times daily 06/09/20 0829     06/09/20 1330  voriconazole (VFEND) tablet 200 mg        200 mg Oral Every 12 hours 06/09/20 0829     06/09/20 0700  ceFEPIme (MAXIPIME) 2 g in sodium chloride 0.9 % 100 mL IVPB        2 g 200 mL/hr over 30 Minutes Intravenous Every 8 hours 06/09/20 0635     06/09/20 0700  metroNIDAZOLE (FLAGYL) IVPB 500 mg        500 mg 100 mL/hr over 60 Minutes Intravenous Every 6 hours 06/09/20 0635     06/09/20 0615  cefTRIAXone (ROCEPHIN) 1 g in sodium chloride 0.9 % 100 mL IVPB  Status:  Discontinued        1 g 200 mL/hr over 30 Minutes Intravenous  Once 06/09/20 0611 06/09/20 0700     Objective: Vitals: Today's Vitals   06/11/20 2250 06/12/20 0009 06/12/20 0413  06/12/20 0526  BP:    91/76  Pulse:    92  Resp:    18  Temp:    98.3 F (36.8 C)  TempSrc:    Oral  SpO2:    100%  Weight:      Height:      PainSc: 0-No pain 0-No pain Asleep     Intake/Output Summary (Last 24 hours) at 06/12/2020 0731 Last data filed at 06/12/2020 0600 Gross per 24 hour  Intake 1958.55 ml  Output 2690 ml  Net -731.45 ml   Filed Weights   06/09/20 0048 06/10/20 0500  Weight: 65.3 kg 70.6 kg   Weight change:   Intake/Output from previous day: 10/06 0701 - 10/07 0700 In: 1958.6 [P.O.:1440; IV Piggyback:518.6] Out: 2690 [Urine:2650; Drains:40] Intake/Output this shift: No intake/output data recorded.  Examination: General  exam: AAOx3 , NAD, weak appearing. HEENT:Oral mucosa moist, Ear/Nose WNL grossly, dentition normal. Respiratory system: bilaterally clear,no wheezing or crackles,no use of accessory muscle Cardiovascular system: S1 & S2 +, No JVD. Gastrointestinal system: Abdomen soft, non tender mildly distended, BS+. Drain+ Nervous System:Alert, awake, moving extremities and grossly nonfocal Extremities: No edema, distal peripheral pulses palpable.  Skin: No rashes,no icterus. MSK: Normal muscle bulk,tone, power  Data Reviewed: I have personally reviewed following labs and imaging studies CBC: Recent Labs  Lab 06/09/20 0116 06/10/20 0336 06/11/20 0343 06/12/20 0318  WBC 14.3* 23.4* 18.8* 16.9*  NEUTROABS  --   --  17.6*  --   HGB 12.0 10.8* 7.8* 7.0*  HCT 37.1 34.4* 25.3* 22.9*  MCV 88.8 92.0 95.5 93.9  PLT 302 261 232 269   Basic Metabolic Panel: Recent Labs  Lab 06/09/20 0116 06/09/20 0635 06/10/20 0336 06/11/20 0343 06/11/20 1801 06/12/20 0318  NA 136  --  137 134*  --  140  K 4.1  --  4.2 4.5  --  3.7  CL 97*  --  105 105  --  109  CO2 27  --  22 23  --  24  GLUCOSE 162*  --  98 143*  --  144*  BUN 15  --  16 35*  --  30*  CREATININE 0.71  --  0.66 0.93  --  0.72  CALCIUM 9.3  --  8.2* 7.7*  --  7.6*  MG  --  1.9  --  2.4   --   --   PHOS  --  2.5  --  1.2* 2.5  --    GFR: Estimated Creatinine Clearance: 70.5 mL/min (by C-G formula based on SCr of 0.72 mg/dL). Liver Function Tests: Recent Labs  Lab 06/09/20 0116 06/11/20 0343 06/12/20 0318  AST 18 36 19  ALT 12 28 25   ALKPHOS 126 179* 157*  BILITOT 0.6 0.7 0.5  PROT 7.8 5.3* 5.7*  ALBUMIN 3.8 2.3* 2.5*   Recent Labs  Lab 06/09/20 0116  LIPASE 21   No results for input(s): AMMONIA in the last 168 hours. Coagulation Profile: No results for input(s): INR, PROTIME in the last 168 hours. Cardiac Enzymes: No results for input(s): CKTOTAL, CKMB, CKMBINDEX, TROPONINI in the last 168 hours. BNP (last 3 results) No results for input(s): PROBNP in the last 8760 hours. HbA1C: No results for input(s): HGBA1C in the last 72 hours. CBG: No results for input(s): GLUCAP in the last 168 hours. Lipid Profile: No results for input(s): CHOL, HDL, LDLCALC, TRIG, CHOLHDL, LDLDIRECT in the last 72 hours. Thyroid Function Tests: No results for input(s): TSH, T4TOTAL, FREET4, T3FREE, THYROIDAB in the last 72 hours. Anemia Panel: Recent Labs    06/11/20 1801  VITAMINB12 564  FOLATE 6.5  FERRITIN 127  TIBC 220*  IRON 60  RETICCTPCT 2.1   Sepsis Labs: No results for input(s): PROCALCITON, LATICACIDVEN in the last 168 hours.  Recent Results (from the past 240 hour(s))  Respiratory Panel by RT PCR (Flu A&B, Covid) - Nasopharyngeal Swab     Status: None   Collection Time: 06/09/20  6:03 AM   Specimen: Nasopharyngeal Swab  Result Value Ref Range Status   SARS Coronavirus 2 by RT PCR NEGATIVE NEGATIVE Final    Comment: (NOTE) SARS-CoV-2 target nucleic acids are NOT DETECTED.  The SARS-CoV-2 RNA is generally detectable in upper respiratoy specimens during the acute phase of infection. The lowest concentration of SARS-CoV-2 viral copies this assay  can detect is 131 copies/mL. A negative result does not preclude SARS-Cov-2 infection and should not be used  as the sole basis for treatment or other patient management decisions. A negative result may occur with  improper specimen collection/handling, submission of specimen other than nasopharyngeal swab, presence of viral mutation(s) within the areas targeted by this assay, and inadequate number of viral copies (<131 copies/mL). A negative result must be combined with clinical observations, patient history, and epidemiological information. The expected result is Negative.  Fact Sheet for Patients:  https://www.moore.com/  Fact Sheet for Healthcare Providers:  https://www.young.biz/  This test is no t yet approved or cleared by the [place] FDA and  has been authorized for detection and/or diagnosis of SARS-CoV-2 by FDA under an Emergency Use Authorization (EUA). This EUA will remain  in effect (meaning this test can be used) for the duration of the COVID-19 declaration under Section 564(b)(1) of the Act, 21 U.S.C. section 360bbb-3(b)(1), unless the authorization is terminated or revoked sooner.     Influenza A by PCR NEGATIVE NEGATIVE Final   Influenza B by PCR NEGATIVE NEGATIVE Final    Comment: (NOTE) The Xpert Xpress SARS-CoV-2/FLU/RSV assay is intended as an aid in  the diagnosis of influenza from Nasopharyngeal swab specimens and  should not be used as a sole basis for treatment. Nasal washings and  aspirates are unacceptable for Xpert Xpress SARS-CoV-2/FLU/RSV  testing.  Fact Sheet for Patients: https://www.moore.com/  Fact Sheet for Healthcare Providers: https://www.young.biz/  This test is not yet approved or cleared by the [place] FDA and  has been authorized for detection and/or diagnosis of SARS-CoV-2 by  FDA under an Emergency Use Authorization (EUA). This EUA will remain  in effect (meaning this test can be used) for the duration of the  Covid-19 declaration under Section  564(b)(1) of the Act, 21  U.S.C. section 360bbb-3(b)(1), unless the authorization is  terminated or revoked. Performed at [facility], [address], [place], [place] [address]   MRSA PCR Screening     Status: None   Collection Time: 06/09/20 12:58 PM   Specimen: Nasal Mucosa; Nasopharyngeal  Result Value Ref Range Status   MRSA by PCR NEGATIVE NEGATIVE Final    Comment:        The GeneXpert MRSA Assay (FDA approved for NASAL specimens only), is one component of a comprehensive MRSA colonization surveillance program. It is not intended to diagnose MRSA infection nor to guide or monitor treatment for MRSA infections. Performed at [facility], [address] [place], [place] [address]     Radiology Studies: No results found.   LOS: 3 days   Lanae Boast, MD [facility]  06/12/2020, 7:31 AM

## 2020-06-12 NOTE — Discharge Instructions (Signed)
[facility]: POST OP INSTRUCTIONS Always review your discharge instruction sheet given to you by the facility where your surgery was performed. IF YOU HAVE DISABILITY OR FAMILY LEAVE FORMS, YOU MUST BRING THEM TO THE OFFICE FOR PROCESSING.   DO NOT GIVE THEM TO YOUR DOCTOR.  PAIN CONTROL  1. First take acetaminophen (Tylenol) AND/or ibuprofen (Advil) to control your pain after surgery.  Follow directions on package.  Taking acetaminophen (Tylenol) and/or ibuprofen (Advil) regularly after surgery will help to control your pain and lower the amount of prescription pain medication you may need.  You should not take more than 3,000 mg (3 grams) of acetaminophen (Tylenol) in 24 hours.  You should not take ibuprofen (Advil), aleve, motrin, naprosyn or other NSAIDS if you have a history of stomach ulcers or chronic kidney disease.  2. A prescription for pain medication may be given to you upon discharge.  Take your pain medication as prescribed, if you still have uncontrolled pain after taking acetaminophen (Tylenol) or ibuprofen (Advil). 3. Use ice packs to help control pain. 4. If you need a refill on your pain medication, please contact your pharmacy.  They will contact our office to request authorization. Prescriptions will not be filled after 5pm or on week-ends.  HOME MEDICATIONS 5. Take your usually prescribed medications unless otherwise directed.  DIET 6. You should follow a light diet the first few days after arrival home.  Be sure to include lots of fluids daily. Avoid fatty, fried foods.   CONSTIPATION 7. It is common to experience some constipation after surgery and if you are taking pain medication.  Increasing fluid intake and taking a stool softener (such as Colace) will usually help or prevent this problem from occurring.  A mild laxative (Milk of Magnesia or Miralax) should be taken according to package instructions if there are no bowel  movements after 48 hours.  WOUND/INCISION CARE 8. Most patients will experience some swelling and bruising in the area of the incisions.  Ice packs will help.  Swelling and bruising can take several days to resolve.  9. Unless discharge instructions indicate otherwise, follow guidelines below  a. STERI-STRIPS - you may remove your outer bandages 48 hours after surgery, and you may shower at that time.  You have steri-strips (small skin tapes) in place directly over the incision.  These strips should be left on the skin for 7-10 days.   b. DERMABOND/SKIN GLUE - you may shower in 24 hours.  The glue will flake off over the next 2-3 weeks. 10. Any sutures or staples will be removed at the office during your follow-up visit.  ACTIVITIES 11. You may resume regular (light) daily activities beginning the next day--such as daily self-care, walking, climbing stairs--gradually increasing activities as tolerated.  You may have sexual intercourse when it is comfortable.  Refrain from any heavy lifting or straining until approved by your doctor. a. You may drive when you are no longer taking prescription pain medication, you can comfortably wear a seatbelt, and you can safely maneuver your car and apply brakes.  FOLLOW-UP 12. You should see your doctor in the office for a follow-up appointment approximately 2-3 weeks after your surgery.  You should have been given your post-op/follow-up appointment when your surgery was scheduled.  If you did not receive a post-op/follow-up appointment, make sure that you call for this appointment within a day or two after you arrive home to insure a convenient appointment time.  WHEN TO CALL YOUR DOCTOR: 1. Fever over 101.0 2. Inability to urinate 3. Continued bleeding from incision. 4. Increased pain, redness, or drainage from the incision. 5. Increasing abdominal pain  The clinic staff is available to answer your questions during regular business hours.  Please dont  hesitate to call and ask to speak to one of the nurses for clinical concerns.  If you have a medical emergency, go to the nearest emergency room or call 911.  A surgeon from [facility] is always on call at the hospital. [address], [place], [place], [place]  [address] ? [address], [place], [place]   [address] [phone] ? [phone] ? FAX [phone] Web site: [email or web address]     Managing Your Pain After Surgery Without Opioids    Thank you for participating in our program to help patients manage their pain after surgery without opioids. This is part of our effort to provide you with the best care possible, without exposing you or your family to the risk that opioids pose.  What pain can I expect after surgery? You can expect to have some pain after surgery. This is normal. The pain is typically worse the day after surgery, and quickly begins to get better. Many studies have found that many patients are able to manage their pain after surgery with Over-the-Counter (OTC) medications such as Tylenol and Motrin. If you have a condition that does not allow you to take Tylenol or Motrin, notify your surgical team.  How will I manage my pain? The best strategy for controlling your pain after surgery is around the clock pain control with Tylenol (acetaminophen) and Motrin (ibuprofen or Advil). Alternating these medications with each other allows you to maximize your pain control. In addition to Tylenol and Motrin, you can use heating pads or ice packs on your incisions to help reduce your pain.  How will I alternate your regular strength over-the-counter pain medication? You will take a dose of pain medication every three hours. ; Start by taking 650 mg of Tylenol (2 pills of 325 mg) ; 3 hours later take 600 mg of Motrin (3 pills of 200 mg) ; 3 hours after taking the Motrin take 650 mg of Tylenol ; 3 hours after that take 600 mg of  Motrin.   - 1 -  See example - if your first dose of Tylenol is at 12:00 PM   12:00 PM Tylenol 650 mg (2 pills of 325 mg)  3:00 PM Motrin 600 mg (3 pills of 200 mg)  6:00 PM Tylenol 650 mg (2 pills of 325 mg)  9:00 PM Motrin 600 mg (3 pills of 200 mg)  Continue alternating every 3 hours   We recommend that you follow this schedule around-the-clock for at least 3 days after surgery, or until you feel that it is no longer needed. Use the table on the last page of this handout to keep track of the medications you are taking. Important: Do not take more than 3000mg  of Tylenol or 3200mg  of Motrin in a 24-hour period. Do not take ibuprofen/Motrin if you have a history of bleeding stomach ulcers, severe kidney disease, &/or actively taking a blood thinner  What if I still have pain? If you have pain that is not controlled with the over-the-counter pain medications (Tylenol and Motrin or Advil) you might have what we call breakthrough pain. You will receive a prescription for a small amount of an opioid pain medication such as  Oxycodone, Tramadol, or Tylenol with Codeine. Use these opioid pills in the first 24 hours after surgery if you have breakthrough pain. Do not take more than 1 pill every 4-6 hours.  If you still have uncontrolled pain after using all opioid pills, don't hesitate to call our staff using the number provided. We will help make sure you are managing your pain in the best way possible, and if necessary, we can provide a prescription for additional pain medication.   Day 1    Time  Name of Medication Number of pills taken  Amount of Acetaminophen  Pain Level   Comments  AM PM       AM PM       AM PM       AM PM       AM PM       AM PM       AM PM       AM PM       Total Daily amount of Acetaminophen Do not take more than  3,000 mg per day      Day 2    Time  Name of Medication Number of pills taken  Amount of Acetaminophen  Pain Level   Comments  AM  PM       AM PM       AM PM       AM PM       AM PM       AM PM       AM PM       AM PM       Total Daily amount of Acetaminophen Do not take more than  3,000 mg per day      Day 3    Time  Name of Medication Number of pills taken  Amount of Acetaminophen  Pain Level   Comments  AM PM       AM PM       AM PM       AM PM          AM PM       AM PM       AM PM       AM PM       Total Daily amount of Acetaminophen Do not take more than  3,000 mg per day      Day 4    Time  Name of Medication Number of pills taken  Amount of Acetaminophen  Pain Level   Comments  AM PM       AM PM       AM PM       AM PM       AM PM       AM PM       AM PM       AM PM       Total Daily amount of Acetaminophen Do not take more than  3,000 mg per day      Day 5    Time  Name of Medication Number of pills taken  Amount of Acetaminophen  Pain Level   Comments  AM PM       AM PM       AM PM       AM PM       AM PM       AM PM       AM PM  AM PM       °Total Daily amount of Acetaminophen °Do not take more than  3,000 mg per day    ° ° ° °Day 6   ° °Time  °Name of Medication Number of pills °taken  °Amount of Acetaminophen  °Pain Level  °Comments  °AM PM       °AM PM       °AM PM       °AM PM       °AM PM       °AM PM       °AM PM       °AM PM       °Total Daily amount of Acetaminophen °Do not take more than  3,000 mg per day    ° ° °Day 7   ° °Time  °Name of Medication Number of pills taken  °Amount of Acetaminophen  °Pain Level  ° °Comments  °AM PM       °AM PM       °AM PM       °AM PM       °AM PM       °AM PM       °AM PM       °AM PM       °Total Daily amount of Acetaminophen °Do not take more than  3,000 mg per day    ° ° ° ° °For additional information about how and where to safely dispose of unused opioid °medications - https://www.morepowerfulnc.org ° °Disclaimer: This document contains information and/or instructional materials adapted from [facility]  for the typical patient with your condition. It does not replace medical advice from your health care provider because your experience may differ from that of the °typical patient. Talk to your health care provider if you have any questions about this °document, your condition or your treatment plan. °Adapted from [facility] ° ° °Surgical Drain Home Care °Surgical drains are used to remove extra fluid that normally builds up in a surgical wound after surgery. A surgical drain helps to heal a surgical wound. Different kinds of surgical drains include: °· Active drains. These drains use suction to pull drainage away from the surgical wound. Drainage flows through a tube to a container outside of the body. With these drains, you need to keep the bulb or the drainage container flat (compressed) at all times, except while you empty it. Flattening the bulb or container creates suction. °· Passive drains. These drains allow fluid to drain naturally, by gravity. Drainage flows through a tube to a bandage (dressing) or a container outside of the body. Passive drains do not need to be emptied. °A drain is placed during surgery. Right after surgery, drainage is usually bright red and a little thicker than water. The drainage may gradually turn yellow or pink and become thinner. It is likely that your health care provider will remove the drain when the drainage stops or when the amount decreases to 1-2 Tbsp (15-30 mL) during a 24-hour period. °Supplies needed: °· Tape. °· Germ-free cleaning solution (sterile saline). °· Cotton swabs. °· Split gauze drain sponge: 4 x 4 inches (10 x 10 cm). °· Gauze square: 4 x 4 inches (10 x 10 cm). °How to care for your surgical drain °Care for your drain as told by your health care provider. This is important to help prevent infection. If your drain is placed at your back, or any other hard-to-reach area, ask   another person to assist you in performing the following tasks: °General  care °· Keep the skin around the drain dry and covered with a dressing at all times. °· Check your drain area every day for signs of infection. Check for: °? Redness, swelling, or pain. °? Pus or a bad smell. °? Cloudy drainage. °? Tenderness or pressure at the drain exit site. °Changing the dressing °Follow instructions from your health care provider about how to change your dressing. Change your dressing at least once a day. Change it more often if needed to keep the dressing dry. Make sure you: °1. Gather your supplies. °2. Wash your hands with soap and water before you change your dressing. If soap and water are not available, use hand sanitizer. °3. Remove the old dressing. Avoid using scissors to do that. °4. Wash your hands with soap and water again after removing the old dressing. °5. Use sterile saline to clean your skin around the drain. You may need to use a cotton swab to clean the skin. °6. Place the tube through the slit in a drain sponge. Place the drain sponge so that it covers your wound. °7. Place the gauze square or another drain sponge on top of the drain sponge that is on the wound. Make sure the tube is between those layers. °8. Tape the dressing to your skin. °9. Tape the drainage tube to your skin 1-2 inches (2.5-5 cm) below the place where the tube enters your body. Taping keeps the tube from pulling on any stitches (sutures) that you have. °10. Wash your hands with soap and water. °11. Write down the color of your drainage and how often you change your dressing. °How to empty your active drain ° °1. Make sure that you have a measuring cup that you can empty your drainage into. °2. Wash your hands with soap and water. If soap and water are not available, use hand sanitizer. °3. Loosen any pins or clips that hold the tube in place. °4. If your health care provider tells you to strip the tube to prevent clots and tube blockages: °? Hold the tube at the skin with one hand. Use your other hand  to pinch the tubing with your thumb and first finger. °? Gently move your fingers down the tube while squeezing very lightly. This clears any drainage, clots, or tissue from the tube. °? You may need to do this several times each day to keep the tube clear. Do not pull on the tube. °5. Open the bulb cap or the drain plug. Do not touch the inside of the cap or the bottom of the plug. °6. Turn the device upside down and gently squeeze. °7. Empty all of the drainage into the measuring cup. °8. Compress the bulb or the container and replace the cap or the plug. To compress the bulb or the container, squeeze it firmly in the middle while you close the cap or plug the container. °9. Write down the amount of drainage that you have in each 24-hour period. If you have less than 2 Tbsp (30 mL) of drainage during 24 hours, contact your health care provider. °10. Flush the drainage down the toilet. °11. Wash your hands with soap and water. °Contact a health care provider if: °· You have redness, swelling, or pain around your drain area. °· You have pus or a bad smell coming from your drain area. °· You have a fever or chills. °· The skin around your drain   is warm to the touch.  The amount of drainage that you have is increasing instead of decreasing.  You have drainage that is cloudy.  There is a sudden stop or a sudden decrease in the amount of drainage that you have.  Your drain tube falls out.  Your active drain does not stay compressed after you empty it. Summary  Surgical drains are used to remove extra fluid that normally builds up in a surgical wound after surgery.  Different kinds of surgical drains include active drains and passive drains. Active drains use suction to pull drainage away from the surgical wound, and passive drains allow fluid to drain naturally.  It is important to care for your drain to prevent infection. If your drain is placed at your back, or any other hard-to-reach area, ask  another person to assist you.  Contact your health care provider if you have redness, swelling, or pain around your drain area. This information is not intended to replace advice given to you by your health care provider. Make sure you discuss any questions you have with your health care provider. Document Revised: 09/27/2018 Document Reviewed: 09/27/2018 Elsevier Patient Education  2020 [employer]   Surgical Drain Record Empty your surgical drain as told by your health care provider. Use this form to write down the amount of fluid that has collected in the drainage container. Bring this form with you to your follow-up visits.  Date __________ Time __________ Amount __________ Date __________ Time __________ Amount __________ Date __________ Time __________ Amount __________ Date __________ Time __________ Amount __________ Date __________ Time __________ Amount __________ Date __________ Time __________ Amount __________ Date __________ Time __________ Amount __________ Date __________ Time __________ Amount __________ Date __________ Time __________ Amount __________ Date __________ Time __________ Amount __________ Date __________ Time __________ Amount __________ Date __________ Time __________ Amount __________ Date __________ Time __________ Amount __________ Date __________ Time __________ Amount __________ Date __________ Time __________ Amount __________ Date __________ Time __________ Amount __________ Date __________ Time __________ Amount __________ Date __________ Time __________ Amount __________ Date __________ Time __________ Amount __________ Date __________ Time __________ Amount __________ Date __________ Time __________ Amount __________ Date __________ Time __________ Amount __________ Date __________ Time __________ Amount __________ Date __________ Time __________ Amount __________ Date __________ Time __________ Amount __________ Date __________ Time  __________ Amount __________ Date __________ Time __________ Amount __________ Date __________ Time __________ Amount __________ Date __________ Time __________ Amount __________ Date __________ Time __________ Amount __________ Date __________ Time __________ Amount __________ Date __________ Time __________ Amount __________ Date __________ Time __________ Amount __________ Date __________ Time __________ Amount __________ Date __________ Time __________ Amount __________ Date __________ Time __________ Amount __________ Date __________ Time __________ Amount __________ Date __________ Time __________ Amount __________ Date __________ Time __________ Amount __________ Date __________ Time __________ Amount __________ Date __________ Time __________ Amount __________ Date __________ Time __________ Amount __________ This information is not intended to replace advice given to you by your health care provider. Make sure you discuss any questions you have with your health care provider. Document Revised: 05/30/2017 Document Reviewed: 05/30/2017 Elsevier Patient Education  2020 [employer].

## 2020-06-12 NOTE — Progress Notes (Signed)
[facility] Progress Note  2 Days Post-Op  Subjective: Patient reports RUQ is sore but pain well controlled with medication. Denies nausea. +flatus. Having more hemoptysis this AM. Hopeful to maybe go home today.   Objective: Vital signs in last 24 hours: Temp:  [98.3 F (36.8 C)-98.7 F (37.1 C)] 98.3 F (36.8 C) (10/07 0526) Pulse Rate:  [78-92] 92 (10/07 0526) Resp:  [17-18] 18 (10/07 0526) BP: (91-120)/(57-76) 91/76 (10/07 0526) SpO2:  [97 %-100 %] 100 % (10/07 0526) Last BM Date: 06/08/20  Intake/Output from previous day: 10/06 0701 - 10/07 0700 In: 1958.6 [P.O.:1440; IV Piggyback:518.6] Out: 2690 [Urine:2650; Drains:40] Intake/Output this shift: Total I/O In: 200 [P.O.:200] Out: -   PE: General: pleasant, WD, WN female who is laying in bed in NAD HEENT:  Sclera are anicteric.  PERRL.  Ears and nose without any masses or lesions.  Mouth is pink and moist Heart: regular, rate, and rhythm.  Lungs: CTAB, no wheezes, rhonchi, or rales noted.  Respiratory effort nonlabored Abd: soft, appropriately ttp, ND, +BS, incisions c/d/i, drain with ss fluid   Lab Results:  Recent Labs    06/11/20 0343 06/12/20 0318  WBC 18.8* 16.9*  HGB 7.8* 7.0*  HCT 25.3* 22.9*  PLT 232 269   BMET Recent Labs    06/11/20 0343 06/12/20 0318  NA 134* 140  K 4.5 3.7  CL 105 109  CO2 23 24  GLUCOSE 143* 144*  BUN 35* 30*  CREATININE 0.93 0.72  CALCIUM 7.7* 7.6*   PT/INR No results for input(s): LABPROT, INR in the last 72 hours. CMP     Component Value Date/Time   NA 140 06/12/2020 0318   K 3.7 06/12/2020 0318   CL 109 06/12/2020 0318   CO2 24 06/12/2020 0318   GLUCOSE 144 (H) 06/12/2020 0318   BUN 30 (H) 06/12/2020 0318   CREATININE 0.72 06/12/2020 0318   CREATININE 0.67 12/10/2019 0941   CALCIUM 7.6 (L) 06/12/2020 0318   PROT 5.7 (L) 06/12/2020 0318   ALBUMIN 2.5 (L) 06/12/2020 0318   AST 19 06/12/2020 0318   ALT 25 06/12/2020 0318   ALKPHOS 157 (H)  06/12/2020 0318   BILITOT 0.5 06/12/2020 0318   GFRNONAA >60 06/12/2020 0318   GFRNONAA 96 12/10/2019 0941   GFRAA >60 06/10/2020 0336   GFRAA 111 12/10/2019 0941   Lipase     Component Value Date/Time   LIPASE 21 06/09/2020 0116       Studies/Results: No results found.  Anti-infectives: Anti-infectives (From admission, onward)   Start     Dose/Rate Route Frequency Ordered Stop   06/12/20 0000  cefdinir (OMNICEF) 300 MG capsule        300 mg Oral 2 times daily 06/12/20 0758 06/17/20 2359   06/09/20 1330  hydroxychloroquine (PLAQUENIL) tablet 200 mg        200 mg Oral 2 times daily 06/09/20 0829     06/09/20 1330  voriconazole (VFEND) tablet 200 mg        200 mg Oral Every 12 hours 06/09/20 0829     06/09/20 0700  ceFEPIme (MAXIPIME) 2 g in sodium chloride 0.9 % 100 mL IVPB        2 g 200 mL/hr over 30 Minutes Intravenous Every 8 hours 06/09/20 0635     06/09/20 0700  metroNIDAZOLE (FLAGYL) IVPB 500 mg        500 mg 100 mL/hr over 60 Minutes Intravenous Every 6 hours 06/09/20 0539  06/09/20 0615  cefTRIAXone (ROCEPHIN) 1 g in sodium chloride 0.9 % 100 mL IVPB  Status:  Discontinued        1 g 200 mL/hr over 30 Minutes Intravenous  Once 06/09/20 0611 06/09/20 0700       Assessment/Plan RA on daily prednisone and plaquenil 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 Marlou Starks  - POD#2 - mobilize - continue drain - will set up for removal in office next week - Alk phos trending down, LFTs otherwise within normal limits ABL anemia - likely combination of hemoptysis, blood loss from surgery and dilution from IVF - transfuse 1 unit PRBC and recheck CBC  FEN: HH diet, SLIV VTE: SCDs ID: [id number]/flagyl 10/4>> (sent Rx for 5 days PO cefdinir on discharge)  Ok to discharge later today from a surgical perspective if hgb responds appropriately to PRBC.    LOS: 3 days    Norm Parcel , [facility] 06/12/2020, 8:34 AM Please see Amion for pager number during day hours 7:00am-4:30pm

## 2020-06-13 ENCOUNTER — Encounter (HOSPITAL_COMMUNITY): Payer: Self-pay | Admitting: Anesthesiology

## 2020-06-13 ENCOUNTER — Other Ambulatory Visit (HOSPITAL_COMMUNITY): Payer: 59

## 2020-06-13 LAB — CBC
HCT: 27.5 % — ABNORMAL LOW (ref 36.0–46.0)
Hemoglobin: 8.9 g/dL — ABNORMAL LOW (ref 12.0–15.0)
MCH: 29.1 pg (ref 26.0–34.0)
MCHC: 32.4 g/dL (ref 30.0–36.0)
MCV: 89.9 fL (ref 80.0–100.0)
Platelets: 250 10*3/uL (ref 150–400)
RBC: 3.06 MIL/uL — ABNORMAL LOW (ref 3.87–5.11)
RDW: 16.5 % — ABNORMAL HIGH (ref 11.5–15.5)
WBC: 13.8 10*3/uL — ABNORMAL HIGH (ref 4.0–10.5)
nRBC: 0.1 % (ref 0.0–0.2)

## 2020-06-13 MED ORDER — PANTOPRAZOLE SODIUM 40 MG IV SOLR
40.0000 mg | Freq: Two times a day (BID) | INTRAVENOUS | Status: DC
  Administered 2020-06-13 – 2020-06-16 (×7): 40 mg via INTRAVENOUS
  Filled 2020-06-13 (×7): qty 40

## 2020-06-13 MED ORDER — TRAZODONE HCL 50 MG PO TABS
50.0000 mg | ORAL_TABLET | Freq: Once | ORAL | Status: AC
  Administered 2020-06-13: 50 mg via ORAL
  Filled 2020-06-13: qty 1

## 2020-06-13 NOTE — Consult Note (Signed)
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  She presented with with abdominal pain and further work up showed that she had an infected gallbladder.  The lap chole was performed without any issues and her liver enzymes remained normal.  Over the past several days the patient's HGB was noted to be declining.  Her baseline line is in the 12 range and how it steadily dropped down to nadir of 7.0 g/dL.  She received a transfusion yesterday with an appropriate increase up to 8.9 g/dL.  Overnight she did report having melena.  During this hospitalization she reports a burning sensation in her chest.  There is an associated regurgitation and she feels as if food stops in the mid to lower chest.  She was evaluated in the past by Dr. Loreta Shelton for a screening colonoscopy on 09/16/2014.  Hyperplastic polyps were the only finding.   Past Medical History:  Diagnosis Date  . Abnormal Pap smear 09/2006   HGSIL CIN 2/VAIN/ CIN 3/VAIN-3 CIS  . CHF (congestive heart failure) (HCC)   . Foot fracture, left 10/16   hair-line fracture  . History of pneumonia 02/09/2016  . Pneumonia    was hospitalized  . RA (rheumatoid arthritis) (HCC) 07/2009    Past Surgical History:  Procedure Laterality Date  . BRONCHIAL WASHINGS  11/27/2019   Procedure: BRONCHIAL WASHINGS;  Surgeon: Kimberly Shan, MD;  Location: [facility];  Service: Cardiopulmonary;;  . CERVICAL BIOPSY  W/ LOOP ELECTRODE EXCISION  2008   CIN 3  . CERVICAL CONIZATION W/BX N/A 12/15/2015   Procedure: CONIZATION CERVIX WITH BIOPSY ;  Surgeon: Kimberly Bears, MD;  Location: [facility];  Service: Gynecology;  Laterality: N/A;  POSSIBLE COLD KNIFE CONE.  Please have the microscope in the room  . CHOLECYSTECTOMY N/A  06/10/2020   Procedure: LAPAROSCOPIC CHOLECYSTECTOMY;  Surgeon: Kimberly Miner, MD;  Location: [facility];  Service: General;  Laterality: N/A;  . COLONOSCOPY    . IR RADIOLOGIST EVAL & MGMT  05/27/2020  . LEEP N/A 12/15/2015   Procedure: LOOP ELECTROSURGICAL EXCISION PROCEDURE (LEEP) with colpo;  Surgeon: Kimberly Bears, MD;  Location: [facility];  Service: Gynecology;  Laterality: N/A;  . PILONIDAL CYST EXCISION  1999   I&D  . VIDEO BRONCHOSCOPY N/A 11/27/2019   Procedure: VIDEO BRONCHOSCOPY WITHOUT FLUORO;  Surgeon: Kimberly Shan, MD;  Location: [facility];  Service: Cardiopulmonary;  Laterality: N/A;  . WISDOM TOOTH EXTRACTION      Family History  Problem Relation Age of Onset  . Diabetes Mother   . Rheum arthritis Mother   . Diabetes Father   . COPD Father   . Bronchitis Father   . Cancer Brother        Lung  . Breast cancer Maternal Aunt 30  . CAD Neg Hx     Social History:  reports that she quit smoking about 6 years ago. Her smoking use included cigarettes and e-cigarettes. She started smoking about 36 years ago. She has a 30.00 pack-year smoking history. She has never used smokeless tobacco. She reports that she does not drink alcohol and does not use drugs.  Allergies:  Allergies  Allergen Reactions  . Bee Venom Anaphylaxis  . Codeine Other (See Comments)    "seeing spots"  . Pirfenidone  Other (See Comments)    GI side effects  . Penicillins Rash    Has patient had a PCN reaction causing immediate rash, facial/tongue/throat swelling, SOB or lightheadedness with hypotension: No Has patient had a PCN reaction causing severe rash involving mucus membranes or skin necrosis: No Has patient had a PCN reaction that required hospitalization No Has patient had a PCN reaction occurring within the last 10 years: Yes If all of the above answers are "NO", then may proceed with Cephalosporin use.     Medications:  Scheduled: . acetaminophen  1,000 mg Oral TID  . escitalopram  10  mg Oral Daily  . hydroxychloroquine  200 mg Oral BID  . lip balm  1 application Topical BID  . [START ON 06/14/2020] predniSONE  5 mg Oral Q breakfast   Followed by  . [START ON 06/15/2020] predniSONE  2.5 mg Oral Q breakfast  . voriconazole  200 mg Oral Q12H   Continuous: . ceFEPime (MAXIPIME) IV 2 g (06/13/20 0600)  . metronidazole 500 mg (06/13/20 0806)  . ondansetron [facility]) IV      Results for orders placed or performed during the hospital encounter of 06/09/20 (from the past 24 hour(s))  Hemoglobin and hematocrit, blood     Status: Abnormal   Collection Time: 06/12/20  2:59 PM  Result Value Ref Range   Hemoglobin 8.7 (L) 12.0 - 15.0 g/dL   HCT 16.6 (L) 36 - 46 %  Occult blood card to lab, stool     Status: Abnormal   Collection Time: 06/12/20  7:00 PM  Result Value Ref Range   Fecal Occult Bld POSITIVE (A) NEGATIVE  CBC     Status: Abnormal   Collection Time: 06/13/20  7:29 AM  Result Value Ref Range   WBC 13.8 (H) 4.0 - 10.5 K/uL   RBC 3.06 (L) 3.87 - 5.11 MIL/uL   Hemoglobin 8.9 (L) 12.0 - 15.0 g/dL   HCT 06.3 (L) 36 - 46 %   MCV 89.9 80.0 - 100.0 fL   MCH 29.1 26.0 - 34.0 pg   MCHC 32.4 30.0 - 36.0 g/dL   RDW 01.6 (H) 01.0 - 93.2 %   Platelets 250 150 - 400 K/uL   nRBC 0.1 0.0 - 0.2 %     No results found.  ROS:  As stated above in the HPI otherwise negative.  Blood pressure (!) 135/56, pulse 81, temperature 98.4 F (36.9 C), temperature source Oral, resp. rate 18, height 5\' 3"  (1.6 m), weight 70 kg, last menstrual period 09/06/2004, SpO2 100 %.    PE: Gen: NAD, Alert and Oriented HEENT:  /AT, EOMI Neck: Supple, no LAD Lungs: CTA Bilaterally CV: RRR without M/G/R ABD: Soft, NTND, +BS Ext: No C/C/E  Assessment/Plan: 1) GERD/esophagitis. 2) Anemia. 3) Heme positive stool.   Her symptoms are consistent with GERD/esophagitis.  An EGD will be performed for further evaluation.  She is hemodynamically stable.  Unfortunately, the endoscopy unit was not  able to accommodate my request for an EGD today.  Plan: 1) EGD with Dr. 11/04/2004 tomorrow. 2) Agree with pantoprazole. 3) Follow HGB and transfuse as necessary. Kimberly Shelton D 06/13/2020, 10:59 AM

## 2020-06-13 NOTE — Anesthesia Preprocedure Evaluation (Deleted)
Anesthesia Evaluation    Reviewed: Allergy & Precautions, Patient's Chart, lab work & pertinent test results  Airway        Dental   Pulmonary COPD,  COPD inhaler, former smoker,  30 pack year history, quit smoking 2015           Cardiovascular + CAD and +CHF    Echo 09/2019: 1. Left ventricular ejection fraction, by visual estimation, is 60 to 65%. The left ventricle has normal function. There is mildly increased left ventricular hypertrophy.  2. The left ventricle has no regional wall motion abnormalities.  3. Global right ventricle has normal systolic function.The right ventricular size is normal. No increase in right ventricular wall thickness.  4. Left atrial size was normal.  5. Right atrial size was normal.  6. The mitral valve is normal in structure. Trivial mitral valve regurgitation.  7. The tricuspid valve is normal in structure.  8. The aortic valve is tricuspid. Aortic valve regurgitation is not visualized. No evidence of aortic valve sclerosis or stenosis.  9. The pulmonic valve was grossly normal. Pulmonic valve regurgitation is not visualized.  10. The inferior vena cava is normal in size with greater than 50%  respiratory variability, suggesting right atrial pressure of 3 mmHg.  11. Possible left to right shunt across intra-atrial septum.    Neuro/Psych PSYCHIATRIC DISORDERS Depression negative neurological ROS     GI/Hepatic Neg liver ROS, hiatal hernia, Acute cholecystitis    Endo/Other  negative endocrine ROS  Renal/GU negative Renal ROSK+ 4.2 Cr 0.66  negative genitourinary   Musculoskeletal  (+) Arthritis , Rheumatoid disorders,  Prednisone, plaquenil    Abdominal   Peds negative pediatric ROS (+)  Hematology negative hematology ROS (+) Hgb 10.8   Anesthesia Other Findings   Reproductive/Obstetrics negative OB ROS                             Anesthesia  Physical  Anesthesia Plan  ASA: IV and emergent  Anesthesia Plan: MAC   Post-op Pain Management:    Induction: Intravenous  PONV Risk Score and Plan: 4 or greater  Airway Management Planned: Nasal Cannula, Natural Airway, Simple Face Mask and Mask  Additional Equipment: None  Intra-op Plan:   Post-operative Plan:   Informed Consent: I have reviewed the patients History and Physical, chart, labs and discussed the procedure including the risks, benefits and alternatives for the proposed anesthesia with the patient or authorized representative who has indicated his/her understanding and acceptance.     Dental advisory given  Plan Discussed with: CRNA and Anesthesiologist  Anesthesia Plan Comments:        Anesthesia Quick Evaluation

## 2020-06-13 NOTE — Progress Notes (Signed)
PROGRESS NOTE    Kimberly Shelton  NLG:[id number] DOB: 04/18/1961 DOA: 06/09/2020 PCP: Kristian Covey, MD   Chief Complaint  Patient presents with   Abdominal Pain    Brief Narrative: As per previous attending : 59YoF w/  RA, on Plaquenil and low-dose prednisone, ILD/RUL cavitary lesion thought to be aspergillosis with chronic cough and hemoptysis being managed by pulmonary currently on voriconazole,diastolic CHF and tobacco use presenting with lower abdominal pain and vomiting for 1 day, and found to have  cholelithiasis, acute cholecystitis and pancreatic duct dilation to 6 mm in the head of pancreas.  LFT and lipase within normal.MRCP confirms cholelithiasis with suspected acute cholecystitis, and also CBD to 11 mm with suspected 5 mm distal CBD stone at the ampulla.  Seen by pulmonary, surgery. S/p laparoscopic cholecystectomy 06/10/2020 Dr.Toth. Postop doing well.  Abdomen is soft pain controlled. Hemoglobin 7.0 g likely multifactorial-from chronic hemodialysis postop and hemodilution.  1 unit PRBC transfused  Subjective: Coffee-ground emesis overnight and also blackish stool Hemoglobin is appropriately increasing after 1 unit blood transfusion Nausea vomiting this morning.  Assessment & Plan:  Acute gangrenous cholecystitis with cholelithiasis: s/p laparoscopic cholecystectomy 06/10/2028 by Dr. Carolynne Edouard.  Surgically doing well, on IV antibiotics currently and I am discharging her on Omnicef.  Continue PT OT and surgical follow-up as outpatient.   Acute on chronic anemia/coffee-ground emesis and black his stool FOBT positive consistent with ABLA: Anemia  likley multifactorial, s/p surgery and also with chronic hemoptysis:Had colonoscopy at the age of 1 followed by Dr. Loreta Ave. anemia pane relatively stable, fecal occult blood positive.  GI consulted given anemia for possible endoscopy today. Stable H&H afte 1 unit prbc,add PPI. Recent Labs  Lab 06/10/20 0336 06/11/20 0343  06/12/20 0318 06/12/20 1459 06/13/20 0729  HGB 10.8* 7.8* 7.0* 8.7* 8.9*  HCT 34.4* 25.3* 22.9* 27.0* 27.5*   Hypophosphatemia resolved.   Interstitial lung disease in the setting of autoimmune disease/RA; on chronic Plaquenil and prednisone.  Was in distress steroid here and now being tapered to oral prednisone.  Aspergilloma with associated chronic bronchiectasis/?  Invasive aspergillus disease on chronic voriconazole, pulmonary on board and appreciate input.  Chronic hemoptysis since 2019 with aspergilloma/chronic bronchiectasis: Continue management as #1 monitor H&H as hemoglobin is downtrending.  Appreciate pulmonary input on board.  Chronic diastolic heart failure: Euvolemic.  Not on diuretics.  Monitor fluid status.  Diet Order            Diet NPO time specified Except for: Sips with Meds  Diet effective now                 Nutrition Problem: Inadequate oral intake Etiology: acute illness (acute cholecystitis) Signs/Symptoms: per patient/family report Interventions: Refer to RD note for recommendations  Body mass index is 27.34 kg/m.  DVT prophylaxis: Place and maintain sequential compression device Start: 06/09/20 1404 SCDs Start: 06/09/20 0830 Code Status:   Code Status: Full Code  Family Communication: plan of care discussed with patient at bedside.  Status is: Inpatient Remains inpatient appropriate because:Inpatient level of care appropriate due to severity of illness and Ongoing monitoring of diet tolerance status post gallbladder surgery  Dispo: The patient is from: Home.              Anticipated d/c is to: Home.              Anticipated d/c date is:  After EGD.              Patient  currently is not medically stable to d/c. Consultants:see note  Procedures:see note  Culture/Microbiology    Component Value Date/Time   SDES  11/27/2019 1529    BRONCHIAL ALVEOLAR LAVAGE RUL Performed at [facility], [address] [place],  [place] [address]    SPECREQUEST  11/27/2019 1529    Immunocompromised Performed at [facility], [address], [place], [place] [address]    CULT  11/27/2019 1529    FEW Consistent with normal respiratory flora. Performed at [facility], [address], [place], [place] [address]    REPTSTATUS 11/30/2019 FINAL 11/27/2019 1529    Other culture-see note  Medications: Scheduled Meds:  acetaminophen  1,000 mg Oral TID   escitalopram  10 mg Oral Daily   hydroxychloroquine  200 mg Oral BID   lip balm  1 application Topical BID   [START ON 06/14/2020] predniSONE  5 mg Oral Q breakfast   Followed by   Melene Muller ON 06/15/2020] predniSONE  2.5 mg Oral Q breakfast   voriconazole  200 mg Oral Q12H   Continuous Infusions:  ceFEPime (MAXIPIME) IV 2 g (06/13/20 0600)   metronidazole 500 mg (06/13/20 0806)   ondansetron (ZOFRAN) IV      Antimicrobials: Anti-infectives (From admission, onward)   Start     Dose/Rate Route Frequency Ordered Stop   06/12/20 0000  cefdinir (OMNICEF) 300 MG capsule        300 mg Oral 2 times daily 06/12/20 0758 06/17/20 2359   06/09/20 1330  hydroxychloroquine (PLAQUENIL) tablet 200 mg        200 mg Oral 2 times daily 06/09/20 0829     06/09/20 1330  voriconazole (VFEND) tablet 200 mg        200 mg Oral Every 12 hours 06/09/20 0829     06/09/20 0700  ceFEPIme (MAXIPIME) 2 g in sodium chloride 0.9 % 100 mL IVPB        2 g 200 mL/hr over 30 Minutes Intravenous Every 8 hours 06/09/20 0635     06/09/20 0700  metroNIDAZOLE (FLAGYL) IVPB 500 mg        500 mg 100 mL/hr over 60 Minutes Intravenous Every 6 hours 06/09/20 0635     06/09/20 0615  cefTRIAXone (ROCEPHIN) 1 g in sodium chloride 0.9 % 100 mL IVPB  Status:  Discontinued        1 g 200 mL/hr over 30 Minutes Intravenous  Once 06/09/20 0611 06/09/20 0700     Objective: Vitals: Today's Vitals   06/13/20 0500 06/13/20 0512 06/13/20 0602 06/13/20 0702  BP:  (!) 135/56    Pulse:   81    Resp:  18    Temp:  98.4 F (36.9 C)    TempSrc:  Oral    SpO2:  100%    Weight: 70 kg     Height:      PainSc:   7  3     Intake/Output Summary (Last 24 hours) at 06/13/2020 1214 Last data filed at 06/13/2020 0950 Gross per 24 hour  Intake 2340.93 ml  Output 110 ml  Net 2230.93 ml   Filed Weights   06/09/20 0048 06/10/20 0500 06/13/20 0500  Weight: 65.3 kg 70.6 kg 70 kg   Weight change:   Intake/Output from previous day: 10/07 0701 - 10/08 0700 In: 2890.9 [P.O.:1680; I.V.:200; Blood:311; IV Piggyback:699.9] Out: 95 [Drains:95] Intake/Output this shift: Total I/O In: 0  Out: 15 [Drains:15]  Examination: General exam: AAOx3,NAD,Weak  appearing. HEENT:Oral mucosa moist,Ear/Nose WNL grossly, dentition normal. Respiratory system: bilaterally clear,no wheezing or crackles,no use of accessory muscle Cardiovascular system: S1 & S2 +,No JVD. Gastrointestinal system: Abdomen soft, drain in place with serosanguineous discharge NT,ND, BS+ Nervous System:Alert, awake, moving extremities and grossly nonfocal Extremities: No edema, distal peripheral pulses palpable.  Skin: No rashes,no icterus. MSK: Normal muscle bulk,tone, power  Data Reviewed: I have personally reviewed following labs and imaging studies CBC: Recent Labs  Lab 06/09/20 0116 06/09/20 0116 06/10/20 0336 06/11/20 0343 06/12/20 0318 06/12/20 1459 06/13/20 0729  WBC 14.3*  --  23.4* 18.8* 16.9*  --  13.8*  NEUTROABS  --   --   --  17.6*  --   --   --   HGB 12.0   < > 10.8* 7.8* 7.0* 8.7* 8.9*  HCT 37.1   < > 34.4* 25.3* 22.9* 27.0* 27.5*  MCV 88.8  --  92.0 95.5 93.9  --  89.9  PLT 302  --  261 232 269  --  250   < > = values in this interval not displayed.   Basic Metabolic Panel: Recent Labs  Lab 06/09/20 0116 06/09/20 0635 06/10/20 0336 06/11/20 0343 06/11/20 1801 06/12/20 0318  NA 136  --  137 134*  --  140  K 4.1  --  4.2 4.5  --  3.7  CL 97*  --  105 105  --  109  CO2 27  --  22 23  --   24  GLUCOSE 162*  --  98 143*  --  144*  BUN 15  --  16 35*  --  30*  CREATININE 0.71  --  0.66 0.93  --  0.72  CALCIUM 9.3  --  8.2* 7.7*  --  7.6*  MG  --  1.9  --  2.4  --   --   PHOS  --  2.5  --  1.2* 2.5  --    GFR: Estimated Creatinine Clearance: 70.1 mL/min (by C-G formula based on SCr of 0.72 mg/dL). Liver Function Tests: Recent Labs  Lab 06/09/20 0116 06/11/20 0343 06/12/20 0318  AST 18 36 19  ALT 12 28 25   ALKPHOS 126 179* 157*  BILITOT 0.6 0.7 0.5  PROT 7.8 5.3* 5.7*  ALBUMIN 3.8 2.3* 2.5*   Recent Labs  Lab 06/09/20 0116  LIPASE 21   No results for input(s): AMMONIA in the last 168 hours. Coagulation Profile: No results for input(s): INR, PROTIME in the last 168 hours. Cardiac Enzymes: No results for input(s): CKTOTAL, CKMB, CKMBINDEX, TROPONINI in the last 168 hours. BNP (last 3 results) No results for input(s): PROBNP in the last 8760 hours. HbA1C: No results for input(s): HGBA1C in the last 72 hours. CBG: No results for input(s): GLUCAP in the last 168 hours. Lipid Profile: No results for input(s): CHOL, HDL, LDLCALC, TRIG, CHOLHDL, LDLDIRECT in the last 72 hours. Thyroid Function Tests: No results for input(s): TSH, T4TOTAL, FREET4, T3FREE, THYROIDAB in the last 72 hours. Anemia Panel: Recent Labs    06/11/20 1801  VITAMINB12 564  FOLATE 6.5  FERRITIN 127  TIBC 220*  IRON 60  RETICCTPCT 2.1   Sepsis Labs: No results for input(s): PROCALCITON, LATICACIDVEN in the last 168 hours.  Recent Results (from the past 240 hour(s))  Respiratory Panel by RT PCR (Flu A&B, Covid) - Nasopharyngeal Swab     Status: None   Collection Time: 06/09/20  6:03 AM   Specimen: Nasopharyngeal Swab  Result Value Ref Range Status   SARS Coronavirus 2 by RT PCR NEGATIVE NEGATIVE Final    Comment: (NOTE) SARS-CoV-2 target nucleic acids are NOT DETECTED.  The SARS-CoV-2 RNA is generally detectable in upper respiratoy specimens during the acute phase of infection.  The lowest concentration of SARS-CoV-2 viral copies this assay can detect is 131 copies/mL. A negative result does not preclude SARS-Cov-2 infection and should not be used as the sole basis for treatment or other patient management decisions. A negative result may occur with  improper specimen collection/handling, submission of specimen other than nasopharyngeal swab, presence of viral mutation(s) within the areas targeted by this assay, and inadequate number of viral copies (<131 copies/mL). A negative result must be combined with clinical observations, patient history, and epidemiological information. The expected result is Negative.  Fact Sheet for Patients:  https://www.moore.com/  Fact Sheet for Healthcare Providers:  https://www.young.biz/  This test is no t yet approved or cleared by the [place] FDA and  has been authorized for detection and/or diagnosis of SARS-CoV-2 by FDA under an Emergency Use Authorization (EUA). This EUA will remain  in effect (meaning this test can be used) for the duration of the COVID-19 declaration under Section 564(b)(1) of the Act, 21 U.S.C. section 360bbb-3(b)(1), unless the authorization is terminated or revoked sooner.     Influenza A by PCR NEGATIVE NEGATIVE Final   Influenza B by PCR NEGATIVE NEGATIVE Final    Comment: (NOTE) The Xpert Xpress SARS-CoV-2/FLU/RSV assay is intended as an aid in  the diagnosis of influenza from Nasopharyngeal swab specimens and  should not be used as a sole basis for treatment. Nasal washings and  aspirates are unacceptable for Xpert Xpress SARS-CoV-2/FLU/RSV  testing.  Fact Sheet for Patients: https://www.moore.com/  Fact Sheet for Healthcare Providers: https://www.young.biz/  This test is not yet approved or cleared by the [place] FDA and  has been authorized for detection and/or diagnosis of SARS-CoV-2 by  FDA  under an Emergency Use Authorization (EUA). This EUA will remain  in effect (meaning this test can be used) for the duration of the  Covid-19 declaration under Section 564(b)(1) of the Act, 21  U.S.C. section 360bbb-3(b)(1), unless the authorization is  terminated or revoked. Performed at [facility], [address] [place], [place] [address]   MRSA PCR Screening     Status: None   Collection Time: 06/09/20 12:58 PM   Specimen: Nasal Mucosa; Nasopharyngeal  Result Value Ref Range Status   MRSA by PCR NEGATIVE NEGATIVE Final    Comment:        The GeneXpert MRSA Assay (FDA approved for NASAL specimens only), is one component of a comprehensive MRSA colonization surveillance program. It is not intended to diagnose MRSA infection nor to guide or monitor treatment for MRSA infections. Performed at [facility], [address] [place], [place] [address]     Radiology Studies: No results found.   LOS: 4 days   Lanae Boast, MD [facility]  06/13/2020, 12:14 PM

## 2020-06-13 NOTE — Progress Notes (Signed)
[facility] Progress Note  3 Days Post-Op  Subjective: Patient had tarry stool and coffee-ground emesis overnight. Reports some abdominal soreness in upper abdomen this AM. Denies nausea but reports dry mouth. GI had just seen and patient reports they are planning endoscopy today.   Objective: Vital signs in last 24 hours: Temp:  [97.6 F (36.4 C)-98.4 F (36.9 C)] 98.4 F (36.9 C) (10/08 0512) Pulse Rate:  [71-81] 81 (10/08 0512) Resp:  [18] 18 (10/08 0512) BP: (101-140)/(46-71) 135/56 (10/08 0512) SpO2:  [99 %-100 %] 100 % (10/08 0512) Weight:  [70 kg] 70 kg (10/08 0500) Last BM Date: 06/12/20  Intake/Output from previous day: 10/07 0701 - 10/08 0700 In: 2890.9 [P.O.:1680; I.V.:200; Blood:311; IV Piggyback:699.9] Out: 95 [Drains:95] Intake/Output this shift: Total I/O In: 0  Out: 15 [Drains:15]  PE: General: pleasant, WD, WNfemale who is laying in bed in NAD HEENT: Sclera are anicteric. PERRL. Ears and nose without any masses or lesions. Mouth is pink and moist Heart: regular, rate, and rhythm.  Lungs: CTAB, no wheezes, rhonchi, or rales noted. Respiratory effort nonlabored Abd: soft,appropriately ttp, ND, +BS,incisions c/d/i, drain with ss fluid   Lab Results:  Recent Labs    06/12/20 0318 06/12/20 0318 06/12/20 1459 06/13/20 0729  WBC 16.9*  --   --  13.8*  HGB 7.0*   < > 8.7* 8.9*  HCT 22.9*   < > 27.0* 27.5*  PLT 269  --   --  250   < > = values in this interval not displayed.   BMET Recent Labs    06/11/20 0343 06/12/20 0318  NA 134* 140  K 4.5 3.7  CL 105 109  CO2 23 24  GLUCOSE 143* 144*  BUN 35* 30*  CREATININE 0.93 0.72  CALCIUM 7.7* 7.6*   PT/INR No results for input(s): LABPROT, INR in the last 72 hours. CMP     Component Value Date/Time   NA 140 06/12/2020 0318   K 3.7 06/12/2020 0318   CL 109 06/12/2020 0318   CO2 24 06/12/2020 0318   GLUCOSE 144 (H) 06/12/2020 0318   BUN 30 (H) 06/12/2020 0318   CREATININE  0.72 06/12/2020 0318   CREATININE 0.67 12/10/2019 0941   CALCIUM 7.6 (L) 06/12/2020 0318   PROT 5.7 (L) 06/12/2020 0318   ALBUMIN 2.5 (L) 06/12/2020 0318   AST 19 06/12/2020 0318   ALT 25 06/12/2020 0318   ALKPHOS 157 (H) 06/12/2020 0318   BILITOT 0.5 06/12/2020 0318   GFRNONAA >60 06/12/2020 0318   GFRNONAA 96 12/10/2019 0941   GFRAA >60 06/10/2020 0336   GFRAA 111 12/10/2019 0941   Lipase     Component Value Date/Time   LIPASE 21 06/09/2020 0116       Studies/Results: No results found.  Anti-infectives: Anti-infectives (From admission, onward)   Start     Dose/Rate Route Frequency Ordered Stop   06/12/20 0000  cefdinir (OMNICEF) 300 MG capsule        300 mg Oral 2 times daily 06/12/20 0758 06/17/20 2359   06/09/20 1330  hydroxychloroquine (PLAQUENIL) tablet 200 mg        200 mg Oral 2 times daily 06/09/20 0829     06/09/20 1330  voriconazole (VFEND) tablet 200 mg        200 mg Oral Every 12 hours 06/09/20 0829     06/09/20 0700  ceFEPIme (MAXIPIME) 2 g in sodium chloride 0.9 % 100 mL IVPB  2 g 200 mL/hr over 30 Minutes Intravenous Every 8 hours 06/09/20 0635     06/09/20 0700  metroNIDAZOLE (FLAGYL) IVPB 500 mg        500 mg 100 mL/hr over 60 Minutes Intravenous Every 6 hours 06/09/20 0635     06/09/20 0615  cefTRIAXone (ROCEPHIN) 1 g in sodium chloride 0.9 % 100 mL IVPB  Status:  Discontinued        1 g 200 mL/hr over 30 Minutes Intravenous  Once 06/09/20 0611 06/09/20 0700       Assessment/Plan RA on daily prednisone and plaquenil Hx of fungal PNA followed by pulmonology, on chronic voriconazole  CHF - EF 60-65%  Acutegangrenouscholecystitis S/P laparoscopic cholecystectomy 06/10/20 Dr. Carolynne Edouard -POD#3 - mobilize - continue drain, SS and 95 cc out in last 24 hrs - will discuss further with MD today  ABL anemia - secondary to GI bleeding, suspect UGI - GI consulted and planning EGD today, appreciate assistance - Hgb seems to have stabilized this  AM s/p 1 unit PRBC yesterday   FEN:NPO DHR:CBUL ID: [id number]/4>>   LOS: 4 days    Juliet Rude , [facility] 06/13/2020, 10:17 AM Please see Amion for pager number during day hours 7:00am-4:30pm

## 2020-06-14 ENCOUNTER — Encounter (HOSPITAL_COMMUNITY)
Admission: EM | Disposition: A | Discharge status: Home / Self Care | Payer: Self-pay | Source: Home / Self Care | Attending: Internal Medicine

## 2020-06-14 ENCOUNTER — Inpatient Hospital Stay (HOSPITAL_COMMUNITY): Payer: 59

## 2020-06-14 DIAGNOSIS — K922 Gastrointestinal hemorrhage, unspecified: Secondary | ICD-10-CM | POA: Diagnosis not present

## 2020-06-14 DIAGNOSIS — I4891 Unspecified atrial fibrillation: Secondary | ICD-10-CM | POA: Diagnosis not present

## 2020-06-14 LAB — BASIC METABOLIC PANEL
Anion gap: 9 (ref 5–15)
BUN: 14 mg/dL (ref 6–20)
CO2: 25 mmol/L (ref 22–32)
Calcium: 7.8 mg/dL — ABNORMAL LOW (ref 8.9–10.3)
Chloride: 106 mmol/L (ref 98–111)
Creatinine, Ser: 0.6 mg/dL (ref 0.44–1.00)
GFR, Estimated: >60 mL/min (ref >60)
Glucose, Bld: 83 mg/dL (ref 70–99)
Potassium: 3.3 mmol/L — ABNORMAL LOW (ref 3.5–5.1)
Sodium: 140 mmol/L (ref 135–145)

## 2020-06-14 LAB — CBC
HCT: 26.2 % — ABNORMAL LOW (ref 36.0–46.0)
Hemoglobin: 8.5 g/dL — ABNORMAL LOW (ref 12.0–15.0)
MCH: 28.6 pg (ref 26.0–34.0)
MCHC: 32.4 g/dL (ref 30.0–36.0)
MCV: 88.2 fL (ref 80.0–100.0)
Platelets: 251 10*3/uL (ref 150–400)
RBC: 2.97 MIL/uL — ABNORMAL LOW (ref 3.87–5.11)
RDW: 16 % — ABNORMAL HIGH (ref 11.5–15.5)
WBC: 12.2 10*3/uL — ABNORMAL HIGH (ref 4.0–10.5)
nRBC: 0.7 % — ABNORMAL HIGH (ref 0.0–0.2)

## 2020-06-14 LAB — HEMOGLOBIN AND HEMATOCRIT, BLOOD
HCT: 26.8 % — ABNORMAL LOW (ref 36.0–46.0)
Hemoglobin: 8.5 g/dL — ABNORMAL LOW (ref 12.0–15.0)

## 2020-06-14 LAB — PREPARE RBC (CROSSMATCH)

## 2020-06-14 SURGERY — CANCELLED PROCEDURE
Anesthesia: Monitor Anesthesia Care

## 2020-06-14 MED ORDER — POTASSIUM CHLORIDE 10 MEQ/100ML IV SOLN
10.0000 meq | INTRAVENOUS | Status: AC
  Administered 2020-06-14 (×2): 10 meq via INTRAVENOUS
  Filled 2020-06-14 (×2): qty 100

## 2020-06-14 MED ORDER — DILTIAZEM HCL-DEXTROSE 125-5 MG/125ML-% IV SOLN (PREMIX)
5.0000 mg/h | INTRAVENOUS | Status: DC
  Administered 2020-06-14: 5 mg/h via INTRAVENOUS
  Filled 2020-06-14: qty 125

## 2020-06-14 MED ORDER — SODIUM CHLORIDE 0.9% IV SOLUTION: Freq: Once | INTRAVENOUS | Status: AC

## 2020-06-14 MED ORDER — DILTIAZEM HCL 30 MG PO TABS
30.0000 mg | ORAL_TABLET | Freq: Four times a day (QID) | ORAL | Status: DC
  Administered 2020-06-14 – 2020-06-16 (×8): 30 mg via ORAL
  Filled 2020-06-14 (×7): qty 1

## 2020-06-14 MED ORDER — POTASSIUM CHLORIDE CRYS ER 20 MEQ PO TBCR
20.0000 meq | EXTENDED_RELEASE_TABLET | Freq: Once | ORAL | Status: AC
  Administered 2020-06-14: 20 meq via ORAL
  Filled 2020-06-14: qty 1

## 2020-06-14 MED ORDER — DILTIAZEM LOAD VIA INFUSION
5.0000 mg | Freq: Once | INTRAVENOUS | Status: DC
  Filled 2020-06-14: qty 5

## 2020-06-14 MED ORDER — SODIUM CHLORIDE 0.9 % IV BOLUS
1000.0000 mL | Freq: Once | INTRAVENOUS | Status: AC
  Administered 2020-06-14: 1000 mL via INTRAVENOUS

## 2020-06-14 MED ORDER — SODIUM CHLORIDE 0.9 % IV BOLUS
500.0000 mL | Freq: Once | INTRAVENOUS | Status: AC
  Administered 2020-06-14: 500 mL via INTRAVENOUS

## 2020-06-14 SURGICAL SUPPLY — 14 items

## 2020-06-14 NOTE — Progress Notes (Signed)
Pt's Cardizem drip was dc'd per MD's order. At 1400 Cardizem drip was decreased to 5 mg. At 1430 drip was decrease to 2.5 mg given with 30 mg PO Cardizem. At 1458 Cardizem drip was stopped. Pt remained in NSR, HR remained in the 70s

## 2020-06-14 NOTE — Significant Event (Signed)
Rapid Response Event Note   Reason for Call :  MD noticed that patient pulse was rapid and irregular and ordered for bedside RN to obtain EKG. Bedside called rapid response for EKG reading of A-fib RVR.  Initial Focused Assessment:  Pt resting in bed no complaints of pain or discomfort. Pt states she can feel her heart beating in her chest. Palpation of pulse noted to be rapid and irregular. Pt hooked up to rapid monitor and was noted to be in a-fib RVR with a rate of 180.     Interventions:  MD notified of findings. Orders received for 1L fluid bolus, diltiazem gtt, and 1 unit of blood. Rapid response remained at bedside to initiate and monitor diltiazem infusion until patient could be transferred from med-surg to progressive care unit. After 500cc of 1 liter bolus pt noted to feel heaviness in chest and, upon auscultation of lungs LLL crackles heard and faint R sided wheezing noted. MD notified. No lasix orders for now due to soft BPs. Hold off on other half of NS bolus. CXR ordered. Pt remains on room air.   Plan of Care:  Pt transported to progressive bed 1434. HR remains tachy in a-fib on diltiazem gtt infusing at 10mg /hr. CXR at bedside. Pt left under progressive nurse care.  Event Summary:   MD Notified: , MD Call Time: 1010 Arrival Time: 1013 End Time: 1150  Dayna Barker, RN

## 2020-06-14 NOTE — H&P (View-Only) (Signed)
    Progress Note Weekend Coverage for Dr. Elnoria Howard  Subjective  Chief Complaint: Anemia, melena and coffee-ground emesis  This morning, patient went into A. fib with RVR in the resuscitation team was called, she was moved to an acute bed in room 1434.  She is still currently in A. fib but doing okay.  Tells me that she has seen no further GI bleeding, explains that she was "coughing up blood" before.  Denies any abdominal pain and no further melena.  Hemoglobin 8.9--> 8.5 overnight    Objective   Vital signs in last 24 hours: Temp:  [98 F (36.7 C)-98.6 F (37 C)] 98.6 F (37 C) (10/09 1223) Pulse Rate:  [79-187] 91 (10/09 1223) Resp:  [16-20] 18 (10/09 1223) BP: (89-121)/(50-96) 105/55 (10/09 1223) SpO2:  [96 %-100 %] 96 % (10/09 1223) Weight:  [69.9 kg] 69.9 kg (10/09 0500) Last BM Date: 06/11/20 General:    white female in NAD Heart:  Regular rate and rhythm; no murmurs Lungs: Respirations even and unlabored, lungs CTA bilaterally Abdomen:  Soft, nontender and nondistended. Normal bowel sounds. Extremities:  Without edema. Neurologic:  Alert and oriented,  grossly normal neurologically. Psych:  Cooperative. Normal mood and affect.  Intake/Output from previous day: 10/08 0701 - 10/09 0700 In: 1765.9 [P.O.:1050; IV Piggyback:715.9] Out: 65 [Drains:65] Intake/Output this shift: Total I/O In: 120 [P.O.:120] Out: -   Lab Results: Recent Labs    06/12/20 0318 06/12/20 0318 06/12/20 1459 06/13/20 0729 06/14/20 0337  WBC 16.9*  --   --  13.8* 12.2*  HGB 7.0*   < > 8.7* 8.9* 8.5*  HCT 22.9*   < > 27.0* 27.5* 26.2*  PLT 269  --   --  250 251   < > = values in this interval not displayed.   BMET Recent Labs    06/12/20 0318 06/14/20 0337  NA 140 140  K 3.7 3.3*  CL 109 106  CO2 24 25  GLUCOSE 144* 83  BUN 30* 14  CREATININE 0.72 0.60  CALCIUM 7.6* 7.8*   LFT Recent Labs    06/12/20 0318  PROT 5.7*  ALBUMIN 2.5*  AST 19  ALT 25  ALKPHOS 157*  BILITOT  0.5     Assessment / Plan:   Assessment: 1.  GERD/esophagitis 2.  Anemia: Hemoglobin mostly stable 8.9-->8.5 overnight  3.  Heme positive stool: No further melena overnight  Plan: 1.  Plans were for EGD today with Dr. Adela Lank but due to patient's acute A. fib with RVR we will hold off on this.  We will plan to move her to tomorrow and just see how she is doing. 2.  Patient can be on a clear liquid diet today and n.p.o. after midnight 3.  Continue to follow hemoglobin with transfusion as needed 4.  Please await further recommendations from Dr. Adela Lank later today  Thank you for kind consultation, we will continue to follow for Dr. Elnoria Howard over the weekend   LOS: 5 days   Unk Lightning  06/14/2020, 12:39 PM

## 2020-06-14 NOTE — Progress Notes (Signed)
[facility] Progress Note  4 Days Post-Op  Subjective: Patient had tarry stools. Reports some abdominal soreness in upper abdomen this AM. EGD today   Objective: Vital signs in last 24 hours: Temp:  [98 F (36.7 C)-98.3 F (36.8 C)] 98.3 F (36.8 C) (10/09 0505) Pulse Rate:  [79-100] 98 (10/09 0505) Resp:  [16-20] 16 (10/09 0505) BP: (107-121)/(64-96) 107/64 (10/09 0505) SpO2:  [96 %-99 %] 96 % (10/09 0505) Weight:  [69.9 kg] 69.9 kg (10/09 0500) Last BM Date: 06/13/20  Intake/Output from previous day: 10/08 0701 - 10/09 0700 In: 1765.9 [P.O.:1050; IV Piggyback:715.9] Out: 65 [Drains:65] Intake/Output this shift: No intake/output data recorded.  PE: General: pleasant, WD, WNfemale who is laying in bed in NAD HEENT: Sclera are anicteric. PERRL. Heart: rrr Lungs: normal work of breathing Abd: soft,nontender, ND, +BS,incisions c/d/i, drain with ss fluid   Lab Results:  Recent Labs    06/13/20 0729 06/14/20 0337  WBC 13.8* 12.2*  HGB 8.9* 8.5*  HCT 27.5* 26.2*  PLT 250 251   BMET Recent Labs    06/12/20 0318 06/14/20 0337  NA 140 140  K 3.7 3.3*  CL 109 106  CO2 24 25  GLUCOSE 144* 83  BUN 30* 14  CREATININE 0.72 0.60  CALCIUM 7.6* 7.8*   PT/INR No results for input(s): LABPROT, INR in the last 72 hours. CMP     Component Value Date/Time   NA 140 06/14/2020 0337   K 3.3 (L) 06/14/2020 0337   CL 106 06/14/2020 0337   CO2 25 06/14/2020 0337   GLUCOSE 83 06/14/2020 0337   BUN 14 06/14/2020 0337   CREATININE 0.60 06/14/2020 0337   CREATININE 0.67 12/10/2019 0941   CALCIUM 7.8 (L) 06/14/2020 0337   PROT 5.7 (L) 06/12/2020 0318   ALBUMIN 2.5 (L) 06/12/2020 0318   AST 19 06/12/2020 0318   ALT 25 06/12/2020 0318   ALKPHOS 157 (H) 06/12/2020 0318   BILITOT 0.5 06/12/2020 0318   GFRNONAA >60 06/14/2020 0337   GFRNONAA 96 12/10/2019 0941   GFRAA >60 06/10/2020 0336   GFRAA 111 12/10/2019 0941   Lipase     Component Value  Date/Time   LIPASE 21 06/09/2020 0116       Studies/Results: No results found.  Anti-infectives: Anti-infectives (From admission, onward)   Start     Dose/Rate Route Frequency Ordered Stop   06/12/20 0000  cefdinir (OMNICEF) 300 MG capsule        300 mg Oral 2 times daily 06/12/20 0758 06/17/20 2359   06/09/20 1330  hydroxychloroquine (PLAQUENIL) tablet 200 mg        200 mg Oral 2 times daily 06/09/20 0829     06/09/20 1330  voriconazole (VFEND) tablet 200 mg        200 mg Oral Every 12 hours 06/09/20 0829     06/09/20 0700  ceFEPIme (MAXIPIME) 2 g in sodium chloride 0.9 % 100 mL IVPB        2 g 200 mL/hr over 30 Minutes Intravenous Every 8 hours 06/09/20 0635     06/09/20 0700  metroNIDAZOLE (FLAGYL) IVPB 500 mg        500 mg 100 mL/hr over 60 Minutes Intravenous Every 6 hours 06/09/20 0635     06/09/20 0615  cefTRIAXone (ROCEPHIN) 1 g in sodium chloride 0.9 % 100 mL IVPB  Status:  Discontinued        1 g 200 mL/hr over 30 Minutes Intravenous  Once 06/09/20 9518  06/09/20 0700       Assessment/Plan RA on daily prednisone and plaquenil Hx of fungal PNA followed by pulmonology, on chronic voriconazole  CHF - EF 60-65%  Acutegangrenouscholecystitis S/P laparoscopic cholecystectomy 06/10/20 Dr. Carolynne Edouard -POD#4 - mobilize - continue drain, SS and 65 cc out in last 24 hrs - possibly remove tomorrow ABL anemia - secondary to GI bleeding, suspect UGI - GI consulted and planning EGD - Hgb seems to have stabilized  TJQ:ZESP as per GI, no restrictions from our standpoint QZR:AQTM ID: [id number]/4>>   LOS: 5 days   Marin Olp, M.D. [facility] Use [email or web address] to contact on call provider

## 2020-06-14 NOTE — Progress Notes (Signed)
    Progress Note Weekend Coverage for Dr. Hung  Subjective  Chief Complaint: Anemia, melena and coffee-ground emesis  This morning, patient went into A. fib with RVR in the resuscitation team was called, she was moved to an acute bed in room 1434.  She is still currently in A. fib but doing okay.  Tells me that she has seen no further GI bleeding, explains that she was "coughing up blood" before.  Denies any abdominal pain and no further melena.  Hemoglobin 8.9--> 8.5 overnight    Objective   Vital signs in last 24 hours: Temp:  [98 F (36.7 C)-98.6 F (37 C)] 98.6 F (37 C) (10/09 1223) Pulse Rate:  [79-187] 91 (10/09 1223) Resp:  [16-20] 18 (10/09 1223) BP: (89-121)/(50-96) 105/55 (10/09 1223) SpO2:  [96 %-100 %] 96 % (10/09 1223) Weight:  [69.9 kg] 69.9 kg (10/09 0500) Last BM Date: 06/11/20 General:    white female in NAD Heart:  Regular rate and rhythm; no murmurs Lungs: Respirations even and unlabored, lungs CTA bilaterally Abdomen:  Soft, nontender and nondistended. Normal bowel sounds. Extremities:  Without edema. Neurologic:  Alert and oriented,  grossly normal neurologically. Psych:  Cooperative. Normal mood and affect.  Intake/Output from previous day: 10/08 0701 - 10/09 0700 In: 1765.9 [P.O.:1050; IV Piggyback:715.9] Out: 65 [Drains:65] Intake/Output this shift: Total I/O In: 120 [P.O.:120] Out: -   Lab Results: Recent Labs    06/12/20 0318 06/12/20 0318 06/12/20 1459 06/13/20 0729 06/14/20 0337  WBC 16.9*  --   --  13.8* 12.2*  HGB 7.0*   < > 8.7* 8.9* 8.5*  HCT 22.9*   < > 27.0* 27.5* 26.2*  PLT 269  --   --  250 251   < > = values in this interval not displayed.   BMET Recent Labs    06/12/20 0318 06/14/20 0337  NA 140 140  K 3.7 3.3*  CL 109 106  CO2 24 25  GLUCOSE 144* 83  BUN 30* 14  CREATININE 0.72 0.60  CALCIUM 7.6* 7.8*   LFT Recent Labs    06/12/20 0318  PROT 5.7*  ALBUMIN 2.5*  AST 19  ALT 25  ALKPHOS 157*  BILITOT  0.5     Assessment / Plan:   Assessment: 1.  GERD/esophagitis 2.  Anemia: Hemoglobin mostly stable 8.9-->8.5 overnight  3.  Heme positive stool: No further melena overnight  Plan: 1.  Plans were for EGD today with Dr. Armbruster but due to patient's acute A. fib with RVR we will hold off on this.  We will plan to move her to tomorrow and just see how she is doing. 2.  Patient can be on a clear liquid diet today and n.p.o. after midnight 3.  Continue to follow hemoglobin with transfusion as needed 4.  Please await further recommendations from Dr. Armbruster later today  Thank you for kind consultation, we will continue to follow for Dr. Hung over the weekend   LOS: 5 days   Nekisha Mcdiarmid Lynne Bunny Lowdermilk  06/14/2020, 12:39 PM   

## 2020-06-14 NOTE — Progress Notes (Signed)
   06/14/20 1010  Assess: MEWS Score  Temp 98.4 F (36.9 C)  BP (!) 89/52  Pulse Rate (!) 187  Resp 20  Level of Consciousness Alert  SpO2 100 %  O2 Device Room Air  Assess: MEWS Score  MEWS Temp 0  MEWS Systolic 1  MEWS Pulse 3  MEWS RR 0  MEWS LOC 0  MEWS Score 4  MEWS Score Color Red  Assess: if the MEWS score is Yellow or Red  Were vital signs taken at a resting state? Yes  Focused Assessment Change from prior assessment (see assessment flowsheet)  Early Detection of Sepsis Score *See Row Information* Low  MEWS guidelines implemented *See Row Information* Yes  Take Vital Signs  Increase Vital Sign Frequency  Red: Q 1hr X 4 then Q 4hr X 4, if remains red, continue Q 4hrs  Escalate  MEWS: Escalate Red: discuss with charge nurse/RN and provider, consider discussing with RRT  Notify: Charge Nurse/RN  Name of Charge Nurse/RN Notified Marni Griffon, RN  Date Charge Nurse/RN Notified 06/14/20  Time Charge Nurse/RN Notified 1010  Notify: Provider  Provider Name/Title Lanae Boast  Date Provider Notified 06/14/20  Time Provider Notified 1010  Notification Type Call (EKG results and recent VS)  Notification Reason Change in status  Response See new orders  Date of Provider Response 06/14/20  Time of Provider Response 1012  Notify: Rapid Response  Name of Rapid Response RN Notified Cathe Mons, RN  Date Rapid Response Notified 06/14/20  Time Rapid Response Notified 1010  Document  Patient Outcome Not stable and remains on department  Progress note created (see row info) Yes

## 2020-06-14 NOTE — Progress Notes (Signed)
Rapid Response RN, Maralyn Sago, still monitoring pt and AFib. Denies pain or discomfort.

## 2020-06-14 NOTE — Progress Notes (Signed)
PROGRESS NOTE    Kimberly Shelton  AYT:[id number] DOB: Jan 17, 1961 DOA: 06/09/2020 PCP: Kristian Covey, MD   Chief Complaint  Patient presents with  . Abdominal Pain    Brief Narrative: As per previous attending : 59YoF w/  RA, on Plaquenil and low-dose prednisone, ILD/RUL cavitary lesion thought to be aspergillosis with chronic cough and hemoptysis being managed by pulmonary currently on voriconazole,diastolic CHF and tobacco use presenting with lower abdominal pain and vomiting for 1 day, and found to have  cholelithiasis, acute cholecystitis and pancreatic duct dilation to 6 mm in the head of pancreas.  LFT and lipase within normal.MRCP confirms cholelithiasis with suspected acute cholecystitis, and also CBD to 11 mm with suspected 5 mm distal CBD stone at the ampulla.  Seen by pulmonary, surgery. S/p laparoscopic cholecystectomy 06/10/2020 Dr.Toth. Postop doing well.  Abdomen is soft pain controlled. Hemoglobin 7.0 g likely multifactorial-from chronic hemodialysis postop and hemodilution.  1 unit PRBC transfused-h/h stabilized  Subjective: Denies any nausea vomiting or bowel movement since yesterday.   H&H is stable.   On exam noted to be tachycardic with irregular pulse- obtained EKG and HR in 150-170s w A fib- new onset. Feels anxious for the procedure but denies palpitation chest pain shortness of breath.    Assessment & Plan:  Acute gangrenous cholecystitis with cholelithiasis: s/p laparoscopic cholecystectomy 06/10/2028 by Dr. Carolynne Edouard.  Postop doing well, on IV antibiotics and leukocytosis downtrending.Appreciate surgery input.    New onset A. fib with RVR heart rate in 150s to 170s after feeling irregular pulse on exam-EKG obtained and shows A. fib.  Likely multifactorial in the setting of anemia, postop status as well as anxiety due to upcoming endoscopy-BP soft in 90s-110s: Consulted and discussed  W/ on-call cardiologist Dr. Elease Hashimoto: Per recommendation we will give fluid bolus  - ordered 500 ml only ginv chf ( check cxr after ivf),and start IV Cardizem w/o bolus as BP soft. She appears to be a poor candidate for anticoagulation given her chronic hemoptysis and now black tarry stool but cardiology will make a determination. Cardio is going to see her today.  She is relatively asymptomatic without chest pain shortness of breath or palpitation.  Has mild dizziness and feels weak.  Acute on chronic anemia/coffee-ground emesis and black his stool FOBT positive consistent with ABLA: Anemia  likley multifactorial suspecting upper GI bleeding during of chronic steroid, s/p surgery and also with chronic hemoptysis: GI following and plan for EGD, keep on IV PPI twice daily.  Given new onset A. Fib EGD is on hold this morning.  Recent Labs  Lab 06/11/20 0343 06/12/20 0318 06/12/20 1459 06/13/20 0729 06/14/20 0337  HGB 7.8* 7.0* 8.7* 8.9* 8.5*  HCT 25.3* 22.9* 27.0* 27.5* 26.2*   Hypophosphatemia:resolved.  Hypokalemia:will replete IV and oral potassium chloride supplementation.  Interstitial lung disease:in the setting of autoimmune disease/RA; on chronic Plaquenil and prednisone.  Was on stress dose steroid here and now being tapered to oral prednisone.  Aspergilloma with associated chronic bronchiectasis/?  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 [name] and seen by pulmonary while here.    Chronic hemoptysis since 2019 with aspergilloma/chronic bronchiectasis:She is being followed by Dr. Deatra Canter as outpatient and patient tells me that there is plan for her to go through IR procedure at some point in the future.    Chronic diastolic heart failure: Patient reports she got excessive fluid when she had pneumonia a few years ago and wanted to heart failure. Watch for  fluid overload-Not on diuretics.  I am getting chest x-ray after IV fluid bolus.  Diet Order            Diet clear liquid Room service appropriate? Yes; Fluid consistency: Thin   Diet effective now                 Nutrition Problem: Inadequate oral intake Etiology: acute illness (acute cholecystitis) Signs/Symptoms: per patient/family report Interventions: Refer to RD note for recommendations  Body mass index is 27.3 kg/m.  DVT prophylaxis: Place and maintain sequential compression device Start: 06/09/20 1404 SCDs Start: 06/09/20 0830 Code Status:   Code Status: Full Code  Family Communication: plan of care discussed with patient at bedside.  Status is: Inpatient Remains inpatient appropriate because:Inpatient level of care appropriate due to severity of illness and Ongoing monitoring of diet tolerance status post gallbladder surgery now with new onset rapid A. fib with RVR.  Dispo: The patient is from: Home.              Anticipated d/c is to: Home.              Anticipated d/c date is:  2 days              Patient currently is not medically stable to d/c.  Transfer to progressive care unit-monitor closely. Consultants:see note  Procedures:see note  Culture/Microbiology    Component Value Date/Time   SDES  11/27/2019 1529    BRONCHIAL ALVEOLAR LAVAGE RUL Performed at [facility], [address] [place], [place] [address]    SPECREQUEST  11/27/2019 1529    Immunocompromised Performed at [facility], [address] [place], [place] [address]    CULT  11/27/2019 1529    FEW Consistent with normal respiratory flora. Performed at [facility], [address], [place], [place] [address]    REPTSTATUS 11/30/2019 FINAL 11/27/2019 1529    Other culture-see note  Medications: Scheduled Meds: . sodium chloride   Intravenous Once  . acetaminophen  1,000 mg Oral TID  . diltiazem  5 mg Intravenous Once  . escitalopram  10 mg Oral Daily  . hydroxychloroquine  200 mg Oral BID  . lip balm  1 application Topical BID  . pantoprazole (PROTONIX) IV  40 mg Intravenous Q12H  . potassium chloride  20 mEq Oral  Once  . [START ON 06/15/2020] predniSONE  2.5 mg Oral Q breakfast  . voriconazole  200 mg Oral Q12H   Continuous Infusions: . ceFEPime (MAXIPIME) IV 2 g (06/14/20 0644)  . diltiazem (CARDIZEM) infusion 5 mg/hr (06/14/20 1109)  . metronidazole 500 mg (06/14/20 0737)  . ondansetron (ZOFRAN) IV    . potassium chloride      Antimicrobials: Anti-infectives (From admission, onward)   Start     Dose/Rate Route Frequency Ordered Stop   06/12/20 0000  cefdinir (OMNICEF) 300 MG capsule        300 mg Oral 2 times daily 06/12/20 0758 06/17/20 2359   06/09/20 1330  hydroxychloroquine (PLAQUENIL) tablet 200 mg        200 mg Oral 2 times daily 06/09/20 0829     06/09/20 1330  voriconazole (VFEND) tablet 200 mg        200 mg Oral Every 12 hours 06/09/20 0829     06/09/20 0700  ceFEPIme (MAXIPIME) 2 g in sodium chloride 0.9 % 100 mL IVPB        2 g 200 mL/hr over  30 Minutes Intravenous Every 8 hours 06/09/20 0635     06/09/20 0700  metroNIDAZOLE (FLAGYL) IVPB 500 mg        500 mg 100 mL/hr over 60 Minutes Intravenous Every 6 hours 06/09/20 0635     06/09/20 0615  cefTRIAXone (ROCEPHIN) 1 g in sodium chloride 0.9 % 100 mL IVPB  Status:  Discontinued        1 g 200 mL/hr over 30 Minutes Intravenous  Once 06/09/20 0611 06/09/20 0700     Objective: Vitals: Today's Vitals   06/14/20 0642 06/14/20 0729 06/14/20 0800 06/14/20 1010  BP:   108/69 (!) 89/52  Pulse:   98 (!) 187  Resp:   16 20  Temp:   98.4 F (36.9 C) 98.4 F (36.9 C)  TempSrc:   Oral Oral  SpO2:   100% 100%  Weight:      Height:      PainSc: 7  4  2       Intake/Output Summary (Last 24 hours) at 06/14/2020 1134 Last data filed at 06/14/2020 0900 Gross per 24 hour  Intake 1885.89 ml  Output 50 ml  Net 1835.89 ml   Filed Weights   06/10/20 0500 06/13/20 0500 06/14/20 0500  Weight: 70.6 kg 70 kg 69.9 kg   Weight change: -0.1 kg  Intake/Output from previous day: 10/08 0701 - 10/09 0700 In: 1765.9 [P.O.:1050; IV  Piggyback:715.9] Out: 65 [Drains:65] Intake/Output this shift: Total I/O In: 120 [P.O.:120] Out: -   Examination:  General exam: AAOx3,NAD, anxious,weak appearing. HEENT:Oral mucosa moist, Ear/Nose WNL grossly, dentition normal. Respiratory system:bilaterally clear,no wheezing or crackles,no use of accessory muscle Cardiovascular system: S1 & S2 +,irregularly irregular,No JVD. Gastrointestinal system: Abdomen soft, NT,ND, BS+ Nervous System:Alert, awake, moving extremities and grossly nonfocal Extremities: No edema, distal peripheral pulses palpable.  Skin: No rashes,no icterus. MSK: Normal muscle bulk,tone, power  Data Reviewed: I have personally reviewed following labs and imaging studies CBC: Recent Labs  Lab 06/10/20 0336 06/10/20 0336 06/11/20 0343 06/12/20 0318 06/12/20 1459 06/13/20 0729 06/14/20 0337  WBC 23.4*  --  18.8* 16.9*  --  13.8* 12.2*  NEUTROABS  --   --  17.6*  --   --   --   --   HGB 10.8*   < > 7.8* 7.0* 8.7* 8.9* 8.5*  HCT 34.4*   < > 25.3* 22.9* 27.0* 27.5* 26.2*  MCV 92.0  --  95.5 93.9  --  89.9 88.2  PLT 261  --  232 269  --  250 251   < > = values in this interval not displayed.   Basic Metabolic Panel: Recent Labs  Lab 06/09/20 0116 06/09/20 0635 06/10/20 0336 06/11/20 0343 06/11/20 1801 06/12/20 0318 06/14/20 0337  NA 136  --  137 134*  --  140 140  K 4.1  --  4.2 4.5  --  3.7 3.3*  CL 97*  --  105 105  --  109 106  CO2 27  --  22 23  --  24 25  GLUCOSE 162*  --  98 143*  --  144* 83  BUN 15  --  16 35*  --  30* 14  CREATININE 0.71  --  0.66 0.93  --  0.72 0.60  CALCIUM 9.3  --  8.2* 7.7*  --  7.6* 7.8*  MG  --  1.9  --  2.4  --   --   --   PHOS  --  2.5  --  1.2* 2.5  --   --    GFR: Estimated Creatinine Clearance: 70.1 mL/min (by C-G formula based on SCr of 0.6 mg/dL). Liver Function Tests: Recent Labs  Lab 06/09/20 0116 06/11/20 0343 06/12/20 0318  AST 18 36 19  ALT ALKPHOS 126 179* 157*  BILITOT 0.6 0.7  0.5  PROT 7.8 5.3* 5.7*  ALBUMIN 3.8 2.3* 2.5*   Recent Labs  Lab 06/09/20 0116  LIPASE 21   No results for input(s): AMMONIA in the last 168 hours. Coagulation Profile: No results for input(s): INR, PROTIME in the last 168 hours. Cardiac Enzymes: No results for input(s): CKTOTAL, CKMB, CKMBINDEX, TROPONINI in the last 168 hours. BNP (last 3 results) No results for input(s): PROBNP in the last 8760 hours. HbA1C: No results for input(s): HGBA1C in the last 72 hours. CBG: No results for input(s): GLUCAP in the last 168 hours. Lipid Profile: No results for input(s): CHOL, HDL, LDLCALC, TRIG, CHOLHDL, LDLDIRECT in the last 72 hours. Thyroid Function Tests: No results for input(s): TSH, T4TOTAL, FREET4, T3FREE, THYROIDAB in the last 72 hours. Anemia Panel: Recent Labs    06/11/20 1801  VITAMINB12 564  FOLATE 6.5  FERRITIN 127  TIBC 220*  IRON 60  RETICCTPCT 2.1   Sepsis Labs: No results for input(s): PROCALCITON, LATICACIDVEN in the last 168 hours.  Recent Results (from the past 240 hour(s))  Respiratory Panel by RT PCR (Flu A&B, Covid) - Nasopharyngeal Swab     Status: None   Collection Time: 06/09/20  6:03 AM   Specimen: Nasopharyngeal Swab  Result Value Ref Range Status   SARS Coronavirus 2 by RT PCR NEGATIVE NEGATIVE Final    Comment: (NOTE) SARS-CoV-2 target nucleic acids are NOT DETECTED.  The SARS-CoV-2 RNA is generally detectable in upper respiratoy specimens during the acute phase of infection. The lowest concentration of SARS-CoV-2 viral copies this assay can detect is 131 copies/mL. A negative result does not preclude SARS-Cov-2 infection and should not be used as the sole basis for treatment or other patient management decisions. A negative result may occur with  improper specimen collection/handling, submission of specimen other than nasopharyngeal swab, presence of viral mutation(s) within the areas targeted by this assay, and inadequate number of viral  copies (<131 copies/mL). A negative result must be combined with clinical observations, patient history, and epidemiological information. The expected result is Negative.  Fact Sheet for Patients:  https://www.moore.com/  Fact Sheet for Healthcare Providers:  https://www.young.biz/  This test is no t yet approved or cleared by the [place] FDA and  has been authorized for detection and/or diagnosis of SARS-CoV-2 by FDA under an Emergency Use Authorization (EUA). This EUA will remain  in effect (meaning this test can be used) for the duration of the COVID-19 declaration under Section 564(b)(1) of the Act, 21 U.S.C. section 360bbb-3(b)(1), unless the authorization is terminated or revoked sooner.     Influenza A by PCR NEGATIVE NEGATIVE Final   Influenza B by PCR NEGATIVE NEGATIVE Final    Comment: (NOTE) The Xpert Xpress SARS-CoV-2/FLU/RSV assay is intended as an aid in  the diagnosis of influenza from Nasopharyngeal swab specimens and  should not be used as a sole basis for treatment. Nasal washings and  aspirates are unacceptable for Xpert Xpress SARS-CoV-2/FLU/RSV  testing.  Fact Sheet for Patients: https://www.moore.com/  Fact Sheet for Healthcare Providers: https://www.young.biz/  This test is not yet approved or cleared by the [place] and  has been authorized  for detection and/or diagnosis of SARS-CoV-2 by  FDA under an Emergency Use Authorization (EUA). This EUA will remain  in effect (meaning this test can be used) for the duration of the  Covid-19 declaration under Section 564(b)(1) of the Act, 21  U.S.C. section 360bbb-3(b)(1), unless the authorization is  terminated or revoked. Performed at [facility], [address], [place], [place] [address]   MRSA PCR Screening     Status: None   Collection Time: 06/09/20 12:58 PM   Specimen: Nasal Mucosa;  Nasopharyngeal  Result Value Ref Range Status   MRSA by PCR NEGATIVE NEGATIVE Final    Comment:        The GeneXpert MRSA Assay (FDA approved for NASAL specimens only), is one component of a comprehensive MRSA colonization surveillance program. It is not intended to diagnose MRSA infection nor to guide or monitor treatment for MRSA infections. Performed at [facility], [address], [place], [place] [address]     Radiology Studies: No results found.   LOS: 5 days   Lanae Boast, MD [facility]  06/14/2020, 11:34 AM

## 2020-06-14 NOTE — Progress Notes (Signed)
Transferred via bed to 4th floor Telemetry rm 1434. Accompanied by RRT RN and [facility] RN student- Tiffany. Report called earlier on pt to 4th floor Charge RN, Shon Hale.

## 2020-06-15 ENCOUNTER — Inpatient Hospital Stay (HOSPITAL_COMMUNITY): Payer: 59 | Admitting: Certified Registered Nurse Anesthetist

## 2020-06-15 ENCOUNTER — Encounter (HOSPITAL_COMMUNITY)
Admission: EM | Disposition: A | Discharge status: Home / Self Care | Payer: Self-pay | Source: Home / Self Care | Attending: Internal Medicine

## 2020-06-15 DIAGNOSIS — K259 Gastric ulcer, unspecified as acute or chronic, without hemorrhage or perforation: Secondary | ICD-10-CM

## 2020-06-15 DIAGNOSIS — I480 Paroxysmal atrial fibrillation: Secondary | ICD-10-CM

## 2020-06-15 DIAGNOSIS — K269 Duodenal ulcer, unspecified as acute or chronic, without hemorrhage or perforation: Secondary | ICD-10-CM | POA: Diagnosis not present

## 2020-06-15 HISTORY — PX: ESOPHAGOGASTRODUODENOSCOPY (EGD) WITH PROPOFOL: SHX5813

## 2020-06-15 HISTORY — PX: BIOPSY: SHX5522

## 2020-06-15 LAB — BASIC METABOLIC PANEL
Anion gap: 8 (ref 5–15)
BUN: 14 mg/dL (ref 6–20)
CO2: 25 mmol/L (ref 22–32)
Calcium: 7.8 mg/dL — ABNORMAL LOW (ref 8.9–10.3)
Chloride: 107 mmol/L (ref 98–111)
Creatinine, Ser: 0.55 mg/dL (ref 0.44–1.00)
GFR, Estimated: >60 mL/min (ref >60)
Glucose, Bld: 82 mg/dL (ref 70–99)
Potassium: 3.5 mmol/L (ref 3.5–5.1)
Sodium: 140 mmol/L (ref 135–145)

## 2020-06-15 LAB — CBC
HCT: 26.6 % — ABNORMAL LOW (ref 36.0–46.0)
Hemoglobin: 8.2 g/dL — ABNORMAL LOW (ref 12.0–15.0)
MCH: 28.6 pg (ref 26.0–34.0)
MCHC: 30.8 g/dL (ref 30.0–36.0)
MCV: 92.7 fL (ref 80.0–100.0)
Platelets: 321 10*3/uL (ref 150–400)
RBC: 2.87 MIL/uL — ABNORMAL LOW (ref 3.87–5.11)
RDW: 15.9 % — ABNORMAL HIGH (ref 11.5–15.5)
WBC: 14.6 10*3/uL — ABNORMAL HIGH (ref 4.0–10.5)
nRBC: 0.9 % — ABNORMAL HIGH (ref 0.0–0.2)

## 2020-06-15 SURGERY — ESOPHAGOGASTRODUODENOSCOPY (EGD) WITH PROPOFOL
Anesthesia: Monitor Anesthesia Care

## 2020-06-15 MED ORDER — SUCRALFATE 1 GM/10ML PO SUSP
1.0000 g | Freq: Three times a day (TID) | ORAL | Status: DC
  Administered 2020-06-15 – 2020-06-16 (×4): 1 g via ORAL
  Filled 2020-06-15 (×3): qty 10

## 2020-06-15 MED ORDER — ONDANSETRON HCL 4 MG/2ML IJ SOLN
INTRAMUSCULAR | Status: DC | PRN
  Administered 2020-06-15: 4 mg via INTRAVENOUS

## 2020-06-15 MED ORDER — PROPOFOL 1000 MG/100ML IV EMUL
INTRAVENOUS | Status: AC
  Filled 2020-06-15: qty 100

## 2020-06-15 MED ORDER — PROPOFOL 500 MG/50ML IV EMUL
INTRAVENOUS | Status: DC | PRN
  Administered 2020-06-15: 100 ug/kg/min via INTRAVENOUS

## 2020-06-15 MED ORDER — LIDOCAINE HCL (CARDIAC) PF 100 MG/5ML IV SOSY
PREFILLED_SYRINGE | INTRAVENOUS | Status: DC | PRN
  Administered 2020-06-15: 40 mg via INTRAVENOUS

## 2020-06-15 MED ORDER — LACTATED RINGERS IV SOLN: Freq: Once | INTRAVENOUS | Status: AC

## 2020-06-15 MED ORDER — PROPOFOL 10 MG/ML IV BOLUS
INTRAVENOUS | Status: DC | PRN
  Administered 2020-06-15 (×2): 20 mg via INTRAVENOUS
  Administered 2020-06-15: 40 mg via INTRAVENOUS
  Administered 2020-06-15: 20 mg via INTRAVENOUS

## 2020-06-15 MED ORDER — PROPOFOL 500 MG/50ML IV EMUL
INTRAVENOUS | Status: AC
  Filled 2020-06-15: qty 200

## 2020-06-15 MED ORDER — LACTATED RINGERS IV SOLN: INTRAVENOUS | Status: DC | PRN

## 2020-06-15 SURGICAL SUPPLY — 15 items

## 2020-06-15 NOTE — Transfer of Care (Signed)
Immediate Anesthesia Transfer of Care Note  Patient: Kimberly Shelton  Procedure(s) Performed: ESOPHAGOGASTRODUODENOSCOPY (EGD) WITH PROPOFOL (N/A ) BIOPSY  Patient Location: PACU  Anesthesia Type:MAC  Level of Consciousness: awake, alert , oriented and patient cooperative  Airway & Oxygen Therapy: Patient Spontanous Breathing and Patient connected to face mask oxygen  Post-op Assessment: Report given to RN and Post -op Vital signs reviewed and stable  Post vital signs: Reviewed and stable  Last Vitals:  Vitals Value Taken Time  BP 101/56   Temp    Pulse 69 06/15/20 1647  Resp    SpO2 100 % 06/15/20 1647  Vitals shown include unvalidated device data.  Last Pain:  Vitals:   06/15/20 1525  TempSrc: Temporal  PainSc: 0-No pain      Patients Stated Pain Goal: 2 (70/26/37 8588)  Complications: No complications documented.

## 2020-06-15 NOTE — Op Note (Signed)
[facility] Patient Name: Kimberly Shelton Procedure Date: 06/15/2020 MRN: [id number] Attending MD: Carlota Raspberry. Havery Moros , MD Date of Birth: August 20, 1961 CSN: [id number] Age: 59 Admit Type: Inpatient Procedure:                Upper GI endoscopy Indications:              Melena, anemia, abdominal pain Providers:                Carlota Raspberry. Havery Moros, MD, Baird Cancer, RN, Laverda Sorenson, Technician Referring MD:              Medicines:                Monitored Anesthesia Care Complications:            No immediate complications. Estimated blood loss:                            Minimal. Estimated Blood Loss:     Estimated blood loss was minimal. Procedure:                Pre-Anesthesia Assessment:                           - Prior to the procedure, a History and Physical                            was performed, and patient medications and                            allergies were reviewed. The patient's tolerance of                            previous anesthesia was also reviewed. The risks                            and benefits of the procedure and the sedation                            options and risks were discussed with the patient.                            All questions were answered, and informed consent                            was obtained. Prior Anticoagulants: The patient has                            taken no previous anticoagulant or antiplatelet                            agents. ASA Grade Assessment: III - A patient with  severe systemic disease. After reviewing the risks                            and benefits, the patient was deemed in                            satisfactory condition to undergo the procedure.                           After obtaining informed consent, the endoscope was                            passed under direct vision. Throughout the                            procedure, the  patient's blood pressure, pulse, and                            oxygen saturations were monitored continuously. The                            GIF-H190 ([id number]) Olympus gastroscope was                            introduced through the mouth, and advanced to the                            second part of duodenum. The upper GI endoscopy was                            accomplished without difficulty. The patient                            tolerated the procedure well. Scope In: Scope Out: Findings:      Esophagogastric landmarks were identified: the gastroesophageal junction       was found at 32 cm and the upper extent of the gastric folds was found       at 37 cm from the incisors.      A 5 cm hiatal hernia was present.      LA Grade D esophagitis with no bleeding was found 20 to 32 cm from the       incisors. The entire lower esophagus was ulcerated diffusely. Z line was       not clear, I suspect underlying Barrett's. Biopsies were taken with a       cold forceps for histology to rule out viral mediated process.      The exam of the esophagus was otherwise normal.      One non-bleeding clean based cratered gastric ulcer with no stigmata of       bleeding was found in the gastric body. The lesion was 4 mm in largest       dimension.      The exam of the stomach was otherwise normal. Due to large hiatal hernia       the patient did not retain air well in the proximal stomach.  Biopsies were taken with a cold forceps in the gastric body, at the       incisura and in the gastric antrum for Helicobacter pylori testing.      Many (roughly 20) non-bleeding cratered and superficial duodenal ulcers       with no stigmata of bleeding were found in the second portion of the       duodenum and in the duodenal sweep. The largest lesion was 8 mm in       largest dimension. No high risk stigmata for bleeding noted. Biopsies       were taken with a cold forceps for histology.      Diffuse  mucosal scalloping was found in some areas the second portion of       the duodenum. Biopsies were taken with a cold forceps for histology.      The exam of the duodenum was otherwise normal. Impression:               - Esophagogastric landmarks identified.                           - 5 cm hiatal hernia.                           - Severe esophagitis with no bleeding. Biopsied x 1.                           - Non-bleeding gastric ulcer with no stigmata of                            bleeding.                           - Numerous duodenal ulcers with no stigmata of                            bleeding. Biopsied.                           - Some scalloped mucosa was found in the duodenum.                            Biopsied.                           - Biopsies were taken with a cold forceps for                            Helicobacter pylori testing.                           Bleeding / anemia is the result of numerous ulcers                            and severe esophagitis. No high risk stigmata noted                            that would warrant endoscopic therapy. Moderate  Sedation:      No moderate sedation, case performed with MAC Recommendation:           - Return patient to hospital ward for ongoing care.                           - Liquid diet okay, advance as tolerated. Patient                            at risk for stricturing of the esophagus with                            healing of esophagitis, would maintain soft diet                            while she recovers.                           - Continue present medications.                           - Continue IV protonix 66m twice daily                           - Start liquid carafate 10cc every 6 hours as needed                           - Await pathology results.                           - Consideration for gastin level given endoscopic                            findings of numerous duodenal ulcers and severe                             esophagitis Procedure Code(s):        --- Professional ---                           [phone] Esophagogastroduodenoscopy, flexible,                            transoral; with biopsy, single or multiple Diagnosis Code(s):        --- Professional ---                           K44.9, Diaphragmatic hernia without obstruction or                            gangrene                           K20.90, Esophagitis, unspecified without bleeding                           K25.9, Gastric ulcer, unspecified as acute  or                            chronic, without hemorrhage or perforation                           K26.9, Duodenal ulcer, unspecified as acute or                            chronic, without hemorrhage or perforation                           K31.89, Other diseases of stomach and duodenum                           K92.1, Melena (includes Hematochezia) CPT copyright 2019 American Medical Association. All rights reserved. The codes documented in this report are preliminary and upon coder review may  be revised to meet current compliance requirements. Remo Lipps P. Laqueshia Cihlar, MD 06/15/2020 4:53:15 PM This report has been signed electronically. Number of Addenda: 0

## 2020-06-15 NOTE — Progress Notes (Signed)
5 Days Post-Op   Subjective/Chief Complaint: No complaints. Pt states she went into afib with rvr prior to endoscopy. Rate is controlled now.    Objective: Vital signs in last 24 hours: Temp:  [98.1 F (36.7 C)-98.6 F (37 C)] 98.2 F (36.8 C) (10/10 0528) Pulse Rate:  [73-187] 80 (10/10 0528) Resp:  [16-20] 16 (10/10 0528) BP: (85-141)/(50-99) 122/65 (10/10 0528) SpO2:  [96 %-100 %] 100 % (10/10 0528) Last BM Date: 06/11/20  Intake/Output from previous day: 10/09 0701 - 10/10 0700 In: 588.9 [P.O.:120; I.V.:29.4; IV Piggyback:439.4] Out: 705 [Urine:650; Drains:55] Intake/Output this shift: No intake/output data recorded.  General appearance: alert and cooperative Resp: clear to auscultation bilaterally Cardio: rate controlled GI: soft, nontender. drain output serosanguinous  Lab Results:  Recent Labs    06/14/20 0337 06/14/20 0337 06/14/20 1840 06/15/20 0544  WBC 12.2*  --   --  14.6*  HGB 8.5*   < > 8.5* 8.2*  HCT 26.2*   < > 26.8* 26.6*  PLT 251  --   --  321   < > = values in this interval not displayed.   BMET Recent Labs    06/14/20 0337 06/15/20 0544  NA 140 140  K 3.3* 3.5  CL 106 107  CO2 25 25  GLUCOSE 83 82  BUN 14 14  CREATININE 0.60 0.55  CALCIUM 7.8* 7.8*   PT/INR No results for input(s): LABPROT, INR in the last 72 hours. ABG No results for input(s): PHART, HCO3 in the last 72 hours.  Invalid input(s): PCO2, PO2  Studies/Results: DG Chest Port 1 View  Result Date: 06/14/2020 CLINICAL DATA:  Mild assist for 6 months. EXAM: PORTABLE CHEST 1 VIEW COMPARISON:  Chest radiograph 06/09/2020. FINDINGS: Monitoring leads overlie the patient. Stable cardiothymic scratch the stable cardiac and mediastinal contours. Similar-appearing biapical scarring. Similar-appearing cavitary lesion within the right upper lobe. Slight interval increase in peripheral consolidation left mid lung. No pleural effusion or pneumothorax. IMPRESSION: 1. Slight interval  increase in peripheral consolidation left mid lung, potentially superimposed infectious process. 2. Similar-appearing cavitary lesion right upper lobe. Electronically Signed   By: Annia Belt M.D.   On: 06/14/2020 13:09    Anti-infectives: Anti-infectives (From admission, onward)   Start     Dose/Rate Route Frequency Ordered Stop   06/12/20 0000  cefdinir (OMNICEF) 300 MG capsule        300 mg Oral 2 times daily 06/12/20 0758 06/17/20 2359   06/09/20 1330  hydroxychloroquine (PLAQUENIL) tablet 200 mg        200 mg Oral 2 times daily 06/09/20 0829     06/09/20 1330  voriconazole (VFEND) tablet 200 mg        200 mg Oral Every 12 hours 06/09/20 0829     06/09/20 0700  ceFEPIme (MAXIPIME) 2 g in sodium chloride 0.9 % 100 mL IVPB        2 g 200 mL/hr over 30 Minutes Intravenous Every 8 hours 06/09/20 0635     06/09/20 0700  metroNIDAZOLE (FLAGYL) IVPB 500 mg        500 mg 100 mL/hr over 60 Minutes Intravenous Every 6 hours 06/09/20 0635     06/09/20 0615  cefTRIAXone (ROCEPHIN) 1 g in sodium chloride 0.9 % 100 mL IVPB  Status:  Discontinued        1 g 200 mL/hr over 30 Minutes Intravenous  Once 06/09/20 0611 06/09/20 0700      Assessment/Plan: s/p Procedure(s): LAPAROSCOPIC CHOLECYSTECTOMY (N/A) Advance  diet once endoscopy is done Cardiac eval for new onset afib Will likely remove drain prior to d/c if she stays another day  LOS: 6 days    Chevis Pretty III 06/15/2020

## 2020-06-15 NOTE — Anesthesia Postprocedure Evaluation (Signed)
Anesthesia Post Note  Patient: ELLAR HAKALA  Procedure(s) Performed: ESOPHAGOGASTRODUODENOSCOPY (EGD) WITH PROPOFOL (N/A ) BIOPSY     Patient location during evaluation: PACU Anesthesia Type: MAC Level of consciousness: awake and alert Pain management: pain level controlled Vital Signs Assessment: post-procedure vital signs reviewed and stable Respiratory status: spontaneous breathing, nonlabored ventilation, respiratory function stable and patient connected to nasal cannula oxygen Cardiovascular status: stable and blood pressure returned to baseline Postop Assessment: no apparent nausea or vomiting Anesthetic complications: no   No complications documented.  Last Vitals:  Vitals:   06/15/20 1734 06/15/20 2146  BP: (!) 122/57 (!) 109/55  Pulse: 77 73  Resp: 18 16  Temp: 36.5 C 36.8 C  SpO2: 100% 100%    Last Pain:  Vitals:   06/15/20 2146  TempSrc: Oral  PainSc:                  Tiajuana Amass

## 2020-06-15 NOTE — Anesthesia Preprocedure Evaluation (Signed)
Anesthesia Evaluation  Patient identified by MRN, date of birth, ID band Patient awake    Reviewed: Allergy & Precautions, NPO status , Patient's Chart, lab work & pertinent test results  Airway Mallampati: II  TM Distance: >3 FB Neck ROM: Full    Dental  (+) Dental Advisory Given   Pulmonary COPD, former smoker,    breath sounds clear to auscultation       Cardiovascular + CAD and +CHF   Rhythm:Regular Rate:Normal     Neuro/Psych  Neuromuscular disease    GI/Hepatic Neg liver ROS, hiatal hernia,   Endo/Other  negative endocrine ROS  Renal/GU negative Renal ROS     Musculoskeletal  (+) Arthritis , Rheumatoid disorders,    Abdominal   Peds  Hematology  (+) anemia ,   Anesthesia Other Findings   Reproductive/Obstetrics                             Anesthesia Physical Anesthesia Plan  ASA: III  Anesthesia Plan: MAC   Post-op Pain Management:    Induction:   PONV Risk Score and Plan: 2 and Propofol infusion, Ondansetron and Treatment may vary due to age or medical condition  Airway Management Planned: Natural Airway and Nasal Cannula  Additional Equipment:   Intra-op Plan:   Post-operative Plan:   Informed Consent: I have reviewed the patients History and Physical, chart, labs and discussed the procedure including the risks, benefits and alternatives for the proposed anesthesia with the patient or authorized representative who has indicated his/her understanding and acceptance.       Plan Discussed with: CRNA  Anesthesia Plan Comments:         Anesthesia Quick Evaluation

## 2020-06-15 NOTE — Interval H&P Note (Signed)
History and Physical Interval Note:  06/15/2020 3:28 PM  Kimberly Shelton  has presented today for surgery, with the diagnosis of Melena.  The various methods of treatment have been discussed with the patient and family. After consideration of risks, benefits and other options for treatment, the patient has consented to  Procedure(s): ESOPHAGOGASTRODUODENOSCOPY (EGD) WITH PROPOFOL (N/A) as a surgical intervention.  The patient's history has been reviewed, patient examined, no change in status, stable for surgery.  I have reviewed the patient's chart and labs.  Questions were answered to the patient's satisfaction.     Viviann Spare P Naevia Unterreiner

## 2020-06-15 NOTE — Progress Notes (Signed)
PROGRESS NOTE    Kimberly TRELOAR  ZOX:[id number] DOB: [birth year] DOA: 06/09/2020 PCP: Kristian Covey, MD   Chief Complaint  Patient presents with  . Abdominal Pain    Brief Narrative: As per previous attending : 59YoF w/  RA, on Plaquenil and low-dose prednisone, ILD/RUL cavitary lesion thought to be aspergillosis with chronic cough and hemoptysis being managed by pulmonary currently on voriconazole,diastolic CHF and tobacco use presenting with lower abdominal pain and vomiting for 1 day, and found to have  cholelithiasis, acute cholecystitis and pancreatic duct dilation to 6 mm in the head of pancreas.  LFT and lipase within normal.MRCP confirms cholelithiasis with suspected acute cholecystitis, and also CBD to 11 mm with suspected 5 mm distal CBD stone at the ampulla.  Seen by pulmonary, surgery. S/p laparoscopic cholecystectomy 06/10/2020 Dr.Toth. Postop doing well.  Abdomen is soft pain controlled. Hemoglobin 7.0 g likely multifactorial-from chronic hemodialysis postop and hemodilution.  1 unit PRBC transfused-h/h stabilized 06/14/20: She had  new onset A. fib with RVR also hypotensive-received 500 ml saline bolus and Cardizem given and subsequently converted to normal sinus rhythm within few hours and transitioned to oral Cardizem  Subjective:  Reports of blackish stool.  Waiting for endoscopy today. Remains in normal sinus rhythm.  Assessment & Plan:  Acute gangrenous cholecystitis with cholelithiasis: s/p laparoscopic cholecystectomy 06/10/2028 by Dr. Carolynne Edouard.  Postop doing well-continue antibiotics as per surgery team, has a drain in place and will be removed tomorrow by surgery.  WBC fluctuating.  Monitor.  She is afebrile. Recent Labs  Lab 06/11/20 0343 06/12/20 0318 06/13/20 0729 06/14/20 0337 06/15/20 0544  WBC 18.8* 16.9* 13.8* 12.2* 14.6*   New onset A. fib with RVR with hypotensioon in the setting of acute illness, gi bleed and post op status. S/p 500 ml saline bolus  and Cardizem drip and subsequently converted to normal sinus rhythm within couple of hours and transitioned to oral Cardizem.  Insert normal sinus rhythm.  Seen by cardiology, appreciate input and not planning for anticoagulation.  Follow-up as outpatient w/ cardio.  Acute on chronic anemia/coffee-ground emesis and black his stool FOBT positive consistent with ABLA: Anemia  likley multifactorial suspecting upper GI bleeding during of chronic steroid, s/p surgery and also with chronic hemoptysis: Waiting for endoscopy.  Continue PPI twice daily.    Recent Labs  Lab 06/12/20 1459 06/13/20 0729 06/14/20 0337 06/14/20 1840 06/15/20 0544  HGB 8.7* 8.9* 8.5* 8.5* 8.2*  HCT 27.0* 27.5* 26.2* 26.8* 26.6*   Hypophosphatemia:resolved. Hypokalemia: Resolved.   Interstitial lung disease:in the setting of autoimmune disease/RA; on chronic Plaquenil and prednisone.  Was on stress dose steroid here and now being tapered to oral prednisone. Aspergilloma with associated chronic bronchiectasis/?  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 [name] and seen by pulmonary while here.   Chronic hemoptysis since 2019 with aspergilloma/chronic bronchiectasis:She is being followed by Dr. Deatra Canter as outpatient and patient tells me that there is plan for her to go through IR procedure at some point in the future.    Chronic diastolic heart failure: Patient reports she got excessive fluid when she had pneumonia a few years ago and wanted to heart failure.  Remains euvolemic Diet Order            Diet NPO time specified Except for: Sips with Meds  Diet effective midnight                 Nutrition Problem: Inadequate oral intake Etiology: acute illness (  acute cholecystitis) Signs/Symptoms: per patient/family report Interventions: Refer to RD note for recommendations  Body mass index is 27.3 kg/m.  DVT prophylaxis: Place and maintain sequential compression device Start: 06/09/20  1404 SCDs Start: 06/09/20 0830 Code Status:   Code Status: Full Code  Family Communication: plan of care discussed with patient at bedside.  Status is: Inpatient Remains inpatient appropriate because:Inpatient level of care appropriate due to severity of illness and Ongoing monitoring of diet tolerance status post gallbladder surgery now with new onset rapid A. fib with RVR.  Dispo: The patient is from: Home.              Anticipated d/c is to: Home.              Anticipated d/c date is:  1-2 days once okay with GI.              Patient currently is not medically stable to d/c.   Consultants:see note  Procedures:see note  Culture/Microbiology    Component Value Date/Time   SDES  11/27/2019 1529    BRONCHIAL ALVEOLAR LAVAGE RUL Performed at [facility], [address] [place], [place] [address]    SPECREQUEST  11/27/2019 1529    Immunocompromised Performed at [facility], [address], [place], [place] [address]    CULT  11/27/2019 1529    FEW Consistent with normal respiratory flora. Performed at [facility], [address], [place], [place] [address]    REPTSTATUS 11/30/2019 FINAL 11/27/2019 1529    Other culture-see note  Medications: Scheduled Meds: . acetaminophen  1,000 mg Oral TID  . diltiazem  30 mg Oral Q6H  . escitalopram  10 mg Oral Daily  . hydroxychloroquine  200 mg Oral BID  . lip balm  1 application Topical BID  . pantoprazole (PROTONIX) IV  40 mg Intravenous Q12H  . predniSONE  2.5 mg Oral Q breakfast  . voriconazole  200 mg Oral Q12H   Continuous Infusions: . ceFEPime (MAXIPIME) IV 2 g (06/15/20 0950)  . metronidazole 500 mg (06/15/20 0950)  . ondansetron (ZOFRAN) IV      Antimicrobials: Anti-infectives (From admission, onward)   Start     Dose/Rate Route Frequency Ordered Stop   06/12/20 0000  cefdinir (OMNICEF) 300 MG capsule        300 mg Oral 2 times daily 06/12/20 0758 06/17/20 2359    06/09/20 1330  hydroxychloroquine (PLAQUENIL) tablet 200 mg        200 mg Oral 2 times daily 06/09/20 0829     06/09/20 1330  voriconazole (VFEND) tablet 200 mg        200 mg Oral Every 12 hours 06/09/20 0829     06/09/20 0700  ceFEPIme (MAXIPIME) 2 g in sodium chloride 0.9 % 100 mL IVPB        2 g 200 mL/hr over 30 Minutes Intravenous Every 8 hours 06/09/20 0635     06/09/20 0700  metroNIDAZOLE (FLAGYL) IVPB 500 mg        500 mg 100 mL/hr over 60 Minutes Intravenous Every 6 hours 06/09/20 0635     06/09/20 0615  cefTRIAXone (ROCEPHIN) 1 g in sodium chloride 0.9 % 100 mL IVPB  Status:  Discontinued        1 g 200 mL/hr over 30 Minutes Intravenous  Once 06/09/20 0611 06/09/20 0700     Objective: Vitals: Today's Vitals   06/14/20 2226 06/15/20 0154 06/15/20 0528 06/15/20 2585  BP: 120/60  122/65 118/60  Pulse: 78  80 81  Resp: 16  16   Temp: 98.2 F (36.8 C)  98.2 F (36.8 C)   TempSrc: Oral  Oral   SpO2:   100%   Weight:      Height:      PainSc:  0-No pain      Intake/Output Summary (Last 24 hours) at 06/15/2020 1207 Last data filed at 06/15/2020 1022 Gross per 24 hour  Intake 468.87 ml  Output 905 ml  Net -436.13 ml   Filed Weights   06/10/20 0500 06/13/20 0500 06/14/20 0500  Weight: 70.6 kg 70 kg 69.9 kg   Weight change:   Intake/Output from previous day: 10/09 0701 - 10/10 0700 In: 588.9 [P.O.:120; I.V.:29.4; IV Piggyback:439.4] Out: 705 [Urine:650; Drains:55] Intake/Output this shift: Total I/O In: 0  Out: 200 [Urine:200]  Examination:  General exam: AAOx3, NAD, weak appearing. HEENT:Oral mucosa moist, Ear/Nose WNL grossly, dentition normal. Respiratory system: bilaterally clear,no wheezing or crackles,no use of accessory muscle Cardiovascular system: S1 & S2 +, No JVD,. Gastrointestinal system: Abdomen soft, NT,ND, BS+abdominal drain in place with pinkish fluid. Nervous System:Alert, awake, moving extremities and grossly nonfocal Extremities: No  edema, distal peripheral pulses palpable.  Skin: No rashes,no icterus. MSK: Normal muscle bulk,tone, power   Data Reviewed: I have personally reviewed following labs and imaging studies CBC: Recent Labs  Lab 06/11/20 0343 06/11/20 0343 06/12/20 0318 06/12/20 0318 06/12/20 1459 06/13/20 0729 06/14/20 0337 06/14/20 1840 06/15/20 0544  WBC 18.8*  --  16.9*  --   --  13.8* 12.2*  --  14.6*  NEUTROABS 17.6*  --   --   --   --   --   --   --   --   HGB 7.8*   < > 7.0*   < > 8.7* 8.9* 8.5* 8.5* 8.2*  HCT 25.3*   < > 22.9*   < > 27.0* 27.5* 26.2* 26.8* 26.6*  MCV 95.5  --  93.9  --   --  89.9 88.2  --  92.7  PLT 232  --  269  --   --  250 251  --  321   < > = values in this interval not displayed.   Basic Metabolic Panel: Recent Labs  Lab 06/09/20 0116 06/09/20 0635 06/10/20 0336 06/11/20 0343 06/11/20 1801 06/12/20 0318 06/14/20 0337 06/15/20 0544  NA   < >  --  137 134*  --  140 140 140  K   < >  --  4.2 4.5  --  3.7 3.3* 3.5  CL   < >  --  105 105  --  109 106 107  CO2   < >  --  22 23  --  24 25 25   GLUCOSE   < >  --  98 143*  --  144* 83 82  BUN   < >  --  16 35*  --  30* 14 14  CREATININE   < >  --  0.66 0.93  --  0.72 0.60 0.55  CALCIUM   < >  --  8.2* 7.7*  --  7.6* 7.8* 7.8*  MG  --  1.9  --  2.4  --   --   --   --   PHOS  --  2.5  --  1.2* 2.5  --   --   --    < > = values in this interval  not displayed.   GFR: Estimated Creatinine Clearance: 70.1 mL/min (by C-G formula based on SCr of 0.55 mg/dL). Liver Function Tests: Recent Labs  Lab 06/09/20 0116 06/11/20 0343 06/12/20 0318  AST 18 36 19  ALT 12 28 25   ALKPHOS 126 179* 157*  BILITOT 0.6 0.7 0.5  PROT 7.8 5.3* 5.7*  ALBUMIN 3.8 2.3* 2.5*   Recent Labs  Lab 06/09/20 0116  LIPASE 21   No results for input(s): AMMONIA in the last 168 hours. Coagulation Profile: No results for input(s): INR, PROTIME in the last 168 hours. Cardiac Enzymes: No results for input(s): CKTOTAL, CKMB, CKMBINDEX,  TROPONINI in the last 168 hours. BNP (last 3 results) No results for input(s): PROBNP in the last 8760 hours. HbA1C: No results for input(s): HGBA1C in the last 72 hours. CBG: No results for input(s): GLUCAP in the last 168 hours. Lipid Profile: No results for input(s): CHOL, HDL, LDLCALC, TRIG, CHOLHDL, LDLDIRECT in the last 72 hours. Thyroid Function Tests: No results for input(s): TSH, T4TOTAL, FREET4, T3FREE, THYROIDAB in the last 72 hours. Anemia Panel: No results for input(s): VITAMINB12, FOLATE, FERRITIN, TIBC, IRON, RETICCTPCT in the last 72 hours. Sepsis Labs: No results for input(s): PROCALCITON, LATICACIDVEN in the last 168 hours.  Recent Results (from the past 240 hour(s))  Respiratory Panel by RT PCR (Flu A&B, Covid) - Nasopharyngeal Swab     Status: None   Collection Time: 06/09/20  6:03 AM   Specimen: Nasopharyngeal Swab  Result Value Ref Range Status   SARS Coronavirus 2 by RT PCR NEGATIVE NEGATIVE Final    Comment: (NOTE) SARS-CoV-2 target nucleic acids are NOT DETECTED.  The SARS-CoV-2 RNA is generally detectable in upper respiratoy specimens during the acute phase of infection. The lowest concentration of SARS-CoV-2 viral copies this assay can detect is 131 copies/mL. A negative result does not preclude SARS-Cov-2 infection and should not be used as the sole basis for treatment or other patient management decisions. A negative result may occur with  improper specimen collection/handling, submission of specimen other than nasopharyngeal swab, presence of viral mutation(s) within the areas targeted by this assay, and inadequate number of viral copies (<131 copies/mL). A negative result must be combined with clinical observations, patient history, and epidemiological information. The expected result is Negative.  Fact Sheet for Patients:  https://www.moore.com/  Fact Sheet for Healthcare Providers:   https://www.young.biz/  This test is no t yet approved or cleared by the [place] FDA and  has been authorized for detection and/or diagnosis of SARS-CoV-2 by FDA under an Emergency Use Authorization (EUA). This EUA will remain  in effect (meaning this test can be used) for the duration of the COVID-19 declaration under Section 564(b)(1) of the Act, 21 U.S.C. section 360bbb-3(b)(1), unless the authorization is terminated or revoked sooner.     Influenza A by PCR NEGATIVE NEGATIVE Final   Influenza B by PCR NEGATIVE NEGATIVE Final    Comment: (NOTE) The Xpert Xpress SARS-CoV-2/FLU/RSV assay is intended as an aid in  the diagnosis of influenza from Nasopharyngeal swab specimens and  should not be used as a sole basis for treatment. Nasal washings and  aspirates are unacceptable for Xpert Xpress SARS-CoV-2/FLU/RSV  testing.  Fact Sheet for Patients: https://www.moore.com/  Fact Sheet for Healthcare Providers: https://www.young.biz/  This test is not yet approved or cleared by the [place] FDA and  has been authorized for detection and/or diagnosis of SARS-CoV-2 by  FDA under an Emergency Use Authorization (EUA). This EUA will  remain  in effect (meaning this test can be used) for the duration of the  Covid-19 declaration under Section 564(b)(1) of the Act, 21  U.S.C. section 360bbb-3(b)(1), unless the authorization is  terminated or revoked. Performed at [facility], [address], [place], [place] [address]   MRSA PCR Screening     Status: None   Collection Time: 06/09/20 12:58 PM   Specimen: Nasal Mucosa; Nasopharyngeal  Result Value Ref Range Status   MRSA by PCR NEGATIVE NEGATIVE Final    Comment:        The GeneXpert MRSA Assay (FDA approved for NASAL specimens only), is one component of a comprehensive MRSA colonization surveillance program. It is not intended to diagnose  MRSA infection nor to guide or monitor treatment for MRSA infections. Performed at [facility], [address] [place], [place] [address]     Radiology Studies: [facility] Chest Port 1 View  Result Date: 06/14/2020 CLINICAL DATA:  Mild assist for 6 months. EXAM: PORTABLE CHEST 1 VIEW COMPARISON:  Chest radiograph 06/09/2020. FINDINGS: Monitoring leads overlie the patient. Stable cardiothymic scratch the stable cardiac and mediastinal contours. Similar-appearing biapical scarring. Similar-appearing cavitary lesion within the right upper lobe. Slight interval increase in peripheral consolidation left mid lung. No pleural effusion or pneumothorax. IMPRESSION: 1. Slight interval increase in peripheral consolidation left mid lung, potentially superimposed infectious process. 2. Similar-appearing cavitary lesion right upper lobe. Electronically Signed   By: Annia Belt M.D.   On: 06/14/2020 13:09     LOS: 6 days   Lanae Boast, MD [facility]  06/15/2020, 12:07 PM

## 2020-06-15 NOTE — Consult Note (Signed)
Cardiology Consultation:   Patient ID: Kimberly Shelton MRN: [id number]; DOB: 11-Jun-1961  Admit date: 06/09/2020 Date of Consult: 06/15/2020  Primary Care Provider: Kristian Covey, MD [facility] Cardiologist: [facility] Electrophysiologist:  None    Patient Profile:   Kimberly Shelton is a 59 y.o. female with a hx of RA, interstitial lung disease, diastolic heart failure, cigarette smoking who was admitted with cholecystitis.  Who is being seen today for the evaluation of  For further evaluation of rapid atrial fib  at the request of  Dr. Jonathon Shelton. Marland Kitchen She has had GI bleeding ( has been on prednisone ) and has a history of hemoptysis related to a chronic cough.  History of Present Illness:   Ms. Shelton is a 59 year old female with a .  She has interstitial lung disease thought to be due to aspergillosis.  She has a chronic cough and occasional episodes of hemoptysis.  She has been managed on Plaquenil and low-dose prednisone.  She has had rapid atrial fibrillation but also has had hypotension which is made treatment of her rapid rate somewhat difficult. Her last echocardiogram was January, 2021 which revealed normal left ventricular systolic function.  She has mild LVH.  She denies any chest pain .  She has chronic dyspnea.  Former smoker, quit 6 years ago     Past Medical History:  Diagnosis Date  . Abnormal Pap smear 09/2006   HGSIL CIN 2/VAIN/ CIN 3/VAIN-3 CIS  . CHF (congestive heart failure) (HCC)   . Foot fracture, left 10/16   hair-line fracture  . History of pneumonia 02/09/2016  . Pneumonia    was hospitalized  . RA (rheumatoid arthritis) (HCC) 07/2009    Past Surgical History:  Procedure Laterality Date  . BRONCHIAL WASHINGS  11/27/2019   Procedure: BRONCHIAL WASHINGS;  Surgeon: Kimberly Shan, MD;  Location: [facility];  Service: Cardiopulmonary;;  . CERVICAL BIOPSY  W/ LOOP ELECTRODE EXCISION  2008   CIN 3  . CERVICAL CONIZATION W/BX  N/A 12/15/2015   Procedure: CONIZATION CERVIX WITH BIOPSY ;  Surgeon: Kimberly Bears, MD;  Location: [facility];  Service: Gynecology;  Laterality: N/A;  POSSIBLE COLD KNIFE CONE.  Please have the microscope in the room  . CHOLECYSTECTOMY N/A 06/10/2020   Procedure: LAPAROSCOPIC CHOLECYSTECTOMY;  Surgeon: Kimberly Miner, MD;  Location: [facility];  Service: General;  Laterality: N/A;  . COLONOSCOPY    . IR RADIOLOGIST EVAL & MGMT  05/27/2020  . LEEP N/A 12/15/2015   Procedure: LOOP ELECTROSURGICAL EXCISION PROCEDURE (LEEP) with colpo;  Surgeon: Kimberly Bears, MD;  Location: [facility];  Service: Gynecology;  Laterality: N/A;  . PILONIDAL CYST EXCISION  1999   I&D  . VIDEO BRONCHOSCOPY N/A 11/27/2019   Procedure: VIDEO BRONCHOSCOPY WITHOUT FLUORO;  Surgeon: Kimberly Shan, MD;  Location: [facility];  Service: Cardiopulmonary;  Laterality: N/A;  . WISDOM TOOTH EXTRACTION       Home Medications:  Prior to Admission medications   Medication Sig Start Date End Date Taking? Authorizing Provider  albuterol (VENTOLIN HFA) 108 (90 Base) MCG/ACT inhaler INHALE 2 PUFFS INTO THE LUNGS EVERY 6 HOURS AS NEEDED FOR WHEEZING OR SHORTNESS OF BREATH Patient taking differently: Inhale 2 puffs into the lungs every 6 (six) hours as needed for wheezing or shortness of breath.  11/07/19  Yes Ramaswamy, Carmin Muskrat, MD  benzonatate (TESSALON) 200 MG capsule TAKE 1 CAPSULE(200 MG) BY MOUTH THREE TIMES DAILY AS NEEDED FOR COUGH Patient taking differently: Take 200  mg by mouth 3 (three) times daily as needed for cough. TAKE 1 CAPSULE(200 MG) BY MOUTH THREE TIMES DAILY AS NEEDED FOR COUGH 12/10/19  Yes Kimberly Bayley, NP  escitalopram (LEXAPRO) 10 MG tablet TAKE 1 TABLET BY MOUTH EVERY DAY Patient taking differently: Take 10 mg by mouth daily.  05/13/20  Yes Kimberly Bears, MD  hydroxychloroquine (PLAQUENIL) 200 MG tablet Take 200 mg by mouth 2 (two) times daily. 10/17/19  Yes [provider]  predniSONE (DELTASONE) 5 MG tablet  Take 2.5 mg by mouth daily with breakfast.  07/10/19  Yes [provider]  voriconazole (VFEND) 200 MG tablet TAKE 1 TABLET(200 MG) BY MOUTH TWICE DAILY Patient taking differently: Take 200 mg by mouth 2 (two) times daily.  01/28/20  Yes Comer, Belia Heman, MD  acetaminophen (TYLENOL) 325 MG tablet Take 2 tablets (650 mg total) by mouth every 6 (six) hours as needed for mild pain (or Fever >/= 101). 06/12/20   Kimberly Rude, PA-C  cefdinir (OMNICEF) 300 MG capsule Take 1 capsule (300 mg total) by mouth 2 (two) times daily for 5 days. 06/12/20 06/17/20  Kimberly Rude, PA-C  EPINEPHrine (EPIPEN 2-PAK) 0.3 mg/0.3 mL IJ SOAJ injection Inject 0.3 mg into the muscle as needed for anaphylaxis.     [provider]  HYDROcodone-homatropine (HYCODAN) 5-1.5 MG/5ML syrup Take 5 mLs by mouth every 6 (six) hours as needed for cough. Patient not taking: Reported on 06/09/2020 04/09/20   Kimberly Bayley, NP  Nintedanib (OFEV) 150 MG CAPS Take 1 capsule (150 mg total) by mouth 2 (two) times daily. Patient not taking: Reported on 06/09/2020 02/05/20   Kimberly Shan, MD  ondansetron (ZOFRAN) 4 MG tablet Take 1 tablet (4 mg total) by mouth 3 (three) times daily as needed for nausea. Patient not taking: Reported on 06/09/2020 01/10/20   Kimberly Shan, MD  oxyCODONE (OXY IR/ROXICODONE) 5 MG immediate release tablet Take 1 tablet (5 mg total) by mouth every 4 (four) hours as needed for moderate pain. 06/12/20   Kimberly Rude, PA-C    Inpatient Medications: Scheduled Meds: . acetaminophen  1,000 mg Oral TID  . diltiazem  30 mg Oral Q6H  . escitalopram  10 mg Oral Daily  . hydroxychloroquine  200 mg Oral BID  . lip balm  1 application Topical BID  . pantoprazole (PROTONIX) IV  40 mg Intravenous Q12H  . predniSONE  2.5 mg Oral Q breakfast  . voriconazole  200 mg Oral Q12H   Continuous Infusions: . ceFEPime (MAXIPIME) IV 2 g (06/15/20 0001)  . metronidazole 500 mg (06/15/20 0333)  .  ondansetron (ZOFRAN) IV     PRN Meds: acetaminophen **OR** acetaminophen, alum & mag hydroxide-simeth, bisacodyl, diphenhydrAMINE, HYDROcodone-homatropine, magic mouthwash, menthol-cetylpyridinium, methocarbamol, metoprolol tartrate, morphine injection, ondansetron (ZOFRAN) IV **OR** ondansetron (ZOFRAN) IV, oxyCODONE, phenol, prochlorperazine  Allergies:    Allergies  Allergen Reactions  . Bee Venom Anaphylaxis  . Codeine Other (See Comments)    "seeing spots"  . Pirfenidone Other (See Comments)    GI side effects  . Penicillins Rash    Has patient had a PCN reaction causing immediate rash, facial/tongue/throat swelling, SOB or lightheadedness with hypotension: No Has patient had a PCN reaction causing severe rash involving mucus membranes or skin necrosis: No Has patient had a PCN reaction that required hospitalization No Has patient had a PCN reaction occurring within the last 10 years: Yes If all of the above answers are "NO", then may proceed  with Cephalosporin use.     Social History:   Social History   Socioeconomic History  . Marital status: Divorced    Spouse name: Not on file  . Number of children: 1  . Years of education: Not on file  . Highest education level: Not on file  Occupational History  . Occupation: Stage manager: [employer]  Tobacco Use  . Smoking status: Former Smoker    Packs/day: 1.00    Years: 30.00    Pack years: 30.00    Types: Cigarettes, E-cigarettes    Start date: 1985    Quit date: 02/16/2014    Years since quitting: 6.3  . Smokeless tobacco: Never Used  Vaping Use  . Vaping Use: Every day  Substance and Sexual Activity  . Alcohol use: No    Alcohol/week: 0.0 standard drinks  . Drug use: No  . Sexual activity: Not Currently    Partners: Male    Birth control/protection: Post-menopausal  Other Topics Concern  . Not on file  Social History Narrative   Building control surveyor at [employer] [place]   Social Determinants of  Health   Financial Resource Strain:   . Difficulty of Paying Living Expenses: Not on file  Food Insecurity:   . Worried About Programme researcher, broadcasting/film/video in the Last Year: Not on file  . Ran Out of Food in the Last Year: Not on file  Transportation Needs:   . Lack of Transportation (Medical): Not on file  . Lack of Transportation (Non-Medical): Not on file  Physical Activity:   . Days of Exercise per Week: Not on file  . Minutes of Exercise per Session: Not on file  Stress:   . Feeling of Stress : Not on file  Social Connections:   . Frequency of Communication with Friends and Family: Not on file  . Frequency of Social Gatherings with Friends and Family: Not on file  . Attends Religious Services: Not on file  . Active Member of Clubs or Organizations: Not on file  . Attends Banker Meetings: Not on file  . Marital Status: Not on file  Intimate Partner Violence:   . Fear of Current or Ex-Partner: Not on file  . Emotionally Abused: Not on file  . Physically Abused: Not on file  . Sexually Abused: Not on file    Family History:    Family History  Problem Relation Age of Onset  . Diabetes Mother   . Rheum arthritis Mother   . Diabetes Father   . COPD Father   . Bronchitis Father   . Cancer Brother        Lung  . Breast cancer Maternal Aunt 55  . CAD Neg Hx      ROS:  Please see the history of present illness.   All other ROS reviewed and negative.     Physical Exam/Data:   Vitals:   06/14/20 1432 06/14/20 2044 06/14/20 2226 06/15/20 0528  BP: (!) 107/51 112/61 120/60 122/65  Pulse: 77 73 78 80  Resp:  Temp:  98.1 F (36.7 C) 98.2 F (36.8 C) 98.2 F (36.8 C)  TempSrc:  Oral Oral Oral  SpO2:  100%  100%  Weight:      Height:        Intake/Output Summary (Last 24 hours) at 06/15/2020 0850 Last data filed at 06/15/2020 0825 Gross per 24 hour  Intake 588.87 ml  Output  805 ml  Net -216.13 ml   Last 3 Weights 06/14/2020 06/13/2020 06/10/2020   Weight (lbs) 154 lb 1.6 oz 154 lb 5.2 oz 155 lb 10.3 oz  Weight (kg) 69.9 kg 70 kg 70.6 kg     Body mass index is 27.3 kg/m.  General:  Well nourished, well developed, in no acute distress HEENT: normal Lymph: no adenopathy Neck: no JVD Endocrine:  No thryomegaly Vascular: bilat soft carotid bruits vs. Radiation of her systolic murmur   Cardiac:   Irreg. IRreg.   2/6 systiolic murmur radiating up to the carotids  Lungs:  clear to auscultation bilaterally, no wheezing, rhonchi or rales  Abd: soft, nontender, no hepatomegaly  Ext: no edema Musculoskeletal:  No deformities, BUE and BLE strength normal and equal Skin: warm and dry  Neuro:  CNs 2-12 intact, no focal abnormalities noted Psych:  Normal affect   EKG:  The EKG was personally reviewed and demonstrates:   06/14/20 Rapid AF with HR of 161   Telemetry:  Telemetry was personally reviewed and demonstrates:  NSR ,  Occasional PAC. Yesterday , she had Afib with RVR   Relevant CV Studies:   Laboratory Data:  High Sensitivity Troponin:  No results for input(s): TROPONINIHS in the last 720 hours.   Chemistry Recent Labs  Lab 06/09/20 0116 06/09/20 0116 06/10/20 0336 06/11/20 0343 06/12/20 0318 06/14/20 0337 06/15/20 0544  NA 136   < > 137   < > 140 140 140  K 4.1   < > 4.2   < > 3.7 3.3* 3.5  CL 97*   < > 105   < > 109 106 107  CO2 27   < > 22   < > 24 25 25   GLUCOSE 162*   < > 98   < > 144* 83 82  BUN 15   < > 16   < > 30* 14 14  CREATININE 0.71   < > 0.66   < > 0.72 0.60 0.55  CALCIUM 9.3   < > 8.2*   < > 7.6* 7.8* 7.8*  GFRNONAA >60   < > >60   < > >60 >60 >60  GFRAA >60  --  >60  --   --   --   --   ANIONGAP 12   < > 10   < > 7 9 8    < > = values in this interval not displayed.    Recent Labs  Lab 06/09/20 0116 06/11/20 0343 06/12/20 0318  PROT 7.8 5.3* 5.7*  ALBUMIN 3.8 2.3* 2.5*  AST 18 36 19  ALT 12 28 25   ALKPHOS 126 179* 157*  BILITOT 0.6 0.7 0.5   Hematology Recent Labs  Lab 06/13/20 0729  06/13/20 0729 06/14/20 0337 06/14/20 1840 06/15/20 0544  WBC 13.8*  --  12.2*  --  14.6*  RBC 3.06*  --  2.97*  --  2.87*  HGB 8.9*   < > 8.5* 8.5* 8.2*  HCT 27.5*   < > 26.2* 26.8* 26.6*  MCV 89.9  --  88.2  --  92.7  MCH 29.1  --  28.6  --  28.6  MCHC 32.4  --  32.4  --  30.8  RDW 16.5*  --  16.0*  --  15.9*  PLT 250  --  251  --  321   < > = values in this interval not displayed.   BNPNo results for input(s): BNP, PROBNP in the last 168  hours.  DDimer No results for input(s): DDIMER in the last 168 hours.   Radiology/Studies:  DG Chest Port 1 View  Result Date: 06/14/2020 CLINICAL DATA:  Mild assist for 6 months. EXAM: PORTABLE CHEST 1 VIEW COMPARISON:  Chest radiograph 06/09/2020. FINDINGS: Monitoring leads overlie the patient. Stable cardiothymic scratch the stable cardiac and mediastinal contours. Similar-appearing biapical scarring. Similar-appearing cavitary lesion within the right upper lobe. Slight interval increase in peripheral consolidation left mid lung. No pleural effusion or pneumothorax. IMPRESSION: 1. Slight interval increase in peripheral consolidation left mid lung, potentially superimposed infectious process. 2. Similar-appearing cavitary lesion right upper lobe. Electronically Signed   By: Annia Belt M.D.   On: 06/14/2020 13:09     Assessment and Plan:   1. Paroxysmal atrial fibrillation: The patient developed atrial fibrillation in the setting of anemia, postoperative state, GI bleed and other stresses.  She has already converted back to sinus rhythm and is now getting Cardizem 30 mg every 6 hours.  Given the short duration of atrial relation and the fact that it occurred during a stressful hospitalization with postoperative complications, I do not think that she necessarily needs to be started on oral anticoagulation at this point.  We can continue to follow her as an outpatient.  She sees Dr. Clifton James and will follow up with him on a regular basis.  We will  continue to look for any signs of recurrent AF .  I would continue Cardizem at the current dose.  2.  GI bleeding: She is having melena.  She is scheduled for endoscopy today.  She is at low risk from a cardiac standpoint to have her endoscopy.        :[id number]}  CHA2DS2-VASc Score =   2 ( female, diastolic chf)  This indicates a  % annual risk of stroke. The patient's score is based upon:       [facility] will sign off.   Medication Recommendations:  Cont current meds  Other recommendations (labs, testing, etc):   Follow up as an outpatient:  With Dr. Clifton James   For questions or updates, please contact [facility] Please consult [email or web address] for contact info under    Signed, Kristeen Miss, MD  06/15/2020 8:50 AM

## 2020-06-16 ENCOUNTER — Other Ambulatory Visit (HOSPITAL_COMMUNITY): Payer: 59

## 2020-06-16 ENCOUNTER — Encounter (HOSPITAL_COMMUNITY): Payer: Self-pay | Admitting: Gastroenterology

## 2020-06-16 LAB — BASIC METABOLIC PANEL
Anion gap: 9 (ref 5–15)
BUN: 12 mg/dL (ref 6–20)
CO2: 25 mmol/L (ref 22–32)
Calcium: 7.7 mg/dL — ABNORMAL LOW (ref 8.9–10.3)
Chloride: 105 mmol/L (ref 98–111)
Creatinine, Ser: 0.59 mg/dL (ref 0.44–1.00)
GFR, Estimated: >60 mL/min (ref >60)
Glucose, Bld: 69 mg/dL — ABNORMAL LOW (ref 70–99)
Potassium: 3.5 mmol/L (ref 3.5–5.1)
Sodium: 139 mmol/L (ref 135–145)

## 2020-06-16 LAB — TYPE AND SCREEN
ABO/RH(D): A POS
Antibody Screen: NEGATIVE
Unit division: 0
Unit division: 0

## 2020-06-16 LAB — CBC
HCT: 27 % — ABNORMAL LOW (ref 36.0–46.0)
Hemoglobin: 8.2 g/dL — ABNORMAL LOW (ref 12.0–15.0)
MCH: 28 pg (ref 26.0–34.0)
MCHC: 30.4 g/dL (ref 30.0–36.0)
MCV: 92.2 fL (ref 80.0–100.0)
Platelets: 366 10*3/uL (ref 150–400)
RBC: 2.93 MIL/uL — ABNORMAL LOW (ref 3.87–5.11)
RDW: 15.9 % — ABNORMAL HIGH (ref 11.5–15.5)
WBC: 13 10*3/uL — ABNORMAL HIGH (ref 4.0–10.5)
nRBC: 0.9 % — ABNORMAL HIGH (ref 0.0–0.2)

## 2020-06-16 LAB — BPAM RBC
Blood Product Expiration Date: 202110212359
Blood Product Expiration Date: 202110312359
ISSUE DATE / TIME: 202110071048
Unit Type and Rh: 6200
Unit Type and Rh: 6200

## 2020-06-16 MED ORDER — CEFDINIR 300 MG PO CAPS
300.0000 mg | ORAL_CAPSULE | Freq: Two times a day (BID) | ORAL | 0 refills | Status: DC
Start: 2020-06-16 — End: 2020-06-16

## 2020-06-16 MED ORDER — FERROUS SULFATE 325 (65 FE) MG PO TBEC: 325.0000 mg | DELAYED_RELEASE_TABLET | Freq: Two times a day (BID) | ORAL | 0 refills | Status: DC

## 2020-06-16 MED ORDER — SUCRALFATE 1 GM/10ML PO SUSP: 1.0000 g | Freq: Three times a day (TID) | ORAL | 0 refills | Status: DC

## 2020-06-16 MED ORDER — DOCUSATE SODIUM 100 MG PO CAPS: 100.0000 mg | ORAL_CAPSULE | Freq: Every day | ORAL | 2 refills | Status: DC | PRN

## 2020-06-16 MED ORDER — DILTIAZEM HCL ER COATED BEADS 120 MG PO CP24: 120.0000 mg | ORAL_CAPSULE | Freq: Every day | ORAL | 1 refills | Status: DC

## 2020-06-16 MED ORDER — CEFDINIR 300 MG PO CAPS: 300.0000 mg | ORAL_CAPSULE | Freq: Two times a day (BID) | ORAL | 0 refills | Status: AC

## 2020-06-16 MED ORDER — PANTOPRAZOLE SODIUM 40 MG PO TBEC: 40.0000 mg | DELAYED_RELEASE_TABLET | Freq: Two times a day (BID) | ORAL | 1 refills | Status: DC

## 2020-06-16 MED ORDER — PREDNISONE 5 MG PO TABS: 2.5000 mg | ORAL_TABLET | Freq: Every day | ORAL | Status: DC

## 2020-06-16 NOTE — TOC Progression Note (Signed)
Transition of Care [facility]) - Progression Note    Patient Details  Name: Kimberly Shelton MRN: [id number] Date of Birth: October 11, 1960  Transition of Care [facility] CM/SW Contact  Geni Bers, RN Phone Number: 06/16/2020, 11:52 AM  Clinical Narrative:    Pt from home alone with family support. TOC will continue to follow for Home Health needs.    Expected Discharge Plan: Home/Self Care Barriers to Discharge: No Barriers Identified  Expected Discharge Plan and Services Expected Discharge Plan: Home/Self Care       Living arrangements for the past 2 months: Single Family Home                                       Social Determinants of Health (SDOH) Interventions    Readmission Risk Interventions No flowsheet data found.

## 2020-06-16 NOTE — Discharge Summary (Addendum)
Physician Discharge Summary  Kimberly Shelton HYW:[id number] DOB: 11/22/1960 DOA: 06/09/2020  PCP: Kristian Covey, MD  Admit date: 06/09/2020 Discharge date: 06/16/2020  Admitted From: home Disposition:  home  Recommendations for Outpatient Follow-up:  1. Follow up with PCP in 1-2 weeks 2. Please obtain BMP/CBC in one week 3. Please follow up on the following pending results:biopsy  Home Health:no  Equipment/Devices: none  Discharge Condition: Stable Code Status:   Code Status: Full Code Diet recommendation:  Diet Order            DIET SOFT Room service appropriate? 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 Seen by pulmonary, surgery. S/p laparoscopic cholecystectomy 06/10/2020 Dr.Toth. Postop doing well.  Abdomen is soft pain controlled. Hemoglobin 7.0 g likely multifactorial-from chronic hemodialysis postop and hemodilution.  1 unit PRBC transfused-h/h stabilized 06/14/20: She had  new onset A. fib with RVR also hypotensive-received 500 ml saline bolus and Cardizem given and subsequently converted to normal sinus rhythm within few hours and transitioned to oral Cardizem Underwent EGD by GI 06/15/2020-showed severe esophagitis, nonbleeding gastric ulcer, numerous duodenal ulcers-biopsies were taken advised to continue diet and stick  to soft diet for several days and follow-up with GI as outpatient, continue on PPI. Agree to monitor and remained stable.  Patient remains in normal sinus rhythm.  Seen by cardiology holding off on anticoagulation due to brief A. fib and will be followed up as outpatient.  Okay to discharge home on current Cardizem dose.  She will need further eval as Op with echocardiogram etc, last echo jan 2021 normal lvef, as per cardiology.   Discharge Diagnoses:   Acute gangrenous cholecystitis with cholelithiasis: s/p laparoscopic cholecystectomy 06/10/2028 by Dr. Carolynne Edouard.  Postop doing well-drain removed today.  She will follow-up with surgery as outpatient.  She can continue 2 more days of antibiotics and stop.  New onset A. fib with RVR with hypotensioon in the setting of acute illness, gi bleed and post op status. S/p 500 ml saline bolus and Cardizem drip and subsequently converted to normal sinus rhythm within couple of hours and transitioned to oral Cardizem per card, cont 120mg  daily with holding parameters.  Per cardiology no need for anticoagulation until follow-up outpatient.  Acute on chronic anemia/coffee-ground emesis and black his stool FOBT positive consistent with ABLA: Underwent EGD by GI 06/15/2020-showed severe esophagitis, nonbleeding gastric ulcer, numerous duodenal ulcers-biopsies were taken advised to continue diet and stick to soft diet for several days and follow-up with GI as outpatient, continue on PPI. Has chronic hemoptysis: She will continue on PPI twice daily and soft diet.   Hypophosphatemia:resolved. Hypokalemia: Resolved.   Interstitial lung disease:in the setting of autoimmune disease/RA; on chronic Plaquenil and prednisone.  Was on stress dose steroid here and now back down to home oral prednisone. Aspergilloma with  associated chronic bronchiectasis/?  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 [name] and seen by pulmonary while here.   Chronic  hemoptysis since 2019 with aspergilloma/chronic bronchiectasis:She is being followed by pulmonary as outpatient and patient tells me that there is plan for her to go through IR procedure at some point in the future.    Chronic diastolic heart failure: Patient reports she got excessive fluid when she had pneumonia a few years ago and wanted to heart failure.  Remains euvolemic.  Consults:  CCS, GI, Cardiology  Subjective: Alert,awake,resting well. Tolerating diet.  Discharge Exam: Vitals:   06/15/20 2333 06/16/20 0515  BP: 111/67 (!) 112/56  Pulse: 75 74  Resp:  16  Temp:  98.1 F (36.7 C)  SpO2:  100%   General: Pt is alert, awake, not in acute distress Cardiovascular: RRR, S1/S2 +, no rubs, no gallops Respiratory: CTA bilaterally, no wheezing, no rhonchi Abdominal: Soft, NT, ND, bowel sounds + Extremities: no edema, no cyanosis  Discharge Instructions  Discharge Instructions    Diet - low sodium heart healthy   Complete by: As directed    Soft diet   Discharge instructions   Complete by: As directed    Please call call MD or return to ER for similar or worsening recurring problem that brought you to hospital or if any fever,nausea/vomiting,abdominal pain, uncontrolled pain, chest pain,  shortness of breath or any other alarming symptoms.  Please follow-up your doctor as instructed in a week time and call the office for appointment.  Please avoid alcohol, smoking, or any other illicit substance and maintain healthy habits including taking your regular medications as prescribed.  You were cared for by a hospitalist during your hospital stay. If you have any questions about your discharge medications or the care you received while you were in the hospital after you are discharged, you can call the unit and ask to speak with the hospitalist on call if the hospitalist that took care of you is not available.  Once you are discharged, your primary care physician will handle any  further medical issues. Please note that NO REFILLS for any discharge medications will be authorized once you are discharged, as it is imperative that you return to your primary care physician (or establish a relationship with a primary care physician if you do not have one) for your aftercare needs so that they can reassess your need for medications and monitor your lab values   Discharge instructions   Complete by: As directed    Please call call MD or return to ER for similar or worsening recurring problem that brought you to hospital or if any fever,nausea/vomiting,abdominal pain, uncontrolled pain, chest pain,  shortness of breath or any other alarming symptoms.  Follow-up with your GI doctor for your biopsy results.  Follow-up with your cardiologist soon  Please follow-up your doctor as instructed in a week time and call the office for appointment.  Please avoid alcohol, smoking, or any other illicit substance and maintain healthy habits including taking your regular medications as prescribed.  You were cared for by a hospitalist during your hospital stay. If you have any questions about your discharge medications or the care you received while you were in the hospital after you are discharged, you can call the unit and ask to speak with the hospitalist on call if the hospitalist that took care of you is not available.  Once you are discharged, your primary care physician will handle any  further medical issues. Please note that NO REFILLS for any discharge medications will be authorized once you are discharged, as it is imperative that you return to your primary care physician (or establish a relationship with a primary care physician if you do not have one) for your aftercare needs so that they can reassess your need for medications and monitor your lab values   If the dressing is still on your incision site when you go home, remove it on the third day after your surgery date. Remove dressing if  it begins to fall off, or if it is dirty or damaged before the third day.   Complete by: As directed    If the dressing is still on your incision site when you go home, remove it on the third day after your surgery date. Remove dressing if it begins to fall off, or if it is dirty or damaged before the third day.   Complete by: As directed    Increase activity slowly   Complete by: As directed    Increase activity slowly   Complete by: As directed      Allergies as of 06/16/2020      Reactions   Bee Venom Anaphylaxis   Codeine Other (See Comments)   "seeing spots"   Pirfenidone Other (See Comments)   GI side effects   Penicillins Rash   Has patient had a PCN reaction causing immediate rash, facial/tongue/throat swelling, SOB or lightheadedness with hypotension: No Has patient had a PCN reaction causing severe rash involving mucus membranes or skin necrosis: No Has patient had a PCN reaction that required hospitalization No Has patient had a PCN reaction occurring within the last 10 years: Yes If all of the above answers are "NO", then may proceed with Cephalosporin use.      Medication List    STOP taking these medications   HYDROcodone-homatropine 5-1.5 MG/5ML syrup Commonly known as: HYCODAN   Ofev 150 MG Caps Generic drug: Nintedanib     TAKE these medications   acetaminophen 325 MG tablet Commonly known as: TYLENOL Take 2 tablets (650 mg total) by mouth every 6 (six) hours as needed for mild pain (or Fever >/= 101).   albuterol 108 (90 Base) MCG/ACT inhaler Commonly known as: VENTOLIN HFA INHALE 2 PUFFS INTO THE LUNGS EVERY 6 HOURS AS NEEDED FOR WHEEZING OR SHORTNESS OF BREATH What changed: See the new instructions.   benzonatate 200 MG capsule Commonly known as: TESSALON TAKE 1 CAPSULE(200 MG) BY MOUTH THREE TIMES DAILY AS NEEDED FOR COUGH What changed:   how much to take  how to take this  when to take this  reasons to take this   cefdinir 300 MG  capsule Commonly known as: OMNICEF Take 1 capsule (300 mg total) by mouth 2 (two) times daily for 2 days.   diltiazem 120 MG 24 hr capsule Commonly known as: Cardizem CD Take 1 capsule (120 mg total) by mouth daily. Hold for sbp <100 or hr < 60/min   docusate sodium 100 MG capsule Commonly known as: Colace Take 1 capsule (100 mg total) by mouth daily as needed.   EpiPen 2-Pak 0.3 mg/0.3 mL Soaj injection Generic drug: EPINEPHrine Inject 0.3 mg into the muscle as needed for anaphylaxis.   escitalopram 10 MG tablet Commonly known as: LEXAPRO TAKE 1 TABLET BY MOUTH EVERY DAY   ferrous sulfate 325 (65 FE) MG EC tablet Take 1 tablet (325 mg total) by mouth 2 (two) times daily.  hydroxychloroquine 200 MG tablet Commonly known as: PLAQUENIL Take 200 mg by mouth 2 (two) times daily.   ondansetron 4 MG tablet Commonly known as: Zofran Take 1 tablet (4 mg total) by mouth 3 (three) times daily as needed for nausea.   oxyCODONE 5 MG immediate release tablet Commonly known as: Oxy IR/ROXICODONE Take 1 tablet (5 mg total) by mouth every 4 (four) hours as needed for moderate pain.   pantoprazole 40 MG tablet Commonly known as: Protonix Take 1 tablet (40 mg total) by mouth 2 (two) times daily.   predniSONE 5 MG tablet Commonly known as: DELTASONE Take 0.5 tablets (2.5 mg total) by mouth daily with breakfast. Start on 06/15/20 What changed: additional instructions   sucralfate 1 GM/10ML suspension Commonly known as: CARAFATE Take 10 mLs (1 g total) by mouth 4 (four) times daily -  with meals and at bedtime.   voriconazole 200 MG tablet Commonly known as: VFEND TAKE 1 TABLET(200 MG) BY MOUTH TWICE DAILY What changed: See the new instructions.            Discharge Care Instructions  (From admission, onward)         Start     Ordered   06/16/20 0000  If the dressing is still on your incision site when you go home, remove it on the third day after your surgery date. Remove  dressing if it begins to fall off, or if it is dirty or damaged before the third day.        06/16/20 1331   06/16/20 0000  If the dressing is still on your incision site when you go home, remove it on the third day after your surgery date. Remove dressing if it begins to fall off, or if it is dirty or damaged before the third day.        06/16/20 1331          Follow-up Information    Surgery, [facility]. Go on 06/16/2020.   Specialty: General Surgery Why: RN visit for drain removal scheduled for 2:00 PM. Please arrive 30 min prior to appointment time for check in. Bring photo ID and insurance information.  Contact information: [address] [place] [address] [phone]        Hedda Slade, PA-C. Go on 07/01/2020.   Specialty: General Surgery Why: Follow up appointment scheduled for 9:15 AM. Please arrive 15 min prior to appointment time. Bring photo ID and insurance information.  Contact information: [address] [place] [address] [phone]        Kathleene Hazel, MD Follow up in 1 week(s).   Specialty: Cardiology Contact information: [address] [place] [address] [phone]        Charna Elizabeth, MD Follow up in 1 week(s).   Specialty: Gastroenterology Contact information: [address] [place] [address] [phone]              Allergies  Allergen Reactions  . Bee Venom Anaphylaxis  . Codeine Other (See Comments)    "seeing spots"  . Pirfenidone Other (See Comments)    GI side effects  . Penicillins Rash    Has patient had a PCN reaction causing immediate rash, facial/tongue/throat swelling, SOB or lightheadedness with hypotension: No Has patient had a PCN reaction causing severe rash involving mucus membranes or skin necrosis: No Has patient had a PCN reaction that required hospitalization No Has patient had  a PCN reaction occurring within the last 10 years:  Yes If all of the above answers are "NO", then may proceed with Cephalosporin use.     The results of significant diagnostics from this hospitalization (including imaging, microbiology, ancillary and laboratory) are listed below for reference.    Microbiology: Recent Results (from the past 240 hour(s))  Respiratory Panel by RT PCR (Flu A&B, Covid) - Nasopharyngeal Swab     Status: None   Collection Time: 06/09/20  6:03 AM   Specimen: Nasopharyngeal Swab  Result Value Ref Range Status   SARS Coronavirus 2 by RT PCR NEGATIVE NEGATIVE Final    Comment: (NOTE) SARS-CoV-2 target nucleic acids are NOT DETECTED.  The SARS-CoV-2 RNA is generally detectable in upper respiratoy specimens during the acute phase of infection. The lowest concentration of SARS-CoV-2 viral copies this assay can detect is 131 copies/mL. A negative result does not preclude SARS-Cov-2 infection and should not be used as the sole basis for treatment or other patient management decisions. A negative result may occur with  improper specimen collection/handling, submission of specimen other than nasopharyngeal swab, presence of viral mutation(s) within the areas targeted by this assay, and inadequate number of viral copies (<131 copies/mL). A negative result must be combined with clinical observations, patient history, and epidemiological information. The expected result is Negative.  Fact Sheet for Patients:  https://www.moore.com/  Fact Sheet for Healthcare Providers:  https://www.young.biz/  This test is no t yet approved or cleared by the [place] FDA and  has been authorized for detection and/or diagnosis of SARS-CoV-2 by FDA under an Emergency Use Authorization (EUA). This EUA will remain  in effect (meaning this test can be used) for the duration of the COVID-19 declaration under Section 564(b)(1) of the Act, 21 U.S.C. section 360bbb-3(b)(1), unless the  authorization is terminated or revoked sooner.     Influenza A by PCR NEGATIVE NEGATIVE Final   Influenza B by PCR NEGATIVE NEGATIVE Final    Comment: (NOTE) The Xpert Xpress SARS-CoV-2/FLU/RSV assay is intended as an aid in  the diagnosis of influenza from Nasopharyngeal swab specimens and  should not be used as a sole basis for treatment. Nasal washings and  aspirates are unacceptable for Xpert Xpress SARS-CoV-2/FLU/RSV  testing.  Fact Sheet for Patients: https://www.moore.com/  Fact Sheet for Healthcare Providers: https://www.young.biz/  This test is not yet approved or cleared by the [place] FDA and  has been authorized for detection and/or diagnosis of SARS-CoV-2 by  FDA under an Emergency Use Authorization (EUA). This EUA will remain  in effect (meaning this test can be used) for the duration of the  Covid-19 declaration under Section 564(b)(1) of the Act, 21  U.S.C. section 360bbb-3(b)(1), unless the authorization is  terminated or revoked. Performed at [facility], [address], [place], [place] [address]   MRSA PCR Screening     Status: None   Collection Time: 06/09/20 12:58 PM   Specimen: Nasal Mucosa; Nasopharyngeal  Result Value Ref Range Status   MRSA by PCR NEGATIVE NEGATIVE Final    Comment:        The GeneXpert MRSA Assay (FDA approved for NASAL specimens only), is one component of a comprehensive MRSA colonization surveillance program. It is not intended to diagnose MRSA infection nor to guide or monitor treatment for MRSA infections. Performed at [facility], [address], [place], [place] [address]     Procedures/Studies: CT ABDOMEN PELVIS W CONTRAST  Result Date: 06/09/2020  CLINICAL DATA:  Vomiting and abdominal pain. EXAM: CT ABDOMEN AND PELVIS WITH CONTRAST TECHNIQUE: Multidetector CT imaging of the abdomen and pelvis was performed using the standard protocol  following bolus administration of intravenous contrast. CONTRAST:  OMNIPAQUE IOHEXOL 300 MG/ML  SOLN COMPARISON:  CT chest 09/26/2019 FINDINGS: Lower chest: 10 mm nodule at the left base (image 19/series 4) is stable. Chronic peripheral interstitial lung disease evident, better characterized on previous chest CT as consistent with UIP. Hepatobiliary: Mild intrahepatic biliary duct prominence. Small area of low attenuation in the anterior liver, adjacent to the falciform ligament, is in a characteristic location for focal fatty deposition. Heterogeneous perfusion in the liver along the gallbladder fossa with some probable periportal edema inferiorly towards the gallbladder fundus. Gallbladder wall thickening is associated with multiple gallstones measuring up to about 10 mm diameter. There is pericholecystic edema/inflammation with fluid in the hepatoduodenal ligament/porta hepatis. Common bile duct measures 8 mm in the head of pancreas. Pancreas: Main pancreatic duct is distended up to 6 mm in the head of pancreas. No obstructing pancreatic head mass evident. Spleen: No splenomegaly. No focal mass lesion. Adrenals/Urinary Tract: No adrenal nodule or mass. Kidneys unremarkable. No evidence for hydroureter. The urinary bladder appears normal for the degree of distention. Stomach/Bowel: Tiny hiatal hernia. Stomach otherwise unremarkable. Duodenum is normally positioned as is the ligament of Treitz. No small bowel wall thickening. No small bowel dilatation. The terminal ileum is normal. The appendix is normal. No gross colonic mass. No colonic wall thickening. Vascular/Lymphatic: There is abdominal aortic atherosclerosis without aneurysm. There is no gastrohepatic or hepatoduodenal ligament lymphadenopathy. No retroperitoneal or mesenteric lymphadenopathy. No pelvic sidewall lymphadenopathy. Reproductive: The uterus is unremarkable.  There is no adnexal mass. Other: No gross intraperitoneal free fluid.  Musculoskeletal: No worrisome lytic or sclerotic osseous abnormality. IMPRESSION: 1. Cholelithiasis with gallbladder wall thickening, pericholecystic edema, and fluid in the hepatoduodenal ligament/porta hepatis. Imaging features are compatible with acute cholecystitis. 2. Mild intrahepatic biliary duct prominence with dilatation of the main pancreatic duct up to 6 mm in the head of pancreas. No choledocholithiasis apparent by CT although stones are not densely calcified. No obstructing pancreatic head mass evident. ERCP or MRCP may prove helpful to further evaluate. 3. Stable 10 mm left lower lobe pulmonary nodule with chronic peripheral interstitial lung disease, better characterized on previous chest CT as consistent with UIP. Please see that report. 4. Tiny hiatal hernia. 5. Aortic Atherosclerosis (ICD10-I70.0). Electronically Signed   By: Kennith Center M.D.   On: 06/09/2020 05:47   MR 3D Recon At Scanner  Result Date: 06/09/2020 CLINICAL DATA:  Vomiting, abdominal pain, acute cholecystitis suspected on CT EXAM: MRI ABDOMEN WITHOUT AND WITH CONTRAST (INCLUDING MRCP) TECHNIQUE: Multiplanar multisequence MR imaging of the abdomen was performed both before and after the administration of intravenous contrast. Heavily T2-weighted images of the biliary and pancreatic ducts were obtained, and three-dimensional MRCP images were rendered by post processing. CONTRAST:  6mL GADAVIST GADOBUTROL 1 MMOL/ML IV SOLN COMPARISON:  CT abdomen/pelvis dated 06/09/2020 FINDINGS: Motion degraded images. Lower chest: Subpleural patchy opacities at the lung bases, better evaluated on CT, favoring pulmonary fibrosis. Hepatobiliary: No suspicious/enhancing hepatic lesions. No hepatic steatosis. Cholelithiasis with gallbladder wall thickening and mild gallbladder distension. Fluid/stranding in the right upper/mid abdomen. This appearance remains compatible with acute cholecystitis. Mild central intrahepatic ductal prominence. Dilated  common duct, measuring 11 mm proximally (series 5/image 14). Although equivocal, there is a suspected 5 mm distal CBD stone at the ampulla (series 5/image  15). Pancreas: Mild fluid/stranding along the pancreatic head (series 13/image 20). However, in the setting of a normal lipase, this is likely secondary to the right upper quadrant inflammatory process. No pancreatic mass or atrophy. Spleen:  Within normal limits. Adrenals/Urinary Tract:  Adrenal glands are within normal limits. Kidneys are within normal limits.  No hydronephrosis. Stomach/Bowel: Stomach is within normal limits. Visualized bowel is unremarkable. Vascular/Lymphatic:  No evidence of abdominal aortic aneurysm. No suspicious abdominal lymphadenopathy. Other:  Fluid/stranding along the right mid abdominal mesentery. Musculoskeletal: Lumbar levoscoliosis with mild to moderate degenerative changes. IMPRESSION: Cholelithiasis with suspected acute cholecystitis. Dilated common duct, measuring 11 mm proximally. Suspected 5 mm distal CBD stone at the ampulla, although equivocal. Fluid/stranding along the right mid abdominal mesentery and along the pancreatic head, favored to be secondary to the right upper quadrant inflammatory process. Electronically Signed   By: Charline Bills M.D.   On: 06/09/2020 11:25   DG Chest Port 1 View  Result Date: 06/14/2020 CLINICAL DATA:  Mild assist for 6 months. EXAM: PORTABLE CHEST 1 VIEW COMPARISON:  Chest radiograph 06/09/2020. FINDINGS: Monitoring leads overlie the patient. Stable cardiothymic scratch the stable cardiac and mediastinal contours. Similar-appearing biapical scarring. Similar-appearing cavitary lesion within the right upper lobe. Slight interval increase in peripheral consolidation left mid lung. No pleural effusion or pneumothorax. IMPRESSION: 1. Slight interval increase in peripheral consolidation left mid lung, potentially superimposed infectious process. 2. Similar-appearing cavitary lesion right  upper lobe. Electronically Signed   By: Annia Belt M.D.   On: 06/14/2020 13:09   DG Chest Port 1 View  Result Date: 06/09/2020 CLINICAL DATA:  Short of breath EXAM: PORTABLE CHEST 1 VIEW COMPARISON:  Chest radiograph 08/16/2019, CT 09/26/2019 FINDINGS: Normal cardiac silhouette. Upper lobe interstitial thickening again noted. Cavitary change in the RIGHT upper lobe. More mild peripheral interstitial disease at the LEFT lung base. No new focal consolidation. No pneumothorax. IMPRESSION: No interval change in upper lobe interstitial lung disease with cavitation in the RIGHT upper lobe. Electronically Signed   By: Genevive Bi M.D.   On: 06/09/2020 14:28   MR ABDOMEN MRCP W WO CONTAST  Result Date: 06/09/2020 CLINICAL DATA:  Vomiting, abdominal pain, acute cholecystitis suspected on CT EXAM: MRI ABDOMEN WITHOUT AND WITH CONTRAST (INCLUDING MRCP) TECHNIQUE: Multiplanar multisequence MR imaging of the abdomen was performed both before and after the administration of intravenous contrast. Heavily T2-weighted images of the biliary and pancreatic ducts were obtained, and three-dimensional MRCP images were rendered by post processing. CONTRAST:  65mL GADAVIST GADOBUTROL 1 MMOL/ML IV SOLN COMPARISON:  CT abdomen/pelvis dated 06/09/2020 FINDINGS: Motion degraded images. Lower chest: Subpleural patchy opacities at the lung bases, better evaluated on CT, favoring pulmonary fibrosis. Hepatobiliary: No suspicious/enhancing hepatic lesions. No hepatic steatosis. Cholelithiasis with gallbladder wall thickening and mild gallbladder distension. Fluid/stranding in the right upper/mid abdomen. This appearance remains compatible with acute cholecystitis. Mild central intrahepatic ductal prominence. Dilated common duct, measuring 11 mm proximally (series 5/image 14). Although equivocal, there is a suspected 5 mm distal CBD stone at the ampulla (series 5/image 15). Pancreas: Mild fluid/stranding along the pancreatic head  (series 13/image 20). However, in the setting of a normal lipase, this is likely secondary to the right upper quadrant inflammatory process. No pancreatic mass or atrophy. Spleen:  Within normal limits. Adrenals/Urinary Tract:  Adrenal glands are within normal limits. Kidneys are within normal limits.  No hydronephrosis. Stomach/Bowel: Stomach is within normal limits. Visualized bowel is unremarkable. Vascular/Lymphatic:  No evidence of abdominal aortic aneurysm.  No suspicious abdominal lymphadenopathy. Other:  Fluid/stranding along the right mid abdominal mesentery. Musculoskeletal: Lumbar levoscoliosis with mild to moderate degenerative changes. IMPRESSION: Cholelithiasis with suspected acute cholecystitis. Dilated common duct, measuring 11 mm proximally. Suspected 5 mm distal CBD stone at the ampulla, although equivocal. Fluid/stranding along the right mid abdominal mesentery and along the pancreatic head, favored to be secondary to the right upper quadrant inflammatory process. Electronically Signed   By: Charline Bills M.D.   On: 06/09/2020 11:25   IR Radiologist Eval & Mgmt  Result Date: 05/27/2020 Please refer to notes tab for details about interventional procedure. (Op Note)   Labs: BNP (last 3 results) No results for input(s): BNP in the last 8760 hours. Basic Metabolic Panel: Recent Labs  Lab 06/11/20 0343 06/11/20 1801 06/12/20 0318 06/14/20 0337 06/15/20 0544 06/16/20 0506  NA 134*  --  140 140 140 139  K 4.5  --  3.7 3.3* 3.5 3.5  CL 105  --  109 106 107 105  CO2 23  --  24 25 25 25   GLUCOSE 143*  --  144* 83 82 69*  BUN 35*  --  30* 14 14 12   CREATININE 0.93  --  0.72 0.60 0.55 0.59  CALCIUM 7.7*  --  7.6* 7.8* 7.8* 7.7*  MG 2.4  --   --   --   --   --   PHOS 1.2* 2.5  --   --   --   --    Liver Function Tests: Recent Labs  Lab 06/11/20 0343 06/12/20 0318  AST 36 19  ALT 28 25  ALKPHOS 179* 157*  BILITOT 0.7 0.5  PROT 5.3* 5.7*  ALBUMIN 2.3* 2.5*   No  results for input(s): LIPASE, AMYLASE in the last 168 hours. No results for input(s): AMMONIA in the last 168 hours. CBC: Recent Labs  Lab 06/11/20 0343 06/11/20 0343 06/12/20 0318 06/12/20 1459 06/13/20 0729 06/14/20 0337 06/14/20 1840 06/15/20 0544 06/16/20 0506  WBC 18.8*   < > 16.9*  --  13.8* 12.2*  --  14.6* 13.0*  NEUTROABS 17.6*  --   --   --   --   --   --   --   --   HGB 7.8*   < > 7.0*   < > 8.9* 8.5* 8.5* 8.2* 8.2*  HCT 25.3*   < > 22.9*   < > 27.5* 26.2* 26.8* 26.6* 27.0*  MCV 95.5   < > 93.9  --  89.9 88.2  --  92.7 92.2  PLT 232   < > 269  --  250 251  --  321 366   < > = values in this interval not displayed.   Cardiac Enzymes: No results for input(s): CKTOTAL, CKMB, CKMBINDEX, TROPONINI in the last 168 hours. BNP: Invalid input(s): POCBNP CBG: No results for input(s): GLUCAP in the last 168 hours. D-Dimer No results for input(s): DDIMER in the last 72 hours. Hgb A1c No results for input(s): HGBA1C in the last 72 hours. Lipid Profile No results for input(s): CHOL, HDL, LDLCALC, TRIG, CHOLHDL, LDLDIRECT in the last 72 hours. Thyroid function studies No results for input(s): TSH, T4TOTAL, T3FREE, THYROIDAB in the last 72 hours.  Invalid input(s): FREET3 Anemia work up No results for input(s): VITAMINB12, FOLATE, FERRITIN, TIBC, IRON, RETICCTPCT in the last 72 hours. Urinalysis    Component Value Date/Time   COLORURINE YELLOW 06/09/2020 0117   APPEARANCEUR CLEAR 06/09/2020 0117   LABSPEC 1.031 (H)  06/09/2020 0117   PHURINE 6.0 06/09/2020 0117   GLUCOSEU NEGATIVE 06/09/2020 0117   HGBUR NEGATIVE 06/09/2020 0117   BILIRUBINUR NEGATIVE 06/09/2020 0117   BILIRUBINUR n 07/21/2015 1428   KETONESUR 5 (A) 06/09/2020 0117   PROTEINUR 30 (A) 06/09/2020 0117   UROBILINOGEN negative 07/21/2015 1428   UROBILINOGEN 2.0 (H) 02/16/2014 1226   NITRITE NEGATIVE 06/09/2020 0117   LEUKOCYTESUR NEGATIVE 06/09/2020 0117   Sepsis Labs Invalid input(s): PROCALCITONIN,   WBC,  LACTICIDVEN Microbiology Recent Results (from the past 240 hour(s))  Respiratory Panel by RT PCR (Flu A&B, Covid) - Nasopharyngeal Swab     Status: None   Collection Time: 06/09/20  6:03 AM   Specimen: Nasopharyngeal Swab  Result Value Ref Range Status   SARS Coronavirus 2 by RT PCR NEGATIVE NEGATIVE Final    Comment: (NOTE) SARS-CoV-2 target nucleic acids are NOT DETECTED.  The SARS-CoV-2 RNA is generally detectable in upper respiratoy specimens during the acute phase of infection. The lowest concentration of SARS-CoV-2 viral copies this assay can detect is 131 copies/mL. A negative result does not preclude SARS-Cov-2 infection and should not be used as the sole basis for treatment or other patient management decisions. A negative result may occur with  improper specimen collection/handling, submission of specimen other than nasopharyngeal swab, presence of viral mutation(s) within the areas targeted by this assay, and inadequate number of viral copies (<131 copies/mL). A negative result must be combined with clinical observations, patient history, and epidemiological information. The expected result is Negative.  Fact Sheet for Patients:  https://www.moore.com/  Fact Sheet for Healthcare Providers:  https://www.young.biz/  This test is no t yet approved or cleared by the [place] FDA and  has been authorized for detection and/or diagnosis of SARS-CoV-2 by FDA under an Emergency Use Authorization (EUA). This EUA will remain  in effect (meaning this test can be used) for the duration of the COVID-19 declaration under Section 564(b)(1) of the Act, 21 U.S.C. section 360bbb-3(b)(1), unless the authorization is terminated or revoked sooner.     Influenza A by PCR NEGATIVE NEGATIVE Final   Influenza B by PCR NEGATIVE NEGATIVE Final    Comment: (NOTE) The Xpert Xpress SARS-CoV-2/FLU/RSV assay is intended as an aid in  the diagnosis  of influenza from Nasopharyngeal swab specimens and  should not be used as a sole basis for treatment. Nasal washings and  aspirates are unacceptable for Xpert Xpress SARS-CoV-2/FLU/RSV  testing.  Fact Sheet for Patients: https://www.moore.com/  Fact Sheet for Healthcare Providers: https://www.young.biz/  This test is not yet approved or cleared by the [place] FDA and  has been authorized for detection and/or diagnosis of SARS-CoV-2 by  FDA under an Emergency Use Authorization (EUA). This EUA will remain  in effect (meaning this test can be used) for the duration of the  Covid-19 declaration under Section 564(b)(1) of the Act, 21  U.S.C. section 360bbb-3(b)(1), unless the authorization is  terminated or revoked. Performed at [facility], [address] [place], [place] [address]   MRSA PCR Screening     Status: None   Collection Time: 06/09/20 12:58 PM   Specimen: Nasal Mucosa; Nasopharyngeal  Result Value Ref Range Status   MRSA by PCR NEGATIVE NEGATIVE Final    Comment:        The GeneXpert MRSA Assay (FDA approved for NASAL specimens only), is one component of a comprehensive MRSA colonization surveillance program. It is not intended to diagnose MRSA infection nor to guide or  monitor treatment for MRSA infections. Performed at [facility], [address], [place], [place] [address]      Time coordinating discharge: 35 minutes  SIGNED: Lanae Boast, MD  [facility] 06/16/2020, 4:46 PM  If 7PM-7AM, please contact night-coverage [email or web address]

## 2020-06-16 NOTE — Progress Notes (Signed)
1 Day Post-Op   Subjective/Chief Complaint: No complaints. EGD yesterday showed multiple gastric and duodenal ulcers. Hemoglobin stable at 8.2 this morning.   Objective: Vital signs in last 24 hours: Temp:  [97.5 F (36.4 C)-98.6 F (37 C)] 98.1 F (36.7 C) (10/11 0515) Pulse Rate:  [69-88] 74 (10/11 0515) Resp:  [14-22] 16 (10/11 0515) BP: (101-134)/(55-81) 112/56 (10/11 0515) SpO2:  [100 %] 100 % (10/11 0515) Weight:  [70.7 kg] 70.7 kg (10/11 0500) Last BM Date: 06/15/20  Intake/Output from previous day: 10/10 0701 - 10/11 0700 In: 2152.2 [P.O.:360; I.V.:500; IV Piggyback:1292.2] Out: 1280 [Urine:1200; Drains:80] Intake/Output this shift: Total I/O In: 120 [P.O.:120] Out: 300 [Urine:300]  General appearance: alert and cooperative Resp: normal work of breathing Cardio: RRR GI: soft, nontender. JP with serous drainage Incision/Wound: Incisions clean and dry  Lab Results:  Recent Labs    06/15/20 0544 06/16/20 0506  WBC 14.6* 13.0*  HGB 8.2* 8.2*  HCT 26.6* 27.0*  PLT 321 366   BMET Recent Labs    06/15/20 0544 06/16/20 0506  NA 140 139  K 3.5 3.5  CL 107 105  CO2 25 25  GLUCOSE 82 69*  BUN 14 12  CREATININE 0.55 0.59  CALCIUM 7.8* 7.7*   PT/INR No results for input(s): LABPROT, INR in the last 72 hours. ABG No results for input(s): PHART, HCO3 in the last 72 hours.  Invalid input(s): PCO2, PO2  Studies/Results: DG Chest Port 1 View  Result Date: 06/14/2020 CLINICAL DATA:  Mild assist for 6 months. EXAM: PORTABLE CHEST 1 VIEW COMPARISON:  Chest radiograph 06/09/2020. FINDINGS: Monitoring leads overlie the patient. Stable cardiothymic scratch the stable cardiac and mediastinal contours. Similar-appearing biapical scarring. Similar-appearing cavitary lesion within the right upper lobe. Slight interval increase in peripheral consolidation left mid lung. No pleural effusion or pneumothorax. IMPRESSION: 1. Slight interval increase in peripheral  consolidation left mid lung, potentially superimposed infectious process. 2. Similar-appearing cavitary lesion right upper lobe. Electronically Signed   By: Annia Belt M.D.   On: 06/14/2020 13:09    Anti-infectives: Anti-infectives (From admission, onward)   Start     Dose/Rate Route Frequency Ordered Stop   06/12/20 0000  cefdinir (OMNICEF) 300 MG capsule        300 mg Oral 2 times daily 06/12/20 0758 06/17/20 2359   06/09/20 1330  hydroxychloroquine (PLAQUENIL) tablet 200 mg        200 mg Oral 2 times daily 06/09/20 0829     06/09/20 1330  voriconazole (VFEND) tablet 200 mg        200 mg Oral Every 12 hours 06/09/20 0829     06/09/20 0700  ceFEPIme (MAXIPIME) 2 g in sodium chloride 0.9 % 100 mL IVPB        2 g 200 mL/hr over 30 Minutes Intravenous Every 8 hours 06/09/20 0635     06/09/20 0700  metroNIDAZOLE (FLAGYL) IVPB 500 mg        500 mg 100 mL/hr over 60 Minutes Intravenous Every 6 hours 06/09/20 0635     06/09/20 0615  cefTRIAXone (ROCEPHIN) 1 g in sodium chloride 0.9 % 100 mL IVPB  Status:  Discontinued        1 g 200 mL/hr over 30 Minutes Intravenous  Once 06/09/20 0611 06/09/20 0700      Assessment/Plan: 59 yo female s/p lap chole for gangrenous cholecystitis. - Surgical drain removed today - EGD showed multiple ulcers, biopsies and gastrin level pending.  - Advance diet as  tolerated - Patient has follow up in surgery clinic on 10/26   LOS: 7 days    Kimberly Shelton 06/16/2020

## 2020-06-17 ENCOUNTER — Other Ambulatory Visit: Payer: Self-pay

## 2020-06-17 LAB — GASTRIN: Gastrin: 41 pg/mL (ref 0–115)

## 2020-06-18 ENCOUNTER — Telehealth: Payer: Self-pay | Admitting: Cardiovascular Disease

## 2020-06-18 ENCOUNTER — Other Ambulatory Visit: Payer: 59

## 2020-06-18 LAB — SURGICAL PATHOLOGY

## 2020-06-18 NOTE — Telephone Encounter (Signed)
New message:     Patient calling stating she need a follow up with the doctor he do not have anything till February. Patient only would like to see the doctor. Please call patient.

## 2020-06-23 NOTE — Progress Notes (Signed)
Cardiology Office Note    Date:  06/25/2020   ID:  Kimberly, Shelton Jul 03, 1961, MRN [id number]  PCP:  Kristian Covey, MD  Cardiologist: Verne Carrow, MD EPS: None  Chief Complaint  Patient presents with  . Hospitalization Follow-up    History of Present Illness:  Kimberly Shelton is a 59 y.o. female who was seen by Dr. Clifton James in 2016 for evaluation of tachycardia.  Echo at that time normal LV function with no evidence of pericardial effusion.  Was felt her tachycardia was in response to her acute illness of pneumococcal pneumonia with cavitation in the setting of smoking and rheumatoid arthritis on immune modulators.  Tachycardia resolved after treatment of pneumonia.   I saw the patient 12/29/17 when referred back by Dr. [name] because of  echo findings of atrial septal aneurysm.  2D echo 12/12/17 normal LVEF 65-70% with grade 1 DD and atrial septal aneurysm. Dr. Elease Hashimoto reviewed and felt it was an incidental finding and she probably had it all her life.    Patient was having worsening shortness of breath with desaturations to 89% walking 40 feet heart rate up to 150 bpm felt to be deconditioning.  Echo was ordered.   Patient is referred back by Dr. [name] because of abnormal echo done 09/20/2019.  She had normal LVEF 60 to 65% with mild increased LVH, possible left to right shunt across the intra-atrial septum.  Chest CT 09/26/2019 showed progressive fibrotic interstitial lung disease with mild to moderate honeycombing clearly progressed compared to CT in 2015 in 2019.  TEE was discussed with patient to further assess shunting patient declined at that time.  Patient saw Dr. Clifton James back 10/30/2018 and reviewed her echo with the imaging team.  She had no clinical sequela of a PFO or ASD.  TEE would definitively exclude an atrial septal defect but at that  time he did not think it was clinically indicated.   Patient discharged from the hospital 06/16/2020 after  cholecystectomy.  On 06/14/2020 she had new onset A. fib with RVR and hypotension treated with fluids and diltiazem and converted to normal sinus rhythm.  EGD 06/15/2020 showed severe esophagitis nonbleeding gastric ulcer numerous duodenal ulcers.  Patient comes in for f/u. Feels very weak. Thinks her BP is too low 101/52. Did receive a transfusion. hgb 8.2 at discharge. Denies any further palpitations or racing. Taking diltiazem but wondering if she needs it with BP low. Drinks a lot of caffeine with coke, sweet tea, mountain dew-which she stopped.    Past Medical History:  Diagnosis Date  . Abnormal Pap smear 09/2006   HGSIL CIN 2/VAIN/ CIN 3/VAIN-3 CIS  . CHF (congestive heart failure) (HCC)   . Foot fracture, left 10/16   hair-line fracture  . History of pneumonia 02/09/2016  . Pneumonia    was hospitalized  . RA (rheumatoid arthritis) (HCC) 07/2009    Past Surgical History:  Procedure Laterality Date  . BIOPSY  06/15/2020   Procedure: BIOPSY;  Surgeon: Benancio Deeds, MD;  Location: [facility];  Service: Gastroenterology;;  . BRONCHIAL WASHINGS  11/27/2019   Procedure: BRONCHIAL WASHINGS;  Surgeon: Kalman Shan, MD;  Location: [facility];  Service: Cardiopulmonary;;  . CERVICAL BIOPSY  W/ LOOP ELECTRODE EXCISION  2008   CIN 3  . CERVICAL CONIZATION W/BX N/A 12/15/2015   Procedure: CONIZATION CERVIX WITH BIOPSY ;  Surgeon: Jerene Bears, MD;  Location: [facility];  Service: Gynecology;  Laterality: N/A;  POSSIBLE COLD KNIFE  CONE.  Please have the microscope in the room  . CHOLECYSTECTOMY N/A 06/10/2020   Procedure: LAPAROSCOPIC CHOLECYSTECTOMY;  Surgeon: Griselda Miner, MD;  Location: [facility];  Service: General;  Laterality: N/A;  . COLONOSCOPY    . ESOPHAGOGASTRODUODENOSCOPY (EGD) WITH PROPOFOL N/A 06/15/2020   Procedure: ESOPHAGOGASTRODUODENOSCOPY (EGD) WITH PROPOFOL;  Surgeon: Benancio Deeds, MD;  Location: [facility];  Service: Gastroenterology;  Laterality: N/A;   . IR RADIOLOGIST EVAL & MGMT  05/27/2020  . LEEP N/A 12/15/2015   Procedure: LOOP ELECTROSURGICAL EXCISION PROCEDURE (LEEP) with colpo;  Surgeon: Jerene Bears, MD;  Location: [facility];  Service: Gynecology;  Laterality: N/A;  . PILONIDAL CYST EXCISION  1999   I&D  . VIDEO BRONCHOSCOPY N/A 11/27/2019   Procedure: VIDEO BRONCHOSCOPY WITHOUT FLUORO;  Surgeon: Kalman Shan, MD;  Location: [facility];  Service: Cardiopulmonary;  Laterality: N/A;  . WISDOM TOOTH EXTRACTION      Current Medications: Current Meds  Medication Sig  . acetaminophen (TYLENOL) 325 MG tablet Take 2 tablets (650 mg total) by mouth every 6 (six) hours as needed for mild pain (or Fever >/= 101).  Marland Kitchen albuterol (VENTOLIN HFA) 108 (90 Base) MCG/ACT inhaler INHALE 2 PUFFS INTO THE LUNGS EVERY 6 HOURS AS NEEDED FOR WHEEZING OR SHORTNESS OF BREATH (Patient taking differently: Inhale 2 puffs into the lungs every 6 (six) hours as needed for wheezing or shortness of breath. )  . diltiazem (CARDIZEM CD) 120 MG 24 hr capsule Take 1 capsule (120 mg total) by mouth as needed. Hold for sbp <100 or hr < 60/min  . docusate sodium (COLACE) 100 MG capsule Take 1 capsule (100 mg total) by mouth daily as needed.  Marland Kitchen EPINEPHrine (EPIPEN 2-PAK) 0.3 mg/0.3 mL IJ SOAJ injection Inject 0.3 mg into the muscle as needed for anaphylaxis.   Marland Kitchen escitalopram (LEXAPRO) 10 MG tablet TAKE 1 TABLET BY MOUTH EVERY DAY (Patient taking differently: Take 10 mg by mouth daily. )  . ferrous sulfate 325 (65 FE) MG EC tablet Take 1 tablet (325 mg total) by mouth 2 (two) times daily.  . hydroxychloroquine (PLAQUENIL) 200 MG tablet Take 200 mg by mouth 2 (two) times daily.  . ondansetron (ZOFRAN) 4 MG tablet Take 1 tablet (4 mg total) by mouth 3 (three) times daily as needed for nausea.  Marland Kitchen oxyCODONE (OXY IR/ROXICODONE) 5 MG immediate release tablet Take 1 tablet (5 mg total) by mouth every 4 (four) hours as needed for moderate pain.  . pantoprazole (PROTONIX) 40 MG  tablet Take 1 tablet (40 mg total) by mouth 2 (two) times daily.  . predniSONE (DELTASONE) 5 MG tablet Take 0.5 tablets (2.5 mg total) by mouth daily with breakfast. Start on 06/15/20  . sucralfate (CARAFATE) 1 GM/10ML suspension Take 10 mLs (1 g total) by mouth 4 (four) times daily -  with meals and at bedtime.  . [DISCONTINUED] diltiazem (CARDIZEM CD) 120 MG 24 hr capsule Take 1 capsule (120 mg total) by mouth daily. Hold for sbp <100 or hr < 60/min     Allergies:   Bee venom, Codeine, Pirfenidone, and Penicillins   Social History   Socioeconomic History  . Marital status: Divorced    Spouse name: Not on file  . Number of children: 1  . Years of education: Not on file  . Highest education level: Not on file  Occupational History  . Occupation: Stage manager: [employer]  Tobacco Use  . Smoking status:  Former Smoker    Packs/day: 1.00    Years: 30.00    Pack years: 30.00    Types: Cigarettes, E-cigarettes    Start date: 1985    Quit date: 02/16/2014    Years since quitting: 6.3  . Smokeless tobacco: Never Used  Vaping Use  . Vaping Use: Every day  Substance and Sexual Activity  . Alcohol use: No    Alcohol/week: 0.0 standard drinks  . Drug use: No  . Sexual activity: Not Currently    Partners: Male    Birth control/protection: Post-menopausal  Other Topics Concern  . 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 Difficulty of Paying Living Expenses: Not on file  Food Insecurity:   . Worried About Programme researcher, broadcasting/film/video in the Last Year: Not on file  . Ran Out of Food in the Last Year: Not on file  Transportation Needs:   . Lack of Transportation (Medical): Not on file  . Lack of Transportation (Non-Medical): Not on file  Physical Activity:   . Days of Exercise per Week: Not on file  . Minutes of Exercise per Session: Not on file  Stress:   . Feeling  of Stress : Not on file  Social Connections:   . Frequency of Communication with Friends and Family: Not on file  . Frequency of Social Gatherings with Friends and Family: Not on file  . Attends Religious Services: Not on file  . Active Member of Clubs or Organizations: Not on file  . Attends Banker Meetings: Not on file  . Marital Status: Not on file     Family History:  The patient's   family history includes Breast cancer (age of onset: 41) in her maternal aunt; Bronchitis in her father; COPD in her father; Cancer in her brother; Diabetes in her father and mother; Rheum arthritis in her mother.   ROS:   Please see the history of present illness.    ROS All other systems reviewed and are negative.   PHYSICAL EXAM:   VS:  BP (!) 110/52   Pulse 95   Ht  (1.6 m)   Wt 137 lb 9.6 oz (62.4 kg)   LMP 09/06/2004   SpO2 95%   BMI 24.37 kg/m   Physical Exam  GEN: Well nourished, well developed, in no acute distress  Neck: no JVD, carotid bruits, or masses Cardiac:RRR; 2/6 systolic murmur the left sternal border Respiratory:  clear to auscultation bilaterally, normal work of breathing GI: soft, nontender, nondistended, + BS Ext: without cyanosis, clubbing, or edema, Good distal pulses bilaterally Neuro:  Alert and Oriented x 3 Psych: euthymic mood, full affect  Wt Readings from Last 3 Encounters:  06/25/20 137 lb 9.6 oz (62.4 kg)  06/16/20 155 lb 13.8 oz (70.7 kg)  05/15/20 144 lb (65.3 kg)      Studies/Labs Reviewed:   EKG:  EKG is not ordered today.   Recent Labs: 06/11/2020: Magnesium 2.4 06/12/2020: ALT 25 06/16/2020: BUN 12; Creatinine, Ser 0.59; Hemoglobin 8.2; Platelets 366; Potassium 3.5; Sodium 139   Lipid Panel    Component Value Date/Time   CHOL 188 09/01/2016 0743   TRIG 113.0 09/01/2016 0743   HDL 77.40 09/01/2016 0743   CHOLHDL 2 09/01/2016 0743   VLDL 22.6 09/01/2016 0743   LDLCALC 88 09/01/2016 0743    Additional studies/ records  that were reviewed today include:  Echo 09/20/19:  1. Left ventricular ejection fraction, by visual estimation, is 60 to  65%. The left ventricle has normal function. There is mildly increased  left ventricular hypertrophy.   2. The left ventricle has no regional wall motion abnormalities.   3. Global right ventricle has normal systolic function.The right  ventricular size is normal. No increase in right ventricular wall  thickness.   4. Left atrial size was normal.   5. Right atrial size was normal.   6. The mitral valve is normal in structure. Trivial mitral valve  regurgitation.   7. The tricuspid valve is normal in structure.   8. The aortic valve is tricuspid. Aortic valve regurgitation is not  visualized. No evidence of aortic valve sclerosis or stenosis.   9. The pulmonic valve was grossly normal. Pulmonic valve regurgitation is  not visualized.  10. The inferior vena cava is normal in size with greater than 50%  respiratory variability, suggesting right atrial pressure of 3 mmHg.  11. Possible left to right shunt across intra-atrial septum.      ASSESSMENT:    1. Atrial fibrillation, unspecified type (HCC)   2. Atrial septal aneurysm   3. Chronic diastolic CHF (congestive heart failure) (HCC)   4. History of tachycardia   5. Aortic atherosclerosis (HCC)      PLAN:  In order of problems listed above:  PAF associated with hypotension converted to normal sinus rhythm with diltiazem and IV fluids.  Occurred in the acute setting of GI bleed and cholecystitis.  Patient was placed on diltiazem but blood pressures are running low and she would like to stop it.  We will let her take diltiazem as needed.  Order 2-week monitor.  Avoid caffeine products and decongestants.  CHA2DS2-VASc score equals 2 for female and diastolic CHF.  In the setting of GI bleed and long incident of A. fib no anticoagulation recommended.  Atrial septal aneurysm on echo with question of ASD TEE would be  definitive but Dr. Clifton James felt she was asymptomatic and it was not clinically indicated in 10/2019  Chronic diastolic CHF compensated  Aortic atherosclerosis with one-vessel CAD found on CT, no chest pain   Medication Adjustments/Labs and Tests Ordered: Current medicines are reviewed at length with the patient today.  Concerns regarding medicines are outlined above.  Medication changes, Labs and Tests ordered today are listed in the Patient Instructions below. Patient Instructions  Medication Instructions:  Your physician has recommended you make the following change in your medication:   CHANGE: DILTIAZEM 120MG  AS NEEDED  *If you need a refill on your cardiac medications before your next appointment, please call your pharmacy*   Lab Work: None If you have labs (blood work) drawn today and your tests are completely normal, you will receive your results only by: MyChart Message (if you have MyChart) OR . A paper copy in the mail If you have any lab test that is abnormal or we need to change your treatment, we will call you to review the results.   Testing/Procedures: Your physician has recommended that you wear a zio monitor. These monitors are medical devices that record the heart's electrical activity. Doctors most often use these monitors to diagnose arrhythmias. Arrhythmias are problems with the speed or rhythm of the heartbeat. The monitor is a small, portable device. You can wear one while you do your normal daily activities. This is usually used to diagnose what is causing palpitations/syncope (passing out).  Follow-Up: At [facility], you and your health needs are our priority.  As part of our continuing mission to provide you with exceptional heart care, we have created designated Provider Care Teams.  These Care Teams include your primary Cardiologist (physician) and Advanced Practice Providers (APPs -  Physician Assistants and Nurse Practitioners) who all work  together to provide you with the care you need, when you need it.  We recommend signing up for the patient portal called "MyChart".  Sign up information is provided on this After Visit Summary.  MyChart is used to connect with patients for Virtual Visits (Telemedicine).  Patients are able to view lab/test results, encounter notes, upcoming appointments, etc.  Non-urgent messages can be sent to your provider as well.   To learn more about what you can do with MyChart, go to [email or web address].    Your next appointment:   08/06/2020  The format for your next appointment:   In Person  Provider:   Jacolyn Reedy, PA-C   Other Instructions ZIO XT- Long Term Monitor Instructions   Your physician has requested you wear your ZIO patch monitor___14____days.   This is a single patch monitor.  [employer] supplies one patch monitor per enrollment.  Additional stickers are not available.   Please do not apply patch if you will be having a Nuclear Stress Test, Echocardiogram, Cardiac CT, MRI, or Chest Xray during the time frame you would be wearing the monitor. The patch cannot be worn during these tests.  You cannot remove and re-apply the ZIO XT patch monitor.   Your ZIO patch monitor will be sent USPS Priority mail from [facility] directly to your home address. The monitor may also be mailed to a PO BOX if home delivery is not available.   It may take 3-5 days to receive your monitor after you have been enrolled.   Once you have received you monitor, please review enclosed instructions.  Your monitor has already been registered assigning a specific monitor serial # to you.   Applying the monitor   Shave hair from upper left chest.   Hold abrader disc by orange tab.  Rub abrader in 40 strokes over left upper chest as indicated in your monitor instructions.   Clean area with 4 enclosed alcohol pads .  Use all pads to assure are is cleaned thoroughly.  Let dry.   Apply patch as  indicated in monitor instructions.  Patch will be place under collarbone on left side of chest with arrow pointing upward.   Rub patch adhesive wings for 2 minutes.Remove white label marked "1".  Remove white label marked "2".  Rub patch adhesive wings for 2 additional minutes.   While looking in a mirror, press and release button in center of patch.  A small green light will flash 3-4 times .  This will be your only indicator the monitor has been turned on.     Do not shower for the first 24 hours.  You may shower after the first 24 hours.   Press button if you feel a symptom. You will hear a small click.  Record Date, Time and Symptom in the Patient Log Book.   When you are ready to remove patch, follow instructions on last 2 pages of Patient Log Book.  Stick patch monitor onto last page of Patient Log Book.   Place Patient Log Book in [place] box.  Use locking tab on box and tape box closed securely.  The [place] and [employer]  box has prepaid postage on it.  Please place in mailbox as soon as possible.  Your physician should have your test results approximately 7 days after the monitor has been mailed back to [facility].   Call [facility] Customer Care at [phone] if you have questions regarding your ZIO XT patch monitor.  Call them immediately if you see an orange light blinking on your monitor.   If your monitor falls off in less than 4 days contact our Monitor department at [phone].  If your monitor becomes loose or falls off after 4 days call [employer] at [phone] for suggestions on securing your monitor.       Elson Clan, PA-C  06/25/2020 11:36 AM    [facility] [address] [place], [place], [place]  [address] Phone: [phone]; Fax: [phone]

## 2020-06-24 NOTE — Telephone Encounter (Signed)
On day she was supppiosed to go To IR she got admitted for gall bladder issues  Plan  - please  re refer IR for hemoptysis and Pulmonary cavit and evaluation for embolization

## 2020-06-25 ENCOUNTER — Ambulatory Visit (INDEPENDENT_AMBULATORY_CARE_PROVIDER_SITE_OTHER): Payer: 59 | Admitting: Physician Assistant

## 2020-06-25 ENCOUNTER — Encounter: Payer: Self-pay | Admitting: *Deleted

## 2020-06-25 ENCOUNTER — Encounter: Payer: Self-pay | Admitting: Physician Assistant

## 2020-06-25 ENCOUNTER — Other Ambulatory Visit: Payer: Self-pay

## 2020-06-25 VITALS — BP 110/52 | HR 95 | Ht 63.0 in | Wt 137.6 lb

## 2020-06-25 DIAGNOSIS — Z87898 Personal history of other specified conditions: Secondary | ICD-10-CM | POA: Diagnosis not present

## 2020-06-25 DIAGNOSIS — I253 Aneurysm of heart: Secondary | ICD-10-CM | POA: Diagnosis not present

## 2020-06-25 DIAGNOSIS — I4891 Unspecified atrial fibrillation: Secondary | ICD-10-CM

## 2020-06-25 DIAGNOSIS — I5032 Chronic diastolic (congestive) heart failure: Secondary | ICD-10-CM | POA: Diagnosis not present

## 2020-06-25 DIAGNOSIS — I700 Atherosclerosis of aorta: Secondary | ICD-10-CM

## 2020-06-25 MED ORDER — DILTIAZEM HCL ER COATED BEADS 120 MG PO CP24: 120.0000 mg | ORAL_CAPSULE | ORAL | 1 refills | Status: DC | PRN

## 2020-06-25 NOTE — Progress Notes (Signed)
Patient ID: Kimberly Shelton, female   DOB: Mar 29, 1961, 59 y.o.   MRN: [id number] Patient enrolled for [employer] to ship a 14 day ZIO XT long term holter monitor to her home.

## 2020-06-25 NOTE — Patient Instructions (Signed)
Medication Instructions:  Your physician has recommended you make the following change in your medication:   CHANGE: DILTIAZEM 120MG  AS NEEDED  *If you need a refill on your cardiac medications before your next appointment, please call your pharmacy*   Lab Work: None If you have labs (blood work) drawn today and your tests are completely normal, you will receive your results only by: MyChart Message (if you have MyChart) OR . A paper copy in the mail If you have any lab test that is abnormal or we need to change your treatment, we will call you to review the results.   Testing/Procedures: Your physician has recommended that you wear a zio monitor. These monitors are medical devices that record the heart's electrical activity. Doctors most often use these monitors to diagnose arrhythmias. Arrhythmias are problems with the speed or rhythm of the heartbeat. The monitor is a small, portable device. You can wear one while you do your normal daily activities. This is usually used to diagnose what is causing palpitations/syncope (passing out).     Follow-Up: At [facility], you and your health needs are our priority.  As part of our continuing mission to provide you with exceptional heart care, we have created designated Provider Care Teams.  These Care Teams include your primary Cardiologist (physician) and Advanced Practice Providers (APPs -  Physician Assistants and Nurse Practitioners) who all work together to provide you with the care you need, when you need it.  We recommend signing up for the patient portal called "MyChart".  Sign up information is provided on this After Visit Summary.  MyChart is used to connect with patients for Virtual Visits (Telemedicine).  Patients are able to view lab/test results, encounter notes, upcoming appointments, etc.  Non-urgent messages can be sent to your provider as well.   To learn more about what you can do with MyChart, go to  [facility].    Your next appointment:   08/06/2020  The format for your next appointment:   In Person  Provider:   14/09/2019, PA-C   Other Instructions ZIO XT- Long Term Monitor Instructions   Your physician has requested you wear your ZIO patch monitor___14____days.   This is a single patch monitor.  Ir[employer] supplies one patch monitor per enrollment.  Additional stickers are not available.   Please do not apply patch if you will be having a Nuclear Stress Test, Echocardiogram, Cardiac CT, MRI, or Chest Xray during the time frame you would be wearing the monitor. The patch cannot be worn during these tests.  You cannot remove and re-apply the ZIO XT patch monitor.   Your ZIO patch monitor will be sent USPS Priority mail from [facility] directly to your home address. The monitor may also be mailed to a PO BOX if home delivery is not available.   It may take 3-5 days to receive your monitor after you have been enrolled.   Once you have received you monitor, please review enclosed instructions.  Your monitor has already been registered assigning a specific monitor serial # to you.   Applying the monitor   Shave hair from upper left chest.   Hold abrader disc by orange tab.  Rub abrader in 40 strokes over left upper chest as indicated in your monitor instructions.   Clean area with 4 enclosed alcohol pads .  Use all pads to assure are is cleaned thoroughly.  Let dry.   Apply patch as indicated in monitor instructions.  Patch  will be place under collarbone on left side of chest with arrow pointing upward.   Rub patch adhesive wings for 2 minutes.Remove white label marked "1".  Remove white label marked "2".  Rub patch adhesive wings for 2 additional minutes.   While looking in a mirror, press and release button in center of patch.  A small green light will flash 3-4 times .  This will be your only indicator the monitor has been turned on.     Do not shower  for the first 24 hours.  You may shower after the first 24 hours.   Press button if you feel a symptom. You will hear a small click.  Record Date, Time and Symptom in the Patient Log Book.   When you are ready to remove patch, follow instructions on last 2 pages of Patient Log Book.  Stick patch monitor onto last page of Patient Log Book.   Place Patient Log Book in [place] box.  Use locking tab on box and tape box closed securely.  The [place] and [employer] has [employer] on it.  Please place in mailbox as soon as possible.  Your physician should have your test results approximately 7 days after the monitor has been mailed back to [facility].   Call [facility] Customer Care at [phone] if you have questions regarding your ZIO XT patch monitor.  Call them immediately if you see an orange light blinking on your monitor.   If your monitor falls off in less than 4 days contact our Monitor department at [phone].  If your monitor becomes loose or falls off after 4 days call [employer] at [phone] for suggestions on securing your monitor.

## 2020-06-25 NOTE — Telephone Encounter (Signed)
Called and spoke with pt letting her know the info stated by MR and she stated that while she was in the hospital due to her gallbladder, she ended up having emergency gallbladder surgery. While there, they did run a scope down her throat and saw that she had bleeding ulcers which she believes that those ulcers was the reason for why she had been coughing/vomiting up blood as often as she was.  Pt stated that her hemoglobin was low and she was also anemic so they gave her a unit of blood.  Pt stated that she goes back to see gastroenterologist 12/15 and they will run scope down throat again to have ulcers reevaluated.  Pt wants to hold off on the referral to IR at this time if this is okay with MR due to thinking that they have possibly figured out the reason for the hemoptysis.  MR, please advise if you are okay with holding off on the referral at this time.

## 2020-06-26 ENCOUNTER — Encounter (HOSPITAL_COMMUNITY): Payer: Self-pay

## 2020-06-26 ENCOUNTER — Other Ambulatory Visit (HOSPITAL_COMMUNITY): Payer: 59

## 2020-06-26 NOTE — Telephone Encounter (Signed)
Called and spoke with pt letting her know that MR was fine holding off on the referral and she verbalized understanding. Nothing further needed.

## 2020-06-26 NOTE — Telephone Encounter (Signed)
Seen by Leda Gauze, PA-C yesterday.

## 2020-06-26 NOTE — Telephone Encounter (Signed)
Ok to hold off IR referral

## 2020-06-27 ENCOUNTER — Ambulatory Visit (INDEPENDENT_AMBULATORY_CARE_PROVIDER_SITE_OTHER): Payer: 59

## 2020-06-27 DIAGNOSIS — I4891 Unspecified atrial fibrillation: Secondary | ICD-10-CM | POA: Diagnosis not present

## 2020-07-01 ENCOUNTER — Telehealth: Payer: 59

## 2020-07-10 ENCOUNTER — Ambulatory Visit: Payer: BC Managed Care – PPO | Admitting: Obstetrics & Gynecology

## 2020-07-15 ENCOUNTER — Telehealth: Payer: Self-pay | Admitting: Internal Medicine

## 2020-07-15 NOTE — Telephone Encounter (Signed)
Per last ov AVS from MR    Plan -list esbriet as allergy -Continue nintedanib 150 mg twice daily -Check CBC, chemistry and liver function test as part of therapeutic monitoring -today 2020-05-15  Called [facility] and [facility] for [employer]

## 2020-07-16 NOTE — Telephone Encounter (Signed)
Spoke to [place] with [facility], who wanted to verify that patient is still taking ofev 150. Per Caron Presume was last refilled on 05/15/2020. Patient did not receive a refill in October.  If patient is still taking this medication, a PA will be needed.   Spoke to patient to verify. Patient stated that she no longer takes this medication, as it was discontinued during her recent admission. She had emergency gallbladder surgery. appt scheduled with MR on 08/14/2020 at 10:00.   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

## 2020-07-17 NOTE — Telephone Encounter (Signed)
Attempted to call pt to let her know that MR said he would discuss next steps at upcoming appt but unable to reach. Left pt a detailed message of that info.  Called [facility] and spoke with Lam letting him know that pt's OFEV was discontinued at last hospital stay and that next steps would be discussed at upcoming OV. Lam verbalized understanding. Nothing further needed.

## 2020-07-17 NOTE — Telephone Encounter (Signed)
Will discuss 10/16/19 visit to next steps

## 2020-07-21 ENCOUNTER — Telehealth: Payer: Self-pay | Admitting: Internal Medicine

## 2020-07-21 DIAGNOSIS — R042 Hemoptysis: Secondary | ICD-10-CM

## 2020-07-21 DIAGNOSIS — J849 Interstitial pulmonary disease, unspecified: Secondary | ICD-10-CM

## 2020-07-21 NOTE — Telephone Encounter (Signed)
Called and spoke with pt who states she was in the hospital 10/4-10/11. Pt had gallbladder removed 10/5.  Pt stated that she just received her booster vaccine 11/5 and since the booster she feels like she is more SOB. Pt also has had yellow postnasal drainage, has been coughing stating that she will occ cough up some blood. Pt also has been wheezing and feels weak.  Stated to pt that we needed to get her scheduled for an appt and she verbalized understanding. appt scheduled for pt with MR tomorrow 11/16. Nothing further needed.

## 2020-07-21 NOTE — Telephone Encounter (Signed)
Called and spoke with pt in regards to MR wanting pt to get labwork done. Pt said she can come in earlier tomorrow 11/16 (day of appt) to have labwork done. Orders have been placed. Nothing further needed.

## 2020-07-21 NOTE — Telephone Encounter (Signed)
Saw patient Kimberly Shelton 's sister - > she reported that patient has made an appointment for tomorrow.  Patient had recent blood transfusion.  The sister is concerned the patient is sick.  Plan -Could the patient come in a little bit early and get stat blood work cbc, bme, lft, mag, phos 07/22/20 or better still  even can do blood work 07/21/2020 today anytime. This can help for visit tomorrow

## 2020-07-22 ENCOUNTER — Other Ambulatory Visit: Payer: Self-pay

## 2020-07-22 ENCOUNTER — Ambulatory Visit (INDEPENDENT_AMBULATORY_CARE_PROVIDER_SITE_OTHER): Payer: 59 | Admitting: Internal Medicine

## 2020-07-22 ENCOUNTER — Encounter: Payer: Self-pay | Admitting: Internal Medicine

## 2020-07-22 ENCOUNTER — Other Ambulatory Visit (INDEPENDENT_AMBULATORY_CARE_PROVIDER_SITE_OTHER): Payer: 59

## 2020-07-22 VITALS — BP 140/60 | HR 103 | Temp 99.2°F | Ht 63.0 in | Wt 138.6 lb

## 2020-07-22 DIAGNOSIS — B449 Aspergillosis, unspecified: Secondary | ICD-10-CM | POA: Diagnosis not present

## 2020-07-22 DIAGNOSIS — R5381 Other malaise: Secondary | ICD-10-CM

## 2020-07-22 DIAGNOSIS — E878 Other disorders of electrolyte and fluid balance, not elsewhere classified: Secondary | ICD-10-CM

## 2020-07-22 DIAGNOSIS — R042 Hemoptysis: Secondary | ICD-10-CM

## 2020-07-22 DIAGNOSIS — J8489 Other specified interstitial pulmonary diseases: Secondary | ICD-10-CM

## 2020-07-22 DIAGNOSIS — Z5181 Encounter for therapeutic drug level monitoring: Secondary | ICD-10-CM

## 2020-07-22 DIAGNOSIS — Z0271 Encounter for disability determination: Secondary | ICD-10-CM

## 2020-07-22 DIAGNOSIS — D500 Iron deficiency anemia secondary to blood loss (chronic): Secondary | ICD-10-CM

## 2020-07-22 DIAGNOSIS — M359 Systemic involvement of connective tissue, unspecified: Secondary | ICD-10-CM

## 2020-07-22 DIAGNOSIS — J849 Interstitial pulmonary disease, unspecified: Secondary | ICD-10-CM | POA: Diagnosis not present

## 2020-07-22 LAB — CBC WITH DIFFERENTIAL/PLATELET
Basophils Absolute: 0.1 10*3/uL (ref 0.0–0.1)
Basophils Relative: 0.8 % (ref 0.0–3.0)
Eosinophils Absolute: 0.1 10*3/uL (ref 0.0–0.7)
Eosinophils Relative: 0.5 % (ref 0.0–5.0)
HCT: 29.3 % — ABNORMAL LOW (ref 36.0–46.0)
Hemoglobin: 9.2 g/dL — ABNORMAL LOW (ref 12.0–15.0)
Lymphocytes Relative: 6.1 % — ABNORMAL LOW (ref 12.0–46.0)
Lymphs Abs: 0.9 10*3/uL (ref 0.7–4.0)
MCHC: 31.5 g/dL (ref 30.0–36.0)
MCV: 79.4 fl (ref 78.0–100.0)
Monocytes Absolute: 1.1 10*3/uL — ABNORMAL HIGH (ref 0.1–1.0)
Monocytes Relative: 7.8 % (ref 3.0–12.0)
Neutro Abs: 12.1 10*3/uL — ABNORMAL HIGH (ref 1.4–7.7)
Neutrophils Relative %: 84.8 % — ABNORMAL HIGH (ref 43.0–77.0)
Platelets: 667 10*3/uL — ABNORMAL HIGH (ref 150.0–400.0)
RBC: 3.69 Mil/uL — ABNORMAL LOW (ref 3.87–5.11)
RDW: 17.2 % — ABNORMAL HIGH (ref 11.5–15.5)
WBC: 14.3 10*3/uL — ABNORMAL HIGH (ref 4.0–10.5)

## 2020-07-22 LAB — HEPATIC FUNCTION PANEL
ALT: 11 U/L (ref 0–35)
AST: 11 U/L (ref 0–37)
Albumin: 3.1 g/dL — ABNORMAL LOW (ref 3.5–5.2)
Alkaline Phosphatase: 187 U/L — ABNORMAL HIGH (ref 39–117)
Bilirubin, Direct: 0.1 mg/dL (ref 0.0–0.3)
Total Bilirubin: 0.4 mg/dL (ref 0.2–1.2)
Total Protein: 7.4 g/dL (ref 6.0–8.3)

## 2020-07-22 LAB — BASIC METABOLIC PANEL
BUN: 8 mg/dL (ref 6–23)
CO2: 25 mEq/L (ref 19–32)
Calcium: 8.7 mg/dL (ref 8.4–10.5)
Chloride: 98 mEq/L (ref 96–112)
Creatinine, Ser: 0.56 mg/dL (ref 0.40–1.20)
GFR: 99 mL/min (ref >60.00)
Glucose, Bld: 130 mg/dL — ABNORMAL HIGH (ref 70–99)
Potassium: 3.5 mEq/L (ref 3.5–5.1)
Sodium: 135 mEq/L (ref 135–145)

## 2020-07-22 LAB — MAGNESIUM: Magnesium: 1.5 mg/dL (ref 1.5–2.5)

## 2020-07-22 LAB — PHOSPHORUS: Phosphorus: 2 mg/dL — ABNORMAL LOW (ref 2.3–4.6)

## 2020-07-22 MED ORDER — MAGNESIUM OXIDE 400 MG PO CAPS: 400.0000 mg | ORAL_CAPSULE | Freq: Every day | ORAL | 0 refills | Status: DC

## 2020-07-22 MED ORDER — NEUTRAPHOR 1 % EX CREA: 1.0000 "application " | TOPICAL_CREAM | Freq: Two times a day (BID) | CUTANEOUS | 0 refills | Status: DC | PRN

## 2020-07-22 MED ORDER — POTASSIUM CHLORIDE ER 10 MEQ PO TBCR: 10.0000 meq | EXTENDED_RELEASE_TABLET | Freq: Every day | ORAL | 0 refills | Status: DC

## 2020-07-22 NOTE — Progress Notes (Signed)
OV 03/04/2014 Chief Complaint  Patient presents with  . Follow-up    HFU-D/c from [facility]. Pt states she has chest soreness from coughing. Pt states with the O2 her breathing has improved. C/o dyspnea when working with PT and cough with yellow mucous.    Admitted 02/16/2014 through 02/24/2014 with right-sided pneumococcal pneumonia with cavitation in the setting of smoking and rheumatoid arthritis on immune modulators. At this point in time she is off the immunomodulators. Dr. Amil Amen her rheumatologist is aware of this. She is 25% back to her baseline but still very fatigued and short of breath. She coughs a lot with home physical therapy. She brings up green mucus and sputum with her cough needed overall cough is significantly improved since discharge. Appetite is slowly returning. She works as a Art gallery manager at  [employer] and does not want to go to work till she is fully off oxygen. She is requesting to stay off work for several weeks. On 2 L there is documentation that when she walks 2 minutes up saturation still stays at 95%.   Today in the office pon room air at rest t it is 93%. Walked 40 feet and desaturated to 89% but HR 150/min per tech: I think this is all deconditioning but not sure if she has cardiomyopathy from her illness   04/02/14 Follow up  Pt returns for follow up from recent sever critical illness from streptococcal PNA , Bacteremia, sepsis 02/2014.   She had a right-sided pneumococcal pneumonia with cavitation in the setting of smoking and rheumatoid arthritis on immune modulators Today PFT show FEV1 61% , ratio 87  , FVC 55 %  , NO sign BD response, Diffusing capacity  Decreased 57% Has not smoked.  Wants to return to work soon.  CXR shows persistent RUL/LLL cavitary lesions .  Says she is feeling so much better.  No fever, chest pain, orthopnea, edema or n/v/d.  Good appetite . Seen by cards for tachycardia felt secondary to recent PN   OV 05/01/2014  Chief  Complaint  Patient presents with  . Follow-up    Pt states her breathing has improved since last OV. Pt c/o prod cough with yellow and clear mucous and lower mid lumbar pain, pt thinks d/t RA. Pt doing duoneb only once a day.  Pt denies SOB.     Admitted 02/16/2014 through 02/24/2014 with right-sided pneumococcal pneumonia with cavitation in the setting of smoking and rheumatoid arthritis on immune modulators. Current followup is for the same   S: Presents with husband. Feels great overall. No fatigute. No dyspnea. Able to do all ADLs. Wants to return to work now and is asking for release to work. In terms of RA, she feels early morning stiffness of hand joint is returning. SHe is seeing Dr Amil Amen 05/01/14 and will discuss with him about return to immunomodulators. There are no other issues. CXR done after she left and at time of this note:    Dg Chest 2 View  04/30/2014   CLINICAL DATA:  Followup cavitary pneumonia  EXAM: CHEST  2 VIEW  COMPARISON:  04/02/2014  FINDINGS: Normal heart size, mediastinal contours, and pulmonary vascularity.  Patchy infiltrates are identified in RIGHT upper lobe and LEFT lower lobe consistent with pneumonia.  Cavitary foci identified previously in the in both the RIGHT upper and LEFT lower lobes are still seen, the less evident in the RIGHT upper lobe.  Scattered interstitial prominence again identified.  No gross pleural  effusion or pneumothorax.  Bones unremarkable.  IMPRESSION: Persistent infiltrates are identified in the RIGHT upper lobe and LEFT lower lobe with persistent visualization of cavitary foci.   Electronically Signed   By: Lavonia Dana M.D.   On: 04/30/2014 19:09      OV 08/09/2014  Chief Complaint  Patient presents with  . Follow-up    Pt here after CT scan. Pt denies change in breathing since last OV. Pt c/o prod cough with clear and green mucus.     Presents for Pneumococcal pneumonia followup that developed in setting of RA and Simponi  She  is being maintained on ARAVA since her pneumococcal pneumonia earlier in 2013.  Now she is free of symptoms of respiratory tree. CT 08/08/14 shows further improvement but has architectural distortion. HEr RA is now significantly  active - per her and my talking 08/09/2014 with Dr Amil Amen. Patient is very apprehensive of starting ORENCIA recommended by Dr Amil Amen due to increased aecopd risk (She does not have copd), recurrent infection esp in setting of focal archiectrual distortion and recent pneumonia. She is very scared of dealing with RA disability v infection risk   Social and past and family: Mom had RA. She is upset abotu her disease  CT 08/08/14  IMPRESSION: 1. Emphysema with scattered scarring, volume loss, scattered bulla (some of which are still inflamed and with a somewhat cavitary appearance), and bilateral airway thickening. Much of this represents residua from prior multi lobar cavitary pneumonia, and is improved compared to the prior July CT scan. Given the degree of architectural distortion, scattered nodularity and opacities, and and irregular scarring, CT is not able to clear the lung of the possibility of malignancy, and surveillance imaging may be warranted.   Electronically Signed  By: Sherryl Barters M.D.  On: 08/08/2014 15:32  OV 11/06/2014  Chief Complaint  Patient presents with  . Follow-up    Coughing up whitish colored phlegm, SOB with walking, not always, sometimes.      Presents for Pneumococcal pneumonia followup that developed in setting of RA and Simponi June 2015, with resultant architectural distortion in her lung anatomy  Last seen December 2015. Since then overall she's doing well. At baseline she only has mild cough with occasional white sputum. She also has exertional dyspnea when she climbs up a hill but clearly in cold air he did this is relieved by rest. She is currently on 2 immunomodulators for her rheumatoid arthritis Orencia and Arava and  this is helping her joints. She's not had any respiratory complications so far. I notice that she is not on Bactrim prophylaxis but she is concerned about the side effects. And she wants to talk to her rheumatologist Dr. Amil Amen about this  Past, Family, Social reviewed: no change since last visit   OV 02/10/2015  Chief Complaint  Patient presents with  . Follow-up    Pt stated her breathing feels it has improved since last OV. Pt here after PFT. Pt stated she has a little more trouble breathing when it is hot out. Pt c/o mild prod cough with clear and small light green mucus. Pt denies CP/tightness.    Follow-up chronic destructive pulmonary to changes following pneumococcal pneumonia in 2015 in the setting of previous heavy smoking and immunomodulatory treatment for rheumatoid arthritis   Since seeing me last in March 2016 she feels that the albuterol inhaler that I tried for dyspnea and exertion and wheezing has helped. Dyspnea still persists. It is unchanged. It is  mild. However it is improved with albuterol which she takes prior to exertion. There is no associated cough or. Wheezing is improved. No hemoptysis no fever no chills .Outside chart from Dr. Amil Amen her rheumatologist dated 02/04/2015 reviewed. She reported to him that she is compliant on Ornecia and arava with good relief and joint pain. Her dyspnea is mild chronic and stable. His advice for her to continue the same. He suggested that I look into her chronic residual dyspnea at this visit   Last pulmonary imaging with CT scan of the chest December 2015:residual changes + along with emphysema; report reviewed. Image not visualized  Pulmonary function test 02/10/2015 today personally reviewed image: FEV1 prebronchodilator 1.7 L/64%, ratio 69.  FEV1 postbronchodilator is 1.9 L/72% and is a 12% positive response with broncho-dilator., FVC 2.4 L/72% and ratio of 79. Total lung capacity is 3.76 L/76%. DLCO is 18.5/80%. PFTs are consistent  with restriction and a normal DLCO but in the prebronchodilator she has mild obstruction   OV 08/11/2015  Chief Complaint  Patient presents with  . Follow-up    Pt states her breathing has improved since last OV. Pt states she has only been using the spiriva as needed. Pt c/o prod cough with clear mucus and DOE with heavy exertion. pt denies CP/tightness.     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 She continues to do well. She is now on leflunomide and Orencia for her rheumatoid arthritis. With this rheumatoid arthritis symptoms have improved. She is follows up with Dr. Amil Amen in his new rheumatology practice [facility]. She feels that her wheezing has significantly improved. Shortness of breath significantly improved. She is not taking Spiriva anymore. She reported some hoarseness with it. She wants to continue to watch herself without Spiriva. She is up-to-date with her flu shot.   OV 02/09/2016  Chief Complaint  Patient presents with  . Follow-up    Pt reports that her breathing is doing well. Pt c/o occasional dry cough that is worse when using a vape for recreational use. Pt also c/o occasional wheeze with hot, humid weather and exertion. Pt does use albuterol HFA and her symptoms improve. Pt denies SOB/CP/tightness.        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 Doing well but has started smoking again though is nicotine-free vape smoking now. She did not want spiriva due to dpi and throat irritation. But she is using albuterol atleast daily. She is having exertional wheeze but otherwise okayt. Maureen Chatters  continues with Dr Amil Amen. She tells me she never had shingles vaccine. Otherwise well. Working at [employer] . Does not want cxr 02/09/2016 for fu. Wants to defer till next visit   OV 12/08/2017  Chief Complaint  Patient presents with  . Follow-up    Last seen 02/09/16.  Pt states she has been doing well since last visit and denies any complaints.      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  She went through a separation and divorce and this time.  Smoking continues  to be in remission.  At last visit she was smoking but now is in remission.  She continues to work at the [employer].  For her rheumatoid arthritis she has been switched out of orcencia to [place] and this is working well for her.  She only has some mild pain.  In terms of her respiratory symptoms she hardly has any symptoms.  Definitely no cough.  She has mild dyspnea on exertion when working out in the yard but this is stable.  Review of the chart shows that her last echocardiogram was in 2015.  Her last chest x-ray or imaging was in 2016 and so was a pulmonary function test.  She wants to make sure her lungs are doing okay at this point.   OV 01/27/2018  Chief Complaint  Patient presents with  . Follow-up    Breathing is improved since last OV. Still has some coughing in the mornings. Denies chest tightness, wheezing.   Kindel Rochefort presents for follow-up after testing.  In the interim she continues to be stable.  She only has mild dyspnea on exertion when she does gardening.  For this she uses albuterol as needed she does not want to upscale on her inhalers.  Testing showed atrial septal aneurysm and coronary artery calcification.  Cardiology has reassured her on both accounts.  In terms of the lung function that seems to be  a decline in FEV1 compared to 2016 but very similar to 2015.  DLCO is stable all along.  She feels stable.  CT scan of the chest shows improved bronchiectasis compared to few years ago.  The only issue is that in the right upper lobe that seems to be a cavity with a potential aspergilloma although this is totally uncertain at this stage.  She does not have any hemoptysis or any symptoms.  She is immunocompromised on rheumatoid arthritis medication Orencia at this point.  Apparently the Morrie Sheldon was stopped.   Results for SUBRENA, DEVEREUX (MRN [id number]) as of 01/27/2018 10:48  Ref. Range 04/02/2014 11:42 02/10/2015 13:49 01/27/2018 09:57  FEV1-Post Latest Units: L 1.68 1.89 1.63  FEV1-%Pred-Post Latest Units: % 64 73 64  FEV1-%Change-Post Latest Units: % _0 Results for APRYLE, STOWELL (MRN [id number]) as of 01/27/2018 10:48  Ref. Range 04/02/2014 11:42 02/10/2015 13:49 01/27/2018 09:57  DLCO unc Latest Units: ml/min/mmHg 13.15 18.50 18.02  DLCO unc % pred Latest Units: % 57 80 78    IMPRESSION: CT chest 1. Widespread areas of bronchiectasis and scarring, generally similar to the prior examination. Two of the thick-walled cavitary areas in the right upper lobe seen on the prior study have resolved, however, there is a new area of scarring in the right upper lobe near the apex which contains some internal soft tissue which is favored to represent some sloughed necrotic lung tissue, although an aspergilloma is not entirely excluded. Close attention on future follow-up imaging is recommended. 2. Possible honeycombing in the extreme lung bases which could indicate early interstitial lung disease. Future follow-up examination should be performed is high-resolution chest CTs to better evaluate these findings. 3. Cholelithiasis. 4. Aortic atherosclerosis, in addition to 2 vessel coronary artery disease. Please note that although the presence of coronary artery calcium documents the presence  of coronary artery disease, the severity of this disease and any potential stenosis cannot be assessed on this non-gated CT examination. Assessment for potential risk factor modification, dietary therapy or pharmacologic therapy may be warranted, if clinically indicated.  Aortic Atherosclerosis (ICD10-I70.0).   Electronically Signed   By: Vinnie Langton M.D.   On: 12/21/2017 13:00   ECHO 12/12/17 Study Conclusions  - Left ventricle: The cavity size was normal. Wall thickness was   normal. Systolic function was vigorous. The estimated ejection   fraction was in the range of 65% to 70%. Wall motion was normal;   there were no regional wall motion abnormalities. Doppler   parameters are consistent with abnormal left ventricular   relaxation (grade 1 diastolic dysfunction). - Atrial septum: There was an atrial septal aneurysm.  Impressions:  - Vigorous LV systolic function, mild diastolic dysfunction.  OV 08/11/2018  Subjective:  Patient ID: Kimberly Shelton, female , DOB: 12/08/1960 , age 54 y.o. , MRN: [id number] , ADDRESS: [place] [id number]   08/11/2018 -   Chief Complaint  Patient presents with  . Follow-up    f/u bronchiectasis, PFT today, pt doing well , only cough in tha am with little mucus     HPI AMBIKA ZETTLEMOYER 59 y.o. -presents for follow-up of bronchiectasis suffered in 2015 following really severe pneumococcal pneumonia in the setting of immunosuppression due to rheumatoid arthritis.  This routine follow-up.  When I saw her in the spring 2019 we did a CT scan of the chest and there was suggestion of possible aspergillus cavity.  Did Aspergillus antibody and she was positive for IgG.  Therefore referred her to infectious diseases.  I reviewed the note.  Given overall stability expectant approach has been adopted.  At this point in time she feels stable using albuterol as needed.  Vaccine records indicate she could benefit from both  shingles vaccine and Prevnar.  She seems to be under the assumption that shingles vaccine is only after age 62.  I reviewed the literature and it is actually indicated for people over 50.  However she should only get the new Shingrix vaccine which is inactivated.  Prevnar is also indicated for her given her previous pneumococcal pneumonia and her immunosuppressed status.  Coronary artery calcification on CT scan: This was seen in April 2019.  Since then she did see cardiology.  They are aware of this and the place her on follow-up.  She does not have any chest pain currently.    OV 09/13/2019  Subjective:  Patient ID: Kimberly Shelton, female , DOB: 02-May-1961 , age 87 y.o. , MRN: [id number] , ADDRESS: [place] [id number]   09/13/2019 -   Chief Complaint  Patient presents with  . Follow-up    Pt states she has been spitting up bloody mucus every morning and states her throat is also raw feeling. Pt was diagnosed with covid 11/19 and did okay with that other than losing her sense of taste and smell.   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 Gerri Spore  HPI Kimberly Shelton 59 y.o. -returns for follow-up.  Last seen in December 2019.  After that in the summer 2020 CT scan of the chest showed progression.  I referred her to Dr. Scharlene Gloss who placed her on voriconazole.  She is currently finished her treatment with voriconazole but she tells me for the last 6 months she continues to have early morning mild hemoptysis versus pink sputum.  She says  that despite the voriconazole this persist.  In early November 2020/mid November 2020 she picked up COVID-19 while singing in a church along with her sister who also had COVID-19.  One of the  person died in that cluster.  That happens to be my patient.  She tells me since the COVID-19 for which she did not get hospitalized she has had slight increase in cough and slight increase in hemoptysis and ongoing fatigue.  For her rheumatoid arthritis she is only on prednisone she is unable to take any immunomodulators because of her pulmonary issues.  The disease has gotten progressive with the onset of rheumatoid nodules in her bilateral elbows.  She has gained weight in the pandemic because of being sedentary.   Review of the chart indicates last echo was in 2019.  She has a murmur.  Her last CT scan of the chest was in August 2020.   She wants to know if it is okay to go back on immunosuppressive's.- d/w Dr Amil Amen -  DMARD prob ok but not biologics   OV 10/11/2019  Subjective:  Patient ID: Kimberly Shelton, female , DOB: [birth year] , age 22 y.o. , MRN: [id number] , ADDRESS: [place] [id number]   10/11/2019 -   Chief Complaint  Patient presents with  . 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 Gerri Spore  #small hiatl hernia  #coronary artery calcification and baseline heart murmur - dr Angelena Form   HPI MATA ROWEN 59 y.o. -presents to discuss the test results in the setting of above medical issues.  She again tells me that she started having hemoptysis 6 months ago or so.  She was then given voriconazole.  She says with that the pink slight hemoptysis did not resolve.  Then she finished an adequate course of voriconazole but the light streaky pink hemoptysis is continued.  Then several days ago she had like one dark clot.  She is now given that sputum for  AFB cultures are pending smear is negative.  Otherwise new problem is she is constipated for 3 days.  She is also taking her prednisone at 5 mg/day because it is causing increased weight gain.  Her arthralgia is worse.  She plans to see rheumatology in a week or 2.  New issue is constipation for the last 3 days.  However in review of the chart she has had constipation in the past but she is expressing it to me for the first time.  She also wants treatment for this.  In terms of CT scan of the chest: Radiologist calling for presence of UIP/ILD with progression.  In addition she has a fibrocavitary fibronodular changes with aspergilloma.  She also has coronary artery calcification  Echocardiogram shows left ventricular hypertrophy with normal ejection fraction but there is a description of left to right interatrial shunt.  I think this is a new finding.  IMPRESSION Jan 2021 1. Spectrum of findings at the lung bases worrisome for progressive fibrotic interstitial lung disease with mild-to-moderate honeycombing, clearly progressive compared to 2015 and 2019 chest CT studies, with more mild progression since 05/02/2019 chest CT. Findings are consistent with UIP per consensus guidelines: Diagnosis of Idiopathic Pulmonary Fibrosis: An Official  ATS/ERS/JRS/ALAT Clinical Practice Guideline. [place], Iss 5, [phone], May 07 2017. 2. Extensive chronic fibrocavitary and fibronodular changes in the right greater than left lung apices with evidence of mycetomas in the large irregular thick walled cavitary region in the apical right lung, which has mildly increased in size. 3. One vessel coronary atherosclerosis. 4. Stable mild right paratracheal lymphadenopathy, most compatible with benign reactive etiology. 5. Small hiatal hernia. 6. Cholelithiasis.  Aortic Atherosclerosis (ICD10-I70.0) and Emphysema (ICD10-J43.9).   Electronically Signed   By: Ilona Sorrel M.D.   On:  09/26/2019 16:43  ECHO Jan 2021  -- LVH ef 65% ROS - per HPI   OV 10/29/2019  Subjective:  Patient ID: Kimberly Shelton, female , DOB: 1961-04-15 , age 46 y.o. , MRN: [id number] , ADDRESS: [place] [id number]   10/29/2019 -   Chief Complaint  Patient presents with  . Follow-up    Pt states she has been doing okay since last visit. States she is still occ coughing up blood. Pt states that she is sore/achey in the mornings prior to getting up and will take aleve to help with that.   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 Linus Salmons  #ILD/UIP  - progressive UIP pattern - on HRCt April 2019 -> 2021 Jan  -PFT testing incongruent  #small hiatl hernia  #coronary artery calcification and baseline heart murmur - dr Angelena Form  #RA  - Dr Amil Amen HPI Kimberly Shelton 59 y.o. -returns for follow-up.  Last seen in early in February 2021.  Several concerns at that time.  In the interim I presented her case at the multidisciplinary case conference.  The determination was that she had early ILD changes even in April 2019 and is now progressed to a definite UIP pattern.  This is a new finding for [place].  Concern of the conference is that she has rheumatoid arthritis ILD/UIP pattern.  And this is progressive.  There for this visit is to discuss specifically the UIP issues but also round of the other issues.  Again with this diagnosis.  Also discussed the potential for participation in a research registry.  She is interested.  We also discussed antifibrotic therapy nintedanib.  Discussed contraindications which she does not  seem to have.  She does not have colitis.  Infectious constipation.  She does not have any bleeding diathesis.  Not on blood thinners.  She is on voriconazole and Plaquenil and now with nintedanib she will require intensive liver function test monitoring.  She is open for this.  She understands this drug is to help prevent fibrosis getting worse but will not manage her symptoms.  In terms of her ongoing hemoptysis with fibrocavitary bronchiectasis and aspergilloma: She revisited with Dr. Gerri Spore.  I reviewed the note.  Patient has been restarted on voriconazole 3 days ago on October 26, 2019.  After this the hemoptysis might be slightly better.  Multidisciplinary case conference consideration is for bronchoscopy with lavage but again this has been disrupted because of the COVID-19 pandemic.  She thinks hemoptysis is slightly better but is still present.   In terms of rheumatoid arthritis: She  visited with Dr. Amil Amen.  I reviewed the note.  She has been asked to continue with the prednisone 5 mg/day.  Plaquenil was been added because of her pain.  She is extremely upset about her rheumatoid arthritis nodules in the bilateral elbow region.  Also with ongoing pain.  In terms of coronary artery calcification based on heart murmur: I referred her to Dr. Angelena Form.  Was rereferral.  I do not see an appointment made.        OV 01/01/2020  Subjective:  Patient ID: Kimberly Shelton, female , DOB: 1961/06/23 , age 59 y.o. , MRN: [id number] , ADDRESS: [place] [id number] Follow-up   01/01/2020 -   Chief Complaint  Patient presents with  . Follow-up    Pt states she has been doing okay since last visit. Pt states she has been having some trouble with the Esbriet medication.      HPI LEANAH KOLANDER 59 y.o. -returns for follow-up of the above issues.  The main focus of this visit is to monitor her for pirfenidone tolerance.  She started the pirfenidone approximately December 17, 2019.  She says she has done well with 1 tablet 3 times daily.  She is adequately spacing and taking it with food.  However the following week when she went up to 2 tablets 3 times daily she started having significant amount of nausea and vomiting.  Therefore she stopped.  No abdominal pain or diarrhea.  She is intentionally lost some weight.  She thinks the nausea and vomiting was related to taking the second shot of the Covid vaccine.  Nevertheless she is apprehensive about going up on the pirfenidone.  Currently tolerating it well in her second or third week of 1 tablet 3 times daily.  She wants further advice.  She supposed to have pulmonary function testing today.  Her last liver function testing was December 10, 2019 and her AST was high normal.  In terms of hemoptysis: Mild trace hemoptysis continues to be present  In terms of her aspergilloma: She is continues to be on voriconazole  In terms of rheumatoid arthritis she is on Plaquenil and prednisone 5 mg/day.  She visit with Dr. Amil Amen December 27, 2019 notes reviewed  In terms of a cardiac murmur: She says she saw Dr. Angelena Form nurse practitioner but I cannot review this note I do not know why.   OV 02/14/2020  Subjective:  Patient ID: Kimberly Shelton, female , DOB: [birth year] , age 34 y.o. , MRN: [id number] , ADDRESS: [place] [id number]   02/14/2020 -   Chief Complaint  Patient presents with  . Follow-up    Pt states she has been doing okay since last visit. 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 Linus Salmons  #ILD/UIP  - progressive UIP pattern - on HRCt April 2019 -> 2021 Jan  - started esbriet approx 12/10/19 -stopped May 2021 due to GI side effects  -  Awaiting nintedanib and June 2021.  #small hiatl hernia  #coronary artery calcification and baseline heart murmur - dr Angelena Form  #RA  - Dr Amil Amen   - plaquenil   - prednismoe 5 mg  -Severe pain  -Unable to take immunosuppressive drugs because of respiratory infection.   HPI FRANCENA ZENDER 59 y.o. -returns for follow-up. This visit was supposed to be to evaluate how she is doing off the pirfenidone having started nintedanib. Pirfenidone she had significant GI intolerance. She is doing better from a GI tolerance standpoint without the pirfenidone but she is having no luck getting nintedanib approved. A pharmacy is working on this. She continues to have intermittent hemoptysis. She has bronchiectasis from her 71 pneumonia. That is persistently infected with aspergilloma. On top of this she is also got pulmonary fibrosis. This is preventing him from getting immunosuppressive's. As a result she is only on Plaquenil and low-dose prednisone. Currently she is dealing with severe pain along with a daily intermittent hemoptysis. She is barely able to sit or stand. She not able to likes her wrist completely. She has new onset rheumatoid nodule in her feet now. The rheumatoid nodules in her elbows are already present. She has difficulty putting her clothes on. At this point in time she is completely unemployed. Her mortgage, her food her clothes and her car loan is being paid for by family. Given her health insurance is being paid for by family. She is not on Medicaid. She applied for Social Security disability but got denied. She is upset and she is in tears. Her FEV1 is less than 70%. Lung function testing serial as documented below. The DLCO today is  inaccurate.    OV 05/15/2020   Subjective:  Patient ID: Kimberly Shelton, female , DOB: [birth year], age 9 y.o. years. , MRN: [id number],  ADDRESS: [place] [address] PCP  Eulas Post, MD Providers : Treatment Team:  Attending Provider: Brand Males, MD   #bronchiectasis with complication by fibrocavitary lung disease and persistent infection with aspergilloma  #hemoptysis  - suffered in 2015 following severe pneumococcal pneumonia in the setting of immunosuppression from rheumatoid arthritis  - Course complicated by aspergilloma in the right upper lobe in the setting of pulmonary cavity-diagnosis made by positive IgG  - sp bronch 11/27/19  BAL RUL - 94% polus, - culture negative  = normal ENT ed[place] 11/20/19 Dr Lucia Gaskins   -followed by Dr. Linus Salmons  #ILD/UIP  - progressive UIP pattern - on HRCt April 2019 -> 2021 Jan  - started esbriet approx 12/10/19 -stopped May 2021 due to GI side effects  -Awaiting nintedanib and June 2021.  #small hiatl hernia  #coronary artery calcification and baseline heart murmur - dr Angelena Form  #RA  - Dr Amil Amen   - plaquenil   - prednismoe 5 mg  -Severe pain  -Unable to take immunosuppressive drugs because of respiratory infection.   Chief Complaint  Patient presents with  . Follow-up    pt is spitting up blood       HPI ANTONIETA SLAVEN 59 y.o. -returns for follow-up of the above issues.  She is doing stable.  She is exercising 4 miles on exercise bike but her finger deformities are so bad she is not able to grip things and she has significant amount of pain.  She continues to have daily hemoptysis associated with fatigue.  The hemoptysis is mild.  It is stable.  She is worried about this.  She  is now on nintedanib for the last 6 weeks.  She has occasional vomiting once or twice a month with gagging of food but no diarrhea or other side effects.  She is a requirement of lab work.  She has had a Covid vaccine but is in  requirement of a booster.  She has not had a flu shot.  She is awaiting Social Security disability document.   After she left I was able to call interventional radiology IR clin[facility] Dr Catha Brow.  He reviewed the scans and visualized it himself.  He feels patient has bilateral cavity with predominant cavity on the right side.  He thinks patient's hemoptysis is coming from the right side.  He thinks given the patient's complexity patient might benefit from IR guided embolization on the right side.  However he said that patient has to be seen in the clinic first.  Therefore we will make a referral.    OV 07/22/2020   Subjective:  Patient ID: Kimberly Shelton, female , DOB: Feb 23, 1961, age 39 y.o. years. , MRN: [id number],  ADDRESS: [place] [id number] PCP  Eulas Post, MD Providers : Treatment Team:  Attending Provider: Brand Males, MD Patient Care Team: Eulas Post, MD as PCP - General Burnell Blanks, MD as PCP - Cardiology (Cardiology) Brand Males, MD as Consulting Physician (Pulmonary Disease) Comer, Okey Regal, MD as Consulting Physician (Infectious Diseases) Burnell Blanks, MD as Consulting Physician (Cardiology) Helayne Seminole, MD as Consulting Physician (Otolaryngology) Olena Leatherwood, MD as Referring Physician (Rheumatology) Megan Salon, MD as Consulting Physician (Gynecology) Gastroenterologist Dr. Verdia Kuba  #bronchiectasis with complication by fibrocavitary lung disease and persistent infection with aspergilloma  #hemoptysis  - suffered in 2015 following severe pneumococcal pneumonia in the setting of immunosuppression from rheumatoid arthritis  - Course complicated by aspergilloma in the right upper lobe in the setting of pulmonary cavity-diagnosis made by positive IgG  - sp bronch 11/27/19  BAL RUL - 94% polus, - culture negative  = normal ENT ed[place] 11/20/19 Dr Lucia Gaskins   -followed by  Dr. Linus Salmons  #ILD/UIP  - progressive UIP pattern - on HRCt April 2019 -> 2021 Jan  - started esbriet approx 12/10/19 -stopped May 2021 due to GI side effects  -Awaiting nintedanib and June 2021.  #small hiatl hernia  #coronary artery calcification and baseline heart murmur - dr Angelena Form  #RA  - Dr Amil Amen   - plaquenil   - prednismoe 5 mg  -Severe pain  -Unable to take immunosuppressive drugs because of respiratory infection.  Chief Complaint  Patient presents with  . Hospitalization Follow-up    Iron to help build system back up, got blood transfusion, not eating a lot, productive cough with yellow sputum and has some blood, possible sinus infection       HPI LEONDA CRISTO 59 y.o. -returns for follow-up.  Last seen in September 2021.  After that she got acutely hospitalized towards the middle of October 2021 with acute gallbladder cholecystitis and status post cholecystectomy.  During this admission had vomiting of blood.  According to her she presented with vomiting of blood.  Endoscopy showed and I reviewed the report showed several multiple greater than 20 nonbleeding duodenal ulcers and also esophageal ulcers.  She was anemic and had to be transfused blood.  I saw her sister as the patient yesterday and the sister was concerned about the patient feeling fatigued and therefore brought her  in acutely after doing some labs today.  Patient bleeding ulcers happened in the setting of nintedanib that was started in summer 2021.  But she is also on prednisone.  She also states that significant amount of emotional stress try to get a disability    Patient tells me since hospitalization she is quite fatigued she feels wiped out even after taking a shower.  CMA noticed that when she walked the patient and patient desaturated this is a new finding.  When we walked her again the desaturation was reproduced.  Patient needed 2 L of nasal cannula to correct.  She is feeling exhausted.  Labs were  done shows anemia hemoglobin 9.2 g% but this is improved.  Also mild electrolyte imbalance showing slightly low magnesium slightly low potassium and slightly low phosphorus.  Of note she is known to have aspergilloma.  She continues to have daily hemoptysis.  Last visit when I saw her I referred her to interventional radiology.  But she could not make it to this appointment because of the admission for GI bleeding issues.  She then said she did not want to go to interventional radiology because she thought the hemoptysis resolved.  In talking to her today it appears only the hematemesis resolved.  She still has mild hemoptysis but she at this point in time wants to continue with a GI work-up and make sure the bleeding ulcers of fine before resorting to interventional radiology.  Of note in terms of her disability she has gotten her disability.  She feels emotional stress and anxiety because of all this.    SYMPTOM SCALE - ILD 02/14/2020  07/22/2020 138#  O2 use ra   Shortness of Breath 0 -> 5 scale with 5 being worst (score 6 If unable to do)   At rest 3   Simple tasks - showers, clothes change, eating, shaving 3   Household (dishes, doing bed, laundry) 3   Shopping 3   Walking level at own pace 3   Walking up Stairs 4   Total (30-36) Dyspnea Score 19   How bad is your cough? 3   How bad is your fatigue 4   How bad is nausea 4   How bad is vomiting?  2   How bad is diarrhea? 2   How bad is anxiety? 4   How bad is depression 4    Simple office walk 185 feet x  3 laps goal with forehead probe 05/15/2020  07/22/2020   O2 used ra ra  Number laps completed 3 1of 3 only an ddesaturaed  Comments about pace fast   Resting Pulse Ox/HR 100% and 77/min 96% and 107/min  Final Pulse Ox/HR 99% and 111/min 86% and 128/min  Desaturated </= 88% no yes  Desaturated <= 3% points n Yes, 10 point  Got Tachycardic >/= 90/min yes yes  Symptoms at end of test none dyspnea  Miscellaneous comments X -  does 4 miles on exer bike dailty Needed [facility] to correct to 94%      PFT Results Latest Ref Rng & Units 02/13/2020 08/11/2018 01/27/2018 02/10/2015 04/02/2014  FVC-Pre L 2.20 2.26 2.22 2.42 1.85  FVC-Predicted Pre % 68 69 68 73 55  FVC-Post L - - 2.16 2.40 1.82  FVC-Predicted Post % - - 66 72 54  Pre FEV1/FVC % % 78 71 71 69 87  Post FEV1/FCV % % - - 75 79 92  FEV1-Pre L 1.72 1.60 1.58 1.68 1.62  FEV1-Predicted Pre %  69 63 62 64 61  FEV1-Post L - - 1.63 1.89 1.68  DLCO uncorrected ml/min/mmHg 16.24 15.53 18.02 18.50 13.15  DLCO UNC% % 83 67 78 80 57  DLCO corrected ml/min/mmHg 29.62 - - - -  DLCO COR %Predicted % 151 - - - -  DLVA Predicted % 213 101 120 107 101  TLC L - - - 3.76 3.52  TLC % Predicted % - - - 76 71  RV % Predicted % - - - 68 102    LABS  Results for OKTOBER, GLAZER (MRN [id number]) as of 07/22/2020 12:16  Ref. Range 06/10/2020 03:36 06/11/2020 03:43 06/11/2020 18:01 06/12/2020 03:18 06/12/2020 14:59 06/13/2020 07:29 06/14/2020 03:37 06/14/2020 18:40 06/15/2020 05:44 06/16/2020 05:06 07/22/2020 10:10  Hemoglobin Latest Ref Range: 12.0 - 15.0 g/dL 10.8 (L) 7.8 (L)  7.0 (L) 8.7 (L) 8.9 (L) 8.5 (L) 8.5 (L) 8.2 (L) 8.2 (L) 9.2 (L)    PULMONARY No results for input(s): PHART, PCO2ART, PO2ART, HCO3, TCO2, O2SAT in the last 168 hours.  Invalid input(s): PCO2, PO2  CBC Recent Labs  Lab 07/22/20 1010  HGB 9.2*  HCT 29.3*  WBC 14.3*  PLT 667.0*    COAGULATION No results for input(s): INR in the last 168 hours.  CARDIAC  No results for input(s): TROPONINI in the last 168 hours. No results for input(s): PROBNP in the last 168 hours.   CHEMISTRY Recent Labs  Lab 07/22/20 1010  NA 135  K 3.5  CL 98  CO2 25  GLUCOSE 130*  BUN 8  CREATININE 0.56  CALCIUM 8.7  MG 1.5  PHOS 2.0*   Estimated Creatinine Clearance: 66.8 mL/min (by C-G formula based on SCr of 0.56 mg/dL).   LIVER Recent Labs  Lab 07/22/20 1010  AST 11  ALT 11  ALKPHOS 187*  BILITOT 0.4   PROT 7.4  ALBUMIN 3.1*     INFECTIOUS No results for input(s): LATICACIDVEN, PROCALCITON in the last 168 hours.   ENDOCRINE CBG (last 3)  No results for input(s): GLUCAP in the last 72 hours.       IMAGING x48h  - image(s) personally visualized  -   highlighted in bold No results found.    has a past medical history of Abnormal Pap smear (09/2006), CHF (congestive heart failure) ([place]), Foot fracture, left (10/16), History of pneumonia (02/09/2016), Pneumonia, and RA (rheumatoid arthritis) ([place]) (07/2009).   reports that she quit smoking about 6 years ago. Her smoking use included cigarettes and e-cigarettes. She started smoking about 36 years ago. She has a 30.00 pack-year smoking history. She has never used smokeless tobacco.  Past Surgical History:  Procedure Laterality Date  . BIOPSY  06/15/2020   Procedure: BIOPSY;  Surgeon: Yetta Flock, MD;  Location: [facility];  Service: Gastroenterology;;  . BRONCHIAL WASHINGS  11/27/2019   Procedure: BRONCHIAL WASHINGS;  Surgeon: Brand Males, MD;  Location: [facility];  Service: Cardiopulmonary;;  . CERVICAL BIOPSY  W/ LOOP ELECTRODE EXCISION  2008   CIN 3  . CERVICAL CONIZATION W/BX N/A 12/15/2015   Procedure: CONIZATION CERVIX WITH BIOPSY ;  Surgeon: Megan Salon, MD;  Location: [facility];  Service: Gynecology;  Laterality: N/A;  POSSIBLE COLD KNIFE CONE.  Please have the microscope in the room  . CHOLECYSTECTOMY N/A 06/10/2020   Procedure: LAPAROSCOPIC CHOLECYSTECTOMY;  Surgeon: Jovita Kussmaul, MD;  Location: [facility];  Service: General;  Laterality: N/A;  . COLONOSCOPY    . ESOPHAGOGASTRODUODENOSCOPY (EGD) WITH PROPOFOL N/A 06/15/2020  Procedure: ESOPHAGOGASTRODUODENOSCOPY (EGD) WITH PROPOFOL;  Surgeon: Yetta Flock, MD;  Location: [facility];  Service: Gastroenterology;  Laterality: N/A;  . IR RADIOLOGIST EVAL & MGMT  05/27/2020  . LEEP N/A 12/15/2015   Procedure: LOOP ELECTROSURGICAL EXCISION PROCEDURE  (LEEP) with colpo;  Surgeon: Megan Salon, MD;  Location: [facility];  Service: Gynecology;  Laterality: N/A;  . PILONIDAL CYST EXCISION  1999   I&D  . VIDEO BRONCHOSCOPY N/A 11/27/2019   Procedure: VIDEO BRONCHOSCOPY WITHOUT FLUORO;  Surgeon: Brand Males, MD;  Location: [facility];  Service: Cardiopulmonary;  Laterality: N/A;  . WISDOM TOOTH EXTRACTION      Allergies  Allergen Reactions  . Bee Venom Anaphylaxis  . Benadryl Allergy [Diphenhydramine Hcl]     Went into afib  . Codeine Other (See Comments)    "seeing spots"  . Pirfenidone Other (See Comments)    GI side effects  . Penicillins Rash    Has patient had a PCN reaction causing immediate rash, facial/tongue/throat swelling, SOB or lightheadedness with hypotension: No Has patient had a PCN reaction causing severe rash involving mucus membranes or skin necrosis: No Has patient had a PCN reaction that required hospitalization No Has patient had a PCN reaction occurring within the last 10 years: Yes If all of the above answers are "NO", then may proceed with Cephalosporin use.     Immunization History  Administered Date(s) Administered  . Influenza Inj Mdck Quad Pf 05/25/2019  . Influenza,inj,Quad PF,6+ Mos 06/25/2015, 05/31/2017, 05/29/2018, 05/16/2020  . Influenza-Unspecified 06/06/2014  . PFIZER SARS-COV-2 Vaccination 11/29/2019, 12/22/2019, 07/11/2020  . Pneumococcal Conjugate-13 08/11/2018  . Pneumococcal Polysaccharide-23 02/18/2014  . Tdap 09/01/2016    Family History  Problem Relation Age of Onset  . Diabetes Mother   . Rheum arthritis Mother   . Diabetes Father   . COPD Father   . Bronchitis Father   . Cancer Brother        Lung  . Breast cancer Maternal Aunt 68  . CAD Neg Hx      Current Outpatient Medications:  .  acetaminophen (TYLENOL) 325 MG tablet, Take 2 tablets (650 mg total) by mouth every 6 (six) hours as needed for mild pain (or Fever >/= 101)., Disp: , Rfl:  .  albuterol (VENTOLIN HFA)  108 (90 Base) MCG/ACT inhaler, INHALE 2 PUFFS INTO THE LUNGS EVERY 6 HOURS AS NEEDED FOR WHEEZING OR SHORTNESS OF BREATH (Patient taking differently: Inhale 2 puffs into the lungs every 6 (six) hours as needed for wheezing or shortness of breath. ), Disp: 8.5 g, Rfl: 3 .  diltiazem (CARDIZEM CD) 120 MG 24 hr capsule, Take 1 capsule (120 mg total) by mouth as needed. Hold for sbp <100 or hr < 60/min, Disp: 30 capsule, Rfl: 1 .  docusate sodium (COLACE) 100 MG capsule, Take 1 capsule (100 mg total) by mouth daily as needed., Disp: 30 capsule, Rfl: 2 .  EPINEPHrine (EPIPEN 2-PAK) 0.3 mg/0.3 mL IJ SOAJ injection, Inject 0.3 mg into the muscle as needed for anaphylaxis. , Disp: , Rfl:  .  escitalopram (LEXAPRO) 10 MG tablet, TAKE 1 TABLET BY MOUTH EVERY DAY (Patient taking differently: Take 10 mg by mouth daily. ), Disp: 90 tablet, Rfl: 3 .  ferrous sulfate 325 (65 FE) MG EC tablet, Take 1 tablet (325 mg total) by mouth 2 (two) times daily., Disp: 60 tablet, Rfl: 0 .  hydroxychloroquine (PLAQUENIL) 200 MG tablet, Take 200 mg by mouth 2 (two) times daily., Disp: , Rfl:  .  ondansetron (ZOFRAN) 4 MG tablet, Take 1 tablet (4 mg total) by mouth 3 (three) times daily as needed for nausea., Disp: 90 tablet, Rfl: 3 .  oxyCODONE (OXY IR/ROXICODONE) 5 MG immediate release tablet, Take 1 tablet (5 mg total) by mouth every 4 (four) hours as needed for moderate pain., Disp: 15 tablet, Rfl: 0 .  pantoprazole (PROTONIX) 40 MG tablet, Take 1 tablet (40 mg total) by mouth 2 (two) times daily., Disp: 60 tablet, Rfl: 1 .  predniSONE (DELTASONE) 5 MG tablet, Take 0.5 tablets (2.5 mg total) by mouth daily with breakfast. Start on 06/15/20, Disp: , Rfl:  .  sucralfate (CARAFATE) 1 GM/10ML suspension, Take 10 mLs (1 g total) by mouth 4 (four) times daily -  with meals and at bedtime., Disp: 420 mL, Rfl: 0      Objective:   Vitals:   07/22/20 1213 07/22/20 1214  BP: 140/60   Pulse: (!) 121 (!) 103  Temp: 99.2 F (37.3 C)    TempSrc: Temporal   SpO2: (!) 86% 92%  Weight: 138 lb 9.6 oz (62.9 kg)   Height: _0  (1.6 m)     Estimated body mass index is 24.55 kg/m as calculated from the following:   Height as of this encounter: _1  (1.6 m).   Weight as of this encounter: 138 lb 9.6 oz (62.9 kg).  _2 @  Filed Weights   07/22/20 1213  Weight: 138 lb 9.6 oz (62.9 kg)     Physical Exam  General: No distress. . Anxious Neuro: Alert and Oriented x 3. GCS 15. Speech normal Psych: Pleasant Resp:  Barrel Chest - no.  Wheeze - no, Crackles - velcro at bae, No overt respiratory distress CVS: Normal heart sounds. Murmurs - no Ext: Stigmata of Connective Tissue Disease - YES - RA nodules ad deformiteis HEENT: Normal upper airway. PEERL +. No post nasal drip        Assessment:       ICD-10-CM   1. Hemoptysis  R04.2   2. Pulmonary aspergilloma (HCC)  B44.9   3. Interstitial lung disease due to connective tissue disease ([place])  J84.89    M35.9   4. Encounter for therapeutic drug monitoring  Z51.81   5. Physical deconditioning  R53.81   6. Encounter for disability examination  Z02.71   7. Blood loss anemia  D50.0   8. Electrolyte imbalance  E87.8        Plan:     Patient Instructions     ICD-10-CM   1. Hemoptysis  R04.2   2. Pulmonary aspergilloma (HCC)  B44.9   3. Interstitial lung disease due to connective tissue disease ([place])  J84.89    M35.9   4. Encounter for therapeutic drug monitoring  Z51.81   5. Physical deconditioning  R53.81   6. Encounter for disability examination  Z02.71   7. Blood loss anemia  D50.0   8. Electrolyte imbalance  E87.8      Hemoptysis - completed bronch 11/27/19 - no new culture results - mild and ongoing and due to below lung issues despite  voricanazole restart on 10/26/2019. On hold since Oct 2021  admission for GI issues - referral to IR for embolization was held up because of admission in October 2021  Plan  -Continue to monitor -If it gets  worse go to the ER -We can consider rereferral to interventional radiology but at this point based on the input we will hold off till GI issues are sorted  out  Pulmonary aspergilloma (HCC) Bronchiectasis with LRTI Iinfection of aspergilloma  -Currently off voriconazole since hospitalization in October 2021 for GI issues Plan  - per Dr Linus Salmons -   Interstitial lung disease due to connective tissue disease ([place]) - UIP pattern, progresive Encounter for therapeutic monitoring -significant side effects with pirfenidone Nintedanib therapy: Late July 2021 through mid October 2021 [and then held following cholecystitis and upper GI bleed with duodenal ulcers]  -I am concerned interstitial lung disease is worse based on desaturation to 86%  Plan -Hold off nintedanib due to ulcer and bleeding history [it is possible that nintedanib rates the risk of this along with prednisone and emotional stress] -Supportive care at this point - start 2L [place] with exertion  - check ONO on room air - will order   Blood loss anemia from esophageal ulcers and duodenal ulcers - oct 2021  - improved hgb to > 9gm%  Plan  - follow with Dr Collene Mares  Electrolyte imblaance   - mild low potassium, magnesium and phosphorus on labs today  plan - neutraphos 1 packet per day x 2 weeks  - magnesium oxide 44m per day x 2 weeks  - kcl 131m daily x 2 weeks  Rheumatoid arthritis involving multiple joints ([place] -This appears active given the onset of pulmonary nodules and new elbows and also interstitial lung disease in the lung and active joint pain  - Plan -plaquenil oer Dr BeAmil AmenKeep your prednisone at 5 mg/day -Rest of treatment per Dr. BeAmil AmenFinancial Difficulties Disabiltuy eval  -Glad you got disability  Plan -No specific intervention   Followup  - 2-6 weeks with Dr RaGuadalupe Dawnn 30 min slot    ( Level 05 visit: Estb 40-54 min n  visit type: on-site physical face to visit  in total care time  and counseling or/and coordination of care by this undersigned MD - Dr MuBrand MalesThis includes one or more of the following on this same day 07/22/2020: pre-charting, chart review, note writing, documentation discussion of test results, diagnostic or treatment recommendations, prognosis, risks and benefits of management options, instructions, education, compliance or risk-factor reduction. It excludes time spent by the CMPrescottr office staff in the care of the patient. Actual time 5744in)    SIGNATURE    Dr. MuBrand MalesM.D., F.C.C.P,  Pulmonary and Critical Care Medicine Staff Physician, [place] Point[place] - Interstitial Lung Disease  Program  Pulmonary Fi[place]hatt[id number]: [phone] no answer or between  15:00h - 7:00h: call [phone] Telephone: [phone]  12:43 PM 07/22/2020

## 2020-07-22 NOTE — Patient Instructions (Addendum)
ICD-10-CM   1. Hemoptysis  R04.2   2. Pulmonary aspergilloma (HCC)  B44.9   3. Interstitial lung disease due to connective tissue disease (HCC)  J84.89    M35.9   4. Encounter for therapeutic drug monitoring  Z51.81   5. Physical deconditioning  R53.81   6. Encounter for disability examination  Z02.71   7. Blood loss anemia  D50.0   8. Electrolyte imbalance  E87.8      Hemoptysis - completed bronch 11/27/19 - no new culture results - mild and ongoing and due to below lung issues despite  voricanazole restart on 10/26/2019. On hold since Oct 2021  admission for GI issues - referral to IR for embolization was held up because of admission in October 2021  Plan  -Continue to monitor -If it gets worse go to the ER -We can consider rereferral to interventional radiology but at this point based on the input we will hold off till GI issues are sorted out  Pulmonary aspergilloma (HCC) Bronchiectasis with LRTI Iinfection of aspergilloma  -Currently off voriconazole since hospitalization in October 2021 for GI issues Plan  - per Dr Luciana Axe -   Interstitial lung disease due to connective tissue disease (HCC) - UIP pattern, progresive Encounter for therapeutic monitoring -significant side effects with pirfenidone Nintedanib therapy: Late July 2021 through mid October 2021 [and then held following cholecystitis and upper GI bleed with duodenal ulcers]  -I am concerned interstitial lung disease is worse based on desaturation to 86%  Plan -Hold off nintedanib due to ulcer and bleeding history [it is possible that nintedanib rates the risk of this along with prednisone and emotional stress] -Supportive care at this point - start 2L [place] with exertion  - check ONO on room air - will order   Blood loss anemia from esophageal ulcers and duodenal ulcers - oct 2021  - improved hgb to > 9gm%  Plan  - follow with Dr Loreta Ave  Electrolyte imblaance   - mild low potassium, magnesium and phosphorus  on labs today  plan - neutraphos 1 packet per day x 2 weeks  - magnesium oxide 400mg  per day x 2 weeks  - kcl daily x 2 weeks  Rheumatoid arthritis involving multiple joints (HCC)  -This appears active given the onset of pulmonary nodules and new elbows and also interstitial lung disease in the lung and active joint pain  - Plan -plaquenil oer Dr -Keep your prednisone at 5 mg/day -Rest of treatment per Dr. Dierdre Forth  Financial Difficulties Disabiltuy eval  -Glad you got disability  Plan -No specific intervention   Followup  - 2-6 weeks with Dr Dierdre Forth in 30 min slot

## 2020-07-23 ENCOUNTER — Telehealth: Payer: Self-pay | Admitting: Internal Medicine

## 2020-07-23 NOTE — Telephone Encounter (Signed)
I called and spoke with the patient and let her know that if she need to have the ONO, she can send someone to pick it up for her.  Advised that I am working with Efraim Kaufmann with [employer] to get the oxygen out to her today.  I told her I was checking with Dr. [name] to see if he still wanted the ONO done and we would let her know when we heard back from him.  She verbalized understanding.  Dr. [name], The patient currently has no oxygen in her home.  I am working on getting her oxygen out to her today.  I spoke with Melissa with [employer] and she said the ONO was not necessary to qualify her for oxygen if this is the reason the test was ordered.  With the walk test in the office, she qualifies for oxygen 24/7.  Please advise if you want to proceed with the ONO.  Thank you.

## 2020-07-23 NOTE — Telephone Encounter (Signed)
Primary Pulmonologist:   Ramasamy Last office visit and with whom: 07/23/2020  Ramaswamy What do we see them for (pulmonary problems): Hemoptysis, ILD Last OV assessment/plan:     Reason for call:  Assessment:       ICD-10-CM   1. Hemoptysis  R04.2   2. Pulmonary aspergilloma (HCC)  B44.9   3. Interstitial lung disease due to connective tissue disease (HCC)  J84.89    M35.9   4. Encounter for therapeutic drug monitoring  Z51.81   5. Physical deconditioning  R53.81   6. Encounter for disability examination  Z02.71   7. Blood loss anemia  D50.0   8. Electrolyte imbalance  E87.8        Plan:     Patient Instructions     ICD-10-CM   1. Hemoptysis  R04.2   2. Pulmonary aspergilloma (HCC)  B44.9   3. Interstitial lung disease due to connective tissue disease (HCC)  J84.89    M35.9   4. Encounter for therapeutic drug monitoring  Z51.81   5. Physical deconditioning  R53.81   6. Encounter for disability examination  Z02.71   7. Blood loss anemia  D50.0   8. Electrolyte imbalance  E87.8      Hemoptysis - completed bronch 11/27/19 - no new culture results - mild and ongoing and due to below lung issues despite  voricanazole restart on 10/26/2019. On hold since Oct 2021  admission for GI issues - referral to IR for embolization was held up because of admission in October 2021  Plan  -Continue to monitor -If it gets worse go to the ER -We can consider rereferral to interventional radiology but at this point based on the input we will hold off till GI issues are sorted out  Pulmonary aspergilloma (HCC) Bronchiectasis with LRTI Iinfection of aspergilloma  -Currently off voriconazole since hospitalization in October 2021 for GI issues Plan  - per Dr Luciana Axe -   Interstitial lung disease due to connective tissue disease (HCC) - UIP pattern, progresive Encounter for therapeutic monitoring -significant side effects with pirfenidone Nintedanib  therapy: Late July 2021 through mid October 2021 [and then held following cholecystitis and upper GI bleed with duodenal ulcers]  -I am concerned interstitial lung disease is worse based on desaturation to 86%  Plan -Hold off nintedanib due to ulcer and bleeding history [it is possible that nintedanib rates the risk of this along with prednisone and emotional stress] -Supportive care at this point - start 2L [place] with exertion  - check ONO on room air - will order   Blood loss anemia from esophageal ulcers and duodenal ulcers - oct 2021  - improved hgb to > 9gm%  Plan  - follow with Dr Loreta Ave  Electrolyte imblaance   - mild low potassium, magnesium and phosphorus on labs today  plan - neutraphos 1 packet per day x 2 weeks  - magnesium oxide  per day x 2 weeks  - kcl daily x 2 weeks  Rheumatoid arthritis involving multiple joints (HCC)  -This appears active given the onset of pulmonary nodules and new elbows and also interstitial lung disease in the lung and active joint pain  - Plan -plaquenil oer Dr Dierdre Forth -Keep your prednisone at 5 mg/day -Rest of treatment per Dr. Dierdre Forth  Financial Difficulties Disabiltuy eval  -Glad you got disability  Plan -No specific intervention   Followup  - 2-6 weeks with Dr Conni Elliot in 30 min slot    ( Level 05 visit:  Estb 40-54 min n  visit type: on-site physical face to visit  in total care time and counseling or/and coordination of care by this undersigned MD - Dr Kalman Shan. This includes one or more of the following on this same day 07/22/2020: pre-charting, chart review, note writing, documentation discussion of test results, diagnostic or treatment recommendations, prognosis, risks and benefits of management options, instructions, education, compliance or risk-factor reduction. It excludes time spent by the CMA or office staff in the care of the patient. Actual time 57 min)    SIGNATURE     Dr. Kalman Shan, M.D., F.C.C.P,  Pulmonary and Critical Care Medicine Staff Physician, [facility] Center Director - Interstitial Lung Disease  Program  Pulmonary Fibrosis [facility] at [facility] [place], [place], [address]  Pager: [phone], If no answer or between  15:00h - 7:00h: call [phone] Telephone: [phone]  12:43 PM 07/22/2020     Patient Instructions by Kalman Shan, MD at 07/22/2020 11:30 AM Author: Kalman Shan, MD Author Type: Physician Filed: 07/22/2020 12:42 PM  Note Status: Addendum Sebastian Ache: Cosign Not Required Encounter Date: 07/22/2020  Editor: Kalman Shan, MD (Physician)      Prior Versions: 1. Kalman Shan, MD (Physician) at 07/22/2020 12:41 PM - Addendum   2. Kalman Shan, MD (Physician) at 07/22/2020 12:34 PM - Addendum   3. Kalman Shan, MD (Physician) at 07/22/2020 12:14 PM - Signed        ICD-10-CM   1. Hemoptysis  R04.2   2. Pulmonary aspergilloma (HCC)  B44.9   3. Interstitial lung disease due to connective tissue disease (HCC)  J84.89    M35.9   4. Encounter for therapeutic drug monitoring  Z51.81   5. Physical deconditioning  R53.81   6. Encounter for disability examination  Z02.71   7. Blood loss anemia  D50.0   8. Electrolyte imbalance  E87.8      Hemoptysis - completed bronch 11/27/19 - no new culture results - mild and ongoing and due to below lung issues despite  voricanazole restart on 10/26/2019. On hold since Oct 2021  admission for GI issues - referral to IR for embolization was held up because of admission in October 2021  Plan  -Continue to monitor -If it gets worse go to the ER -We can consider rereferral to interventional radiology but at this point based on the input we will hold off till GI issues are sorted out  Pulmonary aspergilloma (HCC) Bronchiectasis with LRTI Iinfection of aspergilloma  -Currently off  voriconazole since hospitalization in October 2021 for GI issues Plan  - per Dr Luciana Axe -   Interstitial lung disease due to connective tissue disease (HCC) - UIP pattern, progresive Encounter for therapeutic monitoring -significant side effects with pirfenidone Nintedanib therapy: Late July 2021 through mid October 2021 [and then held following cholecystitis and upper GI bleed with duodenal ulcers]  -I am concerned interstitial lung disease is worse based on desaturation to 86%  Plan -Hold off nintedanib due to ulcer and bleeding history [it is possible that nintedanib rates the risk of this along with prednisone and emotional stress] -Supportive care at this point - start 2L [place] with exertion  - check ONO on room air - will order   Blood loss anemia from esophageal ulcers and duodenal ulcers - oct 2021  - improved hgb to > 9gm%  Plan  - follow with Dr Loreta Ave  Electrolyte imblaance   - mild  low potassium, magnesium and phosphorus on labs today  plan - neutraphos 1 packet per day x 2 weeks  - magnesium oxide 400mg  per day x 2 weeks  - kcl daily x 2 weeks  Rheumatoid arthritis involving multiple joints (HCC)  -This appears active given the onset of pulmonary nodules and new elbows and also interstitial lung disease in the lung and active joint pain  - Plan -plaquenil oer Dr -Keep your prednisone at 5 mg/day -Rest of treatment per Dr. Dierdre Forth  Financial Difficulties Disabiltuy eval  -Glad you got disability  Plan -No specific intervention   Followup  - 2-6 weeks with Dr Dierdre Forth in 30 min slot     Instructions    ICD-10-CM   1. Hemoptysis  R04.2   2. Pulmonary aspergilloma (HCC)  B44.9   3. Interstitial lung disease due to connective tissue disease (HCC)  J84.89    M35.9   4. Encounter for therapeutic drug monitoring  Z51.81   5. Physical deconditioning  R53.81   6. Encounter for disability examination  Z02.71   7.  Blood loss anemia  D50.0   8. Electrolyte imbalance  E87.8      Hemoptysis - completed bronch 11/27/19 - no new culture results - mild and ongoing and due to below lung issues despite  voricanazole restart on 10/26/2019. On hold since Oct 2021  admission for GI issues - referral to IR for embolization was held up because of admission in October 2021  Plan  -Continue to monitor -If it gets worse go to the ER -We can consider rereferral to interventional radiology but at this point based on the input we will hold off till GI issues are sorted out  Pulmonary aspergilloma (HCC) Bronchiectasis with LRTI Iinfection of aspergilloma  -Currently off voriconazole since hospitalization in October 2021 for GI issues Plan  - per Dr November 2021 -   Interstitial lung disease due to connective tissue disease (HCC) - UIP pattern, progresive Encounter for therapeutic monitoring -significant side effects with pirfenidone Nintedanib therapy: Late July 2021 through mid October 2021 [and then held following cholecystitis and upper GI bleed with duodenal ulcers]  -I am concerned interstitial lung disease is worse based on desaturation to 86%  Plan -Hold off nintedanib due to ulcer and bleeding history [it is possible that nintedanib rates the risk of this along with prednisone and emotional stress] -Supportive care at this point - start 2L [place] with exertion  - check ONO on room air - will order   Blood loss anemia from esophageal ulcers and duodenal ulcers - oct 2021  - improved hgb to > 9gm%  Plan  - follow with Dr Nov 2021  Electrolyte imblaance   - mild low potassium, magnesium and phosphorus on labs today  plan - neutraphos 1 packet per day x 2 weeks  - magnesium oxide 400mg  per day x 2 weeks  - kcl Loreta Ave daily x 2 weeks  Rheumatoid arthritis involving multiple joints (HCC)  -This appears active given the onset of pulmonary nodules and new elbows and also interstitial lung  disease in the lung and active joint pain  - Plan -plaquenil oer Dr -Keep your prednisone at 5 mg/day -Rest of treatment per Dr.  Financial Difficulties Disabiltuy eval  -Glad you got disability  Plan -No specific intervention   Followup  - 2-6 weeks with Dr Dierdre Forth in 30 min slot     Reason for call:  Called and spoke with patient, she was  in the office yesterday, 07/22/2020, walked, her sats dropped to 83%, she was placed on 2L of oxygen, but was not sent home with any oxygen.  She understood that she would be sent home with a small oxygen concentrator for a couple of weeks while her body recovers from surgery.  She has  No oxygen in her home and her sats are 80% on RA.   (examples of things to ask: : When did symptoms start? Fever? Cough? Productive? Color to sputum? More sputum than usual? Wheezing? Have you needed increased oxygen? Are you taking your respiratory medications? What over the counter measures have you tried?)  Allergies  Allergen Reactions   Bee Venom Anaphylaxis   Benadryl Allergy [Diphenhydramine Hcl]     Went into afib   Codeine Other (See Comments)    "seeing spots"   Pirfenidone Other (See Comments)    GI side effects   Penicillins Rash    Has patient had a PCN reaction causing immediate rash, facial/tongue/throat swelling, SOB or lightheadedness with hypotension: No Has patient had a PCN reaction causing severe rash involving mucus membranes or skin necrosis: No Has patient had a PCN reaction that required hospitalization No Has patient had a PCN reaction occurring within the last 10 years: Yes If all of the above answers are "NO", then may proceed with Cephalosporin use.     Immunization History  Administered Date(s) Administered   Influenza Inj Mdck Quad Pf 05/25/2019   Influenza,inj,Quad PF,6+ Mos 06/25/2015, 05/31/2017, 05/29/2018, 05/16/2020   Influenza-Unspecified 06/06/2014   PFIZER SARS-COV-2 Vaccination  11/29/2019, 12/22/2019, 07/11/2020   Pneumococcal Conjugate-13 08/11/2018   Pneumococcal Polysaccharide-23 02/18/2014   Tdap 09/01/2016

## 2020-07-25 NOTE — Telephone Encounter (Signed)
If she qualifies for 02 24/7 - then no need to test ONO

## 2020-07-25 NOTE — Telephone Encounter (Signed)
Spoke with the pt and notified no ONO is needed  She verbalized understanding and nothing further needed

## 2020-07-28 ENCOUNTER — Other Ambulatory Visit: Payer: Self-pay | Admitting: Obstetrics & Gynecology

## 2020-07-28 DIAGNOSIS — M8580 Other specified disorders of bone density and structure, unspecified site: Secondary | ICD-10-CM

## 2020-08-01 ENCOUNTER — Telehealth: Payer: Self-pay | Admitting: Internal Medicine

## 2020-08-01 NOTE — Telephone Encounter (Signed)
Look like patient should be wearing 2L on exertion, reinforce regular use. She needs to wear oxygen during any activities of daily living. I would ask Dr. Efraim Kaufmann if he would like to refer her to pulmonary rehab which could help.

## 2020-08-01 NOTE — Telephone Encounter (Signed)
Primary Pulmonologist: Ramaswamy Last office visit and with whom: 07/22/20 MR What do we see them for (pulmonary problems): ILD, hemoptysis, pulmonary aspergilloma, RA Last OV assessment/plan:  Hemoptysis - completed bronch 11/27/19 - no new culture results - mild and ongoing and due to below lung issues despite  voricanazole restart on 10/26/2019. On hold since Oct 2021  admission for GI issues - referral to IR for embolization was held up because of admission in October 2021  Plan  -Continue to monitor -If it gets worse go to the ER -We can consider rereferral to interventional radiology but at this point based on the input we will hold off till GI issues are sorted out  Pulmonary aspergilloma (HCC) Bronchiectasis with LRTI Iinfection of aspergilloma  -Currently off voriconazole since hospitalization in October 2021 for GI issues Plan  - per Dr Luciana Axe -   Interstitial lung disease due to connective tissue disease (HCC) - UIP pattern, progresive Encounter for therapeutic monitoring -significant side effects with pirfenidone Nintedanib therapy: Late July 2021 through mid October 2021 [and then held following cholecystitis and upper GI bleed with duodenal ulcers]  -I am concerned interstitial lung disease is worse based on desaturation to 86%  Plan -Hold off nintedanib due to ulcer and bleeding history [it is possible that nintedanib rates the risk of this along with prednisone and emotional stress] -Supportive care at this point - start 2L [place] with exertion  - check ONO on room air - will order   Blood loss anemia from esophageal ulcers and duodenal ulcers - oct 2021  - improved hgb to > 9gm%  Plan  - follow with Dr Loreta Ave  Electrolyte imblaance   - mild low potassium, magnesium and phosphorus on labs today  plan - neutraphos 1 packet per day x 2 weeks  - magnesium oxide 400mg  per day x 2 weeks  - kcl daily x 2 weeks  Rheumatoid arthritis involving  multiple joints (HCC)  -This appears active given the onset of pulmonary nodules and new elbows and also interstitial lung disease in the lung and active joint pain  - Plan -plaquenil oer Dr -Keep your prednisone at 5 mg/day -Rest of treatment per Dr. Dierdre Forth  Financial Difficulties Disabiltuy eval  -Glad you got disability  Plan -No specific intervention   Followup  - 2-6 weeks with Dr Dierdre Forth in 30 min slot   Was appointment offered to patient (explain)?  Patient has appt Tuesday    Reason for call: patient states when she is walking she is dropping her oxygen sats to 82-83% with 2L oxygen , She is using her albuterol once daily.  No cough, congestion, fever, but the drop in her sats is making her feel very fatigued. This has been going on for the past 3-4 days.   Patient has no daily inhaler. I explained to her she can use the albuterol every 6 hours if she needs it and that she can bump the oxygen to 3L while walking for now until we hear further recommendations.   Beth Please advise.  Allergies  Allergen Reactions  . Bee Venom Anaphylaxis  . Benadryl Allergy [Diphenhydramine Hcl]     Went into afib  . Codeine Other (See Comments)    "seeing spots"  . Pirfenidone Other (See Comments)    GI side effects  . Penicillins Rash    Has patient had a PCN reaction causing immediate rash, facial/tongue/throat swelling, SOB or lightheadedness with hypotension: No Has patient had a PCN  reaction causing severe rash involving mucus membranes or skin necrosis: No Has patient had a PCN reaction that required hospitalization No Has patient had a PCN reaction occurring within the last 10 years: Yes If all of the above answers are "NO", then may proceed with Cephalosporin use.     Immunization History  Administered Date(s) Administered  . Influenza Inj Mdck Quad Pf 05/25/2019  . Influenza,inj,Quad PF,6+ Mos 06/25/2015, 05/31/2017, 05/29/2018, 05/16/2020  .  Influenza-Unspecified 06/06/2014  . PFIZER SARS-COV-2 Vaccination 11/29/2019, 12/22/2019, 07/11/2020  . Pneumococcal Conjugate-13 08/11/2018  . Pneumococcal Polysaccharide-23 02/18/2014  . Tdap 09/01/2016

## 2020-08-01 NOTE — Telephone Encounter (Signed)
Called and spoke with pt and she is aware of BW recs.  She stated that she has turned the oxygen up to 3 liters and this has seemed to help her more.  Her sats do not drop while she is up moving around on 3 liters and she is able to have a conversation without having to stop.  MR she has appt on Tuesday with [facility], but BW suggested Pulm Rehab for this pt.  Any thoughts?  Thanks

## 2020-08-03 NOTE — Telephone Encounter (Signed)
The new o2 need is not good news Pulm rehab can make joint pain worse I will address pulm rehab +/- Right heart cath at followup I will close the message. Reply not needed  Thanks  MR

## 2020-08-04 ENCOUNTER — Telehealth: Payer: Self-pay | Admitting: Internal Medicine

## 2020-08-04 ENCOUNTER — Inpatient Hospital Stay (HOSPITAL_COMMUNITY)
Admission: EM | Admit: 2020-08-04 | Discharge: 2020-08-06 | DRG: 193 | Disposition: A | Discharge status: Home / Self Care | Payer: 59 | Attending: Internal Medicine | Admitting: Internal Medicine

## 2020-08-04 ENCOUNTER — Emergency Department (HOSPITAL_COMMUNITY): Payer: 59

## 2020-08-04 ENCOUNTER — Encounter (HOSPITAL_COMMUNITY): Payer: Self-pay

## 2020-08-04 ENCOUNTER — Other Ambulatory Visit: Payer: Self-pay

## 2020-08-04 DIAGNOSIS — K449 Diaphragmatic hernia without obstruction or gangrene: Secondary | ICD-10-CM | POA: Diagnosis present

## 2020-08-04 DIAGNOSIS — J479 Bronchiectasis, uncomplicated: Secondary | ICD-10-CM | POA: Diagnosis not present

## 2020-08-04 DIAGNOSIS — Z833 Family history of diabetes mellitus: Secondary | ICD-10-CM | POA: Diagnosis not present

## 2020-08-04 DIAGNOSIS — Z8619 Personal history of other infectious and parasitic diseases: Secondary | ICD-10-CM

## 2020-08-04 DIAGNOSIS — M359 Systemic involvement of connective tissue, unspecified: Secondary | ICD-10-CM | POA: Diagnosis not present

## 2020-08-04 DIAGNOSIS — Z885 Allergy status to narcotic agent status: Secondary | ICD-10-CM

## 2020-08-04 DIAGNOSIS — Z8261 Family history of arthritis: Secondary | ICD-10-CM

## 2020-08-04 DIAGNOSIS — Z880 Allergy status to penicillin: Secondary | ICD-10-CM | POA: Diagnosis not present

## 2020-08-04 DIAGNOSIS — I5032 Chronic diastolic (congestive) heart failure: Secondary | ICD-10-CM | POA: Diagnosis present

## 2020-08-04 DIAGNOSIS — J440 Chronic obstructive pulmonary disease with acute lower respiratory infection: Secondary | ICD-10-CM | POA: Diagnosis present

## 2020-08-04 DIAGNOSIS — J849 Interstitial pulmonary disease, unspecified: Secondary | ICD-10-CM | POA: Diagnosis not present

## 2020-08-04 DIAGNOSIS — J9601 Acute respiratory failure with hypoxia: Secondary | ICD-10-CM | POA: Diagnosis present

## 2020-08-04 DIAGNOSIS — K208 Other esophagitis without bleeding: Secondary | ICD-10-CM | POA: Diagnosis not present

## 2020-08-04 DIAGNOSIS — R000 Tachycardia, unspecified: Secondary | ICD-10-CM | POA: Diagnosis present

## 2020-08-04 DIAGNOSIS — D84821 Immunodeficiency due to drugs: Secondary | ICD-10-CM | POA: Diagnosis not present

## 2020-08-04 DIAGNOSIS — Z7952 Long term (current) use of systemic steroids: Secondary | ICD-10-CM

## 2020-08-04 DIAGNOSIS — Z825 Family history of asthma and other chronic lower respiratory diseases: Secondary | ICD-10-CM

## 2020-08-04 DIAGNOSIS — Z66 Do not resuscitate: Secondary | ICD-10-CM | POA: Diagnosis present

## 2020-08-04 DIAGNOSIS — J9621 Acute and chronic respiratory failure with hypoxia: Secondary | ICD-10-CM | POA: Diagnosis present

## 2020-08-04 DIAGNOSIS — J439 Emphysema, unspecified: Secondary | ICD-10-CM

## 2020-08-04 DIAGNOSIS — M0510 Rheumatoid lung disease with rheumatoid arthritis of unspecified site: Secondary | ICD-10-CM | POA: Diagnosis present

## 2020-08-04 DIAGNOSIS — J449 Chronic obstructive pulmonary disease, unspecified: Secondary | ICD-10-CM

## 2020-08-04 DIAGNOSIS — J8489 Other specified interstitial pulmonary diseases: Secondary | ICD-10-CM | POA: Diagnosis not present

## 2020-08-04 DIAGNOSIS — K259 Gastric ulcer, unspecified as acute or chronic, without hemorrhage or perforation: Secondary | ICD-10-CM | POA: Diagnosis present

## 2020-08-04 DIAGNOSIS — D75839 Thrombocytosis, unspecified: Secondary | ICD-10-CM | POA: Diagnosis present

## 2020-08-04 DIAGNOSIS — Z86001 Personal history of in-situ neoplasm of cervix uteri: Secondary | ICD-10-CM | POA: Diagnosis not present

## 2020-08-04 DIAGNOSIS — M069 Rheumatoid arthritis, unspecified: Secondary | ICD-10-CM

## 2020-08-04 DIAGNOSIS — M05732 Rheumatoid arthritis with rheumatoid factor of left wrist without organ or systems involvement: Secondary | ICD-10-CM | POA: Diagnosis not present

## 2020-08-04 DIAGNOSIS — M05731 Rheumatoid arthritis with rheumatoid factor of right wrist without organ or systems involvement: Secondary | ICD-10-CM | POA: Diagnosis not present

## 2020-08-04 DIAGNOSIS — B479 Mycetoma, unspecified: Secondary | ICD-10-CM

## 2020-08-04 DIAGNOSIS — J441 Chronic obstructive pulmonary disease with (acute) exacerbation: Secondary | ICD-10-CM | POA: Diagnosis present

## 2020-08-04 DIAGNOSIS — D649 Anemia, unspecified: Secondary | ICD-10-CM | POA: Diagnosis not present

## 2020-08-04 DIAGNOSIS — Z888 Allergy status to other drugs, medicaments and biological substances status: Secondary | ICD-10-CM | POA: Diagnosis not present

## 2020-08-04 DIAGNOSIS — F32A Depression, unspecified: Secondary | ICD-10-CM

## 2020-08-04 DIAGNOSIS — J189 Pneumonia, unspecified organism: Principal | ICD-10-CM | POA: Diagnosis present

## 2020-08-04 DIAGNOSIS — Z803 Family history of malignant neoplasm of breast: Secondary | ICD-10-CM

## 2020-08-04 DIAGNOSIS — J84112 Idiopathic pulmonary fibrosis: Secondary | ICD-10-CM | POA: Diagnosis present

## 2020-08-04 DIAGNOSIS — Z20822 Contact with and (suspected) exposure to covid-19: Secondary | ICD-10-CM | POA: Diagnosis present

## 2020-08-04 DIAGNOSIS — K209 Esophagitis, unspecified without bleeding: Secondary | ICD-10-CM | POA: Diagnosis present

## 2020-08-04 DIAGNOSIS — B449 Aspergillosis, unspecified: Secondary | ICD-10-CM

## 2020-08-04 DIAGNOSIS — J410 Simple chronic bronchitis: Secondary | ICD-10-CM | POA: Diagnosis not present

## 2020-08-04 DIAGNOSIS — K269 Duodenal ulcer, unspecified as acute or chronic, without hemorrhage or perforation: Secondary | ICD-10-CM | POA: Diagnosis present

## 2020-08-04 DIAGNOSIS — T380X5A Adverse effect of glucocorticoids and synthetic analogues, initial encounter: Secondary | ICD-10-CM | POA: Diagnosis not present

## 2020-08-04 DIAGNOSIS — Z91030 Bee allergy status: Secondary | ICD-10-CM | POA: Diagnosis not present

## 2020-08-04 DIAGNOSIS — B441 Other pulmonary aspergillosis: Secondary | ICD-10-CM | POA: Diagnosis present

## 2020-08-04 DIAGNOSIS — Z87891 Personal history of nicotine dependence: Secondary | ICD-10-CM

## 2020-08-04 DIAGNOSIS — Z7189 Other specified counseling: Secondary | ICD-10-CM | POA: Diagnosis not present

## 2020-08-04 DIAGNOSIS — Z79899 Other long term (current) drug therapy: Secondary | ICD-10-CM

## 2020-08-04 HISTORY — DX: Chronic obstructive pulmonary disease, unspecified: J44.9

## 2020-08-04 HISTORY — DX: Interstitial pulmonary disease, unspecified: J84.9

## 2020-08-04 LAB — BASIC METABOLIC PANEL
Anion gap: 13 (ref 5–15)
BUN: 10 mg/dL (ref 6–20)
CO2: 26 mmol/L (ref 22–32)
Calcium: 8.9 mg/dL (ref 8.9–10.3)
Chloride: 94 mmol/L — ABNORMAL LOW (ref 98–111)
Creatinine, Ser: 0.56 mg/dL (ref 0.44–1.00)
GFR, Estimated: >60 mL/min (ref >60)
Glucose, Bld: 106 mg/dL — ABNORMAL HIGH (ref 70–99)
Potassium: 4.5 mmol/L (ref 3.5–5.1)
Sodium: 133 mmol/L — ABNORMAL LOW (ref 135–145)

## 2020-08-04 LAB — BRAIN NATRIURETIC PEPTIDE: B Natriuretic Peptide: 89.6 pg/mL (ref 0.0–100.0)

## 2020-08-04 LAB — RESP PANEL BY RT-PCR (FLU A&B, COVID) ARPGX2
Influenza A by PCR: NEGATIVE
Influenza B by PCR: NEGATIVE
SARS Coronavirus 2 by RT PCR: NEGATIVE

## 2020-08-04 LAB — CBC
HCT: 35.7 % — ABNORMAL LOW (ref 36.0–46.0)
Hemoglobin: 10.7 g/dL — ABNORMAL LOW (ref 12.0–15.0)
MCH: 24.3 pg — ABNORMAL LOW (ref 26.0–34.0)
MCHC: 30 g/dL (ref 30.0–36.0)
MCV: 81 fL (ref 80.0–100.0)
Platelets: 589 10*3/uL — ABNORMAL HIGH (ref 150–400)
RBC: 4.41 MIL/uL (ref 3.87–5.11)
RDW: 17.2 % — ABNORMAL HIGH (ref 11.5–15.5)
WBC: 16.7 10*3/uL — ABNORMAL HIGH (ref 4.0–10.5)
nRBC: 0 % (ref 0.0–0.2)

## 2020-08-04 LAB — D-DIMER, QUANTITATIVE: D-Dimer, Quant: 2.19 ug/mL-FEU — ABNORMAL HIGH (ref 0.00–0.50)

## 2020-08-04 MED ORDER — POLYETHYLENE GLYCOL 3350 17 G PO PACK: 17.0000 g | PACK | Freq: Every day | ORAL | Status: DC | PRN

## 2020-08-04 MED ORDER — SODIUM CHLORIDE (PF) 0.9 % IJ SOLN
INTRAMUSCULAR | Status: AC
  Filled 2020-08-04: qty 50

## 2020-08-04 MED ORDER — ONDANSETRON HCL 4 MG PO TABS: 4.0000 mg | ORAL_TABLET | Freq: Four times a day (QID) | ORAL | Status: DC | PRN

## 2020-08-04 MED ORDER — IPRATROPIUM-ALBUTEROL 0.5-2.5 (3) MG/3ML IN SOLN
3.0000 mL | Freq: Three times a day (TID) | RESPIRATORY_TRACT | Status: DC
  Administered 2020-08-05 – 2020-08-06 (×5): 3 mL via RESPIRATORY_TRACT
  Filled 2020-08-04 (×5): qty 3

## 2020-08-04 MED ORDER — ALBUTEROL SULFATE (2.5 MG/3ML) 0.083% IN NEBU: 2.5000 mg | INHALATION_SOLUTION | RESPIRATORY_TRACT | Status: DC | PRN

## 2020-08-04 MED ORDER — LEVOFLOXACIN IN D5W 750 MG/150ML IV SOLN
750.0000 mg | Freq: Once | INTRAVENOUS | Status: AC
  Administered 2020-08-04: 750 mg via INTRAVENOUS
  Filled 2020-08-04: qty 150

## 2020-08-04 MED ORDER — ACETAMINOPHEN 650 MG RE SUPP: 650.0000 mg | Freq: Four times a day (QID) | RECTAL | Status: DC | PRN

## 2020-08-04 MED ORDER — ENOXAPARIN SODIUM 40 MG/0.4ML ~~LOC~~ SOLN
40.0000 mg | SUBCUTANEOUS | Status: DC
  Administered 2020-08-04 – 2020-08-05 (×2): 40 mg via SUBCUTANEOUS
  Filled 2020-08-04 (×2): qty 0.4

## 2020-08-04 MED ORDER — HYDROMORPHONE HCL 1 MG/ML PO LIQD
1.0000 mg | Freq: Once | ORAL | Status: DC
  Filled 2020-08-04: qty 1

## 2020-08-04 MED ORDER — ACETAMINOPHEN 325 MG PO TABS
650.0000 mg | ORAL_TABLET | Freq: Four times a day (QID) | ORAL | Status: DC | PRN
  Administered 2020-08-05: 650 mg via ORAL
  Filled 2020-08-04: qty 2

## 2020-08-04 MED ORDER — PANTOPRAZOLE SODIUM 40 MG PO TBEC
40.0000 mg | DELAYED_RELEASE_TABLET | Freq: Two times a day (BID) | ORAL | Status: DC
  Administered 2020-08-04 – 2020-08-06 (×4): 40 mg via ORAL
  Filled 2020-08-04 (×4): qty 1

## 2020-08-04 MED ORDER — ESCITALOPRAM OXALATE 10 MG PO TABS
10.0000 mg | ORAL_TABLET | Freq: Every day | ORAL | Status: DC
  Administered 2020-08-05 – 2020-08-06 (×2): 10 mg via ORAL
  Filled 2020-08-04 (×2): qty 1

## 2020-08-04 MED ORDER — PREDNISONE 20 MG PO TABS
40.0000 mg | ORAL_TABLET | Freq: Every day | ORAL | Status: DC
  Administered 2020-08-06: 40 mg via ORAL
  Filled 2020-08-04: qty 2

## 2020-08-04 MED ORDER — IOHEXOL 350 MG/ML SOLN
100.0000 mL | Freq: Once | INTRAVENOUS | Status: AC | PRN
  Administered 2020-08-04: 64 mL via INTRAVENOUS

## 2020-08-04 MED ORDER — METOPROLOL TARTRATE 5 MG/5ML IV SOLN: 5.0000 mg | Freq: Four times a day (QID) | INTRAVENOUS | Status: DC | PRN

## 2020-08-04 MED ORDER — HYDROXYCHLOROQUINE SULFATE 200 MG PO TABS
200.0000 mg | ORAL_TABLET | Freq: Two times a day (BID) | ORAL | Status: DC
  Administered 2020-08-04 – 2020-08-06 (×4): 200 mg via ORAL
  Filled 2020-08-04 (×4): qty 1

## 2020-08-04 MED ORDER — METHYLPREDNISOLONE SODIUM SUCC 40 MG IJ SOLR
40.0000 mg | Freq: Four times a day (QID) | INTRAMUSCULAR | Status: AC
  Administered 2020-08-04 – 2020-08-05 (×4): 40 mg via INTRAVENOUS
  Filled 2020-08-04 (×4): qty 1

## 2020-08-04 MED ORDER — LEVOFLOXACIN IN D5W 750 MG/150ML IV SOLN
750.0000 mg | INTRAVENOUS | Status: DC
  Administered 2020-08-05: 750 mg via INTRAVENOUS
  Filled 2020-08-04 (×2): qty 150

## 2020-08-04 MED ORDER — IPRATROPIUM-ALBUTEROL 0.5-2.5 (3) MG/3ML IN SOLN
3.0000 mL | Freq: Four times a day (QID) | RESPIRATORY_TRACT | Status: DC
  Administered 2020-08-04: 3 mL via RESPIRATORY_TRACT
  Filled 2020-08-04: qty 3

## 2020-08-04 MED ORDER — ONDANSETRON HCL 4 MG/2ML IJ SOLN: 4.0000 mg | Freq: Four times a day (QID) | INTRAMUSCULAR | Status: DC | PRN

## 2020-08-04 NOTE — H&P (Signed)
History and Physical        Hospital Admission Note Date: 08/04/2020  Patient name: Kimberly Shelton Medical record number: [id number] Date of birth: May 16, 1961 Age: 59 y.o. Gender: female  PCP: Kristian Covey, MD   Chief Complaint    Chief Complaint  Patient presents with  . Shortness of Breath      HPI:   This is a 59 year old female who has been vaccinated against Covid with past medical history of COPD and bronchiectasis, CHF, ILD and follows with Dr. [name], RA on low-dose steroids, pulmonary aspergilloma off voriconazole since October 2021 for GI issues, recent lap chole in October, history of streptococcal pneumonia with cavitation, tobacco use who presented to the ED with worsening shortness of breath for the past 4 days.  States that ever since her recent cholecystectomy at the end of October she has been having more shortness of breath.  This has been worsening and acutely worsened towards the end of last week and was placed on oxygen by her pulmonologist starting on Friday at 2 LPM but this has been increasing throughout the weekend and required 4 LPM today.  Has not had any improvement with her inhalers at home.  Checked her O2 sat at home and noted SPO2 in the low 80s which prompted her to come to the ED.  Admits to minimally productive cough with clear sputum.  Denies any fevers, sick contacts or any other issues.  Of note she also recently got her booster shot last week   ED Course: Afebrile, tachycardic, tachypneic,  hemodynamically stable, on 4 L/min. Notable Labs: Sodium 133, K4.5, chloride 94, glucose 106, BNP 89, WBC 16.7, Hb 10.7, platelets 589, D-dimer 2.19, flu and COVID-19 negative. Notable Imaging: CXR with increased bilateral patchy airspace opacities concerning for multifocal pneumonia.  CTA chest without PE but new diffuse severe groundglass opacities  in lower lobes concerning for pulmonary edema versus atypical infection and progressive subpleural cystic change in the right lower lobe consistent with progressive interstitial lung disease and stable severe cavitary change in the upper lobes with central nodularity.  She received Levaquin   Vitals:   08/04/20 1600 08/04/20 1709  BP: (!) 110/59 117/61  Pulse: (!) 103 100  Resp: (!) 29 (!) 24  Temp:  98.3 F (36.8 C)  SpO2: 93% 97%     Review of Systems:  Review of Systems  All other systems reviewed and are negative.   Medical/Social/Family History   Past Medical History: Past Medical History:  Diagnosis Date  . Abnormal Pap smear 09/2006   HGSIL CIN 2/VAIN/ CIN 3/VAIN-3 CIS  . CHF (congestive heart failure) (HCC)   . COPD (chronic obstructive pulmonary disease) (HCC)   . Foot fracture, left 10/16   hair-line fracture  . History of pneumonia 02/09/2016  . Interstitial lung disease (HCC)   . Pneumonia    was hospitalized  . RA (rheumatoid arthritis) (HCC) 07/2009    Past Surgical History:  Procedure Laterality Date  . BIOPSY  06/15/2020   Procedure: BIOPSY;  Surgeon: Benancio Deeds, MD;  Location: [facility];  Service: Gastroenterology;;  . BRONCHIAL WASHINGS  11/27/2019   Procedure: BRONCHIAL WASHINGS;  Surgeon: Kalman Shan, MD;  Location: [facility];  Service: Cardiopulmonary;;  . CERVICAL BIOPSY  W/ LOOP ELECTRODE EXCISION  2008   CIN 3  . CERVICAL CONIZATION W/BX N/A 12/15/2015   Procedure: CONIZATION CERVIX WITH BIOPSY ;  Surgeon: Jerene Bears, MD;  Location: [facility];  Service: Gynecology;  Laterality: N/A;  POSSIBLE COLD KNIFE CONE.  Please have the microscope in the room  . CHOLECYSTECTOMY N/A 06/10/2020   Procedure: LAPAROSCOPIC CHOLECYSTECTOMY;  Surgeon: Griselda Miner, MD;  Location: [facility];  Service: General;  Laterality: N/A;  . COLONOSCOPY    . ESOPHAGOGASTRODUODENOSCOPY (EGD) WITH PROPOFOL N/A 06/15/2020   Procedure:  ESOPHAGOGASTRODUODENOSCOPY (EGD) WITH PROPOFOL;  Surgeon: Benancio Deeds, MD;  Location: [facility];  Service: Gastroenterology;  Laterality: N/A;  . IR RADIOLOGIST EVAL & MGMT  05/27/2020  . LEEP N/A 12/15/2015   Procedure: LOOP ELECTROSURGICAL EXCISION PROCEDURE (LEEP) with colpo;  Surgeon: Jerene Bears, MD;  Location: [facility];  Service: Gynecology;  Laterality: N/A;  . PILONIDAL CYST EXCISION  1999   I&D  . VIDEO BRONCHOSCOPY N/A 11/27/2019   Procedure: VIDEO BRONCHOSCOPY WITHOUT FLUORO;  Surgeon: Kalman Shan, MD;  Location: [facility];  Service: Cardiopulmonary;  Laterality: N/A;  . WISDOM TOOTH EXTRACTION      Medications: Prior to Admission medications   Medication Sig Start Date End Date Taking? Authorizing Provider  acetaminophen (TYLENOL) 325 MG tablet Take 2 tablets (650 mg total) by mouth every 6 (six) hours as needed for mild pain (or Fever >/= 101). 06/12/20  Yes Trixie Deis R, PA-C  albuterol (VENTOLIN HFA) 108 (90 Base) MCG/ACT inhaler INHALE 2 PUFFS INTO THE LUNGS EVERY 6 HOURS AS NEEDED FOR WHEEZING OR SHORTNESS OF BREATH Patient taking differently: Inhale 2 puffs into the lungs every 6 (six) hours as needed for wheezing or shortness of breath.  11/07/19  Yes Kalman Shan, MD  benzonatate (TESSALON) 200 MG capsule Take 200 mg by mouth in the morning, at noon, and at bedtime.   Yes [provider]  docusate sodium (COLACE) 100 MG capsule Take 1 capsule (100 mg total) by mouth daily as needed. Patient taking differently: Take 100 mg by mouth daily as needed for mild constipation.  06/16/20 06/16/21 Yes Kc, Dayna Barker, MD  EPINEPHrine (EPIPEN 2-PAK) 0.3 mg/0.3 mL IJ SOAJ injection Inject 0.3 mg into the muscle as needed for anaphylaxis.    Yes [provider]  escitalopram (LEXAPRO) 10 MG tablet TAKE 1 TABLET BY MOUTH EVERY DAY Patient taking differently: Take 10 mg by mouth daily.  05/13/20  Yes Jerene Bears, MD  hydroxychloroquine (PLAQUENIL) 200  MG tablet Take 200 mg by mouth 2 (two) times daily. 10/17/19  Yes [provider]  MAGNESIUM-OXIDE 400 (241.3 Mg) MG tablet Take 400 mg by mouth daily. 07/22/20  Yes [provider]  pantoprazole (PROTONIX) 40 MG tablet Take 1 tablet (40 mg total) by mouth 2 (two) times daily. 06/16/20 08/15/20 Yes Lanae Boast, MD  potassium chloride (KLOR-CON) 10 MEQ tablet Take 1 tablet (10 mEq total) by mouth daily for 14 days. 07/22/20 08/05/20 Yes Kalman Shan, MD  predniSONE (DELTASONE) 5 MG tablet Take 0.5 tablets (2.5 mg total) by mouth daily with breakfast. Start on 06/15/20 Patient taking differently: Take 2.5 mg by mouth daily with breakfast.  06/16/20  Yes Kc, Ramesh, MD  sucralfate (CARAFATE) 1 g tablet Take 1 g by mouth 4 (four) times daily. 07/30/20  Yes [provider]  diltiazem (CARDIZEM CD) 120 MG 24 hr capsule Take  1 capsule (120 mg total) by mouth as needed. Hold for sbp <100 or hr < 60/min Patient not taking: Reported on 08/04/2020 06/25/20 08/24/20  Dyann Kief, PA-C  dimethicone (NEUTRAPHOR) 1 % cream Apply 1 application topically 2 (two) times daily as needed for up to 14 days for dry skin. Patient not taking: Reported on 08/04/2020 07/22/20 08/05/20  Kalman Shan, MD  ferrous sulfate 325 (65 FE) MG EC tablet Take 1 tablet (325 mg total) by mouth 2 (two) times daily. 06/16/20 08/04/20  Lanae Boast, MD  Magnesium Oxide 400 MG CAPS Take 1 capsule (400 mg total) by mouth daily for 14 days. Patient not taking: Reported on 08/04/2020 07/22/20 08/05/20  Kalman Shan, MD  ondansetron (ZOFRAN) 4 MG tablet Take 1 tablet (4 mg total) by mouth 3 (three) times daily as needed for nausea. Patient not taking: Reported on 08/04/2020 01/10/20   Kalman Shan, MD  oxyCODONE (OXY IR/ROXICODONE) 5 MG immediate release tablet Take 1 tablet (5 mg total) by mouth every 4 (four) hours as needed for moderate pain. Patient not taking: Reported on 08/04/2020 06/12/20    Juliet Rude, PA-C  sucralfate (CARAFATE) 1 GM/10ML suspension Take 10 mLs (1 g total) by mouth 4 (four) times daily -  with meals and at bedtime. Patient not taking: Reported on 08/04/2020 06/16/20   Lanae Boast, MD    Allergies:   Allergies  Allergen Reactions  . Bee Venom Anaphylaxis  . Benadryl Allergy [Diphenhydramine Hcl]     Went into afib  . Codeine Other (See Comments)    "seeing spots"  . Pirfenidone Other (See Comments)    GI side effects  . Penicillins Rash    Has patient had a PCN reaction causing immediate rash, facial/tongue/throat swelling, SOB or lightheadedness with hypotension: No Has patient had a PCN reaction causing severe rash involving mucus membranes or skin necrosis: No Has patient had a PCN reaction that required hospitalization No Has patient had a PCN reaction occurring within the last 10 years: Yes If all of the above answers are "NO", then may proceed with Cephalosporin use.     Social History:  reports that she quit smoking about 6 years ago. Her smoking use included cigarettes and e-cigarettes. She started smoking about 36 years ago. She has a 30.00 pack-year smoking history. She has never used smokeless tobacco. She reports that she does not drink alcohol and does not use drugs.  Family History: Family History  Problem Relation Age of Onset  . Diabetes Mother   . Rheum arthritis Mother   . Diabetes Father   . COPD Father   . Bronchitis Father   . Cancer Brother        Lung  . Breast cancer Maternal Aunt 74  . CAD Neg Hx      Objective   Physical Exam: Blood pressure 117/61, pulse 100, temperature 98.3 F (36.8 C), temperature source Oral, resp. rate (!) 24, height 5\' 3"  (1.6 m), weight 62.8 kg, last menstrual period 09/06/2004, SpO2 97 %.  Physical Exam Vitals and nursing note reviewed.  Constitutional:      General: She is not in acute distress.    Appearance: Normal appearance.  HENT:     Head: Normocephalic and atraumatic.      Mouth/Throat:     Mouth: Mucous membranes are moist.  Eyes:     Conjunctiva/sclera: Conjunctivae normal.  Cardiovascular:     Rate and Rhythm: Regular rhythm. Tachycardia present.  Pulmonary:  Effort: Tachypnea present. No accessory muscle usage.     Comments: Diffuse end inspiratory wheezes O2 dropped to high 80s with conversation during exam despite 4 LPM and improved to low 90s Abdominal:     General: Abdomen is flat.     Palpations: Abdomen is soft.  Musculoskeletal:        General: No swelling or tenderness.     Right lower leg: No tenderness. No edema.     Left lower leg: No tenderness. No edema.  Skin:    Coloration: Skin is not jaundiced or pale.  Neurological:     Mental Status: She is alert. Mental status is at baseline.  Psychiatric:        Mood and Affect: Mood normal.        Behavior: Behavior normal.     LABS on Admission: I have personally reviewed all the labs and imaging below    Basic Metabolic Panel: Recent Labs  Lab 08/04/20 1227  NA 133*  K 4.5  CL 94*  CO2 26  GLUCOSE 106*  BUN 10  CREATININE 0.56  CALCIUM 8.9   Liver Function Tests: No results for input(s): AST, ALT, ALKPHOS, BILITOT, PROT, ALBUMIN in the last 168 hours. No results for input(s): LIPASE, AMYLASE in the last 168 hours. No results for input(s): AMMONIA in the last 168 hours. CBC: Recent Labs  Lab 08/04/20 1227  WBC 16.7*  HGB 10.7*  HCT 35.7*  MCV 81.0  PLT 589*   Cardiac Enzymes: No results for input(s): CKTOTAL, CKMB, CKMBINDEX, TROPONINI in the last 168 hours. BNP: Invalid input(s): POCBNP CBG: No results for input(s): GLUCAP in the last 168 hours.  Radiological Exams on Admission:  CT Angio Chest PE W and/or Wo Contrast  Result Date: 08/04/2020 CLINICAL DATA:  Pulmonary embolism suspected.  Difficulty breathing. EXAM: CT ANGIOGRAPHY CHEST WITH CONTRAST TECHNIQUE: Multidetector CT imaging of the chest was performed using the standard protocol during  bolus administration of intravenous contrast. Multiplanar CT image reconstructions and MIPs were obtained to evaluate the vascular anatomy. CONTRAST:  64mL OMNIPAQUE IOHEXOL 350 MG/ML SOLN COMPARISON:  CT 09/26/2019. FINDINGS: Cardiovascular: No convincing evidence of significant pulmonary embolism. One rounded filling defect within the RIGHT upper lobe pulmonary artery proximally (image 96/5) is nonocclusive. Differential would include a tiny acute pulmonary embolism versus sequelae of remote pulmonary emboli. Mediastinum/Nodes: No axillary or supraclavicular adenopathy. There is RIGHT paratracheal and hilar lymphadenopathy with RIGHT hilar lymph node measuring up to 16 mm short axis. Lungs/Pleura: Extensive cavitary lesions in the upper lobes with central nodularity not changed from comparison exam. There is diffuse new ground-glass opacities in the lower lobes. The RIGHT lobe is more involved than the LEFT. No pleural fluid. Again demonstrated subpleural cystic change in the lower lobes. Subpleural cysts in the RIGHT lower lobe measures 14 mm in greatest dimension (image 115/6) increased from 10 mm on comparison exam. Upper Abdomen: Limited view of the liver, kidneys, pancreas are unremarkable. Normal adrenal glands. Musculoskeletal: Review of the MIP images confirms the above findings. IMPRESSION: 1. No evidence of significant pulmonary embolism. 2. New diffuse severe ground-glass opacities in lower lobes. Differential includes pulmonary edema versus atypical infection including COVID pneumonia. 3. Progressive subpleural cystic change in the RIGHT lower lobe consistent with progressive interstitial lung disease (UIP). 4. Stable severe a cavitary change in the upper lobes with central nodularity. Electronically Signed   By: Genevive Bi M.D.   On: 08/04/2020 15:08   [facility] [address]  Result Date: 08/04/2020 CLINICAL DATA:  Shortness of breath. EXAM: PORTABLE CHEST 1 VIEW COMPARISON:  June 13, 2020.  FINDINGS: The heart size and mediastinal contours are within normal limits. Increased bilateral patchy airspace opacities are noted concerning for multifocal pneumonia. No pneumothorax or pleural effusion is noted. The visualized skeletal structures are unremarkable. IMPRESSION: Increased bilateral patchy airspace opacities are noted concerning for multifocal pneumonia. Electronically Signed   By: Lupita Raider M.D.   On: 08/04/2020 13:32      EKG: Independently reviewed   A & P   Principal Problem:   Acute respiratory failure with hypoxia (HCC) Active Problems:   Rheumatoid arthritis involving multiple joints (HCC)   Former smoker   Emphysema of lung (HCC)   Bronchiectasis (HCC)   COPD with acute exacerbation (HCC)   1. Acute hypoxic respiratory failure concerning for COPD exacerbation and possibly multifocal pneumonia in the setting of complex pulmonary history (COPD, bronchiectasis, ILD, UIP, aspergilloma and strep pneumo pneumonia in the past with cavitary lesion) a. O2 dropped to high 80s with conversation during exam despite 4 LPM and improved to low 90s b. CT scan with multifocal pneumonia c. Afebrile and with chronically elevated leukocytosis.  I wonder if this is viral? d. Given her underlying pulmonary history and chronic steroids, will continue with Levaquin for now e. We will start on steroids and nebulizer treatments f. Pulmonary consulted, discussed with Dr. Celine Mans, appreciate recommendations  2. Thrombocytosis a. Likely reactive  3. RA a. On low-dose steroids and Plaquenil b. Starting high-dose steroids for acute illness c. Continue Plaquenil  4. Chronic diastolic heart failure a. BNP 80s, unlikely exacerbation    DVT prophylaxis: lovenox   Code Status: DNR  Diet: heart healthy Family Communication: Admission, patients condition and plan of care including tests being ordered have been discussed with the patient who indicates understanding and agrees with  the plan and Code Status.  Disposition Plan: The appropriate patient status for this patient is INPATIENT. Inpatient status is judged to be reasonable and necessary in order to provide the required intensity of service to ensure the patient's safety. The patient's presenting symptoms, physical exam findings, and initial radiographic and laboratory data in the context of their chronic comorbidities is felt to place them at high risk for further clinical deterioration. Furthermore, it is not anticipated that the patient will be medically stable for discharge from the hospital within 2 midnights of admission. The following factors support the patient status of inpatient.   " The patient's presenting symptoms include shortness of breath and hypoxia at home. " The worrisome physical exam findings include and inspiratory wheezing and desaturations on conversation. " The initial radiographic and laboratory data are worrisome because of multifocal pneumonia on imaging. " The chronic co-morbidities include lung disease as above, RA, on chronic steroids.   * I certify that at the point of admission it is my clinical judgment that the patient will require inpatient hospital care spanning beyond 2 midnights from the point of admission due to high intensity of service, high risk for further deterioration and high frequency of surveillance required.*     The medical decision making on this patient was of high complexity and the patient is at high risk for clinical deterioration, therefore this is a level 3  admission.  Consultants  . Pulmonary  Procedures  . None  Time Spent on Admission: 72 minutes    Jae Dire, DO [facility]  08/04/2020, 6:24 PM

## 2020-08-04 NOTE — Telephone Encounter (Signed)
Called and spoke with pt letting her know the info stated by MR. Pt verbalized understanding and stated that when she gets up to move, her O2 sats will begin to drop in the low 80s.pt stated she was feeling very SOB and also due to O2 sats dropping like they were, she stated that she was going to go to the ED to be evaluated and stated she would probably call EMS to have them take her to the ED.  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

## 2020-08-04 NOTE — ED Notes (Signed)
Pt placed on purewick at 60 mmHg. 

## 2020-08-04 NOTE — ED Provider Notes (Signed)
[facility]-EMERGENCY DEPT Provider Note   CSN: [id number] Arrival date & time: 08/04/20  1111     History Chief Complaint  Patient presents with  . Shortness of Breath    Kimberly Shelton is a 59 y.o. female.  HPI   Patient presented to the ED for evaluation of shortness of breath.  Patient states she has a history of chronic lung disease including COPD, pneumonia, and bronchiectasis.  Patient was recently in the hospital for laparoscopic cholecystectomy.  Patient states she has been having some issues with her breathing since that procedure.  She was last seen by her pulmonologist Dr. [name] on member 16th.  Patient has noticed gradual progression of her shortness of breath.  She has been started on home oxygen but she continues to be short of breath despite the oxygen.  Patient states she feels okay at rest but when she walks her oxygen saturation is dropping.  She denies any recent fevers.  Patient has been fully vaccinated for Covid.  Past Medical History:  Diagnosis Date  . Abnormal Pap smear 09/2006   HGSIL CIN 2/VAIN/ CIN 3/VAIN-3 CIS  . CHF (congestive heart failure) (HCC)   . COPD (chronic obstructive pulmonary disease) (HCC)   . Foot fracture, left 10/16   hair-line fracture  . History of pneumonia 02/09/2016  . Pneumonia    was hospitalized  . RA (rheumatoid arthritis) (HCC) 07/2009    Patient Active Problem List   Diagnosis Date Noted  . Upper GI bleed   . Atrial fibrillation (HCC)   . Acute calculous cholecystitis 06/09/2020  . Nausea & vomiting 06/09/2020  . Hiatal hernia 06/09/2020  . Immunosuppression due to drug therapy (HCC) 06/09/2020  . Conductive hearing loss of both ears 06/09/2020  . Emphysema of lung (HCC) 06/09/2020  . Interstitial lung disease due to connective tissue disease (HCC) 06/09/2020  . Coronary artery calcification of native artery 06/09/2020  . Bronchiectasis (HCC) 06/09/2020  . Cholecystitis 06/09/2020  .  Constipation 08/13/2019  . Chronic Hemoptysis from cavitary Aspergillosis 08/01/2019  . Medication monitoring encounter 03/20/2019  . Aspergillosis (HCC) 03/14/2019  . Former smoker 03/13/2019  . Referred otalgia of both ears 03/13/2019  . Cavitary lung disease 02/27/2018  . Aortic atherosclerosis (HCC) 12/29/2017  . Atrial tachycardia (HCC) 12/29/2017  . Atrial septal aneurysm 12/28/2017  . History of pneumonia 02/09/2016  . History of smoking 02/09/2016  . CIN III (cervical intraepithelial neoplasia III) 09/21/2015  . Rheumatoid arthritis involving multiple joints (HCC) 08/09/2014  . Chronic diastolic heart failure (HCC) 02/19/2014  . DEPRESSION 03/26/2009  . CARPAL TUNNEL SYNDROME 03/26/2009  . ARTHRALGIA 03/26/2009    Past Surgical History:  Procedure Laterality Date  . BIOPSY  06/15/2020   Procedure: BIOPSY;  Surgeon: Benancio Deeds, MD;  Location: [facility];  Service: Gastroenterology;;  . BRONCHIAL WASHINGS  11/27/2019   Procedure: BRONCHIAL WASHINGS;  Surgeon: Kalman Shan, MD;  Location: [facility];  Service: Cardiopulmonary;;  . CERVICAL BIOPSY  W/ LOOP ELECTRODE EXCISION  2008   CIN 3  . CERVICAL CONIZATION W/BX N/A 12/15/2015   Procedure: CONIZATION CERVIX WITH BIOPSY ;  Surgeon: Jerene Bears, MD;  Location: [facility];  Service: Gynecology;  Laterality: N/A;  POSSIBLE COLD KNIFE CONE.  Please have the microscope in the room  . CHOLECYSTECTOMY N/A 06/10/2020   Procedure: LAPAROSCOPIC CHOLECYSTECTOMY;  Surgeon: Griselda Miner, MD;  Location: [facility];  Service: General;  Laterality: N/A;  . COLONOSCOPY    .  ESOPHAGOGASTRODUODENOSCOPY (EGD) WITH PROPOFOL N/A 06/15/2020   Procedure: ESOPHAGOGASTRODUODENOSCOPY (EGD) WITH PROPOFOL;  Surgeon: Benancio Deeds, MD;  [facility]: [facility];  Service: Gastroenterology;  Laterality: N/A;  . IR RADIOLOGIST EVAL & MGMT  05/27/2020  . LEEP N/A 12/15/2015   Procedure: LOOP ELECTROSURGICAL EXCISION PROCEDURE (LEEP) with  colpo;  Surgeon: Jerene Bears, MD;  Location: [facility];  Service: Gynecology;  Laterality: N/A;  . PILONIDAL CYST EXCISION  1999   I&D  . VIDEO BRONCHOSCOPY N/A 11/27/2019   Procedure: VIDEO BRONCHOSCOPY WITHOUT FLUORO;  Surgeon: Kalman Shan, MD;  Location: [facility];  Service: Cardiopulmonary;  Laterality: N/A;  . WISDOM TOOTH EXTRACTION       OB History    Gravida  1   Para  1   Term      Preterm      AB      Living  1     SAB      TAB      Ectopic      Multiple      Live Births              Family History  Problem Relation Age of Onset  . Diabetes Mother   . Rheum arthritis Mother   . Diabetes Father   . COPD Father   . Bronchitis Father   . Cancer Brother        Lung  . Breast cancer Maternal Aunt 93  . CAD Neg Hx     Social History   Tobacco Use  . Smoking status: Former Smoker    Packs/day: 1.00    Years: 30.00    Pack years: 30.00    Types: Cigarettes, E-cigarettes    Start date: 1985    Quit date: 02/16/2014    Years since quitting: 6.4  . Smokeless tobacco: Never Used  Vaping Use  . Vaping Use: Former  Substance Use Topics  . Alcohol use: No    Alcohol/week: 0.0 standard drinks  . Drug use: No    Home Medications Prior to Admission medications   Medication Sig Start Date End Date Taking? Authorizing Provider  acetaminophen (TYLENOL) 325 MG tablet Take 2 tablets (650 mg total) by mouth every 6 (six) hours as needed for mild pain (or Fever >/= 101). 06/12/20  Yes Trixie Deis R, PA-C  albuterol (VENTOLIN HFA) 108 (90 Base) MCG/ACT inhaler INHALE 2 PUFFS INTO THE LUNGS EVERY 6 HOURS AS NEEDED FOR WHEEZING OR SHORTNESS OF BREATH Patient taking differently: Inhale 2 puffs into the lungs every 6 (six) hours as needed for wheezing or shortness of breath.  11/07/19  Yes Kalman Shan, MD  benzonatate (TESSALON) 200 MG capsule Take 200 mg by mouth in the morning, at noon, and at bedtime.   Yes [provider]  docusate  sodium (COLACE) 100 MG capsule Take 1 capsule (100 mg total) by mouth daily as needed. Patient taking differently: Take 100 mg by mouth daily as needed for mild constipation.  06/16/20 06/16/21 Yes Kc, Dayna Barker, MD  EPINEPHrine (EPIPEN 2-PAK) 0.3 mg/0.3 mL IJ SOAJ injection Inject 0.3 mg into the muscle as needed for anaphylaxis.    Yes [provider]  escitalopram (LEXAPRO) 10 MG tablet TAKE 1 TABLET BY MOUTH EVERY DAY Patient taking differently: Take 10 mg by mouth daily.  05/13/20  Yes Jerene Bears, MD  hydroxychloroquine (PLAQUENIL) 200 MG tablet Take 200 mg by mouth 2 (two) times daily. 10/17/19  Yes [provider]  MAGNESIUM-OXIDE 400 (241.3 Mg) MG tablet Take 400 mg by mouth daily. 07/22/20  Yes [provider]  pantoprazole (PROTONIX) 40 MG tablet Take 1 tablet (40 mg total) by mouth 2 (two) times daily. 06/16/20 08/15/20 Yes Lanae Boast, MD  potassium chloride (KLOR-CON) 10 MEQ tablet Take 1 tablet (10 mEq total) by mouth daily for 14 days. 07/22/20 08/05/20 Yes Kalman Shan, MD  predniSONE (DELTASONE) 5 MG tablet Take 0.5 tablets (2.5 mg total) by mouth daily with breakfast. Start on 06/15/20 Patient taking differently: Take 2.5 mg by mouth daily with breakfast.  06/16/20  Yes Kc, Ramesh, MD  sucralfate (CARAFATE) 1 g tablet Take 1 g by mouth 4 (four) times daily. 07/30/20  Yes [provider]  diltiazem (CARDIZEM CD) 120 MG 24 hr capsule Take 1 capsule (120 mg total) by mouth as needed. Hold for sbp <100 or hr < 60/min Patient not taking: Reported on 08/04/2020 06/25/20 08/24/20  Dyann Kief, PA-C  dimethicone (NEUTRAPHOR) 1 % cream Apply 1 application topically 2 (two) times daily as needed for up to 14 days for dry skin. Patient not taking: Reported on 08/04/2020 07/22/20 08/05/20  Kalman Shan, MD  ferrous sulfate 325 (65 FE) MG EC tablet Take 1 tablet (325 mg total) by mouth 2 (two) times daily. 06/16/20 08/04/20  Lanae Boast, MD   Magnesium Oxide 400 MG CAPS Take 1 capsule (400 mg total) by mouth daily for 14 days. Patient not taking: Reported on 08/04/2020 07/22/20 08/05/20  Kalman Shan, MD  ondansetron (ZOFRAN) 4 MG tablet Take 1 tablet (4 mg total) by mouth 3 (three) times daily as needed for nausea. Patient not taking: Reported on 08/04/2020 01/10/20   Kalman Shan, MD  oxyCODONE (OXY IR/ROXICODONE) 5 MG immediate release tablet Take 1 tablet (5 mg total) by mouth every 4 (four) hours as needed for moderate pain. Patient not taking: Reported on 08/04/2020 06/12/20   Juliet Rude, PA-C  sucralfate (CARAFATE) 1 GM/10ML suspension Take 10 mLs (1 g total) by mouth 4 (four) times daily -  with meals and at bedtime. Patient not taking: Reported on 08/04/2020 06/16/20   Lanae Boast, MD    Allergies    Bee venom, Benadryl allergy [diphenhydramine hcl], Codeine, Pirfenidone, and Penicillins  Review of Systems   Review of Systems  All other systems reviewed and are negative.   Physical Exam Updated Vital Signs BP (!) 120/58   Pulse 100   Temp 98.2 F (36.8 C) (Oral)   Resp (!) 36   LMP 09/06/2004   SpO2 95%   Physical Exam Vitals and nursing note reviewed.  Constitutional:      Appearance: She is well-developed. She is not diaphoretic.  HENT:     Head: Normocephalic and atraumatic.     Right Ear: External ear normal.     Left Ear: External ear normal.  Eyes:     General: No scleral icterus.       Right eye: No discharge.        Left eye: No discharge.     Conjunctiva/sclera: Conjunctivae normal.  Neck:     Trachea: No tracheal deviation.  Cardiovascular:     Rate and Rhythm: Normal rate and regular rhythm.  Pulmonary:     Effort: Pulmonary effort is normal. No respiratory distress.     Breath sounds: No stridor. Rales present. No wheezing.  Abdominal:     General: Bowel sounds are normal. There is no distension.     Palpations: Abdomen  is soft.     Tenderness: There is no abdominal  tenderness. There is no guarding or rebound.  Musculoskeletal:        General: No tenderness.     Cervical back: Neck supple.  Skin:    General: Skin is warm and dry.     Findings: No rash.  Neurological:     Mental Status: She is alert.     Cranial Nerves: No cranial nerve deficit (no facial droop, extraocular movements intact, no slurred speech).     Sensory: No sensory deficit.     Motor: No abnormal muscle tone or seizure activity.     Coordination: Coordination normal.     ED Results / Procedures / Treatments   Labs (all labs ordered are listed, but only abnormal results are displayed) Labs Reviewed  BASIC METABOLIC PANEL - Abnormal; Notable for the following components:      Result Value   Sodium 133 (*)    Chloride 94 (*)    Glucose, Bld 106 (*)    All other components within normal limits  CBC - Abnormal; Notable for the following components:   WBC 16.7 (*)    Hemoglobin 10.7 (*)    HCT 35.7 (*)    MCH 24.3 (*)    RDW 17.2 (*)    Platelets 589 (*)    All other components within normal limits  D-DIMER, QUANTITATIVE (NOT AT [facility]) - Abnormal; Notable for the following components:   D-Dimer, Quant 2.19 (*)    All other components within normal limits  RESP PANEL BY RT-PCR (FLU A&B, COVID) ARPGX2  BRAIN NATRIURETIC PEPTIDE    EKG None  Radiology CT Angio Chest PE W and/or Wo Contrast  Result Date: 08/04/2020 CLINICAL DATA:  Pulmonary embolism suspected.  Difficulty breathing. EXAM: CT ANGIOGRAPHY CHEST WITH CONTRAST TECHNIQUE: Multidetector CT imaging of the chest was performed using the standard protocol during bolus administration of intravenous contrast. Multiplanar CT image reconstructions and MIPs were obtained to evaluate the vascular anatomy. CONTRAST:  64mL OMNIPAQUE IOHEXOL 350 MG/ML SOLN COMPARISON:  CT 09/26/2019. FINDINGS: Cardiovascular: No convincing evidence of significant pulmonary embolism. One rounded filling defect within the RIGHT upper lobe  pulmonary artery proximally (image 96/5) is nonocclusive. Differential would include a tiny acute pulmonary embolism versus sequelae of remote pulmonary emboli. Mediastinum/Nodes: No axillary or supraclavicular adenopathy. There is RIGHT paratracheal and hilar lymphadenopathy with RIGHT hilar lymph node measuring up to 16 mm short axis. Lungs/Pleura: Extensive cavitary lesions in the upper lobes with central nodularity not changed from comparison exam. There is diffuse new ground-glass opacities in the lower lobes. The RIGHT lobe is more involved than the LEFT. No pleural fluid. Again demonstrated subpleural cystic change in the lower lobes. Subpleural cysts in the RIGHT lower lobe measures 14 mm in greatest dimension (image 115/6) increased from 10 mm on comparison exam. Upper Abdomen: Limited view of the liver, kidneys, pancreas are unremarkable. Normal adrenal glands. Musculoskeletal: Review of the MIP images confirms the above findings. IMPRESSION: 1. No evidence of significant pulmonary embolism. 2. New diffuse severe ground-glass opacities in lower lobes. Differential includes pulmonary edema versus atypical infection including COVID pneumonia. 3. Progressive subpleural cystic change in the RIGHT lower lobe consistent with progressive interstitial lung disease (UIP). 4. Stable severe a cavitary change in the upper lobes with central nodularity. Electronically Signed   By: Genevive Bi M.D.   On: 08/04/2020 15:08   DG Chest Port 1 View  Result Date: 08/04/2020 CLINICAL DATA:  Shortness of breath. EXAM: PORTABLE CHEST 1 VIEW COMPARISON:  June 13, 2020. FINDINGS: The heart size and mediastinal contours are within normal limits. Increased bilateral patchy airspace opacities are noted concerning for multifocal pneumonia. No pneumothorax or pleural effusion is noted. The visualized skeletal structures are unremarkable. IMPRESSION: Increased bilateral patchy airspace opacities are noted concerning for  multifocal pneumonia. Electronically Signed   By: Lupita Raider M.D.   On: 08/04/2020 13:32    Procedures .Critical Care Performed by: Linwood Dibbles, MD Authorized by: Linwood Dibbles, MD   Critical care provider statement:    Critical care time (minutes):  45   Critical care was time spent personally by me on the following activities:  Discussions with consultants, evaluation of patient's response to treatment, examination of patient, ordering and performing treatments and interventions, ordering and review of laboratory studies, ordering and review of radiographic studies, pulse oximetry, re-evaluation of patient's condition, obtaining history from patient or surrogate and review of old charts   (including critical care time)  Medications Ordered in ED Medications  sodium chloride (PF) 0.9 % injection (has no administration in time range)  levofloxacin (LEVAQUIN) IVPB 750 mg (has no administration in time range)  iohexol (OMNIPAQUE) 350 MG/ML injection 100 mL (64 mLs Intravenous Contrast Given 08/04/20 1430)    ED Course  I have reviewed the triage vital signs and the nursing notes.  Pertinent labs & imaging results that were available during my care of the patient were reviewed by me and considered in my medical decision making (see chart for details).  Clinical Course as of Aug 04 1541  Mon Aug 04, 2020  1335 White blood cell count elevated.  Electrolyte panel unremarkable.  D-dimer is elevated.  We will proceed with CT angiogram   [JK]  1335 Chest x-ray is concerning for possible multifocal pneumonia   [JK]  1525 CT scan does not show evidence of PE.  Findings consistent with diffuse pneumonia.  Patient is Covid negative.   [JK]  1525 We will start patient on antibiotics.   [JK]    Clinical Course User Index [JK] Linwood Dibbles, MD   MDM Rules/Calculators/A&P                         Patient presented with complaints of worsening shortness of breath.  She has significant chronic  lung disease.  The patient's oxygenation is stable on supplemental nasal cannula oxygen.  Patient does unfortunately have increasing white blood cell count.  She had an elevated D-dimer so CT scan was performed.  No evidence of PE but it does show diffuse bilateral pneumonia.  This correlates with her physical exam.  Patient started on IV antibiotics.  Will consult with the medical service for admission.    Final Clinical Impression(s) / ED Diagnoses Final diagnoses:  Pneumonia of both lungs due to infectious organism, unspecified part of lung      Linwood Dibbles, MD 08/04/20 [phone]

## 2020-08-04 NOTE — Telephone Encounter (Signed)
Spoke with Tamala Julian, pt's daughter. She wanted to be sure that we put in pt's chart to contact her for any concerns or changes in pt's care. I advised pt that  We will make a note in her chart and also advised her to have pt to let us know when we call that she would like [place] to call her daughter with any changes in her care. She verbalized understanding. Nothing further needed at this time.

## 2020-08-04 NOTE — Progress Notes (Deleted)
Cardiology Office Note    Date:  08/04/2020   ID:  Kimberly Shelton, Kimberly Shelton [birth year], MRN [id number]  PCP:  Kristian Covey, MD  Cardiologist: Verne Carrow, MD EPS: None  No chief complaint on file.   History of Present Illness:  Kimberly Shelton is a 59 y.o. female ***    Past Medical History:  Diagnosis Date  . Abnormal Pap smear 09/2006   HGSIL CIN 2/VAIN/ CIN 3/VAIN-3 CIS  . CHF (congestive heart failure) (HCC)   . COPD (chronic obstructive pulmonary disease) (HCC)   . Foot fracture, left 10/16   hair-line fracture  . History of pneumonia 02/09/2016  . Pneumonia    was hospitalized  . RA (rheumatoid arthritis) (HCC) 07/2009    Past Surgical History:  Procedure Laterality Date  . BIOPSY  06/15/2020   Procedure: BIOPSY;  Surgeon: Benancio Deeds, MD;  [facility]: [facility];  Service: Gastroenterology;;  . BRONCHIAL WASHINGS  11/27/2019   Procedure: BRONCHIAL WASHINGS;  Surgeon: Kalman Shan, MD;  [facility];  Service: Cardiopulmonary;;  . CERVICAL BIOPSY  W/ LOOP ELECTRODE EXCISION  2008   CIN 3  . CERVICAL CONIZATION W/BX N/A 12/15/2015   Procedure: CONIZATION CERVIX WITH BIOPSY ;  Surgeon: Jerene Bears, MD;  Location: [facility];  Service: Gynecology;  Laterality: N/A;  POSSIBLE COLD KNIFE CONE.  Please have the microscope in the room  . CHOLECYSTECTOMY N/A 06/10/2020   Procedure: LAPAROSCOPIC CHOLECYSTECTOMY;  Surgeon: Griselda Miner, MD;  Location: [facility];  Service: General;  Laterality: N/A;  . COLONOSCOPY    . ESOPHAGOGASTRODUODENOSCOPY (EGD) WITH PROPOFOL N/A 06/15/2020   Procedure: ESOPHAGOGASTRODUODENOSCOPY (EGD) WITH PROPOFOL;  Surgeon: Benancio Deeds, MD;  [facility];  Service: Gastroenterology;  Laterality: N/A;  . IR RADIOLOGIST EVAL & MGMT  05/27/2020  . LEEP N/A 12/15/2015   Procedure: LOOP ELECTROSURGICAL EXCISION PROCEDURE (LEEP) with colpo;  Surgeon: Jerene Bears, MD;  Location: [facility];  Service:  Gynecology;  Laterality: N/A;  . PILONIDAL CYST EXCISION  1999   I&D  . VIDEO BRONCHOSCOPY N/A 11/27/2019   Procedure: VIDEO BRONCHOSCOPY WITHOUT FLUORO;  Surgeon: Kalman Shan, MD;  [facility]: [facility];  Service: Cardiopulmonary;  Laterality: N/A;  . WISDOM TOOTH EXTRACTION      Current Medications: No outpatient medications have been marked as taking for the 08/06/20 encounter (Appointment) with Dyann Kief, PA-C.     Allergies:   Bee venom, Benadryl allergy [diphenhydramine hcl], Codeine, Pirfenidone, and Penicillins   Social History   Socioeconomic History  . Marital status: Divorced    Spouse name: Not on file  . Number of children: 1  . Years of education: Not on file  . Highest education level: Not on file  Occupational History  . Occupation: Stage manager: [employer]  Tobacco Use  . Smoking status: Former Smoker    Packs/day: 1.00    Years: 30.00    Pack years: 30.00    Types: Cigarettes, E-cigarettes    Start date: 1985    Quit date: 02/16/2014    Years since quitting: 6.4  . Smokeless tobacco: Never Used  Vaping Use  . Vaping Use: Former  Substance and Sexual Activity  . Alcohol use: No    Alcohol/week: 0.0 standard drinks  . Drug use: No  . Sexual activity: Not Currently    Partners: Male    Birth control/protection: Post-menopausal  Other Topics Concern  . 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 Difficulty of Paying Living Expenses: Not on file  Food Insecurity:   . Worried About Programme researcher, broadcasting/film/video in the Last Year: Not on file  . Ran Out of Food in the Last Year: Not on file  Transportation Needs:   . Lack of Transportation (Medical): Not on file  . Lack of Transportation (Non-Medical): Not on file  Physical Activity:   . Days of Exercise per Week: Not on file  . Minutes of Exercise per Session: Not on file   Stress:   . Feeling of Stress : Not on file  Social Connections:   . Frequency of Communication with Friends and Family: Not on file  . Frequency of Social Gatherings with Friends and Family: Not on file  . Attends Religious Services: Not on file  . Active Member of Clubs or Organizations: Not on file  . Attends Banker Meetings: Not on file  . Marital Status: Not on file     Family History:  The patient's ***family history includes Breast cancer (age of onset: 30) in her maternal aunt; Bronchitis in her father; COPD in her father; Cancer in her brother; Diabetes in her father and mother; Rheum arthritis in her mother.   ROS:   Please see the history of present illness.    ROS All other systems reviewed and are negative.   PHYSICAL EXAM:   VS:  LMP 09/06/2004   Physical Exam  GEN: Well nourished, well developed, in no acute distress  HEENT: normal  Neck: no JVD, carotid bruits, or masses Cardiac:RRR; no murmurs, rubs, or gallops  Respiratory:  clear to auscultation bilaterally, normal work of breathing GI: soft, nontender, nondistended, + BS Ext: without cyanosis, clubbing, or edema, Good distal pulses bilaterally MS: no deformity or atrophy  Skin: warm and dry, no rash Neuro:  Alert and Oriented x 3, Strength and sensation are intact Psych: euthymic mood, full affect  Wt Readings from Last 3 Encounters:  07/22/20 138 lb 9.6 oz (62.9 kg)  06/25/20 137 lb 9.6 oz (62.4 kg)  06/16/20 155 lb 13.8 oz (70.7 kg)      Studies/Labs Reviewed:   EKG:  EKG is*** ordered today.  The ekg ordered today demonstrates ***  Recent Labs: 07/22/2020: ALT 11; Magnesium 1.5 08/04/2020: B Natriuretic Peptide 89.6; BUN 10; Creatinine, Ser 0.56; Hemoglobin 10.7; Platelets 589; Potassium 4.5; Sodium 133   Lipid Panel    Component Value Date/Time   CHOL 188 09/01/2016 0743   TRIG 113.0 09/01/2016 0743   HDL 77.40 09/01/2016 0743   CHOLHDL 2 09/01/2016 0743   VLDL 22.6  09/01/2016 0743   LDLCALC 88 09/01/2016 0743    Additional studies/ records that were reviewed today include:  ***    ASSESSMENT:    No diagnosis found.   PLAN:  In order of problems listed above:      Medication Adjustments/Labs and Tests Ordered: Current medicines are reviewed at length with the patient today.  Concerns regarding medicines are outlined above.  Medication changes, Labs and Tests ordered today are listed in the Patient Instructions below. There are no Patient Instructions on file for this visit.   Signed, Jacolyn Reedy, PA-C  08/04/2020 3:22 PM    [facility] [address] [place], [place], [place]  [address] Phone: [phone]; Fax: [phone]

## 2020-08-04 NOTE — ED Triage Notes (Signed)
Patient BIBA from home with c/c difficulty breathing this AM. Reports she took inhaler w/ no relief but felt better by the time EMS arrived. Hx COPD.   BP 154/82 P 86 RR 16 97% 3 L [facility]

## 2020-08-04 NOTE — Consult Note (Signed)
NAME:  Kimberly Shelton, MRN:  [id number], DOB:  1960-11-14, LOS: 0 ADMISSION DATE:  08/04/2020, CONSULTATION DATE:  11/29 REFERRING MD:  Dr. Dairl Ponder, CHIEF COMPLAINT:  ILD   Brief History   59 year old female with history of ILD and new oxygen requirement admitted to [facility] long 11/29 with progressive dyspnea.  History of present illness   59 year old female with past medical history as below, which is significant for interstitial lung disease, UIP, and aspergilloma.  She is followed by Dr. [name].  Multiple treatment modalities have been tried over the course of her disease process, however, she has been frustrated multiple complications/adverse reactions.  She has been unable to tolerate OFEV and Esbriet. Also has had to stop voriconazole due to GI issues.  Most recently she was admitted in October of this year for cholecystitis and underwent cholecystectomy.  At that time she was not on oxygen, but feels her course is continued to worsen since that admission.  She has developed progressive dyspnea and fatigue.  She presented to the pulmonary clinic on 11/16 with time she was noted to have oxygen saturation of 86% and was started on 2 L of oxygen.  She is need to increase this to 3 L in the following week.  On 11/29 she presented to [facility] long emergency department with complaints of progressive dyspnea specifically on exertion.  CT angiogram was done to rule out PE which was negative, however, it did describe some opacifications are concerning for pneumonia.  She was started on empiric antibiotics and admitted to medical floor.  Pulmonary was consulted  Past Medical History   has a past medical history of Abnormal Pap smear (09/2006), CHF (congestive heart failure) (HCC), COPD (chronic obstructive pulmonary disease) (HCC), Foot fracture, left (10/16), History of pneumonia (02/09/2016), Interstitial lung disease (HCC), Pneumonia, and RA (rheumatoid arthritis) (HCC) (07/2009).   Significant Hospital  Events     Consults:    Procedures:    Significant Diagnostic Tests:  CT angiogram 11/29 > No evidence of significant pulmonary embolism. New diffuse severe ground-glass opacities in lower lobes. Differential includes pulmonary edema versus atypical infection including COVID pneumonia. Progressive subpleural cystic change in the RIGHT lower lobe consistent with progressive interstitial lung disease (UIP). Stable severe a cavitary change in the upper lobes with central nodularity.  Micro Data:    Antimicrobials:  Levofloxacin 11/29 >  Interim history/subjective:    Objective   Blood pressure 117/61, pulse 100, temperature 98.3 F (36.8 C), temperature source Oral, resp. rate (!) 29, last menstrual period 09/06/2004, SpO2 97 %.       No intake or output data in the 24 hours ending 08/04/20 1724 There were no vitals filed for this visit.  Examination: General: thin female resting comfortably in bed.  HENT: [place]/AT, PERRL, no JVD Lungs: No distress. Rales throughout more pronounced at the bases.  Cardiovascular: RRR, no MRG Abdomen: Non-distended Extremities: No acute deformity or ROM limitation. Ulnar deviation of fingers.  Neuro: Alert, oriented, non-focal  Resolved Hospital Problem list     Assessment & Plan:   Acute on chronic hypoxemic respiratory failure: known history of ILD followed by Dr. [name]. CT imaging in the ED consistent with progression of known disease, however, cannot rule out bacterial infection. She is not volume overloaded on exam.  - Supplemental O2 titrated to SpO2 >90% - continue home prednisone , no role for pulse steroids at this time - Reasonable to treat empirically with antibiotics - Unfortunately this is likely a progression  of her known disease and up titration of oxygen may be all we have left to offer. - will follow along for 24 - 48 hours and gauge her response to antibiotic, suspect she will be stable for discharge to home with  increased O2 flow rate.   Pulmonary aspergilloma: no longer taking voriconazole since admission in October. No hemoptysis, therefore no role for embolectomy at this time.  - Supportive care    Best practice (evaluated daily)   Diet: Regular Pain/Anxiety/Delirium protocol (if indicated): NA VAP protocol (if indicated): NA DVT prophylaxis: per primary GI prophylaxis: per primary Glucose control: NA Mobility: Per primary last date of multidisciplinary goals of care discussion >> Family and staff present  Summary of discussion  Follow up goals of care discussion due Code Status: DNR Disposition: Tele  Labs   CBC: Recent Labs  Lab 08/04/20 1227  WBC 16.7*  HGB 10.7*  HCT 35.7*  MCV 81.0  PLT 589*    Basic Metabolic Panel: Recent Labs  Lab 08/04/20 1227  NA 133*  K 4.5  CL 94*  CO2 26  GLUCOSE 106*  BUN 10  CREATININE 0.56  CALCIUM 8.9   GFR: Estimated Creatinine Clearance: 66.8 mL/min (by C-G formula based on SCr of 0.56 mg/dL). Recent Labs  Lab 08/04/20 1227  WBC 16.7*    Liver Function Tests: No results for input(s): AST, ALT, ALKPHOS, BILITOT, PROT, ALBUMIN in the last 168 hours. No results for input(s): LIPASE, AMYLASE in the last 168 hours. No results for input(s): AMMONIA in the last 168 hours.  ABG No results found for: PHART, PCO2ART, PO2ART, HCO3, TCO2, ACIDBASEDEF, O2SAT   Coagulation Profile: No results for input(s): INR, PROTIME in the last 168 hours.  Cardiac Enzymes: No results for input(s): CKTOTAL, CKMB, CKMBINDEX, TROPONINI in the last 168 hours.  HbA1C: No results found for: HGBA1C  CBG: No results for input(s): GLUCAP in the last 168 hours.  Review of Systems:   Bolds are positive  Constitutional: weight loss, gain, night sweats, Fevers, chills, fatigue .  HEENT: headaches, Sore throat, sneezing, nasal congestion, post nasal drip, Difficulty swallowing, Tooth/dental problems, visual complaints visual changes, ear ache CV:   chest pain, radiates:,Orthopnea, PND, swelling in lower extremities, dizziness, palpitations, syncope.  GI  heartburn, indigestion, abdominal pain, nausea, vomiting, diarrhea, change in bowel habits, loss of appetite, bloody stools.  Resp: cough, productive: , hemoptysis, dyspnea, chest pain, pleuritic.  Skin: rash or itching or icterus GU: dysuria, change in color of urine, urgency or frequency. flank pain, hematuria  MS: joint pain or swelling. decreased range of motion  Psych: change in mood or affect. depression or anxiety.  Neuro: difficulty with speech, weakness, numbness, ataxia    Past Medical History  She,  has a past medical history of Abnormal Pap smear (09/2006), CHF (congestive heart failure) (HCC), COPD (chronic obstructive pulmonary disease) (HCC), Foot fracture, left (10/16), History of pneumonia (02/09/2016), Interstitial lung disease (HCC), Pneumonia, and RA (rheumatoid arthritis) (HCC) (07/2009).   Surgical History    Past Surgical History:  Procedure Laterality Date  . BIOPSY  06/15/2020   Procedure: BIOPSY;  Surgeon: Benancio Deeds, MD;  Location: [facility];  Service: Gastroenterology;;  . BRONCHIAL WASHINGS  11/27/2019   Procedure: BRONCHIAL WASHINGS;  Surgeon: Kalman Shan, MD;  Location: [facility];  Service: Cardiopulmonary;;  . CERVICAL BIOPSY  W/ LOOP ELECTRODE EXCISION  2008   CIN 3  . CERVICAL CONIZATION W/BX N/A 12/15/2015   Procedure: CONIZATION CERVIX WITH BIOPSY ;  Surgeon: Jerene Bears, MD;  Location: [facility];  Service: Gynecology;  Laterality: N/A;  POSSIBLE COLD KNIFE CONE.  Please have the microscope in the room  . CHOLECYSTECTOMY N/A 06/10/2020   Procedure: LAPAROSCOPIC CHOLECYSTECTOMY;  Surgeon: Griselda Miner, MD;  Location: [facility];  Service: General;  Laterality: N/A;  . COLONOSCOPY    . ESOPHAGOGASTRODUODENOSCOPY (EGD) WITH PROPOFOL N/A 06/15/2020   Procedure: ESOPHAGOGASTRODUODENOSCOPY (EGD) WITH PROPOFOL;  Surgeon: Benancio Deeds, MD;  Location: [facility];  Service: Gastroenterology;  Laterality: N/A;  . IR RADIOLOGIST EVAL & MGMT  05/27/2020  . LEEP N/A 12/15/2015   Procedure: LOOP ELECTROSURGICAL EXCISION PROCEDURE (LEEP) with colpo;  Surgeon: Jerene Bears, MD;  Location: [facility];  Service: Gynecology;  Laterality: N/A;  . PILONIDAL CYST EXCISION  1999   I&D  . VIDEO BRONCHOSCOPY N/A 11/27/2019   Procedure: VIDEO BRONCHOSCOPY WITHOUT FLUORO;  Surgeon: Kalman Shan, MD;  Location: [facility];  Service: Cardiopulmonary;  Laterality: N/A;  . WISDOM TOOTH EXTRACTION       Social History   reports that she quit smoking about 6 years ago. Her smoking use included cigarettes and e-cigarettes. She started smoking about 36 years ago. She has a 30.00 pack-year smoking history. She has never used smokeless tobacco. She reports that she does not drink alcohol and does not use drugs.   Family History   Her family history includes Breast cancer (age of onset: 79) in her maternal aunt; Bronchitis in her father; COPD in her father; Cancer in her brother; Diabetes in her father and mother; Rheum arthritis in her mother. There is no history of CAD.   Allergies Allergies  Allergen Reactions  . Bee Venom Anaphylaxis  . Benadryl Allergy [Diphenhydramine Hcl]     Went into afib  . Codeine Other (See Comments)    "seeing spots"  . Pirfenidone Other (See Comments)    GI side effects  . Penicillins Rash    Has patient had a PCN reaction causing immediate rash, facial/tongue/throat swelling, SOB or lightheadedness with hypotension: No Has patient had a PCN reaction causing severe rash involving mucus membranes or skin necrosis: No Has patient had a PCN reaction that required hospitalization No Has patient had a PCN reaction occurring within the last 10 years: Yes If all of the above answers are "NO", then may proceed with Cephalosporin use.      Home Medications  Prior to Admission medications   Medication Sig  Start Date End Date Taking? Authorizing Provider  acetaminophen (TYLENOL) 325 MG tablet Take 2 tablets (650 mg total) by mouth every 6 (six) hours as needed for mild pain (or Fever >/= 101). 06/12/20  Yes Trixie Deis R, PA-C  albuterol (VENTOLIN HFA) 108 (90 Base) MCG/ACT inhaler INHALE 2 PUFFS INTO THE LUNGS EVERY 6 HOURS AS NEEDED FOR WHEEZING OR SHORTNESS OF BREATH Patient taking differently: Inhale 2 puffs into the lungs every 6 (six) hours as needed for wheezing or shortness of breath.  11/07/19  Yes Kalman Shan, MD  benzonatate (TESSALON) 200 MG capsule Take 200 mg by mouth in the morning, at noon, and at bedtime.   Yes [provider]  docusate sodium (COLACE) 100 MG capsule Take 1 capsule (100 mg total) by mouth daily as needed. Patient taking differently: Take 100 mg by mouth daily as needed for mild constipation.  06/16/20 06/16/21 Yes Kc, Dayna Barker, MD  EPINEPHrine (EPIPEN 2-PAK) 0.3 mg/0.3 mL IJ SOAJ injection Inject 0.3 mg into the muscle as  needed for anaphylaxis.    Yes [provider]  escitalopram (LEXAPRO) 10 MG tablet TAKE 1 TABLET BY MOUTH EVERY DAY Patient taking differently: Take 10 mg by mouth daily.  05/13/20  Yes Jerene Bears, MD  hydroxychloroquine (PLAQUENIL) 200 MG tablet Take 200 mg by mouth 2 (two) times daily. 10/17/19  Yes [provider]  MAGNESIUM-OXIDE 400 (241.3 Mg) MG tablet Take 400 mg by mouth daily. 07/22/20  Yes [provider]  pantoprazole (PROTONIX) 40 MG tablet Take 1 tablet (40 mg total) by mouth 2 (two) times daily. 06/16/20 08/15/20 Yes Lanae Boast, MD  potassium chloride (KLOR-CON) 10 MEQ tablet Take 1 tablet (10 mEq total) by mouth daily for 14 days. 07/22/20 08/05/20 Yes Kalman Shan, MD  predniSONE (DELTASONE) 5 MG tablet Take 0.5 tablets (2.5 mg total) by mouth daily with breakfast. Start on 06/15/20 Patient taking differently: Take 2.5 mg by mouth daily with breakfast.  06/16/20  Yes Kc, Ramesh, MD   sucralfate (CARAFATE) 1 g tablet Take 1 g by mouth 4 (four) times daily. 07/30/20  Yes [provider]  diltiazem (CARDIZEM CD) 120 MG 24 hr capsule Take 1 capsule (120 mg total) by mouth as needed. Hold for sbp <100 or hr < 60/min Patient not taking: Reported on 08/04/2020 06/25/20 08/24/20  Dyann Kief, PA-C  dimethicone (NEUTRAPHOR) 1 % cream Apply 1 application topically 2 (two) times daily as needed for up to 14 days for dry skin. Patient not taking: Reported on 08/04/2020 07/22/20 08/05/20  Kalman Shan, MD  ferrous sulfate 325 (65 FE) MG EC tablet Take 1 tablet (325 mg total) by mouth 2 (two) times daily. 06/16/20 08/04/20  Lanae Boast, MD  Magnesium Oxide 400 MG CAPS Take 1 capsule (400 mg total) by mouth daily for 14 days. Patient not taking: Reported on 08/04/2020 07/22/20 08/05/20  Kalman Shan, MD  ondansetron (ZOFRAN) 4 MG tablet Take 1 tablet (4 mg total) by mouth 3 (three) times daily as needed for nausea. Patient not taking: Reported on 08/04/2020 01/10/20   Kalman Shan, MD  oxyCODONE (OXY IR/ROXICODONE) 5 MG immediate release tablet Take 1 tablet (5 mg total) by mouth every 4 (four) hours as needed for moderate pain. Patient not taking: Reported on 08/04/2020 06/12/20   Juliet Rude, PA-C  sucralfate (CARAFATE) 1 GM/10ML suspension Take 10 mLs (1 g total) by mouth 4 (four) times daily -  with meals and at bedtime. Patient not taking: Reported on 08/04/2020 06/16/20   Lanae Boast, MD      Joneen Roach, AGACNP-BC [facility]  See Amion for personal pager PCCM on call pager [phone]  08/04/2020 5:42 PM

## 2020-08-05 DIAGNOSIS — K269 Duodenal ulcer, unspecified as acute or chronic, without hemorrhage or perforation: Secondary | ICD-10-CM | POA: Diagnosis present

## 2020-08-05 DIAGNOSIS — K208 Other esophagitis without bleeding: Secondary | ICD-10-CM

## 2020-08-05 DIAGNOSIS — K259 Gastric ulcer, unspecified as acute or chronic, without hemorrhage or perforation: Secondary | ICD-10-CM | POA: Diagnosis present

## 2020-08-05 DIAGNOSIS — M05732 Rheumatoid arthritis with rheumatoid factor of left wrist without organ or systems involvement: Secondary | ICD-10-CM

## 2020-08-05 DIAGNOSIS — J84112 Idiopathic pulmonary fibrosis: Secondary | ICD-10-CM

## 2020-08-05 DIAGNOSIS — J9601 Acute respiratory failure with hypoxia: Secondary | ICD-10-CM | POA: Diagnosis not present

## 2020-08-05 DIAGNOSIS — J441 Chronic obstructive pulmonary disease with (acute) exacerbation: Secondary | ICD-10-CM

## 2020-08-05 DIAGNOSIS — J189 Pneumonia, unspecified organism: Principal | ICD-10-CM

## 2020-08-05 DIAGNOSIS — M05731 Rheumatoid arthritis with rheumatoid factor of right wrist without organ or systems involvement: Secondary | ICD-10-CM

## 2020-08-05 LAB — CBC
HCT: 35.4 % — ABNORMAL LOW (ref 36.0–46.0)
Hemoglobin: 10.5 g/dL — ABNORMAL LOW (ref 12.0–15.0)
MCH: 24.3 pg — ABNORMAL LOW (ref 26.0–34.0)
MCHC: 29.7 g/dL — ABNORMAL LOW (ref 30.0–36.0)
MCV: 81.9 fL (ref 80.0–100.0)
Platelets: 546 10*3/uL — ABNORMAL HIGH (ref 150–400)
RBC: 4.32 MIL/uL (ref 3.87–5.11)
RDW: 17.1 % — ABNORMAL HIGH (ref 11.5–15.5)
WBC: 13.4 10*3/uL — ABNORMAL HIGH (ref 4.0–10.5)
nRBC: 0 % (ref 0.0–0.2)

## 2020-08-05 LAB — BASIC METABOLIC PANEL
Anion gap: 12 (ref 5–15)
BUN: 10 mg/dL (ref 6–20)
CO2: 25 mmol/L (ref 22–32)
Calcium: 9.2 mg/dL (ref 8.9–10.3)
Chloride: 99 mmol/L (ref 98–111)
Creatinine, Ser: 0.48 mg/dL (ref 0.44–1.00)
GFR, Estimated: >60 mL/min (ref >60)
Glucose, Bld: 155 mg/dL — ABNORMAL HIGH (ref 70–99)
Potassium: 4.3 mmol/L (ref 3.5–5.1)
Sodium: 136 mmol/L (ref 135–145)

## 2020-08-05 MED ORDER — ENSURE ENLIVE PO LIQD
237.0000 mL | Freq: Two times a day (BID) | ORAL | Status: DC
  Administered 2020-08-05 – 2020-08-06 (×2): 237 mL via ORAL

## 2020-08-05 MED ORDER — ADULT MULTIVITAMIN W/MINERALS CH
1.0000 | ORAL_TABLET | Freq: Every day | ORAL | Status: DC
  Administered 2020-08-05 – 2020-08-06 (×2): 1 via ORAL
  Filled 2020-08-05 (×2): qty 1

## 2020-08-05 MED ORDER — SUCRALFATE 1 GM/10ML PO SUSP
1.0000 g | Freq: Three times a day (TID) | ORAL | Status: DC
  Administered 2020-08-05 – 2020-08-06 (×5): 1 g via ORAL
  Filled 2020-08-05 (×5): qty 10

## 2020-08-05 MED ORDER — SUCRALFATE 1 GM/10ML PO SUSP: 1.0000 g | Freq: Three times a day (TID) | ORAL | Status: DC

## 2020-08-05 NOTE — Progress Notes (Signed)
NAME:  Kimberly Shelton, MRN:  [id number], DOB:  09-Apr-1961, LOS: 1 ADMISSION DATE:  08/04/2020, CONSULTATION DATE:  11/29 REFERRING MD:  Dr. Dairl Ponder, CHIEF COMPLAINT:  ILD   Brief History   59 year old female with history of ILD and new oxygen requirement admitted to [facility] long 11/29 with progressive dyspnea.  History of present illness   60 year old female with past medical history as below, which is significant for interstitial lung disease, UIP, and aspergilloma.  She is followed by Dr. [name].  Multiple treatment modalities have been tried over the course of her disease process, however, she has been frustrated multiple complications/adverse reactions.  She has been unable to tolerate OFEV and Esbriet. Also has had to stop voriconazole due to GI issues.  Most recently she was admitted in October of this year for cholecystitis and underwent cholecystectomy.  At that time she was not on oxygen, but feels her course is continued to worsen since that admission.  She has developed progressive dyspnea and fatigue.  She presented to the pulmonary clinic on 11/16 with time she was noted to have oxygen saturation of 86% and was started on 2 L of oxygen.  She is need to increase this to 3 L in the following week.  On 11/29 she presented to [facility] long emergency department with complaints of progressive dyspnea specifically on exertion.  CT angiogram was done to rule out PE which was negative, however, it did describe some opacifications are concerning for pneumonia.  She was started on empiric antibiotics and admitted to medical floor.  Pulmonary was consulted  Past Medical History   has a past medical history of Abnormal Pap smear (09/2006), CHF (congestive heart failure) (HCC), COPD (chronic obstructive pulmonary disease) (HCC), Foot fracture, left (10/16), History of pneumonia (02/09/2016), Interstitial lung disease (HCC), Pneumonia, and RA (rheumatoid arthritis) (HCC) (07/2009).   Significant Hospital  Events     Consults:    Procedures:    Significant Diagnostic Tests:  CT angiogram 11/29 > No evidence of significant pulmonary embolism. New diffuse severe ground-glass opacities in lower lobes. Differential includes pulmonary edema versus atypical infection including COVID pneumonia. Progressive subpleural cystic change in the RIGHT lower lobe consistent with progressive interstitial lung disease (UIP). Stable severe a cavitary change in the upper lobes with central nodularity.  Micro Data:    Antimicrobials:  Levofloxacin 11/29 >  Interim history/subjective:  Feeling much better today. Spirits are improved and is breathing more comfortably.   Objective   Blood pressure 117/65, Shelton 85, temperature 97.7 F (36.5 C), temperature source Oral, resp. rate 19, height 5\' 3"  (1.6 m), weight 62.8 kg, last menstrual period 09/06/2004, SpO2 92 %.        Intake/Output Summary (Last 24 hours) at 08/05/2020 1152 Last data filed at 08/04/2020 1800 Gross per 24 hour  Intake 100 ml  Output --  Net 100 ml   Filed Weights   08/04/20 1709  Weight: 62.8 kg    Examination: General: thin female resting comfortably in bed.   HENT: normocephalic Lungs: bilateral coarse crackles.  Cardiovascular: RRR, no MRG Abdomen: Non-distended Extremities: No acute deformity or ROM limitation. Ulnar deviation of fingers.  Neuro: Alert, oriented, non-focal  Resolved Hospital Problem list     Assessment & Plan:   Acute on chronic hypoxemic respiratory failure: known history of ILD followed by Dr. 08/06/20. CT imaging in the ED consistent with progression of known disease, however, cannot rule out bacterial infection. She is not volume overloaded on exam.  -  Supplemental O2 titrated to SpO2 >90% (on 4L now) - continue home prednisone 5mg , no role for Shelton steroids at this time - Levofloxacin for a 5 day course.  - Unfortunately this is likely a progression of her known disease and up titration  of oxygen may be all we have left to offer. - Home nebulizer order placed as she feels they have been beneficial. Plan to discharge on PRN duoneb.  - Has follow-up with Dr. scheduled for 12/9 - She would like to remain inpatient for one more night with tentative plans for discharge tomorrow, which seems reasonable.  - Will need ambulatory saturation assessment to determine home O2 flow rate prior to discharge.   Pulmonary aspergilloma: no longer taking voriconazole since admission in October. No hemoptysis, therefore no role for embolectomy at this time.  - Supportive care    Best practice (evaluated daily)   Diet: Regular Pain/Anxiety/Delirium protocol (if indicated): NA VAP protocol (if indicated): NA DVT prophylaxis: per primary GI prophylaxis: per primary Glucose control: NA Mobility: Per primary last date of multidisciplinary goals of care discussion  Family and staff present  Summary of discussion  Follow up goals of care discussion due Code Status: DNR Disposition: Tele  Labs   CBC: Recent Labs  Lab 08/04/20 1227 08/05/20 0455  WBC 16.7* 13.4*  HGB 10.7* 10.5*  HCT 35.7* 35.4*  MCV 81.0 81.9  PLT 589* 546*    Basic Metabolic Panel: Recent Labs  Lab 08/04/20 1227 08/05/20 0455  NA 133* 136  K 4.5 4.3  CL 94* 99  CO2 26 25  GLUCOSE 106* 155*  BUN 10 10  CREATININE 0.56 0.48  CALCIUM 8.9 9.2   GFR: Estimated Creatinine Clearance: 61.9 mL/min (by C-G formula based on SCr of 0.48 mg/dL). Recent Labs  Lab 08/04/20 1227 08/05/20 0455  WBC 16.7* 13.4*    Liver Function Tests: No results for input(s): AST, ALT, ALKPHOS, BILITOT, PROT, ALBUMIN in the last 168 hours. No results for input(s): LIPASE, AMYLASE in the last 168 hours. No results for input(s): AMMONIA in the last 168 hours.  ABG No results found for: PHART, PCO2ART, PO2ART, HCO3, TCO2, ACIDBASEDEF, O2SAT   Coagulation Profile: No results for input(s): INR, PROTIME in the last 168  hours.  Cardiac Enzymes: No results for input(s): CKTOTAL, CKMB, CKMBINDEX, TROPONINI in the last 168 hours.  HbA1C: No results found for: HGBA1C  CBG: No results for input(s): GLUCAP in the last 168 hours.    08/07/20, AGACNP-BC [place] [facility]  See Amion for personal pager PCCM on call pager [phone]  08/05/2020 11:52 AM

## 2020-08-05 NOTE — Progress Notes (Signed)
Patient refused to do ambulatory saturation at this time. She stated she wanted to rest and try walking in the morning. Will continue to monitor the patient.

## 2020-08-05 NOTE — TOC Initial Note (Signed)
Transition of Care [facility]) - Initial/Assessment Note    Patient Details  Name: Kimberly Shelton MRN: [id number] Date of Birth: [birth year]  [facility] of [facility]) CM/SW Contact:    Ida Rogue, LCSW Phone Number: 08/05/2020, 2:47 PM  Clinical Narrative:  Patient has order for DME nebulizer.  Spoke with Ian Malkin at [employer] who will arrange for delivery of same. TOC will continue to follow during the course of hospitalization.                  Expected Discharge Plan: Home/Self Care Barriers to Discharge: No Barriers Identified   Patient Goals and CMS Choice        Expected Discharge Plan and Services Expected Discharge Plan: Home/Self Care                                              Prior Living Arrangements/Services                       Activities of Daily Living Home Assistive Devices/Equipment: Oxygen ADL Screening (condition at time of admission) Patient's cognitive ability adequate to safely complete daily activities?: Yes Is the patient deaf or have difficulty hearing?: No Does the patient have difficulty seeing, even when wearing glasses/contacts?: No Does the patient have difficulty concentrating, remembering, or making decisions?: No Patient able to express need for assistance with ADLs?: Yes Does the patient have difficulty dressing or bathing?: Yes Independently performs ADLs?: No Communication: Independent Dressing (OT): Independent Grooming: Independent Feeding: Independent Bathing: Independent Toileting: Needs assistance Is this a change from baseline?: Change from baseline, expected to last >3days In/Out Bed: Needs assistance Is this a change from baseline?: Change from baseline, expected to last >3 days Walks in Home: Needs assistance Is this a change from baseline?: Change from baseline, expected to last >3 days Does the patient have difficulty walking or climbing stairs?: Yes Weakness of Legs: Both Weakness of Arms/Hands:  Both  Permission Sought/Granted                  Emotional Assessment              Admission diagnosis:  Acute respiratory failure with hypoxia (HCC) [J96.01] Pneumonia of both lungs due to infectious organism, unspecified part of lung [J18.9] Patient Active Problem List   Diagnosis Date Noted  . Acute respiratory failure with hypoxia (HCC) 08/04/2020  . COPD with acute exacerbation (HCC) 08/04/2020  . Upper GI bleed   . Atrial fibrillation (HCC)   . Acute calculous cholecystitis 06/09/2020  . Nausea & vomiting 06/09/2020  . Hiatal hernia 06/09/2020  . Immunosuppression due to drug therapy (HCC) 06/09/2020  . Conductive hearing loss of both ears 06/09/2020  . Emphysema of lung (HCC) 06/09/2020  . Interstitial lung disease due to connective tissue disease (HCC) 06/09/2020  . Coronary artery calcification of native artery 06/09/2020  . Bronchiectasis (HCC) 06/09/2020  . Cholecystitis 06/09/2020  . Constipation 08/13/2019  . Chronic Hemoptysis from cavitary Aspergillosis 08/01/2019  . Medication monitoring encounter 03/20/2019  . Aspergillosis (HCC) 03/14/2019  . Former smoker 03/13/2019  . Referred otalgia of both ears 03/13/2019  . Cavitary lung disease 02/27/2018  . Aortic atherosclerosis (HCC) 12/29/2017  . Atrial tachycardia (HCC) 12/29/2017  . Atrial septal aneurysm 12/28/2017  . History of pneumonia 02/09/2016  . History of smoking 02/09/2016  .  CIN III (cervical intraepithelial neoplasia III) 09/21/2015  . Rheumatoid arthritis involving multiple joints (HCC) 08/09/2014  . Chronic diastolic heart failure (HCC) 02/19/2014  . DEPRESSION 03/26/2009  . CARPAL TUNNEL SYNDROME 03/26/2009  . ARTHRALGIA 03/26/2009   PCP:  Kristian Covey, MD Pharmacy:   [facility] [phone] - [place], [place] - [address] [facility] [address] [place] [address] Phone: [phone] Fax: [phone]     Social  Determinants of Health (SDOH) Interventions    Readmission Risk Interventions No flowsheet data found.

## 2020-08-05 NOTE — Progress Notes (Signed)
Initial Nutrition Assessment  RD working remotely.  DOCUMENTATION CODES:   Not applicable  INTERVENTION:   - MVI with minerals daily  - Ensure Enlive po BID, each supplement provides 350 kcal and 20 grams of protein  NUTRITION DIAGNOSIS:   Increased nutrient needs related to chronic illness (COPD, CHF, ILD) as evidenced by estimated needs.  GOAL:   Patient will meet greater than or equal to 90% of their needs  MONITOR:   PO intake, Supplement acceptance, Labs, Weight trends, I & O's  REASON FOR ASSESSMENT:   Malnutrition Screening Tool    ASSESSMENT:   59 year old female who presented to the ED on 11/29 with SOB. PMH of COPD, bronchiectasis, CHF, ILD. Pt with recent admission for cholecystitis and GI bleed.   Unable to reach pt via phone call to room.  Reviewed weight history in chart. Pt with an 8.8 kg weight loss since 10/31/19. This is a 12.3% weight loss in 9 months which is not significant for timeframe but is concerning given progressive nature. Pt is at risk for malnutrition.  RD to order oral nutrition supplements to aid pt in meeting kcal and protein needs. Will also order daily MVI with minerals.  Medications reviewed and include: protonix, prednisone, sucralfate, IV abx  Labs reviewed.  NUTRITION - FOCUSED PHYSICAL EXAM:  Unable to complete at this time. RD working remotely.  Diet Order:   Diet Order            Diet Heart Room service appropriate? Yes; Fluid consistency: Thin  Diet effective now                 EDUCATION NEEDS:   Not appropriate for education at this time  Skin:  Skin Assessment: Reviewed RN Assessment  Last BM:  08/04/20  Height:   Ht Readings from Last 1 Encounters:  08/04/20 5\' 3"  (1.6 m)    Weight:   Wt Readings from Last 1 Encounters:  08/04/20 62.8 kg    BMI:  Body mass index is 24.52 kg/m.  Estimated Nutritional Needs:   Kcal:  1700-1900  Protein:  80-95 grams  Fluid:  1.7-1.9 L    08/06/20, MS, RD, LDN Inpatient Clinical Dietitian Please see [facility] for contact information.

## 2020-08-05 NOTE — Progress Notes (Signed)
PROGRESS NOTE    Kimberly Shelton  LOV:[id number] DOB: 1961-01-09 DOA: 08/04/2020 PCP: Kristian Covey, MD    Chief Complaint  Patient presents with  . Shortness of Breath    Brief Narrative:  HPI per Dr. Dairl Ponder This is a 59 year old female who has been vaccinated against Covid with past medical history of COPD and bronchiectasis, CHF, ILD and follows with Dr. [name], RA on low-dose steroids, pulmonary aspergilloma off voriconazole since October 2021 for GI issues, recent lap chole in October, history of streptococcal pneumonia with cavitation, tobacco use who presented to the ED with worsening shortness of breath for the past 4 days.  States that ever since her recent cholecystectomy at the end of October she has been having more shortness of breath.  This has been worsening and acutely worsened towards the end of last week and was placed on oxygen by her pulmonologist starting on Friday at 2 LPM but this has been increasing throughout the weekend and required 4 LPM today.  Has not had any improvement with her inhalers at home.  Checked her O2 sat at home and noted SPO2 in the low 80s which prompted her to come to the ED.  Admits to minimally productive cough with clear sputum.  Denies any fevers, sick contacts or any other issues.  Of note she also recently got her booster shot last week   ED Course: Afebrile, tachycardic, tachypneic,  hemodynamically stable, on 4 L/min. Notable Labs: Sodium 133, K4.5, chloride 94, glucose 106, BNP 89, WBC 16.7, Hb 10.7, platelets 589, D-dimer 2.19, flu and COVID-19 negative. Notable Imaging: CXR with increased bilateral patchy airspace opacities concerning for multifocal pneumonia.  CTA chest without PE but new diffuse severe groundglass opacities in lower lobes concerning for pulmonary edema versus atypical infection and progressive subpleural cystic change in the right lower lobe consistent with progressive interstitial lung disease and stable severe  cavitary change in the upper lobes with central nodularity.  She received Levaquin  Assessment & Plan:   Principal Problem:   Acute respiratory failure with hypoxia (HCC) Active Problems:   Rheumatoid arthritis involving multiple joints (HCC)   Former smoker   Emphysema of lung (HCC)   Bronchiectasis (HCC)   COPD with acute exacerbation (HCC)   Gastric ulcer: Per EGD 06/15/2020   Duodenal ulcer: Per EGD 06/15/2020   [place] grade D esophagitis: The EGD 06/15/2020  1 acute hypoxic respiratory failure concerning for acute COPD exacerbation, possible multifocal pneumonia in the setting of complex pulmonary history (COPD, bronchiectasis, ILD, UIP, aspergilloma and strep pneumo pneumonia in the past with cavitary lesion) Patient presented with hypoxia with sats dropping into the high 80s on conversation per admitting physician despite 4 L nasal cannula and then improved to the low 90s.  CT chest which was done concerning for possible multifocal pneumonia, progression of UIP pattern of fibrosis with worsening of groundglass opacities noted.  SARS coronavirus 2 PCR done negative.  Influenza A and B negative.  Check a sputum Gram stain and culture.  Check urine Legionella antigen.  Check urine pneumococcus antigen.  Continue IV Levaquin, scheduled duo nebs, PPI, IV steroid taper.  PCCM following and appreciate input and recommendations.  2.  Thrombocytosis Likely reactive.  Monitor.  3.  Rheumatoid arthritis Patient noted to be on low-dose steroids and Plaquenil prior to admission.  Currently on IV Solu-Medrol.  Continue Plaquenil.  Outpatient follow-up.  4.  Chronic diastolic heart failure Stable.  Currently euvolemic.  BNP is in the  80s.  5.  LA grade D esophagitis/gastric ulcer/duodenal ulcer/hiatal hernia Noted on recent upper endoscopy 06/15/2020.  Patient with no overt bleeding.  Continue PPI twice daily.  Resume home regimen Carafate.   DVT prophylaxis: Lovenox Code Status:  DNR Family Communication: Updated patient.  No family at bedside. Disposition:   Status is: Inpatient    Dispo: The patient is from: Home              Anticipated d/c is to: Home              Anticipated d/c date is: 2 to 3 days.              Patient currently on IV antibiotics, IV steroids, not stable for discharge.       Consultants:   PCCM: Dr. Celine Mans 08/04/2020  Procedures:  CT angiogram chest 08/04/2020  Chest x-ray 08/04/2020    Antimicrobials  IV Levaquin 08/04/2020>>>>   Subjective: Patient sitting up in bed.  States her cough is becoming more productive with chest clear sputum.  Denies any chest pain.  Slight improvement with shortness of breath since admission.  Objective: Vitals:   08/05/20 0524 08/05/20 0812 08/05/20 1343 08/05/20 1412  BP: 117/65  (!) 107/59   Pulse: 85  (!) 102   Resp: 19  19   Temp: 97.7 F (36.5 C)  97.9 F (36.6 C)   TempSrc: Oral     SpO2: 96% 92% 97% 97%  Weight:      Height:        Intake/Output Summary (Last 24 hours) at 08/05/2020 1628 Last data filed at 08/04/2020 1800 Gross per 24 hour  Intake 100 ml  Output --  Net 100 ml   Filed Weights   08/04/20 1709  Weight: 62.8 kg    Examination:  General exam: Appears calm and comfortable  Respiratory system: Diffuse fine crackles noted.  No wheezing.  Fair air movement.  Speaking in full sentences.  Cardiovascular system: S1 & S2 heard, RRR. No JVD, murmurs, rubs, gallops or clicks. No pedal edema. Gastrointestinal system: Abdomen is nondistended, soft and nontender. No organomegaly or masses felt. Normal bowel sounds heard. Central nervous system: Alert and oriented. No focal neurological deficits. Extremities: Ulnar deviation with swan-neck deformity of the hands bilaterally. Skin: No rashes, lesions or ulcers Psychiatry: Judgement and insight appear normal. Mood & affect appropriate.     Data Reviewed: I have personally reviewed following labs and imaging  studies  CBC: Recent Labs  Lab 08/04/20 1227 08/05/20 0455  WBC 16.7* 13.4*  HGB 10.7* 10.5*  HCT 35.7* 35.4*  MCV 81.0 81.9  PLT 589* 546*    Basic Metabolic Panel: Recent Labs  Lab 08/04/20 1227 08/05/20 0455  NA 133* 136  K 4.5 4.3  CL 94* 99  CO2 26 25  GLUCOSE 106* 155*  BUN 10 10  CREATININE 0.56 0.48  CALCIUM 8.9 9.2    GFR: Estimated Creatinine Clearance: 61.9 mL/min (by C-G formula based on SCr of 0.48 mg/dL).  Liver Function Tests: No results for input(s): AST, ALT, ALKPHOS, BILITOT, PROT, ALBUMIN in the last 168 hours.  CBG: No results for input(s): GLUCAP in the last 168 hours.   Recent Results (from the past 240 hour(s))  Resp Panel by RT-PCR (Flu A&B, Covid) Nasopharyngeal Swab     Status: None   Collection Time: 08/04/20 12:28 PM   Specimen: Nasopharyngeal Swab; Nasopharyngeal(NP) swabs in vial transport medium  Result Value Ref Range Status  SARS Coronavirus 2 by RT PCR NEGATIVE NEGATIVE Final    Comment: (NOTE) SARS-CoV-2 target nucleic acids are NOT DETECTED.  The SARS-CoV-2 RNA is generally detectable in upper respiratory specimens during the acute phase of infection. The lowest concentration of SARS-CoV-2 viral copies this assay can detect is 138 copies/mL. A negative result does not preclude SARS-Cov-2 infection and should not be used as the sole basis for treatment or other patient management decisions. A negative result may occur with  improper specimen collection/handling, submission of specimen other than nasopharyngeal swab, presence of viral mutation(s) within the areas targeted by this assay, and inadequate number of viral copies(<138 copies/mL). A negative result must be combined with clinical observations, patient history, and epidemiological information. The expected result is Negative.  Fact Sheet for Patients:  BloggerCourse.com  Fact Sheet for Healthcare Providers:   SeriousBroker.it  This test is no t yet approved or cleared by the [place] FDA and  has been authorized for detection and/or diagnosis of SARS-CoV-2 by FDA under an Emergency Use Authorization (EUA). This EUA will remain  in effect (meaning this test can be used) for the duration of the COVID-19 declaration under Section 564(b)(1) of the Act, 21 U.S.C.section 360bbb-3(b)(1), unless the authorization is terminated  or revoked sooner.       Influenza A by PCR NEGATIVE NEGATIVE Final   Influenza B by PCR NEGATIVE NEGATIVE Final    Comment: (NOTE) The Xpert Xpress SARS-CoV-2/FLU/RSV plus assay is intended as an aid in the diagnosis of influenza from Nasopharyngeal swab specimens and should not be used as a sole basis for treatment. Nasal washings and aspirates are unacceptable for Xpert Xpress SARS-CoV-2/FLU/RSV testing.  Fact Sheet for Patients: BloggerCourse.com  Fact Sheet for Healthcare Providers: SeriousBroker.it  This test is not yet approved or cleared by the [place] FDA and has been authorized for detection and/or diagnosis of SARS-CoV-2 by FDA under an Emergency Use Authorization (EUA). This EUA will remain in effect (meaning this test can be used) for the duration of the COVID-19 declaration under Section 564(b)(1) of the Act, 21 U.S.C. section 360bbb-3(b)(1), unless the authorization is terminated or revoked.  Performed at [facility], [address], [place], [place] [address]          Radiology Studies: CT Angio Chest PE W and/or Wo Contrast  Result Date: 08/04/2020 CLINICAL DATA:  Pulmonary embolism suspected.  Difficulty breathing. EXAM: CT ANGIOGRAPHY CHEST WITH CONTRAST TECHNIQUE: Multidetector CT imaging of the chest was performed using the standard protocol during bolus administration of intravenous contrast. Multiplanar CT image reconstructions  and MIPs were obtained to evaluate the vascular anatomy. CONTRAST:  64mL OMNIPAQUE IOHEXOL 350 MG/ML SOLN COMPARISON:  CT 09/26/2019. FINDINGS: Cardiovascular: No convincing evidence of significant pulmonary embolism. One rounded filling defect within the RIGHT upper lobe pulmonary artery proximally (image 96/5) is nonocclusive. Differential would include a tiny acute pulmonary embolism versus sequelae of remote pulmonary emboli. Mediastinum/Nodes: No axillary or supraclavicular adenopathy. There is RIGHT paratracheal and hilar lymphadenopathy with RIGHT hilar lymph node measuring up to 16 mm short axis. Lungs/Pleura: Extensive cavitary lesions in the upper lobes with central nodularity not changed from comparison exam. There is diffuse new ground-glass opacities in the lower lobes. The RIGHT lobe is more involved than the LEFT. No pleural fluid. Again demonstrated subpleural cystic change in the lower lobes. Subpleural cysts in the RIGHT lower lobe measures 14 mm in greatest dimension (image 115/6) increased from 10 mm on comparison exam. Upper Abdomen:  Limited view of the liver, kidneys, pancreas are unremarkable. Normal adrenal glands. Musculoskeletal: Review of the MIP images confirms the above findings. IMPRESSION: 1. No evidence of significant pulmonary embolism. 2. New diffuse severe ground-glass opacities in lower lobes. Differential includes pulmonary edema versus atypical infection including COVID pneumonia. 3. Progressive subpleural cystic change in the RIGHT lower lobe consistent with progressive interstitial lung disease (UIP). 4. Stable severe a cavitary change in the upper lobes with central nodularity. Electronically Signed   By: Genevive Bi M.D.   On: 08/04/2020 15:08   DG Chest Port 1 View  Result Date: 08/04/2020 CLINICAL DATA:  Shortness of breath. EXAM: PORTABLE CHEST 1 VIEW COMPARISON:  June 13, 2020. FINDINGS: The heart size and mediastinal contours are within normal limits.  Increased bilateral patchy airspace opacities are noted concerning for multifocal pneumonia. No pneumothorax or pleural effusion is noted. The visualized skeletal structures are unremarkable. IMPRESSION: Increased bilateral patchy airspace opacities are noted concerning for multifocal pneumonia. Electronically Signed   By: Lupita Raider M.D.   On: 08/04/2020 13:32        Scheduled Meds: . enoxaparin (LOVENOX) injection  40 mg Subcutaneous Q24H  . escitalopram  10 mg Oral Daily  . feeding supplement  237 mL Oral BID BM  . hydroxychloroquine  200 mg Oral BID  . ipratropium-albuterol  3 mL Nebulization TID  . multivitamin with minerals  1 tablet Oral Daily  . pantoprazole  40 mg Oral BID  . [START ON 08/06/2020] predniSONE  40 mg Oral Q breakfast  . sucralfate  1 g Oral TID WC & HS   Continuous Infusions: . levofloxacin (LEVAQUIN) IV       LOS: 1 day    Time spent: 35 minutes    Ramiro Harvest, MD [facility]   To contact the attending provider between 7A-7P or the covering provider during after hours 7P-7A, please log into the web site [email or web address] and access using universal [place] password for that web site. If you do not have the password, please call the hospital operator.  08/05/2020, 4:28 PM

## 2020-08-06 ENCOUNTER — Ambulatory Visit: Payer: 59 | Admitting: Physician Assistant

## 2020-08-06 DIAGNOSIS — M05731 Rheumatoid arthritis with rheumatoid factor of right wrist without organ or systems involvement: Secondary | ICD-10-CM | POA: Diagnosis not present

## 2020-08-06 DIAGNOSIS — J9601 Acute respiratory failure with hypoxia: Secondary | ICD-10-CM | POA: Diagnosis not present

## 2020-08-06 DIAGNOSIS — J410 Simple chronic bronchitis: Secondary | ICD-10-CM | POA: Diagnosis not present

## 2020-08-06 DIAGNOSIS — J84112 Idiopathic pulmonary fibrosis: Secondary | ICD-10-CM | POA: Diagnosis not present

## 2020-08-06 LAB — CBC
HCT: 34 % — ABNORMAL LOW (ref 36.0–46.0)
Hemoglobin: 10.1 g/dL — ABNORMAL LOW (ref 12.0–15.0)
MCH: 24.3 pg — ABNORMAL LOW (ref 26.0–34.0)
MCHC: 29.7 g/dL — ABNORMAL LOW (ref 30.0–36.0)
MCV: 81.7 fL (ref 80.0–100.0)
Platelets: 674 10*3/uL — ABNORMAL HIGH (ref 150–400)
RBC: 4.16 MIL/uL (ref 3.87–5.11)
RDW: 17 % — ABNORMAL HIGH (ref 11.5–15.5)
WBC: 27.1 10*3/uL — ABNORMAL HIGH (ref 4.0–10.5)
nRBC: 0 % (ref 0.0–0.2)

## 2020-08-06 LAB — BASIC METABOLIC PANEL
Anion gap: 13 (ref 5–15)
BUN: 17 mg/dL (ref 6–20)
CO2: 27 mmol/L (ref 22–32)
Calcium: 9.1 mg/dL (ref 8.9–10.3)
Chloride: 97 mmol/L — ABNORMAL LOW (ref 98–111)
Creatinine, Ser: 0.68 mg/dL (ref 0.44–1.00)
GFR, Estimated: >60 mL/min (ref >60)
Glucose, Bld: 127 mg/dL — ABNORMAL HIGH (ref 70–99)
Potassium: 3.6 mmol/L (ref 3.5–5.1)
Sodium: 137 mmol/L (ref 135–145)

## 2020-08-06 LAB — MAGNESIUM: Magnesium: 2.2 mg/dL (ref 1.7–2.4)

## 2020-08-06 MED ORDER — ALBUTEROL SULFATE HFA 108 (90 BASE) MCG/ACT IN AERS: INHALATION_SPRAY | RESPIRATORY_TRACT | 3 refills | Status: AC

## 2020-08-06 MED ORDER — POTASSIUM CHLORIDE ER 10 MEQ PO TBCR: 10.0000 meq | EXTENDED_RELEASE_TABLET | Freq: Every day | ORAL | 0 refills | Status: AC

## 2020-08-06 MED ORDER — IPRATROPIUM-ALBUTEROL 0.5-2.5 (3) MG/3ML IN SOLN: 3.0000 mL | Freq: Three times a day (TID) | RESPIRATORY_TRACT | 0 refills | Status: AC

## 2020-08-06 MED ORDER — MAGNESIUM OXIDE 400 MG PO CAPS: 400.0000 mg | ORAL_CAPSULE | Freq: Every day | ORAL | 0 refills | Status: AC

## 2020-08-06 MED ORDER — DOCUSATE SODIUM 100 MG PO CAPS: 100.0000 mg | ORAL_CAPSULE | Freq: Every day | ORAL | 2 refills | Status: AC | PRN

## 2020-08-06 MED ORDER — LEVOFLOXACIN 750 MG PO TABS: 750.0000 mg | ORAL_TABLET | Freq: Every day | ORAL | 0 refills | Status: AC

## 2020-08-06 MED ORDER — ENSURE ENLIVE PO LIQD: 237.0000 mL | Freq: Two times a day (BID) | ORAL | 12 refills | Status: AC

## 2020-08-06 MED ORDER — POLYETHYLENE GLYCOL 3350 17 G PO PACK: 17.0000 g | PACK | Freq: Every day | ORAL | 0 refills | Status: AC | PRN

## 2020-08-06 MED ORDER — PANTOPRAZOLE SODIUM 40 MG PO TBEC: 40.0000 mg | DELAYED_RELEASE_TABLET | Freq: Two times a day (BID) | ORAL | 1 refills | Status: AC

## 2020-08-06 MED ORDER — ESCITALOPRAM OXALATE 10 MG PO TABS: 10.0000 mg | ORAL_TABLET | Freq: Every day | ORAL | 3 refills | Status: AC

## 2020-08-06 MED ORDER — HYDROXYCHLOROQUINE SULFATE 200 MG PO TABS: 200.0000 mg | ORAL_TABLET | Freq: Two times a day (BID) | ORAL | 0 refills | Status: AC

## 2020-08-06 MED ORDER — FERROUS SULFATE 325 (65 FE) MG PO TBEC: 325.0000 mg | DELAYED_RELEASE_TABLET | Freq: Two times a day (BID) | ORAL | 0 refills | Status: AC

## 2020-08-06 MED ORDER — NEUTRAPHOR 1 % EX CREA: 1.0000 "application " | TOPICAL_CREAM | Freq: Two times a day (BID) | CUTANEOUS | 0 refills | Status: AC | PRN

## 2020-08-06 MED ORDER — ADULT MULTIVITAMIN W/MINERALS CH: 1.0000 | ORAL_TABLET | Freq: Every day | ORAL | 0 refills | Status: AC

## 2020-08-06 MED ORDER — ALBUTEROL SULFATE (2.5 MG/3ML) 0.083% IN NEBU: 2.5000 mg | INHALATION_SOLUTION | RESPIRATORY_TRACT | 12 refills | Status: AC | PRN

## 2020-08-06 NOTE — Discharge Summary (Signed)
Physician Discharge Summary  Patient ID: Kimberly Shelton MRN: [id number] DOB/AGE: Jan 19, 1961 59 y.o.  Admit date: 08/04/2020 Discharge date: 08/06/2020  Admission Diagnoses:  Discharge Diagnoses:  Principal Problem:   Acute respiratory failure with hypoxia (HCC) Active Problems:   Rheumatoid arthritis involving multiple joints (HCC)   Former smoker   Emphysema of lung (HCC)   Bronchiectasis (HCC)   COPD with acute exacerbation (HCC)   Gastric ulcer: Per EGD 06/15/2020   Duodenal ulcer: Per EGD 06/15/2020   [place] grade D esophagitis: The EGD 06/15/2020   Pneumonia of both lungs due to infectious organism   Discharged Condition: poor  Hospital Course:  This is a 59 year oldfemale who has been vaccinated against Covidwith past medical history of COPDand bronchiectasis,CHF, ILD and follows with Dr. [name], RAon low-dose steroids, pulmonary aspergilloma off voriconazole since October 2021 for GI issues,recent [place] of streptococcal pneumonia with cavitation, tobacco usewho presented to the ED with worsening shortness of breath for the past 4 days. States that ever since her recent cholecystectomy at the end of October she has been having more shortness of breath. This has been worsening and acutely worsened towards the end of last week and was placed on oxygen by her pulmonologist starting on Friday at 2LPM but this has been increasing throughout the weekend and required 4 LPM today. Has not had any improvement with her inhalers at home. Checked her O2 sat at home and noted SPO2 in the low 80s which prompted her to come to the ED. Admits to minimally productive cough with clear sputum. Denies any fevers, sick contacts or any other issues. Of note she also recently got her booster shot last week   ED Course:Afebrile, tachycardic, tachypneic, hemodynamically stable, on 4L/min. Notable Labs:Sodium 133, K4.5, chloride 94, glucose 106, BNP  89, WBC 16.7, Hb 10.7, platelets 589, D-dimer 2.19, flu and COVID-19 negative. Notable Imaging:CXR with increased bilateral patchy airspace opacities concerning for multifocal pneumonia.CTA chest without PE but new diffuse severe groundglass opacities in lower lobes concerning for pulmonary edema versus atypical infection and progressive subpleural cystic change in the right lower lobe consistent with progressive interstitial lung disease and stable severe cavitary change in the upper lobes with central nodularity. She received Levaquin  Clinical problems list Principal Problem:   Acute respiratory failure with hypoxia (HCC) Active Problems:   Rheumatoid arthritis involving multiple joints (HCC)   Former smoker   Emphysema of lung (HCC)   Bronchiectasis (HCC)   COPD with acute exacerbation (HCC)   Gastric ulcer: Per EGD 06/15/2020   Duodenal ulcer: Per EGD 06/15/2020   [place] grade D esophagitis: The EGD 06/15/2020  1 acute hypoxic respiratory failure concerning for acute COPD exacerbation, possible multifocal pneumonia in the setting of complex pulmonary history (COPD, bronchiectasis, ILD, UIP, aspergilloma and strep pneumo pneumonia in the past with cavitary lesion) Patient presented with hypoxia with sats dropping into the high 80s on conversation per admitting physician despite 4 L nasal cannula and then improved to the low 90s.  CT chest which was done concerning for possible multifocal pneumonia, progression of UIP pattern of fibrosis with worsening of groundglass opacities noted.  SARS coronavirus 2 PCR done negative.  Influenza A and B negative. Patient was evaluated by pulmonology.  Steroids were discontinued as patient symptoms appears to correlate with worsening condition of heart disease.  Pulmonology did make it known to patient that she will continue to experience shortness of breath.  Her oxygen was titrated up to  4 L/min by nasal cannula.  Pulmonology is okay with  discharging patient home and to follow-up with him in clinic as scheduled. He is discharged home on levofloxacin 750 mg daily for 4 days to complete a total of 5 days.   2.  Thrombocytosis Likely reactive.  Monitor.  3.  Rheumatoid arthritis.  Stable Continue with Plaquenil  4.  Chronic diastolic heart failure Stable.  Currently euvolemic.  BNP is in the 80s. Continue with guideline directed medical therapy  5.  LA grade D esophagitis/gastric ulcer/duodenal ulcer/hiatal hernia Noted on recent upper endoscopy 06/15/2020.  Patient with no overt bleeding.  Continue PPI twice daily.  Resume home regimen Carafate.   Consults: Pulmonology  Significant Diagnostic Studies:  CT chest which was done concerning for possible multifocal pneumonia, progression of UIP pattern of fibrosis with worsening of groundglass opacities noted.  Treatments: Antibiotics, oxygen supplementation and steroid  Discharge Exam: Blood pressure 122/65, pulse (!) 117, temperature 98 F (36.7 C), resp. rate 19, height  (1.6 m), weight 62.8 kg, last menstrual period 09/06/2004, SpO2 93 %. General appearance: alert, cooperative, appears stated age and no distress Nose: Nares normal. Septum midline. Mucosa normal. No drainage or sinus tenderness. Resp: rhonchi bilaterally and wheezes bilaterally Chest wall: no tenderness Cardio: regular rate and rhythm, S1, S2 normal, no murmur, click, rub or gallop GI: soft, non-tender; bowel sounds normal; no masses,  no organomegaly Extremities: extremities normal, atraumatic, no cyanosis or edema Skin: Skin color, texture, turgor normal. No rashes or lesions Neurologic: Alert and oriented X 3, normal strength and tone. Normal symmetric reflexes. Normal coordination and gait  Disposition: Discharge disposition: 01-Home or Self Care       Discharge Instructions    Diet - low sodium heart healthy   Complete by: As directed    Discharge instructions   Complete by:  As directed    Follow up with pulmonologist Dr Celine Mans as scheduled   Home infusion instructions   Complete by: As directed    No infusion medication at discharge   Instructions: Flushing of vascular access device: 0.9% NaCl pre/post medication administration and prn patency; Heparin 100 u/ml, 5ml for implanted ports and Heparin 10u/ml, 5ml for all other central venous catheters.   Increase activity slowly   Complete by: As directed    As tolerated     Allergies as of 08/06/2020      Reactions   Bee Venom Anaphylaxis   Benadryl Allergy [diphenhydramine Hcl]    Went into afib   Codeine Other (See Comments)   "seeing spots"   Pirfenidone Other (See Comments)   GI side effects   Penicillins Rash   Has patient had a PCN reaction causing immediate rash, facial/tongue/throat swelling, SOB or lightheadedness with hypotension: No Has patient had a PCN reaction causing severe rash involving mucus membranes or skin necrosis: No Has patient had a PCN reaction that required hospitalization No Has patient had a PCN reaction occurring within the last 10 years: Yes If all of the above answers are "NO", then may proceed with Cephalosporin use.      Medication List    STOP taking these medications   diltiazem 120 MG 24 hr capsule Commonly known as: Cardizem CD   MAGnesium-Oxide 400 (241.3 Mg) MG tablet Generic drug: magnesium oxide   oxyCODONE 5 MG immediate release tablet Commonly known as: Oxy IR/ROXICODONE   predniSONE 5 MG tablet Commonly known as: DELTASONE   sucralfate 1 g tablet Commonly known as: CARAFATE  sucralfate 1 GM/10ML suspension Commonly known as: CARAFATE     TAKE these medications   acetaminophen 325 MG tablet Commonly known as: TYLENOL Take 2 tablets (650 mg total) by mouth every 6 (six) hours as needed for mild pain (or Fever >/= 101).   albuterol 108 (90 Base) MCG/ACT inhaler Commonly known as: VENTOLIN HFA INHALE 2 PUFFS INTO THE LUNGS EVERY 6 HOURS AS  NEEDED FOR WHEEZING OR SHORTNESS OF BREATH What changed: See the new instructions.   benzonatate 200 MG capsule Commonly known as: TESSALON Take 200 mg by mouth in the morning, at noon, and at bedtime.   docusate sodium 100 MG capsule Commonly known as: Colace Take 1 capsule (100 mg total) by mouth daily as needed. What changed: reasons to take this   EpiPen 2-Pak 0.3 mg/0.3 mL Soaj injection Generic drug: EPINEPHrine Inject 0.3 mg into the muscle as needed for anaphylaxis.   escitalopram 10 MG tablet Commonly known as: LEXAPRO Take 1 tablet (10 mg total) by mouth daily.   feeding supplement Liqd Take 237 mLs by mouth 2 (two) times daily between meals for 7 days.   ferrous sulfate 325 (65 FE) MG EC tablet Take 1 tablet (325 mg total) by mouth 2 (two) times daily.   hydroxychloroquine 200 MG tablet Commonly known as: PLAQUENIL Take 1 tablet (200 mg total) by mouth 2 (two) times daily.   levofloxacin 750 MG tablet Commonly known as: Levaquin Take 1 tablet (750 mg total) by mouth daily for 4 days.   Magnesium Oxide 400 MG Caps Take 1 capsule (400 mg total) by mouth daily for 14 days.   multivitamin with minerals Tabs tablet Take 1 tablet by mouth daily. Start taking on: August 07, 2020   NeutrapHor 1 % cream Generic drug: dimethicone Apply 1 application topically 2 (two) times daily as needed for up to 14 days for dry skin.   ondansetron 4 MG tablet Commonly known as: Zofran Take 1 tablet (4 mg total) by mouth 3 (three) times daily as needed for nausea.   pantoprazole 40 MG tablet Commonly known as: Protonix Take 1 tablet (40 mg total) by mouth 2 (two) times daily.   polyethylene glycol 17 g packet Commonly known as: MIRALAX / GLYCOLAX Take 17 g by mouth daily as needed for up to 14 days for mild constipation.   potassium chloride 10 MEQ tablet Commonly known as: KLOR-CON Take 1 tablet (10 mEq total) by mouth daily.            Home Infusion Instuctions   (From admission, onward)         Start     Ordered   08/06/20 0000  Home infusion instructions       Comments: No infusion medication at discharge  Question:  Instructions  Answer:  Flushing of vascular access device: 0.9% NaCl pre/post medication administration and prn patency; Heparin 100 u/ml, 66ml for implanted ports and Heparin 10u/ml, 21ml for all other central venous catheters.   08/06/20 1403           Durable Medical Equipment  (From admission, onward)         Start     Ordered   08/05/20 1159  For home use only DME Nebulizer machine  Once       Question Answer Comment  Patient needs a nebulizer to treat with the following condition Interstitial lung disease (HCC)   Patient needs a nebulizer to treat with the following condition COPD (chronic obstructive  pulmonary disease) (HCC)   Length of Need Lifetime      08/05/20 1200         Total time spent on this discharge encounter is 38 minutes  Signed: Aquilla Hacker 08/06/2020, 2:09 PM

## 2020-08-06 NOTE — TOC Transition Note (Signed)
Transition of Care [facility]) - CM/SW Discharge Note   Patient Details  Name: Kimberly Shelton MRN: [id number] Date of Birth: 1961-08-11  Transition of Care [facility]) CM/SW Contact:  Ida Rogue, LCSW Phone Number: 08/06/2020, 11:07 AM   Clinical Narrative:  Patient who is scheduled for d/c today is in need of O2 travel canister, has a ride home.  Chart indicates that she gets her O2 through [employer], confirmed with Ian Malkin who will arrrange for delivery of travel unit.  Order seen and appreciated.  No further needs identified.  TOC sign off.     Final next level of care: Home/Self Care Barriers to Discharge: No Barriers Identified   Patient Goals and CMS Choice        Discharge Placement                       Discharge Plan and Services                                     Social Determinants of Health (SDOH) Interventions     Readmission Risk Interventions No flowsheet data found.

## 2020-08-06 NOTE — Progress Notes (Signed)
NAME:  Kimberly Shelton, MRN:  [id number], DOB:  08/21/1961, LOS: 2 ADMISSION DATE:  08/04/2020, CONSULTATION DATE:  11/29 REFERRING MD:  Dr. Dairl Ponder, CHIEF COMPLAINT:  ILD   Brief History   59 year old female with history of ILD and new oxygen requirement admitted to [facility] long 11/29 with progressive dyspnea.  History of present illness   59 year old female with past medical history as below, which is significant for interstitial lung disease, UIP, and aspergilloma.  She is followed by Dr. [name].  Multiple treatment modalities have been tried over the course of her disease process, however, she has been frustrated multiple complications/adverse reactions.  She has been unable to tolerate OFEV and Esbriet. Also has had to stop voriconazole due to GI issues.  Most recently she was admitted in October of this year for cholecystitis and underwent cholecystectomy.  At that time she was not on oxygen, but feels her course is continued to worsen since that admission.  She has developed progressive dyspnea and fatigue.  She presented to the pulmonary clinic on 11/16 with time she was noted to have oxygen saturation of 86% and was started on 2 L of oxygen.  She is need to increase this to 3 L in the following week.  On 11/29 she presented to [facility] long emergency department with complaints of progressive dyspnea specifically on exertion.  CT angiogram was done to rule out PE which was negative, however, it did describe some opacifications are concerning for pneumonia.  She was started on empiric antibiotics and admitted to medical floor.  Pulmonary was consulted  Past Medical History   has a past medical history of Abnormal Pap smear (09/2006), CHF (congestive heart failure) (HCC), COPD (chronic obstructive pulmonary disease) (HCC), Foot fracture, left (10/16), History of pneumonia (02/09/2016), Interstitial lung disease (HCC), Pneumonia, and RA (rheumatoid arthritis) (HCC) (07/2009).   Significant Hospital  Events     Consults:    Procedures:    Significant Diagnostic Tests:  CT angiogram 11/29 > No evidence of significant pulmonary embolism. New diffuse severe ground-glass opacities in lower lobes. Differential includes pulmonary edema versus atypical infection including COVID pneumonia. Progressive subpleural cystic change in the RIGHT lower lobe consistent with progressive interstitial lung disease (UIP). Stable severe a cavitary change in the upper lobes with central nodularity.  Micro Data:    Antimicrobials:  Levofloxacin 11/29 >  Interim history/subjective:  Wondering if her breathing is going to get better. Hopeful to get stronger. Says her breathing is fine at rest and is only worse with exertion.  Objective   Blood pressure (!) 129/58, pulse (!) 103, temperature 98.2 F (36.8 C), resp. rate 20, height 5\' 3"  (1.6 m), weight 62.8 kg, last menstrual period 09/06/2004, SpO2 90 %.        Intake/Output Summary (Last 24 hours) at 08/06/2020 0950 Last data filed at 08/06/2020 0300 Gross per 24 hour  Intake 270 ml  Output --  Net 270 ml   Filed Weights   08/04/20 1709  Weight: 62.8 kg    Examination: General: thin female resting comfortably in bed.   HENT: normocephalic Lungs: bilateral coarse crackles.  Cardiovascular: RRR, no MRG Abdomen: Non-distended Extremities: No acute deformity or ROM limitation. Ulnar deviation of fingers.  Neuro: Alert, oriented, non-focal  Resolved Hospital Problem list     Assessment & Plan:   Acute on chronic hypoxemic respiratory failure: known history of ILD followed by Dr. 08/06/20. CT imaging in the ED consistent with progression of known disease, however,  cannot rule out bacterial infection. She is not volume overloaded on exam.  - Supplemental O2 titrated to SpO2 >90% (on 4L now) - continue home prednisone 2.5mg , no role for pulse steroids at this time. Hospital medicine order prednisone which I have discontinued given the  potential for harm.  - Levofloxacin for a 5 day course.  - Unfortunately this is likely a progression of her known disease and up titration of oxygen may be all we have left to offer. - Home nebulizer order placed as she feels they have been beneficial. Plan to discharge on PRN duoneb.  - Has follow-up with Dr. [name] scheduled for 12/9 - Will need ambulatory saturation assessment to determine home O2 flow rate prior to discharge.   Pulmonary aspergilloma: no longer taking voriconazole since admission in October. No hemoptysis, therefore no role for embolectomy at this time.  - Supportive care   No objections to discharge from a pulmonary standpoint.  Durel Salts, MD Pulmonary and Critical Care Medicine [facility] Pager: [phone] Office:[phone]    Labs   CBC: Recent Labs  Lab 08/04/20 1227 08/05/20 0455 08/06/20 0418  WBC 16.7* 13.4* 27.1*  HGB 10.7* 10.5* 10.1*  HCT 35.7* 35.4* 34.0*  MCV 81.0 81.9 81.7  PLT 589* 546* 674*    Basic Metabolic Panel: Recent Labs  Lab 08/04/20 1227 08/05/20 0455 08/06/20 0418  NA 133* 136 137  K 4.5 4.3 3.6  CL 94* 99 97*  CO2 26 25 27   GLUCOSE 106* 155* 127*  BUN 10 10 17   CREATININE 0.56 0.48 0.68  CALCIUM 8.9 9.2 9.1  MG  --   --  2.2   GFR: Estimated Creatinine Clearance: 61.9 mL/min (by C-G formula based on SCr of 0.68 mg/dL). Recent Labs  Lab 08/04/20 1227 08/05/20 0455 08/06/20 0418  WBC 16.7* 13.4* 27.1*    Liver Function Tests: No results for input(s): AST, ALT, ALKPHOS, BILITOT, PROT, ALBUMIN in the last 168 hours. No results for input(s): LIPASE, AMYLASE in the last 168 hours. No results for input(s): AMMONIA in the last 168 hours.  ABG No results found for: PHART, PCO2ART, PO2ART, HCO3, TCO2, ACIDBASEDEF, O2SAT   Coagulation Profile: No results for input(s): INR, PROTIME in the last 168 hours.  Cardiac Enzymes: No results for input(s): CKTOTAL, CKMB, CKMBINDEX, TROPONINI in the  last 168 hours.  HbA1C: No results found for: HGBA1C  CBG: No results for input(s): GLUCAP in the last 168 hours.

## 2020-08-08 ENCOUNTER — Telehealth: Payer: Self-pay | Admitting: Internal Medicine

## 2020-08-08 DIAGNOSIS — J189 Pneumonia, unspecified organism: Secondary | ICD-10-CM

## 2020-08-08 MED ORDER — FLUCONAZOLE 100 MG PO TABS: ORAL_TABLET | ORAL | 0 refills | Status: AC

## 2020-08-08 MED ORDER — ALPRAZOLAM 0.25 MG PO TABS: 0.2500 mg | ORAL_TABLET | Freq: Every day | ORAL | 0 refills | Status: AC | PRN

## 2020-08-08 NOTE — Telephone Encounter (Signed)
Dr. [name], Please advise.  Thank you.

## 2020-08-08 NOTE — Telephone Encounter (Signed)
Spoke with the pt and notified of response per MR  She verbalized understanding  Rxs called to pharm and she will keep f/u 12/9, declined sooner ov  Will report to ED if worsens

## 2020-08-08 NOTE — Telephone Encounter (Signed)
Primary Pulmonologist: Dr. [name] Last office visit and with whom: 07/22/20 MR What do we see them for (pulmonary problems): ILD, pulmonary aspergilloma Last OV assessment/plan:   Hemoptysis - completed bronch 11/27/19 - no new culture results - mild and ongoing and due to below lung issues despite  voricanazole restart on 10/26/2019. On hold since Oct 2021  admission for GI issues - referral to IR for embolization was held up because of admission in October 2021  Plan  -Continue to monitor -If it gets worse go to the ER -We can consider rereferral to interventional radiology but at this point based on the input we will hold off till GI issues are sorted out  Pulmonary aspergilloma (HCC) Bronchiectasis with LRTI Iinfection of aspergilloma  -Currently off voriconazole since hospitalization in October 2021 for GI issues Plan  - per Dr Luciana Axe -   Interstitial lung disease due to connective tissue disease (HCC) - UIP pattern, progresive Encounter for therapeutic monitoring -significant side effects with pirfenidone Nintedanib therapy: Late July 2021 through mid October 2021 [and then held following cholecystitis and upper GI bleed with duodenal ulcers]  -I am concerned interstitial lung disease is worse based on desaturation to 86%  Plan -Hold off nintedanib due to ulcer and bleeding history [it is possible that nintedanib rates the risk of this along with prednisone and emotional stress] -Supportive care at this point - start 2L [place] with exertion  - check ONO on room air - will order   Blood loss anemia from esophageal ulcers and duodenal ulcers - oct 2021  - improved hgb to > 9gm%  Plan  - follow with Dr Loreta Ave  Electrolyte imblaance   - mild low potassium, magnesium and phosphorus on labs today  plan - neutraphos 1 packet per day x 2 weeks  - magnesium oxide 400mg  per day x 2 weeks  - kcl daily x 2 weeks  Rheumatoid arthritis involving multiple  joints (HCC)  -This appears active given the onset of pulmonary nodules and new elbows and also interstitial lung disease in the lung and active joint pain  - Plan -plaquenil oer Dr -Keep your prednisone at 5 mg/day -Rest of treatment per Dr. Dierdre Forth  Financial Difficulties Disabiltuy eval  -Glad you got disability  Plan -No specific intervention   Followup  - 2-6 weeks with Dr Dierdre Forth in 30 min slot  Was appointment offered to patient (explain)?  Was just in hospital Monday    Reason for call: Shortness of breath when she is up moving. Was in hospital for bilateral pneumonia Monday . Sent home. On Levaquin has 3 pills left. Also on oxygen 3L but when up moving she drops to 84% and recovers rather quickly per patient.  Still has a cough, some mucus production clear-yellow. She is not taking anything over the cough bu cough drops.  She denies fevers.  Patient states last time she had pneumonia she had a nurse come to the house and help her. She wants someone to help her get better and be able to breathe. She has her sister who lives next door that could help if given the right direction.   Tammy please advise.    Allergies  Allergen Reactions  . Bee Venom Anaphylaxis  . Benadryl Allergy [Diphenhydramine Hcl]     Went into afib  . Codeine Other (See Comments)    "seeing spots"  . Pirfenidone Other (See Comments)    GI side effects  . Penicillins Rash  Has patient had a PCN reaction causing immediate rash, facial/tongue/throat swelling, SOB or lightheadedness with hypotension: No Has patient had a PCN reaction causing severe rash involving mucus membranes or skin necrosis: No Has patient had a PCN reaction that required hospitalization No Has patient had a PCN reaction occurring within the last 10 years: Yes If all of the above answers are "NO", then may proceed with Cephalosporin use.     Immunization History  Administered Date(s) Administered  .  Influenza Inj Mdck Quad Pf 05/25/2019  . Influenza,inj,Quad PF,6+ Mos 06/25/2015, 05/31/2017, 05/29/2018, 05/16/2020  . Influenza-Unspecified 06/06/2014  . PFIZER SARS-COV-2 Vaccination 11/29/2019, 12/22/2019, 07/11/2020  . Pneumococcal Conjugate-13 08/11/2018  . Pneumococcal Polysaccharide-23 02/18/2014  . Tdap 09/01/2016

## 2020-08-08 NOTE — Telephone Encounter (Signed)
[phone] pt calling back she is panicking wants to talk to the nurse also throat is hurting too

## 2020-08-08 NOTE — Telephone Encounter (Signed)
ATC patient unable to reach LM to call back office (x1)  Order for home health placed

## 2020-08-08 NOTE — Telephone Encounter (Signed)
I understand they are upset but I was off and now in hospital - my apologies and appreciate you explaning that to them. But please tell them I did d/w Dr Celine Mans when she was in service  Plan - let them know   1. Worsening oxygenation - probably reflects worsening fibrosis . If so is hard to cure this  - she can increase her fio2 to max the tank will allow - 4L or 5L -> if still droping < 88% - then if she should consider going back to ER  2. I am seeing her 08/14/20 but if needed sooner she can have an additional visit 08/11/20 Monday with an app  3 Go to ER if any concern. There is very little we can do on phone to help situation with oxygenation  4. Anxiety - send xanax 0.25mg  once a day as needed - 7 pills no refill. I can do imprivata signature if needed   5. for sore throat and suspected thrush - fluconazole 200 mg loading dose, followed by 100 mg daily for 7days    Thanks    SIGNATURE    Dr. Kalman Shan, M.D., F.C.C.P,  Pulmonary and Critical Care Medicine Staff Physician, [facility] Director - Interstitial Lung Disease  Program  Pulmonary Fibrosis [facility] [place], [place], [address]  Pager: [phone], If no answer  OR between  19:00-7:00h: page [phone]  [phone] Telephone (clinical office): [phone] Telephone (research): [phone]  4:02 PM 08/08/2020       Current Outpatient Medications:  .  acetaminophen (TYLENOL) 325 MG tablet, Take 2 tablets (650 mg total) by mouth every 6 (six) hours as needed for mild pain (or Fever >/= 101)., Disp: , Rfl:  .  albuterol (PROVENTIL) (2.5 MG/3ML) 0.083% nebulizer solution, Take 3 mLs (2.5 mg total) by nebulization every 2 (two) hours as needed for shortness of breath., Disp: 75 mL, Rfl: 12 .  albuterol (VENTOLIN HFA) 108 (90 Base) MCG/ACT inhaler, INHALE 2 PUFFS INTO THE LUNGS EVERY 6 HOURS AS NEEDED FOR WHEEZING OR SHORTNESS OF BREATH, Disp: 8.5 g, Rfl: 3 .   benzonatate (TESSALON) 200 MG capsule, Take 200 mg by mouth in the morning, at noon, and at bedtime., Disp: , Rfl:  .  dimethicone (NEUTRAPHOR) 1 % cream, Apply 1 application topically 2 (two) times daily as needed for up to 14 days for dry skin., Disp: 28 g, Rfl: 0 .  docusate sodium (COLACE) 100 MG capsule, Take 1 capsule (100 mg total) by mouth daily as needed., Disp: 30 capsule, Rfl: 2 .  EPINEPHrine (EPIPEN 2-PAK) 0.3 mg/0.3 mL IJ SOAJ injection, Inject 0.3 mg into the muscle as needed for anaphylaxis. , Disp: , Rfl:  .  escitalopram (LEXAPRO) 10 MG tablet, Take 1 tablet (10 mg total) by mouth daily., Disp: 90 tablet, Rfl: 3 .  feeding supplement (ENSURE ENLIVE / ENSURE PLUS) LIQD, Take 237 mLs by mouth 2 (two) times daily between meals for 7 days., Disp: 237 mL, Rfl: 12 .  ferrous sulfate 325 (65 FE) MG EC tablet, Take 1 tablet (325 mg total) by mouth 2 (two) times daily., Disp: 60 tablet, Rfl: 0 .  hydroxychloroquine (PLAQUENIL) 200 MG tablet, Take 1 tablet (200 mg total) by mouth 2 (two) times daily., Disp: 60 tablet, Rfl: 0 .  ipratropium-albuterol (DUONEB) 0.5-2.5 (3) MG/3ML SOLN, Take 3 mLs by nebulization 3 (three) times daily., Disp: 360 mL,  Rfl: 0 .  levofloxacin (LEVAQUIN) 750 MG tablet, Take 1 tablet (750 mg total) by mouth daily for 4 days., Disp: 7 tablet, Rfl: 0 .  Magnesium Oxide 400 MG CAPS, Take 1 capsule (400 mg total) by mouth daily for 14 days., Disp: 14 capsule, Rfl: 0 .  Multiple Vitamin (MULTIVITAMIN WITH MINERALS) TABS tablet, Take 1 tablet by mouth daily., Disp: 30 tablet, Rfl: 0 .  ondansetron (ZOFRAN) 4 MG tablet, Take 1 tablet (4 mg total) by mouth 3 (three) times daily as needed for nausea. (Patient not taking: Reported on 08/04/2020), Disp: 90 tablet, Rfl: 3 .  pantoprazole (PROTONIX) 40 MG tablet, Take 1 tablet (40 mg total) by mouth 2 (two) times daily., Disp: 60 tablet, Rfl: 1 .  polyethylene glycol (MIRALAX / GLYCOLAX) 17 g packet, Take 17 g by mouth daily as  needed for up to 14 days for mild constipation., Disp: 14 each, Rfl: 0 .  potassium chloride (KLOR-CON) 10 MEQ tablet, Take 1 tablet (10 mEq total) by mouth daily., Disp: 30 tablet, Rfl: 0      Allergies  Allergen Reactions  . Bee Venom Anaphylaxis  . Benadryl Allergy [Diphenhydramine Hcl]     Went into afib  . Codeine Other (See Comments)    "seeing spots"  . Pirfenidone Other (See Comments)    GI side effects  . Penicillins Rash    Has patient had a PCN reaction causing immediate rash, facial/tongue/throat swelling, SOB or lightheadedness with hypotension: No Has patient had a PCN reaction causing severe rash involving mucus membranes or skin necrosis: No Has patient had a PCN reaction that required hospitalization No Has patient had a PCN reaction occurring within the last 10 years: Yes If all of the above answers are "NO", then may proceed with Cephalosporin use.

## 2020-08-08 NOTE — Telephone Encounter (Signed)
Called and spoke with patient, she states she has had a sore throat and mouth since using the inhalers and nebulizers.  She also states she is very anxious and wants to know what to do about the anxiety.  She states when she stands up her sats drop to 83%.  Advised her to adjust her oxygen up to keep her sats>88%.  Her oxygen is on 3L when she stands up and it drops to 83%.  Also advised her that it takes time for her body to recover from illness.  Her sister is upset that Dr. [name] has not called and checked up on her.  I advised her that he is working in the hospital.  We will get orders to the home health agency.  Also advised of Tammy's recommendations.  I let her know that I would make Tammy aware of the sorness in her mouth/throat and we would call her back after we have heard back from Tammy.  She verbalized understanding.  Tammy, Please advise regarding sore throat/mouth the last couple days since using nebulizer and inhalers.  She thinks she might have thrush.  She also wants to k now what she can do about the anxiety.  Thank you.

## 2020-08-08 NOTE — Telephone Encounter (Signed)
Just discharged from hospital .  Has ov next week with Dr. [name]  Yes please order home health eval for services  Finish abx as directed.  Adjust oxygen 3l/m rest and 4l/m with moving to help keep Oxygen levels >88%.   Please contact office for sooner follow up if symptoms do not improve or worsen or seek emergency care

## 2020-08-10 ENCOUNTER — Other Ambulatory Visit: Payer: Self-pay

## 2020-08-10 ENCOUNTER — Telehealth: Payer: Self-pay | Admitting: Pulmonary Disease

## 2020-08-10 ENCOUNTER — Emergency Department (HOSPITAL_COMMUNITY): Payer: 59

## 2020-08-10 ENCOUNTER — Inpatient Hospital Stay (HOSPITAL_COMMUNITY)
Admission: EM | Admit: 2020-08-10 | Discharge: 2020-09-06 | DRG: 189 | Disposition: E | Payer: 59 | Attending: Internal Medicine | Admitting: Internal Medicine

## 2020-08-10 ENCOUNTER — Encounter (HOSPITAL_COMMUNITY): Payer: Self-pay

## 2020-08-10 DIAGNOSIS — R0602 Shortness of breath: Secondary | ICD-10-CM | POA: Diagnosis not present

## 2020-08-10 DIAGNOSIS — M0510 Rheumatoid lung disease with rheumatoid arthritis of unspecified site: Secondary | ICD-10-CM | POA: Diagnosis present

## 2020-08-10 DIAGNOSIS — Z825 Family history of asthma and other chronic lower respiratory diseases: Secondary | ICD-10-CM

## 2020-08-10 DIAGNOSIS — Z888 Allergy status to other drugs, medicaments and biological substances status: Secondary | ICD-10-CM

## 2020-08-10 DIAGNOSIS — R531 Weakness: Secondary | ICD-10-CM

## 2020-08-10 DIAGNOSIS — Z87891 Personal history of nicotine dependence: Secondary | ICD-10-CM

## 2020-08-10 DIAGNOSIS — Z885 Allergy status to narcotic agent status: Secondary | ICD-10-CM

## 2020-08-10 DIAGNOSIS — F411 Generalized anxiety disorder: Secondary | ICD-10-CM | POA: Diagnosis present

## 2020-08-10 DIAGNOSIS — J449 Chronic obstructive pulmonary disease, unspecified: Secondary | ICD-10-CM | POA: Diagnosis present

## 2020-08-10 DIAGNOSIS — J9601 Acute respiratory failure with hypoxia: Secondary | ICD-10-CM

## 2020-08-10 DIAGNOSIS — F32A Depression, unspecified: Secondary | ICD-10-CM | POA: Diagnosis present

## 2020-08-10 DIAGNOSIS — J8489 Other specified interstitial pulmonary diseases: Secondary | ICD-10-CM | POA: Diagnosis present

## 2020-08-10 DIAGNOSIS — Z66 Do not resuscitate: Secondary | ICD-10-CM | POA: Diagnosis present

## 2020-08-10 DIAGNOSIS — M359 Systemic involvement of connective tissue, unspecified: Secondary | ICD-10-CM | POA: Diagnosis present

## 2020-08-10 DIAGNOSIS — E876 Hypokalemia: Secondary | ICD-10-CM | POA: Diagnosis present

## 2020-08-10 DIAGNOSIS — J440 Chronic obstructive pulmonary disease with acute lower respiratory infection: Secondary | ICD-10-CM | POA: Diagnosis present

## 2020-08-10 DIAGNOSIS — D75839 Thrombocytosis, unspecified: Secondary | ICD-10-CM | POA: Diagnosis present

## 2020-08-10 DIAGNOSIS — J9621 Acute and chronic respiratory failure with hypoxia: Principal | ICD-10-CM | POA: Diagnosis present

## 2020-08-10 DIAGNOSIS — Z880 Allergy status to penicillin: Secondary | ICD-10-CM

## 2020-08-10 DIAGNOSIS — Z9981 Dependence on supplemental oxygen: Secondary | ICD-10-CM

## 2020-08-10 DIAGNOSIS — Z515 Encounter for palliative care: Secondary | ICD-10-CM

## 2020-08-10 DIAGNOSIS — B449 Aspergillosis, unspecified: Secondary | ICD-10-CM | POA: Diagnosis present

## 2020-08-10 DIAGNOSIS — Z8701 Personal history of pneumonia (recurrent): Secondary | ICD-10-CM

## 2020-08-10 DIAGNOSIS — K269 Duodenal ulcer, unspecified as acute or chronic, without hemorrhage or perforation: Secondary | ICD-10-CM | POA: Diagnosis present

## 2020-08-10 DIAGNOSIS — R000 Tachycardia, unspecified: Secondary | ICD-10-CM | POA: Diagnosis present

## 2020-08-10 DIAGNOSIS — J84112 Idiopathic pulmonary fibrosis: Secondary | ICD-10-CM | POA: Diagnosis present

## 2020-08-10 DIAGNOSIS — K219 Gastro-esophageal reflux disease without esophagitis: Secondary | ICD-10-CM | POA: Diagnosis present

## 2020-08-10 DIAGNOSIS — I5033 Acute on chronic diastolic (congestive) heart failure: Secondary | ICD-10-CM | POA: Diagnosis present

## 2020-08-10 DIAGNOSIS — Z91030 Bee allergy status: Secondary | ICD-10-CM

## 2020-08-10 DIAGNOSIS — D649 Anemia, unspecified: Secondary | ICD-10-CM | POA: Diagnosis present

## 2020-08-10 DIAGNOSIS — Z20822 Contact with and (suspected) exposure to covid-19: Secondary | ICD-10-CM | POA: Diagnosis present

## 2020-08-10 DIAGNOSIS — T380X5A Adverse effect of glucocorticoids and synthetic analogues, initial encounter: Secondary | ICD-10-CM | POA: Diagnosis present

## 2020-08-10 DIAGNOSIS — M069 Rheumatoid arthritis, unspecified: Secondary | ICD-10-CM | POA: Diagnosis present

## 2020-08-10 DIAGNOSIS — Z86001 Personal history of in-situ neoplasm of cervix uteri: Secondary | ICD-10-CM

## 2020-08-10 DIAGNOSIS — D72829 Elevated white blood cell count, unspecified: Secondary | ICD-10-CM | POA: Diagnosis present

## 2020-08-10 DIAGNOSIS — Z7189 Other specified counseling: Secondary | ICD-10-CM

## 2020-08-10 DIAGNOSIS — D638 Anemia in other chronic diseases classified elsewhere: Secondary | ICD-10-CM | POA: Diagnosis present

## 2020-08-10 DIAGNOSIS — Z79899 Other long term (current) drug therapy: Secondary | ICD-10-CM

## 2020-08-10 LAB — CBC WITH DIFFERENTIAL/PLATELET
Abs Immature Granulocytes: 0.43 10*3/uL — ABNORMAL HIGH (ref 0.00–0.07)
Basophils Absolute: 0.1 10*3/uL (ref 0.0–0.1)
Basophils Relative: 0 %
Eosinophils Absolute: 0.9 10*3/uL — ABNORMAL HIGH (ref 0.0–0.5)
Eosinophils Relative: 4 %
HCT: 32.6 % — ABNORMAL LOW (ref 36.0–46.0)
Hemoglobin: 9.5 g/dL — ABNORMAL LOW (ref 12.0–15.0)
Immature Granulocytes: 2 %
Lymphocytes Relative: 6 %
Lymphs Abs: 1.4 10*3/uL (ref 0.7–4.0)
MCH: 23.4 pg — ABNORMAL LOW (ref 26.0–34.0)
MCHC: 29.1 g/dL — ABNORMAL LOW (ref 30.0–36.0)
MCV: 80.3 fL (ref 80.0–100.0)
Monocytes Absolute: 1 10*3/uL (ref 0.1–1.0)
Monocytes Relative: 4 %
Neutro Abs: 18.4 10*3/uL — ABNORMAL HIGH (ref 1.7–7.7)
Neutrophils Relative %: 84 %
Platelets: 489 10*3/uL — ABNORMAL HIGH (ref 150–400)
RBC: 4.06 MIL/uL (ref 3.87–5.11)
RDW: 17.9 % — ABNORMAL HIGH (ref 11.5–15.5)
WBC: 22.1 10*3/uL — ABNORMAL HIGH (ref 4.0–10.5)
nRBC: 0 % (ref 0.0–0.2)

## 2020-08-10 LAB — BLOOD GAS, ARTERIAL
Acid-base deficit: 0.9 mmol/L (ref 0.0–2.0)
Bicarbonate: 23.2 mmol/L (ref 20.0–28.0)
Drawn by: 225631
FIO2: 100
O2 Content: 15 L/min
O2 Saturation: 93.8 %
Patient temperature: 98.6
pCO2 arterial: 38.3 mmHg (ref 32.0–48.0)
pH, Arterial: 7.399 (ref 7.350–7.450)
pO2, Arterial: 76.9 mmHg — ABNORMAL LOW (ref 83.0–108.0)

## 2020-08-10 NOTE — ED Triage Notes (Signed)
Brought in by EMS SOB oxygen level 80% on 4L

## 2020-08-10 NOTE — Telephone Encounter (Signed)
Contacted by home health nurse. She reported anxiety, mild HR elevation, O2 saturation in acceptable range.  Advised 0.25mg  xanax q12h prn.  Spoke with RN after administering xanax and she reported good effect.

## 2020-08-11 ENCOUNTER — Telehealth: Payer: Self-pay | Admitting: Internal Medicine

## 2020-08-11 ENCOUNTER — Encounter (HOSPITAL_COMMUNITY): Payer: Self-pay | Admitting: Family Medicine

## 2020-08-11 DIAGNOSIS — J849 Interstitial pulmonary disease, unspecified: Secondary | ICD-10-CM | POA: Diagnosis not present

## 2020-08-11 DIAGNOSIS — Z825 Family history of asthma and other chronic lower respiratory diseases: Secondary | ICD-10-CM | POA: Diagnosis not present

## 2020-08-11 DIAGNOSIS — D72829 Elevated white blood cell count, unspecified: Secondary | ICD-10-CM | POA: Diagnosis present

## 2020-08-11 DIAGNOSIS — Z515 Encounter for palliative care: Secondary | ICD-10-CM | POA: Diagnosis not present

## 2020-08-11 DIAGNOSIS — D84821 Immunodeficiency due to drugs: Secondary | ICD-10-CM

## 2020-08-11 DIAGNOSIS — R531 Weakness: Secondary | ICD-10-CM | POA: Diagnosis not present

## 2020-08-11 DIAGNOSIS — K219 Gastro-esophageal reflux disease without esophagitis: Secondary | ICD-10-CM | POA: Diagnosis present

## 2020-08-11 DIAGNOSIS — R0602 Shortness of breath: Secondary | ICD-10-CM | POA: Diagnosis present

## 2020-08-11 DIAGNOSIS — T380X5A Adverse effect of glucocorticoids and synthetic analogues, initial encounter: Secondary | ICD-10-CM | POA: Diagnosis present

## 2020-08-11 DIAGNOSIS — D649 Anemia, unspecified: Secondary | ICD-10-CM | POA: Diagnosis not present

## 2020-08-11 DIAGNOSIS — Z7189 Other specified counseling: Secondary | ICD-10-CM | POA: Diagnosis not present

## 2020-08-11 DIAGNOSIS — Z20822 Contact with and (suspected) exposure to covid-19: Secondary | ICD-10-CM | POA: Diagnosis present

## 2020-08-11 DIAGNOSIS — J449 Chronic obstructive pulmonary disease, unspecified: Secondary | ICD-10-CM | POA: Diagnosis not present

## 2020-08-11 DIAGNOSIS — D75839 Thrombocytosis, unspecified: Secondary | ICD-10-CM | POA: Diagnosis present

## 2020-08-11 DIAGNOSIS — J8489 Other specified interstitial pulmonary diseases: Secondary | ICD-10-CM | POA: Diagnosis not present

## 2020-08-11 DIAGNOSIS — J84112 Idiopathic pulmonary fibrosis: Secondary | ICD-10-CM | POA: Diagnosis present

## 2020-08-11 DIAGNOSIS — D638 Anemia in other chronic diseases classified elsewhere: Secondary | ICD-10-CM | POA: Diagnosis present

## 2020-08-11 DIAGNOSIS — Z7952 Long term (current) use of systemic steroids: Secondary | ICD-10-CM

## 2020-08-11 DIAGNOSIS — F32A Depression, unspecified: Secondary | ICD-10-CM | POA: Diagnosis present

## 2020-08-11 DIAGNOSIS — Z66 Do not resuscitate: Secondary | ICD-10-CM | POA: Diagnosis present

## 2020-08-11 DIAGNOSIS — J9621 Acute and chronic respiratory failure with hypoxia: Principal | ICD-10-CM

## 2020-08-11 DIAGNOSIS — R000 Tachycardia, unspecified: Secondary | ICD-10-CM | POA: Diagnosis present

## 2020-08-11 DIAGNOSIS — F411 Generalized anxiety disorder: Secondary | ICD-10-CM | POA: Diagnosis present

## 2020-08-11 DIAGNOSIS — M0510 Rheumatoid lung disease with rheumatoid arthritis of unspecified site: Secondary | ICD-10-CM | POA: Diagnosis present

## 2020-08-11 DIAGNOSIS — M069 Rheumatoid arthritis, unspecified: Secondary | ICD-10-CM

## 2020-08-11 DIAGNOSIS — Z86001 Personal history of in-situ neoplasm of cervix uteri: Secondary | ICD-10-CM | POA: Diagnosis not present

## 2020-08-11 DIAGNOSIS — Z87891 Personal history of nicotine dependence: Secondary | ICD-10-CM | POA: Diagnosis not present

## 2020-08-11 DIAGNOSIS — Z8701 Personal history of pneumonia (recurrent): Secondary | ICD-10-CM | POA: Diagnosis not present

## 2020-08-11 DIAGNOSIS — K269 Duodenal ulcer, unspecified as acute or chronic, without hemorrhage or perforation: Secondary | ICD-10-CM

## 2020-08-11 DIAGNOSIS — B449 Aspergillosis, unspecified: Secondary | ICD-10-CM | POA: Diagnosis present

## 2020-08-11 DIAGNOSIS — I5033 Acute on chronic diastolic (congestive) heart failure: Secondary | ICD-10-CM

## 2020-08-11 DIAGNOSIS — Z9981 Dependence on supplemental oxygen: Secondary | ICD-10-CM | POA: Diagnosis not present

## 2020-08-11 DIAGNOSIS — J440 Chronic obstructive pulmonary disease with acute lower respiratory infection: Secondary | ICD-10-CM | POA: Diagnosis present

## 2020-08-11 DIAGNOSIS — E876 Hypokalemia: Secondary | ICD-10-CM | POA: Diagnosis present

## 2020-08-11 DIAGNOSIS — J431 Panlobular emphysema: Secondary | ICD-10-CM | POA: Diagnosis not present

## 2020-08-11 DIAGNOSIS — M359 Systemic involvement of connective tissue, unspecified: Secondary | ICD-10-CM | POA: Diagnosis not present

## 2020-08-11 LAB — BASIC METABOLIC PANEL
Anion gap: 13 (ref 5–15)
BUN: 12 mg/dL (ref 6–20)
CO2: 28 mmol/L (ref 22–32)
Calcium: 8.5 mg/dL — ABNORMAL LOW (ref 8.9–10.3)
Chloride: 95 mmol/L — ABNORMAL LOW (ref 98–111)
Creatinine, Ser: 0.64 mg/dL (ref 0.44–1.00)
GFR, Estimated: >60 mL/min (ref >60)
Glucose, Bld: 171 mg/dL — ABNORMAL HIGH (ref 70–99)
Potassium: 3.9 mmol/L (ref 3.5–5.1)
Sodium: 136 mmol/L (ref 135–145)

## 2020-08-11 LAB — MAGNESIUM: Magnesium: 2.6 mg/dL — ABNORMAL HIGH (ref 1.7–2.4)

## 2020-08-11 LAB — PROCALCITONIN: Procalcitonin: 0.26 ng/mL

## 2020-08-11 LAB — CBC
HCT: 32.1 % — ABNORMAL LOW (ref 36.0–46.0)
Hemoglobin: 9.6 g/dL — ABNORMAL LOW (ref 12.0–15.0)
MCH: 23.9 pg — ABNORMAL LOW (ref 26.0–34.0)
MCHC: 29.9 g/dL — ABNORMAL LOW (ref 30.0–36.0)
MCV: 80 fL (ref 80.0–100.0)
Platelets: 415 10*3/uL — ABNORMAL HIGH (ref 150–400)
RBC: 4.01 MIL/uL (ref 3.87–5.11)
RDW: 17.8 % — ABNORMAL HIGH (ref 11.5–15.5)
WBC: 19.9 10*3/uL — ABNORMAL HIGH (ref 4.0–10.5)
nRBC: 0 % (ref 0.0–0.2)

## 2020-08-11 LAB — COMPREHENSIVE METABOLIC PANEL
ALT: 13 U/L (ref 0–44)
AST: 20 U/L (ref 15–41)
Albumin: 2.3 g/dL — ABNORMAL LOW (ref 3.5–5.0)
Alkaline Phosphatase: 158 U/L — ABNORMAL HIGH (ref 38–126)
Anion gap: 14 (ref 5–15)
BUN: 10 mg/dL (ref 6–20)
CO2: 22 mmol/L (ref 22–32)
Calcium: 7.9 mg/dL — ABNORMAL LOW (ref 8.9–10.3)
Chloride: 98 mmol/L (ref 98–111)
Creatinine, Ser: 0.57 mg/dL (ref 0.44–1.00)
GFR, Estimated: >60 mL/min (ref >60)
Glucose, Bld: 139 mg/dL — ABNORMAL HIGH (ref 70–99)
Potassium: 3.7 mmol/L (ref 3.5–5.1)
Sodium: 134 mmol/L — ABNORMAL LOW (ref 135–145)
Total Bilirubin: 0.5 mg/dL (ref 0.3–1.2)
Total Protein: 6.4 g/dL — ABNORMAL LOW (ref 6.5–8.1)

## 2020-08-11 LAB — BRAIN NATRIURETIC PEPTIDE: B Natriuretic Peptide: 148.7 pg/mL — ABNORMAL HIGH (ref 0.0–100.0)

## 2020-08-11 LAB — RESP PANEL BY RT-PCR (FLU A&B, COVID) ARPGX2
Influenza A by PCR: NEGATIVE
Influenza B by PCR: NEGATIVE
SARS Coronavirus 2 by RT PCR: NEGATIVE

## 2020-08-11 MED ORDER — SODIUM CHLORIDE 0.9% FLUSH: 3.0000 mL | Freq: Two times a day (BID) | INTRAVENOUS | Status: DC

## 2020-08-11 MED ORDER — SODIUM CHLORIDE 0.9% FLUSH: 3.0000 mL | INTRAVENOUS | Status: DC | PRN

## 2020-08-11 MED ORDER — FLUCONAZOLE 100 MG PO TABS
100.0000 mg | ORAL_TABLET | Freq: Every day | ORAL | Status: AC
  Administered 2020-08-11 – 2020-08-15 (×5): 100 mg via ORAL
  Filled 2020-08-11 (×5): qty 1

## 2020-08-11 MED ORDER — SENNOSIDES-DOCUSATE SODIUM 8.6-50 MG PO TABS
1.0000 | ORAL_TABLET | Freq: Every evening | ORAL | Status: DC | PRN
  Administered 2020-08-14: 1 via ORAL
  Filled 2020-08-11: qty 1

## 2020-08-11 MED ORDER — ACETAMINOPHEN 325 MG PO TABS
650.0000 mg | ORAL_TABLET | Freq: Four times a day (QID) | ORAL | Status: DC | PRN
  Administered 2020-08-11: 650 mg via ORAL
  Filled 2020-08-11: qty 2

## 2020-08-11 MED ORDER — VANCOMYCIN HCL 750 MG/150ML IV SOLN
750.0000 mg | Freq: Two times a day (BID) | INTRAVENOUS | Status: DC
  Filled 2020-08-11: qty 150

## 2020-08-11 MED ORDER — ONDANSETRON HCL 4 MG PO TABS: 4.0000 mg | ORAL_TABLET | Freq: Four times a day (QID) | ORAL | Status: DC | PRN

## 2020-08-11 MED ORDER — ONDANSETRON HCL 4 MG/2ML IJ SOLN
4.0000 mg | Freq: Four times a day (QID) | INTRAMUSCULAR | Status: DC | PRN
  Administered 2020-08-16: 4 mg via INTRAVENOUS
  Filled 2020-08-11: qty 2

## 2020-08-11 MED ORDER — ACETAMINOPHEN 650 MG RE SUPP: 650.0000 mg | Freq: Four times a day (QID) | RECTAL | Status: DC | PRN

## 2020-08-11 MED ORDER — ALPRAZOLAM 0.25 MG PO TABS
0.2500 mg | ORAL_TABLET | Freq: Three times a day (TID) | ORAL | Status: DC | PRN
  Administered 2020-08-11 – 2020-08-12 (×2): 0.25 mg via ORAL
  Filled 2020-08-11 (×2): qty 1

## 2020-08-11 MED ORDER — FUROSEMIDE 10 MG/ML IJ SOLN
40.0000 mg | Freq: Once | INTRAMUSCULAR | Status: AC
  Administered 2020-08-11: 40 mg via INTRAVENOUS
  Filled 2020-08-11: qty 4

## 2020-08-11 MED ORDER — SODIUM CHLORIDE 0.9 % IV SOLN
2.0000 g | Freq: Three times a day (TID) | INTRAVENOUS | Status: AC
  Administered 2020-08-11 – 2020-08-18 (×21): 2 g via INTRAVENOUS
  Filled 2020-08-11 (×21): qty 2

## 2020-08-11 MED ORDER — ALBUTEROL SULFATE (2.5 MG/3ML) 0.083% IN NEBU: 2.5000 mg | INHALATION_SOLUTION | RESPIRATORY_TRACT | Status: DC | PRN

## 2020-08-11 MED ORDER — SODIUM CHLORIDE 0.9% FLUSH
3.0000 mL | Freq: Two times a day (BID) | INTRAVENOUS | Status: DC
  Administered 2020-08-11 (×2): 3 mL via INTRAVENOUS

## 2020-08-11 MED ORDER — ALPRAZOLAM 0.25 MG PO TABS: 0.2500 mg | ORAL_TABLET | Freq: Two times a day (BID) | ORAL | Status: DC | PRN

## 2020-08-11 MED ORDER — ENOXAPARIN SODIUM 40 MG/0.4ML ~~LOC~~ SOLN
40.0000 mg | SUBCUTANEOUS | Status: DC
  Administered 2020-08-11 – 2020-08-16 (×6): 40 mg via SUBCUTANEOUS
  Filled 2020-08-11 (×6): qty 0.4

## 2020-08-11 MED ORDER — FUROSEMIDE 10 MG/ML IJ SOLN
20.0000 mg | Freq: Two times a day (BID) | INTRAMUSCULAR | Status: DC
  Administered 2020-08-11 – 2020-08-12 (×3): 20 mg via INTRAVENOUS
  Filled 2020-08-11 (×2): qty 4
  Filled 2020-08-11: qty 2

## 2020-08-11 MED ORDER — HYDROXYCHLOROQUINE SULFATE 200 MG PO TABS
200.0000 mg | ORAL_TABLET | Freq: Two times a day (BID) | ORAL | Status: DC
  Administered 2020-08-11 – 2020-08-17 (×13): 200 mg via ORAL
  Filled 2020-08-11 (×13): qty 1

## 2020-08-11 MED ORDER — VANCOMYCIN HCL 750 MG/150ML IV SOLN
750.0000 mg | Freq: Two times a day (BID) | INTRAVENOUS | Status: DC
  Administered 2020-08-12: 750 mg via INTRAVENOUS
  Filled 2020-08-11 (×3): qty 150

## 2020-08-11 MED ORDER — IPRATROPIUM-ALBUTEROL 0.5-2.5 (3) MG/3ML IN SOLN
3.0000 mL | Freq: Three times a day (TID) | RESPIRATORY_TRACT | Status: DC
  Administered 2020-08-11 – 2020-08-16 (×17): 3 mL via RESPIRATORY_TRACT
  Filled 2020-08-11 (×18): qty 3

## 2020-08-11 MED ORDER — PANTOPRAZOLE SODIUM 40 MG PO TBEC
40.0000 mg | DELAYED_RELEASE_TABLET | Freq: Two times a day (BID) | ORAL | Status: DC
  Administered 2020-08-11 – 2020-08-17 (×12): 40 mg via ORAL
  Filled 2020-08-11 (×13): qty 1

## 2020-08-11 MED ORDER — VANCOMYCIN HCL 1250 MG/250ML IV SOLN
1250.0000 mg | Freq: Once | INTRAVENOUS | Status: AC
  Administered 2020-08-11: 1250 mg via INTRAVENOUS
  Filled 2020-08-11: qty 250

## 2020-08-11 MED ORDER — SODIUM CHLORIDE 0.9 % IV SOLN: 250.0000 mL | INTRAVENOUS | Status: DC | PRN

## 2020-08-11 MED ORDER — METHYLPREDNISOLONE SODIUM SUCC 125 MG IJ SOLR
125.0000 mg | Freq: Four times a day (QID) | INTRAMUSCULAR | Status: DC
  Administered 2020-08-11 – 2020-08-18 (×28): 125 mg via INTRAVENOUS
  Filled 2020-08-11 (×28): qty 2

## 2020-08-11 NOTE — Progress Notes (Signed)
PT WOULD BENEFIT FROM A MAINTENANCE RESP. DRUG.

## 2020-08-11 NOTE — Progress Notes (Signed)
Pharmacy Antibiotic Note  Kimberly Shelton is a 59 y.o. female with hx COPD, ILD and aspergilloma and strep pneumo pneumonia recently hospitalized for COPD exacerbation and suspected PNA.  She was discharged on 12/01 with a prescription for levaquin. She presented to the ED on 2020-08-13 with c/o SOB.  Pharmacy has been consulted to dose vancomycin and cefepime for suspected PNA.  12/5 CXR: Multifocal pneumonia versus edema, not appreciably changed since prior study.  Plan: - cefepime 2gm IV q8h - vancomycin 1250 mg IV x1, then 750 mg IV q12h  __________________________________  Height: 5\' 3"  (160 cm) Weight: 62.9 kg (138 lb 10.4 oz) IBW/kg (Calculated) : 52.4  Temp (24hrs), Avg:98.5 F (36.9 C), Min:97.6 F (36.4 C), Max:99.4 F (37.4 C)  Recent Labs  Lab 08/04/20 1227 08/05/20 0455 08/06/20 0418 Aug 13, 2020 2329 08/11/20 0734  WBC 16.7* 13.4* 27.1* 22.1* 19.9*  CREATININE 0.56 0.48 0.68 0.57 0.64    Estimated Creatinine Clearance: 66.8 mL/min (by C-G formula based on SCr of 0.64 mg/dL).    Allergies  Allergen Reactions  . Bee Venom Anaphylaxis  . Benadryl Allergy [Diphenhydramine Hcl]     Went into afib  . Codeine Other (See Comments)    "seeing spots"  . Pirfenidone Other (See Comments)    GI side effects  . Penicillins Rash    Has patient had a PCN reaction causing immediate rash, facial/tongue/throat swelling, SOB or lightheadedness with hypotension: No Has patient had a PCN reaction causing severe rash involving mucus membranes or skin necrosis: No Has patient had a PCN reaction that required hospitalization No Has patient had a PCN reaction occurring within the last 10 years: Yes If all of the above answers are "NO", then may proceed with Cephalosporin use.      Thank you for allowing pharmacy to be a part of this patient's care.  14/06/21 08/11/2020 10:15 AM

## 2020-08-11 NOTE — Consult Note (Signed)
NAME:  Kimberly Shelton, MRN:  [id number], DOB:  July 23, 1961, LOS: 0 ADMISSION DATE:  09/02/2020, CONSULTATION DATE:  12/6  REFERRING MD:  Kimberly Shelton, CHIEF COMPLAINT:  hypoxia  Brief History   RA on immunosuppression, UIP from RA, intolerant to antifibrotics New hypoxic respiratory failure in the past few weeks  History of present illness   Kimberly Shelton is a 59 year old woman with a history of known UIP, recently started on supplemental oxygen as an outpatient about 2 weeks ago.  She has had progressive shortness of breath.  She was discharged home on levofloxacin last week but had progressive shortness of breath.  She was given Kimberly Shelton for anxiety, but had ongoing progressive shortness of breath.  She represented to the hospital with saturations in the 70s on her home 4 L.  During her recent admission she was not given steroids due to concern for duodenal ulcers discovered in the past few months.  She has been intolerant to antifibrotic therapy as an outpatient. She denies symptoms of an acute viral illness. She feels fine other than SOB.  She has remote history of cavitary pneumococcal pneumonia and subsequently developed an aspergilloma.  She was treated with long-term voriconazole with resolution.  No recent hemoptysis.  She was diagnosed with UIP due to RA in 2018.  Until about 6 weeks ago when she was admitted for acute cholecystitis she had no limiting shortness of breath.  Past Medical History  Rheumatoid arthritis UIP Aspergilloma   Significant Hospital Events     Consults:  PCCM  Procedures:    Significant Diagnostic Tests:  CXR> worsening bilateral infiltrates, lower lobe predominant  Micro Data:  PCT 0.26  Antimicrobials:  vanc 12/6> Cefepime 12/6>  Interim history/subjective:    Objective   Blood pressure (!) 115/47, pulse (!) 101, temperature 97.6 F (36.4 C), resp. rate (!) 37, height  (1.6 m), weight 62.9 kg, last menstrual period 09/06/2004, SpO2 93 %.        No intake or output data in the 24 hours ending 08/11/20 1722 Filed Weights   08/07/2020 2318  Weight: 62.9 kg    Examination: General: chronically ill appearing woman sitting up in bed in NAD HENT: [place]/AT, eyes anicteric Lungs: tachypneic, rhales bilaterally Cardiovascular: tachycardic, regular rhythm, no murmur Abdomen: soft, NT, ND Extremities: no cyanosis or edema Neuro: awake and alert, answering questions appropriately, moving all extremities Derm: no rashes  Resolved Hospital Problem list     Assessment & Plan:  Acute on chronic hypoxic respiratory failure, most likely due to flare of UIP.  -Trial of high-dose steroids and antibiotics.  Needs high-dose PPI with high-dose steroids. -Collect respiratory culture if possible -Flare of UIP carries a poor prognosis, which we discussed. Her AE is most likely precipitated by discontinuation of antifibrotic therapy due to intolerance.  We discussed the high risk of mortality during this hospital stay and over the next few months.  Recommend against intubation and mechanical ventilation if she has deteriorating respiratory status given the poor prognosis associated with this disease.  Mechanical ventilation would most likely prolong suffering.  I discussed this with her, as has her primary pulmonologist this afternoon Dr. Marchelle Shelton.  Patient agrees to DNR.  I updated the daughter over the phone with the patient. -Kimberly Shelton as needed for anxiety Recommend palliative care consult  Chronic anemia --Transfuse for hemoglobin less than 7 or hemodynamically significant bleeding   Best practice (evaluated daily)   Per primary  Labs   CBC: Recent Labs  Lab 08/05/20 0455 08/06/20 0418 09/04/2020 2329 08/11/20 0734  WBC 13.4* 27.1* 22.1* 19.9*  NEUTROABS  --   --  18.4*  --   HGB 10.5* 10.1* 9.5* 9.6*  HCT 35.4* 34.0* 32.6* 32.1*  MCV 81.9 81.7 80.3 80.0  PLT 546* 674* 489* 415*    Basic Metabolic Panel: Recent Labs  Lab  08/05/20 0455 08/06/20 0418 08/30/2020 2329 08/11/20 0734  NA 136 137 134* 136  K 4.3 3.6 3.7 3.9  CL 99 97* 98 95*  CO2 25 27 22 28   GLUCOSE 155* 127* 139* 171*  BUN 10 17 10 12   CREATININE 0.48 0.68 0.57 0.64  CALCIUM 9.2 9.1 7.9* 8.5*  MG  --  2.2  --  2.6*   GFR: Estimated Creatinine Clearance: 66.8 mL/min (by C-G formula based on SCr of 0.64 mg/dL). Recent Labs  Lab 08/05/20 0455 08/06/20 0418 08/11/2020 2329 08/11/20 0734  PROCALCITON  --   --   --  0.26  WBC 13.4* 27.1* 22.1* 19.9*    Liver Function Tests: Recent Labs  Lab 08/11/2020 2329  AST 20  ALT 13  ALKPHOS 158*  BILITOT 0.5  PROT 6.4*  ALBUMIN 2.3*   No results for input(s): LIPASE, AMYLASE in the last 168 hours. No results for input(s): AMMONIA in the last 168 hours.  ABG    Component Value Date/Time   PHART 7.399 08/30/2020 2329   PCO2ART 38.3 08/28/2020 2329   PO2ART 76.9 (L) 08/19/2020 2329   HCO3 23.2 08/11/2020 2329   ACIDBASEDEF 0.9 08/26/2020 2329   O2SAT 93.8 08/15/2020 2329     Coagulation Profile: No results for input(s): INR, PROTIME in the last 168 hours.  Cardiac Enzymes: No results for input(s): CKTOTAL, CKMB, CKMBINDEX, TROPONINI in the last 168 hours.  HbA1C: No results found for: HGBA1C  CBG: No results for input(s): GLUCAP in the last 168 hours.  Review of Systems:   Review of Systems  Constitutional: Negative for chills and fever.  HENT: Negative for congestion.   Respiratory: Positive for shortness of breath. Negative for wheezing.   Cardiovascular: Negative for chest pain and leg swelling.  Gastrointestinal: Negative for nausea and vomiting.  Genitourinary: Negative.   Musculoskeletal: Negative for neck pain.  Skin: Negative for rash.     Past Medical History  She,  has a past medical history of Abnormal Pap smear (09/2006), CHF (congestive heart failure) (HCC), COPD (chronic obstructive pulmonary disease) (HCC), Foot fracture, left (10/16), History of  pneumonia (02/09/2016), Interstitial lung disease (HCC), Pneumonia, and RA (rheumatoid arthritis) (HCC) (07/2009).   Surgical History    Past Surgical History:  Procedure Laterality Date  . BIOPSY  06/15/2020   Procedure: BIOPSY;  Surgeon: 08/2009, MD;  Location: [facility];  Service: Gastroenterology;;  . BRONCHIAL WASHINGS  11/27/2019   Procedure: BRONCHIAL WASHINGS;  Surgeon: Benancio Deeds, MD;  Location: [facility];  Service: Cardiopulmonary;;  . CERVICAL BIOPSY  W/ LOOP ELECTRODE EXCISION  2008   CIN 3  . CERVICAL CONIZATION W/BX N/A 12/15/2015   Procedure: CONIZATION CERVIX WITH BIOPSY ;  Surgeon: 2009, MD;  Location: [facility];  Service: Gynecology;  Laterality: N/A;  POSSIBLE COLD KNIFE CONE.  Please have the microscope in the room  . CHOLECYSTECTOMY N/A 06/10/2020   Procedure: LAPAROSCOPIC CHOLECYSTECTOMY;  Surgeon: Jerene Bears, MD;  Location: [facility];  Service: General;  Laterality: N/A;  . COLONOSCOPY    . ESOPHAGOGASTRODUODENOSCOPY (EGD) WITH PROPOFOL N/A 06/15/2020   Procedure:  ESOPHAGOGASTRODUODENOSCOPY (EGD) WITH PROPOFOL;  Surgeon: Benancio Deeds, MD;  Location: [facility];  Service: Gastroenterology;  Laterality: N/A;  . IR RADIOLOGIST EVAL & MGMT  05/27/2020  . LEEP N/A 12/15/2015   Procedure: LOOP ELECTROSURGICAL EXCISION PROCEDURE (LEEP) with colpo;  Surgeon: Jerene Bears, MD;  Location: [facility];  Service: Gynecology;  Laterality: N/A;  . PILONIDAL CYST EXCISION  1999   I&D  . VIDEO BRONCHOSCOPY N/A 11/27/2019   Procedure: VIDEO BRONCHOSCOPY WITHOUT FLUORO;  Surgeon: Kalman Shan, MD;  Location: [facility];  Service: Cardiopulmonary;  Laterality: N/A;  . WISDOM TOOTH EXTRACTION       Social History   reports that she quit smoking about 6 years ago. Her smoking use included cigarettes and e-cigarettes. She started smoking about 36 years ago. She has a 30.00 pack-year smoking history. She has never used smokeless tobacco. She  reports that she does not drink alcohol and does not use drugs.   Family History   Her family history includes Breast cancer (age of onset: 65) in her maternal aunt; Bronchitis in her father; COPD in her father; Cancer in her brother; Diabetes in her father and mother; Rheum arthritis in her mother. There is no history of CAD.   Allergies Allergies  Allergen Reactions  . Bee Venom Anaphylaxis  . Benadryl Allergy [Diphenhydramine Hcl]     Went into afib  . Codeine Other (See Comments)    "seeing spots"  . Pirfenidone Other (See Comments)    GI side effects  . Penicillins Rash    Has patient had a PCN reaction causing immediate rash, facial/tongue/throat swelling, SOB or lightheadedness with hypotension: No Has patient had a PCN reaction causing severe rash involving mucus membranes or skin necrosis: No Has patient had a PCN reaction that required hospitalization No Has patient had a PCN reaction occurring within the last 10 years: Yes If all of the above answers are "NO", then may proceed with Cephalosporin use.      Home Medications  Prior to Admission medications   Medication Sig Start Date End Date Taking? Authorizing Provider  acetaminophen (TYLENOL) 325 MG tablet Take 2 tablets (650 mg total) by mouth every 6 (six) hours as needed for mild pain (or Fever >/= 101). 06/12/20  Yes Trixie Deis R, PA-C  albuterol (PROVENTIL) (2.5 MG/3ML) 0.083% nebulizer solution Take 3 mLs (2.5 mg total) by nebulization every 2 (two) hours as needed for shortness of breath. 08/06/20  Yes Aquilla Hacker, MD  albuterol (VENTOLIN HFA) 108 (90 Base) MCG/ACT inhaler INHALE 2 PUFFS INTO THE LUNGS EVERY 6 HOURS AS NEEDED FOR WHEEZING OR SHORTNESS OF BREATH 08/06/20  Yes Aquilla Hacker, MD  ALPRAZolam Prudy Feeler) 0.25 MG tablet Take 1 tablet (0.25 mg total) by mouth daily as needed for anxiety. 08/08/20  Yes Kalman Shan, MD  benzonatate (TESSALON) 200 MG capsule Take 200 mg by mouth in the morning, at  noon, and at bedtime.   Yes [provider]  docusate sodium (COLACE) 100 MG capsule Take 1 capsule (100 mg total) by mouth daily as needed. 08/06/20 08/06/21 Yes Aquilla Hacker, MD  EPINEPHrine (EPIPEN 2-PAK) 0.3 mg/0.3 mL IJ SOAJ injection Inject 0.3 mg into the muscle as needed for anaphylaxis.    Yes [provider]  escitalopram (LEXAPRO) 10 MG tablet Take 1 tablet (10 mg total) by mouth daily. 08/06/20  Yes Aquilla Hacker, MD  ferrous sulfate 325 (65 FE) MG EC tablet Take 1 tablet (325 mg total)  by mouth 2 (two) times daily. 08/06/20 09/05/20 Yes Aquilla Hacker, MD  fluconazole (DIFLUCAN) 100 MG tablet 2 tabs the first day, then 1 daily until gone 08/08/20  Yes Kalman Shan, MD  hydroxychloroquine (PLAQUENIL) 200 MG tablet Take 1 tablet (200 mg total) by mouth 2 (two) times daily. 08/06/20  Yes Aquilla Hacker, MD  ipratropium-albuterol (DUONEB) 0.5-2.5 (3) MG/3ML SOLN Take 3 mLs by nebulization 3 (three) times daily. 08/06/20  Yes Aquilla Hacker, MD  Magnesium Oxide 400 MG CAPS Take 1 capsule (400 mg total) by mouth daily for 14 days. 08/06/20 08/20/20 Yes Aquilla Hacker, MD  OXYGEN Inhale 4 L/L into the lungs daily as needed (breathing). continuously   Yes [provider]  pantoprazole (PROTONIX) 40 MG tablet Take 1 tablet (40 mg total) by mouth 2 (two) times daily. 08/06/20 10/05/20 Yes Aquilla Hacker, MD  potassium chloride (KLOR-CON) 10 MEQ tablet Take 1 tablet (10 mEq total) by mouth daily. 08/06/20 09/05/20 Yes Aquilla Hacker, MD  sucralfate (CARAFATE) 1 g tablet Take 1 g by mouth 4 (four) times daily.   Yes [provider]  dimethicone (NEUTRAPHOR) 1 % cream Apply 1 application topically 2 (two) times daily as needed for up to 14 days for dry skin. 08/06/20 08/20/20  Aquilla Hacker, MD  feeding supplement (ENSURE ENLIVE / ENSURE PLUS) LIQD Take 237 mLs by mouth 2 (two) times daily between meals for 7 days. Patient not taking:  Reported on 08/11/2020 08/06/20 08/13/20  Aquilla Hacker, MD  levofloxacin (LEVAQUIN) 750 MG tablet Take 1 tablet (750 mg total) by mouth daily for 4 days. 08/06/20 08/11/20  Aquilla Hacker, MD  Multiple Vitamin (MULTIVITAMIN WITH MINERALS) TABS tablet Take 1 tablet by mouth daily. Patient not taking: Reported on 08/11/2020 08/07/20 09/06/20  Aquilla Hacker, MD  ondansetron (ZOFRAN) 4 MG tablet Take 1 tablet (4 mg total) by mouth 3 (three) times daily as needed for nausea. Patient not taking: Reported on 08/04/2020 01/10/20   Kalman Shan, MD  polyethylene glycol (MIRALAX / GLYCOLAX) 17 g packet Take 17 g by mouth daily as needed for up to 14 days for mild constipation. 08/06/20 08/20/20  Aquilla Hacker, MD      Steffanie Dunn, DO 08/11/20 6:06 PM [facility]

## 2020-08-11 NOTE — ED Provider Notes (Signed)
[facility]-EMERGENCY DEPT Provider Note  CSN: [id number] Arrival date & time: 08/27/2020 2256  Chief Complaint(s) Shortness of Breath  HPI Kimberly Shelton is a 59 y.o. female with a past medical history listed below including pulmonary fibrosis/interstitial lung disease, diastolic heart failure, COPD now on 4 L nasal cannula at home.  Patient has an oxygen compressor with a long nasal cannula tubing. Patient was admitted on the end of November for shortness of breath and increased oxygen requirement attributed to likely pneumonia.  She was treated with 5-day course of Levaquin.  She was admitted for 2 days and discharged.  Sent home on antibiotics which she has completed. She reports that since being home she has had persistent shortness of breath which is exacerbated with minimal exertion. She reports that her saturations have been staying around the 80s and her heart rate has been in the 110s with exertion. Patient has been using frequent breathing treatments without relief. She is endorsing persistent coughing. No fevers. No chest pain No nausea or vomiting No abdominal pain No lower extremity swelling.  Patient brought in by EMS who noted her saturations were 80% on her 4 L nasal cannula at home.  Patient was given Solu-Medrol, magnesium and duo nebs in route.  She was placed on nonrebreather.  Reports improvement while being on a nonrebreather.  HPI  Past Medical History Past Medical History:  Diagnosis Date  . Abnormal Pap smear 09/2006   HGSIL CIN 2/VAIN/ CIN 3/VAIN-3 CIS  . CHF (congestive heart failure) (HCC)   . COPD (chronic obstructive pulmonary disease) (HCC)   . Foot fracture, left 10/16   hair-line fracture  . History of pneumonia 02/09/2016  . Interstitial lung disease (HCC)   . Pneumonia    was hospitalized  . RA (rheumatoid arthritis) (HCC) 07/2009   Patient Active Problem List   Diagnosis Date Noted  . Gastric ulcer: Per EGD 06/15/2020  08/05/2020  . Duodenal ulcer: Per EGD 06/15/2020 08/05/2020  . [place] grade D esophagitis: The EGD 06/15/2020 08/05/2020  . Pneumonia of both lungs due to infectious organism   . Acute respiratory failure with hypoxia (HCC) 08/04/2020  . COPD with acute exacerbation (HCC) 08/04/2020  . Upper GI bleed   . Atrial fibrillation (HCC)   . Acute calculous cholecystitis 06/09/2020  . Nausea & vomiting 06/09/2020  . Hiatal hernia 06/09/2020  . Immunosuppression due to drug therapy (HCC) 06/09/2020  . Conductive hearing loss of both ears 06/09/2020  . Emphysema of lung (HCC) 06/09/2020  . Interstitial lung disease due to connective tissue disease (HCC) 06/09/2020  . Coronary artery calcification of native artery 06/09/2020  . Bronchiectasis (HCC) 06/09/2020  . Cholecystitis 06/09/2020  . Constipation 08/13/2019  . Chronic Hemoptysis from cavitary Aspergillosis 08/01/2019  . Medication monitoring encounter 03/20/2019  . Aspergillosis (HCC) 03/14/2019  . Former smoker 03/13/2019  . Referred otalgia of both ears 03/13/2019  . Cavitary lung disease 02/27/2018  . Aortic atherosclerosis (HCC) 12/29/2017  . Atrial tachycardia (HCC) 12/29/2017  . Atrial septal aneurysm 12/28/2017  . History of pneumonia 02/09/2016  . History of smoking 02/09/2016  . CIN III (cervical intraepithelial neoplasia III) 09/21/2015  . Rheumatoid arthritis involving multiple joints (HCC) 08/09/2014  . Chronic diastolic heart failure (HCC) 02/19/2014  . DEPRESSION 03/26/2009  . CARPAL TUNNEL SYNDROME 03/26/2009  . ARTHRALGIA 03/26/2009   Home Medication(s) Prior to Admission medications   Medication Sig Start Date End Date Taking? Authorizing Provider  acetaminophen (TYLENOL) 325 MG tablet  Take 2 tablets (650 mg total) by mouth every 6 (six) hours as needed for mild pain (or Fever >/= 101). 06/12/20   Juliet Rude, PA-C  albuterol (PROVENTIL) (2.5 MG/3ML) 0.083% nebulizer solution Take 3 mLs (2.5 mg total) by  nebulization every 2 (two) hours as needed for shortness of breath. 08/06/20   Aquilla Hacker, MD  albuterol (VENTOLIN HFA) 108 (90 Base) MCG/ACT inhaler INHALE 2 PUFFS INTO THE LUNGS EVERY 6 HOURS AS NEEDED FOR WHEEZING OR SHORTNESS OF BREATH 08/06/20   Aquilla Hacker, MD  ALPRAZolam Prudy Feeler) 0.25 MG tablet Take 1 tablet (0.25 mg total) by mouth daily as needed for anxiety. 08/08/20   Kalman Shan, MD  benzonatate (TESSALON) 200 MG capsule Take 200 mg by mouth in the morning, at noon, and at bedtime.    [provider]  dimethicone (NEUTRAPHOR) 1 % cream Apply 1 application topically 2 (two) times daily as needed for up to 14 days for dry skin. 08/06/20 08/20/20  Aquilla Hacker, MD  docusate sodium (COLACE) 100 MG capsule Take 1 capsule (100 mg total) by mouth daily as needed. 08/06/20 08/06/21  Aquilla Hacker, MD  EPINEPHrine (EPIPEN 2-PAK) 0.3 mg/0.3 mL IJ SOAJ injection Inject 0.3 mg into the muscle as needed for anaphylaxis.     [provider]  escitalopram (LEXAPRO) 10 MG tablet Take 1 tablet (10 mg total) by mouth daily. 08/06/20   Aquilla Hacker, MD  feeding supplement (ENSURE ENLIVE / ENSURE PLUS) LIQD Take 237 mLs by mouth 2 (two) times daily between meals for 7 days. 08/06/20 08/13/20  Aquilla Hacker, MD  ferrous sulfate 325 (65 FE) MG EC tablet Take 1 tablet (325 mg total) by mouth 2 (two) times daily. 08/06/20 09/05/20  Aquilla Hacker, MD  fluconazole (DIFLUCAN) 100 MG tablet 2 tabs the first day, then 1 daily until gone 08/08/20   Kalman Shan, MD  hydroxychloroquine (PLAQUENIL) 200 MG tablet Take 1 tablet (200 mg total) by mouth 2 (two) times daily. 08/06/20   Aquilla Hacker, MD  ipratropium-albuterol (DUONEB) 0.5-2.5 (3) MG/3ML SOLN Take 3 mLs by nebulization 3 (three) times daily. 08/06/20   Aquilla Hacker, MD  Magnesium Oxide 400 MG CAPS Take 1 capsule (400 mg total) by mouth daily for 14 days. 08/06/20 08/20/20  Aquilla Hacker, MD  Multiple  Vitamin (MULTIVITAMIN WITH MINERALS) TABS tablet Take 1 tablet by mouth daily. 08/07/20 09/06/20  Aquilla Hacker, MD  ondansetron (ZOFRAN) 4 MG tablet Take 1 tablet (4 mg total) by mouth 3 (three) times daily as needed for nausea. Patient not taking: Reported on 08/04/2020 01/10/20   Kalman Shan, MD  pantoprazole (PROTONIX) 40 MG tablet Take 1 tablet (40 mg total) by mouth 2 (two) times daily. 08/06/20 10/05/20  Aquilla Hacker, MD  polyethylene glycol (MIRALAX / GLYCOLAX) 17 g packet Take 17 g by mouth daily as needed for up to 14 days for mild constipation. 08/06/20 08/20/20  Aquilla Hacker, MD  potassium chloride (KLOR-CON) 10 MEQ tablet Take 1 tablet (10 mEq total) by mouth daily. 08/06/20 09/05/20  Aquilla Hacker, MD  Past Surgical History Past Surgical History:  Procedure Laterality Date  . BIOPSY  06/15/2020   Procedure: BIOPSY;  Surgeon: Benancio Deeds, MD;  Location: [facility];  Service: Gastroenterology;;  . BRONCHIAL WASHINGS  11/27/2019   Procedure: BRONCHIAL WASHINGS;  Surgeon: Kalman Shan, MD;  Location: [facility];  Service: Cardiopulmonary;;  . CERVICAL BIOPSY  W/ LOOP ELECTRODE EXCISION  2008   CIN 3  . CERVICAL CONIZATION W/BX N/A 12/15/2015   Procedure: CONIZATION CERVIX WITH BIOPSY ;  Surgeon: Jerene Bears, MD;  Location: [facility];  Service: Gynecology;  Laterality: N/A;  POSSIBLE COLD KNIFE CONE.  Please have the microscope in the room  . CHOLECYSTECTOMY N/A 06/10/2020   Procedure: LAPAROSCOPIC CHOLECYSTECTOMY;  Surgeon: Griselda Miner, MD;  Location: [facility];  Service: General;  Laterality: N/A;  . COLONOSCOPY    . ESOPHAGOGASTRODUODENOSCOPY (EGD) WITH PROPOFOL N/A 06/15/2020   Procedure: ESOPHAGOGASTRODUODENOSCOPY (EGD) WITH PROPOFOL;  Surgeon: Benancio Deeds, MD;  Location: [facility];  Service: Gastroenterology;   Laterality: N/A;  . IR RADIOLOGIST EVAL & MGMT  05/27/2020  . LEEP N/A 12/15/2015   Procedure: LOOP ELECTROSURGICAL EXCISION PROCEDURE (LEEP) with colpo;  Surgeon: Jerene Bears, MD;  Location: [facility];  Service: Gynecology;  Laterality: N/A;  . PILONIDAL CYST EXCISION  1999   I&D  . VIDEO BRONCHOSCOPY N/A 11/27/2019   Procedure: VIDEO BRONCHOSCOPY WITHOUT FLUORO;  Surgeon: Kalman Shan, MD;  Location: [facility];  Service: Cardiopulmonary;  Laterality: N/A;  . WISDOM TOOTH EXTRACTION     Family History Family History  Problem Relation Age of Onset  . Diabetes Mother   . Rheum arthritis Mother   . Diabetes Father   . COPD Father   . Bronchitis Father   . Cancer Brother        Lung  . Breast cancer Maternal Aunt 62  . CAD Neg Hx     Social History Social History   Tobacco Use  . Smoking status: Former Smoker    Packs/day: 1.00    Years: 30.00    Pack years: 30.00    Types: Cigarettes, E-cigarettes    Start date: 1985    Quit date: 02/16/2014    Years since quitting: 6.4  . Smokeless tobacco: Never Used  Vaping Use  . Vaping Use: Former  Substance Use Topics  . Alcohol use: No    Alcohol/week: 0.0 standard drinks  . Drug use: No   Allergies Bee venom, Benadryl allergy [diphenhydramine hcl], Codeine, Pirfenidone, and Penicillins  Review of Systems Review of Systems All other systems are reviewed and are negative for acute change except as noted in the HPI  Physical Exam Vital Signs  I have reviewed the triage vital signs BP (!) 115/54   Pulse (!) 119   Temp 99.4 F (37.4 C) (Rectal)   Resp (!) 31   Ht 5\' 3"  (1.6 m)   Wt 62.9 kg   LMP 09/06/2004   SpO2 100%   BMI 24.56 kg/m   Physical Exam Vitals reviewed.  Constitutional:      General: She is not in acute distress.    Appearance: She is well-developed. She is not diaphoretic.  HENT:     Head: Normocephalic and atraumatic.     Nose: Nose normal.  Eyes:     General: No scleral icterus.        Right eye: No discharge.        Left eye: No discharge.     Conjunctiva/sclera: Conjunctivae  normal.     Pupils: Pupils are equal, round, and reactive to light.  Cardiovascular:     Rate and Rhythm: Regular rhythm. Tachycardia present.     Heart sounds: No murmur heard.  No friction rub. No gallop.   Pulmonary:     Effort: Tachypnea and respiratory distress present.     Breath sounds: No stridor. Examination of the right-upper field reveals rales. Examination of the left-upper field reveals rales. Examination of the right-middle field reveals rales. Examination of the left-middle field reveals rales. Examination of the right-lower field reveals rales. Examination of the left-lower field reveals rales. Rales (fine) present. No wheezing.  Abdominal:     General: There is no distension.     Palpations: Abdomen is soft.     Tenderness: There is no abdominal tenderness.  Musculoskeletal:        General: No tenderness.     Cervical back: Normal range of motion and neck supple.  Skin:    General: Skin is warm and dry.     Findings: No erythema or rash.  Neurological:     Mental Status: She is alert and oriented to person, place, and time.     ED Results and Treatments Labs (all labs ordered are listed, but only abnormal results are displayed) Labs Reviewed  BLOOD GAS, ARTERIAL - Abnormal; Notable for the following components:      Result Value   pO2, Arterial 76.9 (*)    All other components within normal limits  CBC WITH DIFFERENTIAL/PLATELET - Abnormal; Notable for the following components:   WBC 22.1 (*)    Hemoglobin 9.5 (*)    HCT 32.6 (*)    MCH 23.4 (*)    MCHC 29.1 (*)    RDW 17.9 (*)    Platelets 489 (*)    Neutro Abs 18.4 (*)    Eosinophils Absolute 0.9 (*)    Abs Immature Granulocytes 0.43 (*)    All other components within normal limits  COMPREHENSIVE METABOLIC PANEL - Abnormal; Notable for the following components:   Sodium 134 (*)    Glucose, Bld 139 (*)     Calcium 7.9 (*)    Total Protein 6.4 (*)    Albumin 2.3 (*)    Alkaline Phosphatase 158 (*)    All other components within normal limits  BRAIN NATRIURETIC PEPTIDE - Abnormal; Notable for the following components:   B Natriuretic Peptide 148.7 (*)    All other components within normal limits  RESP PANEL BY RT-PCR (FLU A&B, COVID) [id number]                                                                                                                         EKG  EKG Interpretation  Date/Time:  Sunday 23-Aug-2020 23:55:58 EST Ventricular Rate:  127 PR Interval:    QRS Duration: 83 QT Interval:  340 QTC Calculation: 495 R Axis:   45 Text Interpretation: Sinus tachycardia LAE, consider biatrial enlargement  RSR' in V1 or V2, probably normal variant Borderline repol abnormality, lateral leads Borderline prolonged QT interval Confirmed by Drema Pry [phone]) on 08/11/2020 12:36:17 AM      Radiology DG Chest Port 1 View  Result Date: 08/23/2020 CLINICAL DATA:  Short of breath, cough EXAM: PORTABLE CHEST 1 VIEW COMPARISON:  08/04/2020 FINDINGS: Single frontal view of the chest demonstrates a stable cardiac silhouette. Multifocal bilateral areas of ground-glass airspace disease there are again noted, superimposed upon background scarring and fibrosis. No effusion or pneumothorax. No acute bony abnormalities. IMPRESSION: 1. Multifocal pneumonia versus edema, not appreciably changed since prior study. Electronically Signed   By: Sharlet Salina M.D.   On: 08/22/2020 23:48    Pertinent labs & imaging results that were available during my care of the patient were reviewed by me and considered in my medical decision making (see chart for details).  Medications Ordered in ED Medications  furosemide (LASIX) injection 40 mg (40 mg Intravenous Given 08/11/20 0045)                                                                                                                                     Procedures .1-3 Lead EKG Interpretation Performed by: Nira Conn, MD Authorized by: Nira Conn, MD     Interpretation: abnormal     ECG rate:  130   ECG rate assessment: tachycardic     Rhythm: sinus rhythm     Ectopy: none     Conduction: normal   .Critical Care Performed by: Nira Conn, MD Authorized by: Nira Conn, MD   Critical care provider statement:    Critical care time (minutes):  45   Critical care was necessary to treat or prevent imminent or life-threatening deterioration of the following conditions:  Respiratory failure   Critical care was time spent personally by me on the following activities:  Discussions with consultants, evaluation of patient's response to treatment, examination of patient, ordering and performing treatments and interventions, ordering and review of laboratory studies, ordering and review of radiographic studies, pulse oximetry, re-evaluation of patient's condition, obtaining history from patient or surrogate and review of old charts    (including critical care time)  Medical Decision Making / ED Course I have reviewed the nursing notes for this encounter and the patient's prior records (if available in EHR or on provided paperwork).   Kimberly Shelton was evaluated in Emergency Department on 08/11/2020 for the symptoms described in the history of present illness. She was evaluated in the context of the global COVID-19 pandemic, which necessitated consideration that the patient might be at risk for infection with the SARS-CoV-2 virus that causes COVID-19. Institutional protocols and algorithms that pertain to the evaluation of patients at risk for COVID-19 are in a state of rapid change based on information released by regulatory bodies including the CDC and federal and state organizations. These policies and algorithms  were followed during the patient's care in the ED.  Patient presents with  persistent shortness of breath and hypoxia on her home oxygen She had increased work of breathing. Improved with nonrebreather Lungs with diffused fine rales.  No wheezing.  Good air movement. She does not appear to be volume overloaded.  On review of records, patient had a CTA that was negative for PE but did reveal lung markings concerning for pulmonary edema versus atypical pneumonia.  Chest x-ray is unchanged from last week.  Patient's labs show improved leukocytosis with stable hemoglobin.  No significant electrolyte derangement or renal sufficiency.  ABG is notable for decreased PaO2. BNP slightly increased from prior.  Given Lasix for possible diastolic CHF exacerbation.  Patient admitted to hospital service for continued work-up and management.       Final Clinical Impression(s) / ED Diagnoses Final diagnoses:  SOB (shortness of breath)  Acute hypoxemic respiratory failure (HCC)      This chart was dictated using voice recognition software.  Despite best efforts to proofread,  errors can occur which can change the documentation meaning.   Nira Conn, MD 08/11/20 [phone]

## 2020-08-11 NOTE — Telephone Encounter (Signed)
Called and spoke with patient who is currently in the hospital at [facility] and wanted to let Dr. [name] know. Will send this as an FYI. Nothing further needed at this time.

## 2020-08-11 NOTE — H&P (Signed)
History and Physical    Kimberly Shelton:[id number] DOB: [birth year] DOA: 08/26/2020  PCP: Kristian Covey, MD   Patient coming from: Home   Chief Complaint: SOB, hypoxia   HPI: Kimberly Shelton is a 59 y.o. female with medical history significant for RA-ILD, COPD, HFpEF, depression, anxiety, PUD, and chronic anemia and thrombocytosis, now presenting to the emergency department for evaluation of shortness of breath and hypoxia.  Patient was discharged from the hospital on 08/06/2020 after treatment for suspected pneumonia, was on 4 L/min of supplemental oxygen at that time.  Since that discharge, she has completed her course of Levaquin, was started on fluconazole for suspected thrush, has developed worsening anxiety and reports oxygen saturation of 80% with slight exertion despite 4 L/min of supplemental oxygen.  She denies any associated chest pain, fevers, or chills.  She denies leg swelling or tenderness but does report some orthopnea.  ED Course: Upon arrival to the ED, patient is found to be afebrile, saturating upper 90s on nonrebreather, tachypneic in the 30s, tachycardic to 130, and with blood pressure as low as 85/53.  EKG features sinus tachycardia with rate 127.  Chest x-ray notable for unchanged multifocal opacities which could reflect pneumonia or edema.  Chemistry panel with normal renal function.  CBC reveals a leukocytosis 22,100, normocytic anemia with hemoglobin 9.5, and thrombocytosis with platelets 489,000.  BNP is elevated 249, up from 90 a week earlier.  Patient was given 40 mg IV Lasix in the ED.  Review of Systems:  All other systems reviewed and apart from HPI, are negative.  Past Medical History:  Diagnosis Date  . Abnormal Pap smear 09/2006   HGSIL CIN 2/VAIN/ CIN 3/VAIN-3 CIS  . CHF (congestive heart failure) (HCC)   . COPD (chronic obstructive pulmonary disease) (HCC)   . Foot fracture, left 10/16   hair-line fracture  . History of pneumonia 02/09/2016  .  Interstitial lung disease (HCC)   . Pneumonia    was hospitalized  . RA (rheumatoid arthritis) (HCC) 07/2009    Past Surgical History:  Procedure Laterality Date  . BIOPSY  06/15/2020   Procedure: BIOPSY;  Surgeon: Benancio Deeds, MD;  Location: [facility];  Service: Gastroenterology;;  . BRONCHIAL WASHINGS  11/27/2019   Procedure: BRONCHIAL WASHINGS;  Surgeon: Kalman Shan, MD;  Location: [facility];  Service: Cardiopulmonary;;  . CERVICAL BIOPSY  W/ LOOP ELECTRODE EXCISION  2008   CIN 3  . CERVICAL CONIZATION W/BX N/A 12/15/2015   Procedure: CONIZATION CERVIX WITH BIOPSY ;  Surgeon: Jerene Bears, MD;  Location: [facility];  Service: Gynecology;  Laterality: N/A;  POSSIBLE COLD KNIFE CONE.  Please have the microscope in the room  . CHOLECYSTECTOMY N/A 06/10/2020   Procedure: LAPAROSCOPIC CHOLECYSTECTOMY;  Surgeon: Griselda Miner, MD;  Location: [facility];  Service: General;  Laterality: N/A;  . COLONOSCOPY    . ESOPHAGOGASTRODUODENOSCOPY (EGD) WITH PROPOFOL N/A 06/15/2020   Procedure: ESOPHAGOGASTRODUODENOSCOPY (EGD) WITH PROPOFOL;  Surgeon: Benancio Deeds, MD;  Location: [facility];  Service: Gastroenterology;  Laterality: N/A;  . IR RADIOLOGIST EVAL & MGMT  05/27/2020  . LEEP N/A 12/15/2015   Procedure: LOOP ELECTROSURGICAL EXCISION PROCEDURE (LEEP) with colpo;  Surgeon: Jerene Bears, MD;  Location: [facility];  Service: Gynecology;  Laterality: N/A;  . PILONIDAL CYST EXCISION  1999   I&D  . VIDEO BRONCHOSCOPY N/A 11/27/2019   Procedure: VIDEO BRONCHOSCOPY WITHOUT FLUORO;  Surgeon: Kalman Shan, MD;  Location: [facility];  Service: Cardiopulmonary;  Laterality: N/A;  . WISDOM TOOTH EXTRACTION      Social History:   reports that she quit smoking about 6 years ago. Her smoking use included cigarettes and e-cigarettes. She started smoking about 36 years ago. She has a 30.00 pack-year smoking history. She has never used smokeless tobacco. She reports that she does not  drink alcohol and does not use drugs.  Allergies  Allergen Reactions  . Bee Venom Anaphylaxis  . Benadryl Allergy [Diphenhydramine Hcl]     Went into afib  . Codeine Other (See Comments)    "seeing spots"  . Pirfenidone Other (See Comments)    GI side effects  . Penicillins Rash    Has patient had a PCN reaction causing immediate rash, facial/tongue/throat swelling, SOB or lightheadedness with hypotension: No Has patient had a PCN reaction causing severe rash involving mucus membranes or skin necrosis: No Has patient had a PCN reaction that required hospitalization No Has patient had a PCN reaction occurring within the last 10 years: Yes If all of the above answers are "NO", then may proceed with Cephalosporin use.     Family History  Problem Relation Age of Onset  . Diabetes Mother   . Rheum arthritis Mother   . Diabetes Father   . COPD Father   . Bronchitis Father   . Cancer Brother        Lung  . Breast cancer Maternal Aunt 33  . CAD Neg Hx      Prior to Admission medications   Medication Sig Start Date End Date Taking? Authorizing Provider  acetaminophen (TYLENOL) 325 MG tablet Take 2 tablets (650 mg total) by mouth every 6 (six) hours as needed for mild pain (or Fever >/= 101). 06/12/20   Juliet Rude, PA-C  albuterol (PROVENTIL) (2.5 MG/3ML) 0.083% nebulizer solution Take 3 mLs (2.5 mg total) by nebulization every 2 (two) hours as needed for shortness of breath. 08/06/20   Aquilla Hacker, MD  albuterol (VENTOLIN HFA) 108 (90 Base) MCG/ACT inhaler INHALE 2 PUFFS INTO THE LUNGS EVERY 6 HOURS AS NEEDED FOR WHEEZING OR SHORTNESS OF BREATH 08/06/20   Aquilla Hacker, MD  ALPRAZolam Prudy Feeler) 0.25 MG tablet Take 1 tablet (0.25 mg total) by mouth daily as needed for anxiety. 08/08/20   Kalman Shan, MD  benzonatate (TESSALON) 200 MG capsule Take 200 mg by mouth in the morning, at noon, and at bedtime.    [provider]  dimethicone (NEUTRAPHOR) 1 % cream  Apply 1 application topically 2 (two) times daily as needed for up to 14 days for dry skin. 08/06/20 08/20/20  Aquilla Hacker, MD  docusate sodium (COLACE) 100 MG capsule Take 1 capsule (100 mg total) by mouth daily as needed. 08/06/20 08/06/21  Aquilla Hacker, MD  EPINEPHrine (EPIPEN 2-PAK) 0.3 mg/0.3 mL IJ SOAJ injection Inject 0.3 mg into the muscle as needed for anaphylaxis.     [provider]  escitalopram (LEXAPRO) 10 MG tablet Take 1 tablet (10 mg total) by mouth daily. 08/06/20   Aquilla Hacker, MD  feeding supplement (ENSURE ENLIVE / ENSURE PLUS) LIQD Take 237 mLs by mouth 2 (two) times daily between meals for 7 days. 08/06/20 08/13/20  Aquilla Hacker, MD  ferrous sulfate 325 (65 FE) MG EC tablet Take 1 tablet (325 mg total) by mouth 2 (two) times daily. 08/06/20 09/05/20  Aquilla Hacker, MD  fluconazole (DIFLUCAN) 100 MG tablet 2 tabs the first day, then 1 daily until  gone 08/08/20   Kalman Shan, MD  hydroxychloroquine (PLAQUENIL) 200 MG tablet Take 1 tablet (200 mg total) by mouth 2 (two) times daily. 08/06/20   Aquilla Hacker, MD  ipratropium-albuterol (DUONEB) 0.5-2.5 (3) MG/3ML SOLN Take 3 mLs by nebulization 3 (three) times daily. 08/06/20   Aquilla Hacker, MD  Magnesium Oxide 400 MG CAPS Take 1 capsule (400 mg total) by mouth daily for 14 days. 08/06/20 08/20/20  Aquilla Hacker, MD  Multiple Vitamin (MULTIVITAMIN WITH MINERALS) TABS tablet Take 1 tablet by mouth daily. 08/07/20 09/06/20  Aquilla Hacker, MD  ondansetron (ZOFRAN) 4 MG tablet Take 1 tablet (4 mg total) by mouth 3 (three) times daily as needed for nausea. Patient not taking: Reported on 08/04/2020 01/10/20   Kalman Shan, MD  pantoprazole (PROTONIX) 40 MG tablet Take 1 tablet (40 mg total) by mouth 2 (two) times daily. 08/06/20 10/05/20  Aquilla Hacker, MD  polyethylene glycol (MIRALAX / GLYCOLAX) 17 g packet Take 17 g by mouth daily as needed for up to 14 days for mild constipation. 08/06/20  08/20/20  Aquilla Hacker, MD  potassium chloride (KLOR-CON) 10 MEQ tablet Take 1 tablet (10 mEq total) by mouth daily. 08/06/20 09/05/20  Aquilla Hacker, MD    Physical Exam: Vitals:   08/11/20 0100 08/11/20 0115 08/11/20 0130 08/11/20 0145  BP: (!) 118/56 (!) 113/50 (!) 115/52 (!) 112/37  Pulse: (!) 120 (!) 122 (!) 121 (!) 120  Resp: (!) 30 (!) 29 (!) 29 (!) 30  Temp:      TempSrc:      SpO2: 100% 99% 100%   Weight:      Height:        Constitutional: No pallor or diaphoresis, calm  Eyes: PERTLA, lids and conjunctivae normal ENMT: Mucous membranes are moist. Posterior pharynx clear of any exudate or lesions.   Neck: normal, supple, no masses, no thyromegaly Respiratory: Tachypneic, no wheezing. Increased WOB, speaking short sentences. No pallor or cyanosis.  Cardiovascular: Rate ~120 and regular. No extremity edema.   Abdomen: No distension, no tenderness, soft. Bowel sounds active.  Musculoskeletal: no clubbing / cyanosis. No joint deformity upper and lower extremities.   Skin: no significant rashes, lesions, ulcers. Warm, dry, well-perfused. Neurologic: CN 2-12 grossly intact. Sensation intact. Moving all extremities.  Psychiatric: Alert and oriented to person, place, and situation. Pleasant and cooperative.    Labs and Imaging on Admission: I have personally reviewed following labs and imaging studies  CBC: Recent Labs  Lab 08/04/20 1227 08/05/20 0455 08/06/20 0418 09/03/2020 2329  WBC 16.7* 13.4* 27.1* 22.1*  NEUTROABS  --   --   --  18.4*  HGB 10.7* 10.5* 10.1* 9.5*  HCT 35.7* 35.4* 34.0* 32.6*  MCV 81.0 81.9 81.7 80.3  PLT 589* 546* 674* 489*   Basic Metabolic Panel: Recent Labs  Lab 08/04/20 1227 08/05/20 0455 08/06/20 0418 09/05/2020 2329  NA 133* 136 137 134*  K 4.5 4.3 3.6 3.7  CL 94* 99 97* 98  CO2 26 25 27 22   GLUCOSE 106* 155* 127* 139*  BUN 10 10 17 10   CREATININE 0.56 0.48 0.68 0.57  CALCIUM 8.9 9.2 9.1 7.9*  MG  --   --  2.2  --     GFR: Estimated Creatinine Clearance: 66.8 mL/min (by C-G formula based on SCr of 0.57 mg/dL). Liver Function Tests: Recent Labs  Lab 08/19/2020 2329  AST 20  ALT 13  ALKPHOS 158*  BILITOT 0.5  PROT 6.4*  ALBUMIN 2.3*   No results for input(s): LIPASE, AMYLASE in the last 168 hours. No results for input(s): AMMONIA in the last 168 hours. Coagulation Profile: No results for input(s): INR, PROTIME in the last 168 hours. Cardiac Enzymes: No results for input(s): CKTOTAL, CKMB, CKMBINDEX, TROPONINI in the last 168 hours. BNP (last 3 results) No results for input(s): PROBNP in the last 8760 hours. HbA1C: No results for input(s): HGBA1C in the last 72 hours. CBG: No results for input(s): GLUCAP in the last 168 hours. Lipid Profile: No results for input(s): CHOL, HDL, LDLCALC, TRIG, CHOLHDL, LDLDIRECT in the last 72 hours. Thyroid Function Tests: No results for input(s): TSH, T4TOTAL, FREET4, T3FREE, THYROIDAB in the last 72 hours. Anemia Panel: No results for input(s): VITAMINB12, FOLATE, FERRITIN, TIBC, IRON, RETICCTPCT in the last 72 hours. Urine analysis:    Component Value Date/Time   COLORURINE YELLOW 06/09/2020 0117   APPEARANCEUR CLEAR 06/09/2020 0117   LABSPEC 1.031 (H) 06/09/2020 0117   PHURINE 6.0 06/09/2020 0117   GLUCOSEU NEGATIVE 06/09/2020 0117   HGBUR NEGATIVE 06/09/2020 0117   BILIRUBINUR NEGATIVE 06/09/2020 0117   BILIRUBINUR n 07/21/2015 1428   KETONESUR 5 (A) 06/09/2020 0117   PROTEINUR 30 (A) 06/09/2020 0117   UROBILINOGEN negative 07/21/2015 1428   UROBILINOGEN 2.0 (H) 02/16/2014 1226   NITRITE NEGATIVE 06/09/2020 0117   LEUKOCYTESUR NEGATIVE 06/09/2020 0117   Sepsis Labs: (procalcitonin:4,lacticidven:4) ) Recent Results (from the past 240 hour(s))  Resp Panel by RT-PCR (Flu A&B, Covid) Nasopharyngeal Swab     Status: None   Collection Time: 08/04/20 12:28 PM   Specimen: Nasopharyngeal Swab; Nasopharyngeal(NP) swabs in vial transport  medium  Result Value Ref Range Status   SARS Coronavirus 2 by RT PCR NEGATIVE NEGATIVE Final    Comment: (NOTE) SARS-CoV-2 target nucleic acids are NOT DETECTED.  The SARS-CoV-2 RNA is generally detectable in upper respiratory specimens during the acute phase of infection. The lowest concentration of SARS-CoV-2 viral copies this assay can detect is 138 copies/mL. A negative result does not preclude SARS-Cov-2 infection and should not be used as the sole basis for treatment or other patient management decisions. A negative result may occur with  improper specimen collection/handling, submission of specimen other than nasopharyngeal swab, presence of viral mutation(s) within the areas targeted by this assay, and inadequate number of viral copies(<138 copies/mL). A negative result must be combined with clinical observations, patient history, and epidemiological information. The expected result is Negative.  Fact Sheet for Patients:  BloggerCourse.com  Fact Sheet for Healthcare Providers:  SeriousBroker.it  This test is no t yet approved or cleared by the [place] FDA and  has been authorized for detection and/or diagnosis of SARS-CoV-2 by FDA under an Emergency Use Authorization (EUA). This EUA will remain  in effect (meaning this test can be used) for the duration of the COVID-19 declaration under Section 564(b)(1) of the Act, 21 U.S.C.section 360bbb-3(b)(1), unless the authorization is terminated  or revoked sooner.       Influenza A by PCR NEGATIVE NEGATIVE Final   Influenza B by PCR NEGATIVE NEGATIVE Final    Comment: (NOTE) The Xpert Xpress SARS-CoV-2/FLU/RSV plus assay is intended as an aid in the diagnosis of influenza from Nasopharyngeal swab specimens and should not be used as a sole basis for treatment. Nasal washings and aspirates are unacceptable for Xpert Xpress SARS-CoV-2/FLU/RSV testing.  Fact Sheet for  Patients: BloggerCourse.com  Fact Sheet for Healthcare Providers: SeriousBroker.it  This test is not  yet approved or cleared by the [place] and has been authorized for detection and/or diagnosis of SARS-CoV-2 by FDA under an Emergency Use Authorization (EUA). This EUA will remain in effect (meaning this test can be used) for the duration of the COVID-19 declaration under Section 564(b)(1) of the Act, 21 U.S.C. section 360bbb-3(b)(1), unless the authorization is terminated or revoked.  Performed at [facility], [address], [place], [place] [address]   Resp Panel by RT-PCR (Flu A&B, Covid) Nasopharyngeal Swab     Status: None   Collection Time: 2020-09-05 11:29 PM   Specimen: Nasopharyngeal Swab; Nasopharyngeal(NP) swabs in vial transport medium  Result Value Ref Range Status   SARS Coronavirus 2 by RT PCR NEGATIVE NEGATIVE Final    Comment: (NOTE) SARS-CoV-2 target nucleic acids are NOT DETECTED.  The SARS-CoV-2 RNA is generally detectable in upper respiratory specimens during the acute phase of infection. The lowest concentration of SARS-CoV-2 viral copies this assay can detect is 138 copies/mL. A negative result does not preclude SARS-Cov-2 infection and should not be used as the sole basis for treatment or other patient management decisions. A negative result may occur with  improper specimen collection/handling, submission of specimen other than nasopharyngeal swab, presence of viral mutation(s) within the areas targeted by this assay, and inadequate number of viral copies(<138 copies/mL). A negative result must be combined with clinical observations, patient history, and epidemiological information. The expected result is Negative.  Fact Sheet for Patients:  BloggerCourse.com  Fact Sheet for Healthcare Providers:  SeriousBroker.it  This test  is no t yet approved or cleared by the [place] FDA and  has been authorized for detection and/or diagnosis of SARS-CoV-2 by FDA under an Emergency Use Authorization (EUA). This EUA will remain  in effect (meaning this test can be used) for the duration of the COVID-19 declaration under Section 564(b)(1) of the Act, 21 U.S.C.section 360bbb-3(b)(1), unless the authorization is terminated  or revoked sooner.       Influenza A by PCR NEGATIVE NEGATIVE Final   Influenza B by PCR NEGATIVE NEGATIVE Final    Comment: (NOTE) The Xpert Xpress SARS-CoV-2/FLU/RSV plus assay is intended as an aid in the diagnosis of influenza from Nasopharyngeal swab specimens and should not be used as a sole basis for treatment. Nasal washings and aspirates are unacceptable for Xpert Xpress SARS-CoV-2/FLU/RSV testing.  Fact Sheet for Patients: BloggerCourse.com  Fact Sheet for Healthcare Providers: SeriousBroker.it  This test is not yet approved or cleared by the [place] FDA and has been authorized for detection and/or diagnosis of SARS-CoV-2 by FDA under an Emergency Use Authorization (EUA). This EUA will remain in effect (meaning this test can be used) for the duration of the COVID-19 declaration under Section 564(b)(1) of the Act, 21 U.S.C. section 360bbb-3(b)(1), unless the authorization is terminated or revoked.  Performed at [facility], [address], [place], [place] [address]      Radiological Exams on Admission: DG Chest Port 1 View  Result Date: 2020/09/05 CLINICAL DATA:  Short of breath, cough EXAM: PORTABLE CHEST 1 VIEW COMPARISON:  08/04/2020 FINDINGS: Single frontal view of the chest demonstrates a stable cardiac silhouette. Multifocal bilateral areas of ground-glass airspace disease there are again noted, superimposed upon background scarring and fibrosis. No effusion or pneumothorax. No acute bony  abnormalities. IMPRESSION: 1. Multifocal pneumonia versus edema, not appreciably changed since prior study. Electronically Signed   By: Sharlet Salina M.D.   On: 09-05-20 23:48  EKG: Independently reviewed. Sinus tachycardia (rate 127).   Assessment/Plan   1. Acute on chronic hypoxic respiratory failure; acute on chronic diastolic dysfunction  - Patient with ILD, COPD, HFpEF, requiring 4 Lpm supplemental O2 at time of recent discharge presents with increased SOB and saturation of 80% on 4 Lpm and is found to have stable CXR, no fever, and newly elevated BNP  - She was given 40 mg IV Lasix in ED, is diuresing well and reporting some improvement  - Continue diuresis with Lasix 20 mg IV q12h, monitor weight and I/Os    2. ILD; COPD  - Patient follows with pulmonology for RA-ILD on Plaquenil, told to stop prednisone at time of recent discharge at which point she was requiring 4 Lpm but now presenting with saturations ~80% on 4 Lpm  - There is no change in chronic cough, no wheezing on admission, and no fevers; her acute worsening is suspected secondary to hypervolemia as above  - Continue Plaquenil, supplemental O2, breathing treatments   3. Rheumatoid arthritis  - Continue Plaquenil    4. Anemia; leukocytosis; thrombocytosis   - Hgb 9.5 on admission, similar to priors with no active bleeding, she will continue iron supplementation  - Platelets 489k on admission, decreasing from last month and likely reactive  - WBC is 22.1k on admission without fever, decreased from time of recent discharge at which time she was taken off of prednisone and has since completed a course of Levaquin, will monitor and culture if febrile    5. PUD   - Continue PPI    6. Suspected thrush  - Continue fluconazole   7. Anxiety  - Continue low-dose Xanax as needed   8. Pulmonary aspergilloma  - Appears stable, no hemoptysis   9. Sinus tachycardia  - HR 120-130 in ED, confirmed sinus on EKG  - She was  tachycardic during recent admission and ruled-out for PE with CTA on 08/04/20  - She reports improvement in SOB with diuresis and HR now down to 110s; anxiety and decondition likely contributing, will continue current plan and monitor    DVT prophylaxis: Lovenox   Code Status: DNR  Family Communication: Discussed with patient  Disposition Plan:  Patient is from: Home  Anticipated d/c is to: TBD Anticipated d/c date is: 08/14/20 Patient currently: Pending improvement in oxygenation  Consults called: None  Admission status: Inpatient     Briscoe Deutscher, MD [facility]  08/11/2020, 1:57 AM

## 2020-08-11 NOTE — Progress Notes (Signed)
Patient ID: Kimberly Shelton, female   DOB: December 10, 1960, 59 y.o.   MRN: [id number] Patient was admitted on this morning for shortness of breath and hypoxia and has been started on intravenous Lasix.  Patient seen and examined at bedside and plan of care discussed with her.  I have reviewed patient's medical records including this morning's H&P, current vitals, labs and medications myself.  Continue supplemental oxygen and intravenous Lasix.  Repeat a.m. labs.  will also start intravenous Solu-Medrol.  Patient requesting to see Dr. [name]; I told her that I would let his partner, who is covering the hospital today, know about her request.  I have notified PCCM for consultation.

## 2020-08-12 DIAGNOSIS — J9621 Acute and chronic respiratory failure with hypoxia: Secondary | ICD-10-CM | POA: Diagnosis not present

## 2020-08-12 DIAGNOSIS — J84112 Idiopathic pulmonary fibrosis: Secondary | ICD-10-CM

## 2020-08-12 LAB — BASIC METABOLIC PANEL
Anion gap: 13 (ref 5–15)
BUN: 18 mg/dL (ref 6–20)
CO2: 28 mmol/L (ref 22–32)
Calcium: 8.7 mg/dL — ABNORMAL LOW (ref 8.9–10.3)
Chloride: 95 mmol/L — ABNORMAL LOW (ref 98–111)
Creatinine, Ser: 0.69 mg/dL (ref 0.44–1.00)
GFR, Estimated: >60 mL/min (ref >60)
Glucose, Bld: 157 mg/dL — ABNORMAL HIGH (ref 70–99)
Potassium: 3.4 mmol/L — ABNORMAL LOW (ref 3.5–5.1)
Sodium: 136 mmol/L (ref 135–145)

## 2020-08-12 LAB — CBC
HCT: 31.8 % — ABNORMAL LOW (ref 36.0–46.0)
Hemoglobin: 9.3 g/dL — ABNORMAL LOW (ref 12.0–15.0)
MCH: 23.2 pg — ABNORMAL LOW (ref 26.0–34.0)
MCHC: 29.2 g/dL — ABNORMAL LOW (ref 30.0–36.0)
MCV: 79.3 fL — ABNORMAL LOW (ref 80.0–100.0)
Platelets: 440 10*3/uL — ABNORMAL HIGH (ref 150–400)
RBC: 4.01 MIL/uL (ref 3.87–5.11)
RDW: 17.5 % — ABNORMAL HIGH (ref 11.5–15.5)
WBC: 22.8 10*3/uL — ABNORMAL HIGH (ref 4.0–10.5)
nRBC: 0 % (ref 0.0–0.2)

## 2020-08-12 LAB — PROCALCITONIN: Procalcitonin: 0.17 ng/mL

## 2020-08-12 LAB — MRSA PCR SCREENING: MRSA by PCR: NEGATIVE

## 2020-08-12 MED ORDER — LIP MEDEX EX OINT
TOPICAL_OINTMENT | CUTANEOUS | Status: AC
  Filled 2020-08-12: qty 7

## 2020-08-12 MED ORDER — ALPRAZOLAM 0.5 MG PO TABS
0.5000 mg | ORAL_TABLET | Freq: Three times a day (TID) | ORAL | Status: DC | PRN
  Administered 2020-08-12 – 2020-08-14 (×5): 0.5 mg via ORAL
  Filled 2020-08-12 (×5): qty 1

## 2020-08-12 MED ORDER — POTASSIUM CHLORIDE CRYS ER 20 MEQ PO TBCR
20.0000 meq | EXTENDED_RELEASE_TABLET | Freq: Once | ORAL | Status: AC
  Administered 2020-08-13: 20 meq via ORAL
  Filled 2020-08-12: qty 1

## 2020-08-12 MED ORDER — ORAL CARE MOUTH RINSE
15.0000 mL | Freq: Two times a day (BID) | OROMUCOSAL | Status: DC
  Administered 2020-08-12 – 2020-08-17 (×12): 15 mL via OROMUCOSAL

## 2020-08-12 MED ORDER — POTASSIUM CHLORIDE 10 MEQ/100ML IV SOLN
10.0000 meq | INTRAVENOUS | Status: AC
  Administered 2020-08-12 (×4): 10 meq via INTRAVENOUS
  Filled 2020-08-12 (×4): qty 100

## 2020-08-12 MED ORDER — CHLORHEXIDINE GLUCONATE CLOTH 2 % EX PADS
6.0000 | MEDICATED_PAD | Freq: Every day | CUTANEOUS | Status: DC
  Administered 2020-08-12 – 2020-08-16 (×4): 6 via TOPICAL

## 2020-08-12 MED ORDER — ENSURE ENLIVE PO LIQD
237.0000 mL | Freq: Two times a day (BID) | ORAL | Status: DC
  Administered 2020-08-13 – 2020-08-16 (×7): 237 mL via ORAL
  Filled 2020-08-12 (×2): qty 237

## 2020-08-12 MED ORDER — SALINE SPRAY 0.65 % NA SOLN
1.0000 | NASAL | Status: DC | PRN
  Administered 2020-08-16: 1 via NASAL
  Filled 2020-08-12: qty 44

## 2020-08-12 MED ORDER — ADULT MULTIVITAMIN W/MINERALS CH
1.0000 | ORAL_TABLET | Freq: Every day | ORAL | Status: DC
  Administered 2020-08-12 – 2020-08-15 (×4): 1 via ORAL
  Filled 2020-08-12 (×5): qty 1

## 2020-08-12 NOTE — Telephone Encounter (Addendum)
Lmtcb for [employer].

## 2020-08-12 NOTE — Consult Note (Signed)
   [facility] Inpatient Consult   08/12/2020  Kimberly Shelton 12/16/1960 [id number]   Patient chart has been reviewed due to readmissions less than 30 days and for high risk score, 24%, for unplanned readmissions.  Patient assessed for community [facility] Care Management follow up needs. Patient is eligible for services under [facility] plan.  Plan: Will continue to follow for progression and disposition plans.  Of note, [facility] Care Management services does not replace or interfere with any services that are arranged by inpatient case management or social work.  Christophe Louis, MSN, RN [facility] Liaison Nurse Mobile Phone [phone]  Toll free office [phone]

## 2020-08-12 NOTE — Progress Notes (Addendum)
Patient ID: Kimberly Shelton, female   DOB: 1960-10-04, 59 y.o.   MRN: [id number]  PROGRESS NOTE    Kimberly Shelton  WUJ:[id number] DOB: Mar 17, 1961 DOA: 08/22/2020 PCP: Kristian Covey, MD   Brief Narrative:  59 y.o. female with medical history significant for RA-ILD, COPD, HFpEF, depression, anxiety, PUD, and chronic anemia and thrombocytosis, chronic hypoxic respiratory failure in recent admission to the hospital from 08/04/2020-08/06/2020 for respiratory failure from multifocal pneumonia treated with antibiotics and discharged on oxygen via 4 L nasal cannula and oral antibiotics presented on 08/16/2020 with shortness of breath and hypoxia.  On presentation, she was saturating in the upper 90s on nonrebreather, tachypneic in the 30s and tachycardic to 130 with systolic blood pressure in the 80s.  Chest x-ray was notable for unchanged multifocal opacities which could represent pneumonia or edema.  She had leukocytosis of 22,100.  BNP of 249.  She was given IV Lasix.  Subsequently pulmonary was consulted who started the patient on broad-spectrum antibiotics and steroids.  Assessment & Plan:   Acute on chronic hypoxic respiratory failure Probable flareup of UIP/RA-ILD -Patient was discharged on 4 L nasal cannula recently.  Presented on a nonrebreather.  Currently on 15 L high flow nasal cannula.  Respiratory status not improving much. -PCCM evaluation appreciated: Patient has been started on high-dose steroids and broad-spectrum antibiotics.  Chest x-ray on presentation showed findings suggestive of multifocal pneumonia versus edema, not quite changed from prior study. -I have also consulted palliative care as recommended by PCCM.  Overall prognosis is guarded to poor. -Continue Plaquenil  Acute on chronic diastolic heart failure -Strict input and output.  Daily weights.  Continue IV Lasix at 20 mg twice a day.  Strict input and output.  Daily weights.  Fluid restriction.  Anemia of chronic  disease -Hemoglobin stable.  No signs of bleeding.  Monitor  Leukocytosis -Monitor.  Currently on broad-spectrum antibiotics  Hypokalemia -Replace.  Repeat a.m. labs  Thrombocytosis -Possibly reactive.  Monitor  PUD -Continue PPI  Suspected thrush  - Continue fluconazole   Anxiety  -Still feels very anxious.  Increase Xanax to 0.5 mg 3 times a day as needed.  Pulmonary aspergilloma  - Appears stable, no hemoptysis.  Pulmonary following  Generalized deconditioning -Palliative care plan as above.  Will need PT evaluation once a little more stable from respiratory point of view    DVT prophylaxis: Lovenox Code Status: DNR Family Communication: None at bedside Disposition Plan: Status is: Inpatient  Remains inpatient appropriate because:Inpatient level of care appropriate due to severity of illness   Dispo: The patient is from: Home              Anticipated d/c is to: Home              Anticipated d/c date is: > 3 days              Patient currently is not medically stable to d/c.   Consultants: PCCM/palliative care  Procedures: None  Antimicrobials:  Anti-infectives (From admission, onward)   Start     Dose/Rate Route Frequency Ordered Stop   08/12/20 0500  vancomycin (VANCOREADY) IVPB 750 mg/150 mL        750 mg 150 mL/hr over 60 Minutes Intravenous Every 12 hours 08/11/20 2113     08/11/20 2300  vancomycin (VANCOREADY) IVPB 750 mg/150 mL  Status:  Discontinued        750 mg 150 mL/hr over 60 Minutes Intravenous Every 12 hours  08/11/20 1025 08/11/20 2113   08/11/20 1030  vancomycin (VANCOREADY) IVPB 1250 mg/250 mL        1,250 mg 166.7 mL/hr over 90 Minutes Intravenous  Once 08/11/20 1025 08/11/20 1958   08/11/20 1030  ceFEPIme (MAXIPIME) 2 g in sodium chloride 0.9 % 100 mL IVPB        2 g 200 mL/hr over 30 Minutes Intravenous Every 8 hours 08/11/20 1025     08/11/20 1000  fluconazole (DIFLUCAN) tablet 100 mg        100 mg Oral Daily 08/11/20 0200  08/16/20 0959   08/11/20 1000  hydroxychloroquine (PLAQUENIL) tablet 200 mg        200 mg Oral 2 times daily 08/11/20 0210         Subjective: Patient seen and examined at bedside.  She feels that her breathing is slightly better but still feels very anxious intermittently.  Denies overnight fever, vomiting, chest pain.  Objective: Vitals:   08/12/20 0400 08/12/20 0740 08/12/20 0816 08/12/20 0932  BP: 133/61  (!) 127/58   Pulse: (!) 109  (!) 102   Resp: (!) 33  (!) 23   Temp: 97.7 F (36.5 C) 97.9 F (36.6 C)    TempSrc: Axillary Oral    SpO2: (!) 89%  93% 90%  Weight:      Height:        Intake/Output Summary (Last 24 hours) at 08/12/2020 1006 Last data filed at 08/12/2020 0800 Gross per 24 hour  Intake 450.1 ml  Output 725 ml  Net -274.9 ml   Filed Weights   08/29/2020 2318 08/12/20 0123  Weight: 62.9 kg 58.5 kg    Examination:  General exam: Looks chronically ill.  Currently on 15 L high flow nasal cannula. Respiratory system: Bilateral decreased breath sounds at bases with scattered crackles.  Intermittently tachypneic Cardiovascular system: S1 & S2 heard, tachycardic  gastrointestinal system: Abdomen is nondistended, soft and nontender. Normal bowel sounds heard. Extremities: No cyanosis, clubbing; trace lower extremity edema Central nervous system: Alert and oriented. No focal neurological deficits. Moving extremities Skin: No rashes, lesions or ulcers Psychiatry: Looks anxious.    Data Reviewed: I have personally reviewed following labs and imaging studies  CBC: Recent Labs  Lab 08/06/20 0418 08/06/2020 2329 08/11/20 0734 08/12/20 0252  WBC 27.1* 22.1* 19.9* 22.8*  NEUTROABS  --  18.4*  --   --   HGB 10.1* 9.5* 9.6* 9.3*  HCT 34.0* 32.6* 32.1* 31.8*  MCV 81.7 80.3 80.0 79.3*  PLT 674* 489* 415* 440*   Basic Metabolic Panel: Recent Labs  Lab 08/06/20 0418 08/29/2020 2329 08/11/20 0734 08/12/20 0252  NA 137 134* 136 136  K 3.6 3.7 3.9 3.4*  CL  97* 98 95* 95*  CO2 27 22 28 28   GLUCOSE 127* 139* 171* 157*  BUN 17 10 12 18   CREATININE 0.68 0.57 0.64 0.69  CALCIUM 9.1 7.9* 8.5* 8.7*  MG 2.2  --  2.6*  --    GFR: Estimated Creatinine Clearance: 61.9 mL/min (by C-G formula based on SCr of 0.69 mg/dL). Liver Function Tests: Recent Labs  Lab 08/09/2020 2329  AST 20  ALT 13  ALKPHOS 158*  BILITOT 0.5  PROT 6.4*  ALBUMIN 2.3*   No results for input(s): LIPASE, AMYLASE in the last 168 hours. No results for input(s): AMMONIA in the last 168 hours. Coagulation Profile: No results for input(s): INR, PROTIME in the last 168 hours. Cardiac Enzymes: No results for input(s): CKTOTAL,  CKMB, CKMBINDEX, TROPONINI in the last 168 hours. BNP (last 3 results) No results for input(s): PROBNP in the last 8760 hours. HbA1C: No results for input(s): HGBA1C in the last 72 hours. CBG: No results for input(s): GLUCAP in the last 168 hours. Lipid Profile: No results for input(s): CHOL, HDL, LDLCALC, TRIG, CHOLHDL, LDLDIRECT in the last 72 hours. Thyroid Function Tests: No results for input(s): TSH, T4TOTAL, FREET4, T3FREE, THYROIDAB in the last 72 hours. Anemia Panel: No results for input(s): VITAMINB12, FOLATE, FERRITIN, TIBC, IRON, RETICCTPCT in the last 72 hours. Sepsis Labs: Recent Labs  Lab 08/11/20 0734 08/12/20 0252  PROCALCITON 0.26 0.17    Recent Results (from the past 240 hour(s))  Resp Panel by RT-PCR (Flu A&B, Covid) Nasopharyngeal Swab     Status: None   Collection Time: 08/04/20 12:28 PM   Specimen: Nasopharyngeal Swab; Nasopharyngeal(NP) swabs in vial transport medium  Result Value Ref Range Status   SARS Coronavirus 2 by RT PCR NEGATIVE NEGATIVE Final    Comment: (NOTE) SARS-CoV-2 target nucleic acids are NOT DETECTED.  The SARS-CoV-2 RNA is generally detectable in upper respiratory specimens during the acute phase of infection. The lowest concentration of SARS-CoV-2 viral copies this assay can detect is 138  copies/mL. A negative result does not preclude SARS-Cov-2 infection and should not be used as the sole basis for treatment or other patient management decisions. A negative result may occur with  improper specimen collection/handling, submission of specimen other than nasopharyngeal swab, presence of viral mutation(s) within the areas targeted by this assay, and inadequate number of viral copies(<138 copies/mL). A negative result must be combined with clinical observations, patient history, and epidemiological information. The expected result is Negative.  Fact Sheet for Patients:  BloggerCourse.com  Fact Sheet for Healthcare Providers:  SeriousBroker.it  This test is no t yet approved or cleared by the [place] FDA and  has been authorized for detection and/or diagnosis of SARS-CoV-2 by FDA under an Emergency Use Authorization (EUA). This EUA will remain  in effect (meaning this test can be used) for the duration of the COVID-19 declaration under Section 564(b)(1) of the Act, 21 U.S.C.section 360bbb-3(b)(1), unless the authorization is terminated  or revoked sooner.       Influenza A by PCR NEGATIVE NEGATIVE Final   Influenza B by PCR NEGATIVE NEGATIVE Final    Comment: (NOTE) The Xpert Xpress SARS-CoV-2/FLU/RSV plus assay is intended as an aid in the diagnosis of influenza from Nasopharyngeal swab specimens and should not be used as a sole basis for treatment. Nasal washings and aspirates are unacceptable for Xpert Xpress SARS-CoV-2/FLU/RSV testing.  Fact Sheet for Patients: BloggerCourse.com  Fact Sheet for Healthcare Providers: SeriousBroker.it  This test is not yet approved or cleared by the [place] FDA and has been authorized for detection and/or diagnosis of SARS-CoV-2 by FDA under an Emergency Use Authorization (EUA). This EUA will remain in effect (meaning  this test can be used) for the duration of the COVID-19 declaration under Section 564(b)(1) of the Act, 21 U.S.C. section 360bbb-3(b)(1), unless the authorization is terminated or revoked.  Performed at [facility], [address], [place], [place] [address]   Resp Panel by RT-PCR (Flu A&B, Covid) Nasopharyngeal Swab     Status: None   Collection Time: 08/15/2020 11:29 PM   Specimen: Nasopharyngeal Swab; Nasopharyngeal(NP) swabs in vial transport medium  Result Value Ref Range Status   SARS Coronavirus 2 by RT PCR NEGATIVE NEGATIVE Final    Comment: (  NOTE) SARS-CoV-2 target nucleic acids are NOT DETECTED.  The SARS-CoV-2 RNA is generally detectable in upper respiratory specimens during the acute phase of infection. The lowest concentration of SARS-CoV-2 viral copies this assay can detect is 138 copies/mL. A negative result does not preclude SARS-Cov-2 infection and should not be used as the sole basis for treatment or other patient management decisions. A negative result may occur with  improper specimen collection/handling, submission of specimen other than nasopharyngeal swab, presence of viral mutation(s) within the areas targeted by this assay, and inadequate number of viral copies(<138 copies/mL). A negative result must be combined with clinical observations, patient history, and epidemiological information. The expected result is Negative.  Fact Sheet for Patients:  BloggerCourse.com  Fact Sheet for Healthcare Providers:  SeriousBroker.it  This test is no t yet approved or cleared by the [place] FDA and  has been authorized for detection and/or diagnosis of SARS-CoV-2 by FDA under an Emergency Use Authorization (EUA). This EUA will remain  in effect (meaning this test can be used) for the duration of the COVID-19 declaration under Section 564(b)(1) of the Act, 21 U.S.C.section 360bbb-3(b)(1), unless  the authorization is terminated  or revoked sooner.       Influenza A by PCR NEGATIVE NEGATIVE Final   Influenza B by PCR NEGATIVE NEGATIVE Final    Comment: (NOTE) The Xpert Xpress SARS-CoV-2/FLU/RSV plus assay is intended as an aid in the diagnosis of influenza from Nasopharyngeal swab specimens and should not be used as a sole basis for treatment. Nasal washings and aspirates are unacceptable for Xpert Xpress SARS-CoV-2/FLU/RSV testing.  Fact Sheet for Patients: BloggerCourse.com  Fact Sheet for Healthcare Providers: SeriousBroker.it  This test is not yet approved or cleared by the [place] FDA and has been authorized for detection and/or diagnosis of SARS-CoV-2 by FDA under an Emergency Use Authorization (EUA). This EUA will remain in effect (meaning this test can be used) for the duration of the COVID-19 declaration under Section 564(b)(1) of the Act, 21 U.S.C. section 360bbb-3(b)(1), unless the authorization is terminated or revoked.  Performed at [facility], [address], [place], [place] [address]   MRSA PCR Screening     Status: None   Collection Time: 08/12/20  1:18 AM   Specimen: Nasal Mucosa; Nasopharyngeal  Result Value Ref Range Status   MRSA by PCR NEGATIVE NEGATIVE Final    Comment:        The GeneXpert MRSA Assay (FDA approved for NASAL specimens only), is one component of a comprehensive MRSA colonization surveillance program. It is not intended to diagnose MRSA infection nor to guide or monitor treatment for MRSA infections. Performed at [facility], [address], [place], [place] [address]          Radiology Studies: DG Chest Port 1 View  Result Date: Aug 23, 2020 CLINICAL DATA:  Short of breath, cough EXAM: PORTABLE CHEST 1 VIEW COMPARISON:  08/04/2020 FINDINGS: Single frontal view of the chest demonstrates a stable cardiac silhouette.  Multifocal bilateral areas of ground-glass airspace disease there are again noted, superimposed upon background scarring and fibrosis. No effusion or pneumothorax. No acute bony abnormalities. IMPRESSION: 1. Multifocal pneumonia versus edema, not appreciably changed since prior study. Electronically Signed   By: Sharlet Salina M.D.   On: 08-23-2020 23:48        Scheduled Meds: . Chlorhexidine Gluconate Cloth  6 each Topical Daily  . enoxaparin (LOVENOX) injection  40 mg Subcutaneous Q24H  . fluconazole  100 mg Oral Daily  . furosemide  20 mg Intravenous BID  . hydroxychloroquine  200 mg Oral BID  . ipratropium-albuterol  3 mL Nebulization TID  . mouth rinse  15 mL Mouth Rinse BID  . methylPREDNISolone (SOLU-MEDROL) injection  125 mg Intravenous Q6H  . pantoprazole  40 mg Oral BID   Continuous Infusions: . sodium chloride    . ceFEPime (MAXIPIME) IV Stopped (08/12/20 0235)  . potassium chloride 10 mEq (08/12/20 0945)  . vancomycin Stopped (08/12/20 0515)          Glade Lloyd, MD [facility] 08/12/2020, 10:06 AM

## 2020-08-12 NOTE — TOC Progression Note (Signed)
Transition of Care [facility]) - Progression Note    Patient Details  Name: Kimberly Shelton MRN: [id number] Date of Birth: July 12, 1961  Transition of [facility]) CM/SW Contact  Golda Acre, RN Phone Number: 08/12/2020, 8:27 AM  Clinical Narrative:    59 year old woman with a history of known UIP, recently started on supplemental oxygen as an outpatient about 2 weeks ago.  She has had progressive shortness of breath.  She was discharged home on levofloxacin last week but had progressive shortness of breath.  She was given Xanax for anxiety, but had ongoing progressive shortness of breath.  She represented to the hospital with saturations in the 70s on her home 4 L.  During her recent admission she was not given steroids due to concern for duodenal ulcers discovered in the past few months.  She has been intolerant to antifibrotic therapy as an outpatient. She denies symptoms of an acute viral illness. She feels fine other than SOB.  She has remote history of cavitary pneumococcal pneumonia and subsequently developed an aspergilloma.  She was treated with long-term voriconazole with resolution.  No recent hemoptysis.  She was diagnosed with UIP due to RA in 2018.  Until about 6 weeks ago when she was admitted for acute cholecystitis she had no limiting shortness of breath.  Plan request for snf placement made Following for progression      Expected Discharge Plan and Services           Expected Discharge Date:  (unknown)                                     Social Determinants of Health (SDOH) Interventions    Readmission Risk Interventions No flowsheet data found.

## 2020-08-12 NOTE — Progress Notes (Signed)
eLink Physician-Brief Progress Note Patient Name: Kimberly Shelton DOB: 13-Sep-1960 MRN: [id number]   Date of Service  08/12/2020  HPI/Events of Note  Notified K 3.4, creatinine 0.69  eICU Interventions  Initiate electrolyte replacement protocol     Intervention Category Major Interventions: Electrolyte abnormality - evaluation and management  Darl Pikes 08/12/2020, 5:02 AM

## 2020-08-12 NOTE — Progress Notes (Signed)
Initial Nutrition Assessment  RD working remotely.  DOCUMENTATION CODES:   Not applicable  INTERVENTION:  - will order Ensure Enlive BID, each supplement provides 350 kcal and 20 grams of protein. - will order 1 tablet multivitamin with minerals. - complete NFPE at follow-up.   NUTRITION DIAGNOSIS:   Increased nutrient needs related to acute illness, chronic illness as evidenced by estimated needs.  GOAL:   Patient will meet greater than or equal to 90% of their needs  MONITOR:   PO intake, Supplement acceptance, Labs, Weight trends  REASON FOR ASSESSMENT:   Malnutrition Screening Tool  ASSESSMENT:   59 y.o. female with medical history of RA-ILD, COPD, HFpEF, depression, anxiety, PUD, and chronic anemia and thrombocytosis. She presented to the ED with SOB and hypoxia. She was recently admitted and was discharged on 12/1 after treatment for PNA. Since discharge she has been on fluconazole for suspected thrush and has developed worsening anxiety.  No intakes documented since admission. Weight today is 129 lb and weight on 11/16 was 138 lb. This indicates 9 lb weight loss (6.5% body weight) in the past ~3 weeks; significant for time frame. No information documented in the edema section of flow sheet.   Patient was assessed remotely by another RD on 11/30 and in person on 10/5. At time of in person assessment, she was NPO and was being taken to OR for lap chole after CT on 10/4 showed acute cholecystitis. It was noted that she was sick/vomited following a meal on 10/3 and was experiencing abdominal pain.   Per notes: - acute on chronic hypoxic respiratory failure - CXR on admission showed likely PNA vs edema - Palliative Care consulted with prognosis being guarded to poor - acute on chronic CHF with strict I/Os ordered - leukocytosis - suspected thrush - generalized deconditioning    Labs reviewed; K: 3.4 mmol/l, Cl: 95 mmol/l, Ca: 8.7 mg/dl, Mg: 2.6 mg/dl. Medications  reviewed; 125 mg sol-medrol QID, 40 mg oral protonix BID, 10 mEq IV KCl x4 runs 12/7, 20 mEq Klor-Con x1 dose 12/8.     NUTRITION - FOCUSED PHYSICAL EXAM:  unable to complete at this time.   Diet Order:   Diet Order            Diet Heart Room service appropriate? Yes; Fluid consistency: Thin; Fluid restriction: 1500 mL Fluid  Diet effective now                 EDUCATION NEEDS:   Not appropriate for education at this time  Skin:  Skin Assessment: Reviewed RN Assessment  Last BM:  PTA/unknown  Height:   Ht Readings from Last 1 Encounters:  08/12/20 5\' 3"  (1.6 m)    Weight:   Wt Readings from Last 1 Encounters:  08/12/20 58.5 kg     Estimated Nutritional Needs:  Kcal:  14/07/21 kcal Protein:  80-90 grams Fluid:  >/= 1.8 L/day      [phone], MS, RD, LDN, CNSC Inpatient Clinical Dietitian RD pager # available in [facility]  After hours/weekend pager # available in [facility]

## 2020-08-12 NOTE — ED Notes (Signed)
ED TO INPATIENT HANDOFF REPORT  Name/Age/Gender Kimberly Shelton 59 y.o. female  Code Status    Code Status Orders  (From admission, onward)         Start     Ordered   08/11/20 0155  Do not attempt resuscitation (DNR)  Continuous       Question Answer Comment  In the event of cardiac or respiratory ARREST Do not call a "code blue"   In the event of cardiac or respiratory ARREST Do not perform Intubation, CPR, defibrillation or ACLS   In the event of cardiac or respiratory ARREST Use medication by any route, position, wound care, and other measures to relive pain and suffering. May use oxygen, suction and manual treatment of airway obstruction as needed for comfort.      08/11/20 0156        Code Status History    Date Active Date Inactive Code Status Order ID Comments User Context   08/04/2020 1700 08/06/2020 2130 DNR [id number]  Jae Dire, MD Inpatient   06/09/2020 0829 06/16/2020 2202 Full Code [id number]  Jae Dire, MD ED   02/16/2014 1606 02/24/2014 1746 Full Code [id number]  Dorothea Ogle, MD Inpatient   Advance Care Planning Activity      Home/SNF/Other Home  Chief Complaint Acute on chronic respiratory failure with hypoxia (HCC) [J96.21]  Level of Care/Admitting Diagnosis ED Disposition    ED Disposition Condition Comment   Admit  Hospital Area: [facility] [[id number]]  Level of Care: Stepdown [14]  Admit to SDU based on following criteria: Respiratory Distress:  Frequent assessment and/or intervention to maintain adequate ventilation/respiration, pulmonary toilet, and respiratory treatment.  May admit patient to [facility] or [facility] if equivalent level of care is available:: Yes  Covid Evaluation: Confirmed COVID Negative  Diagnosis: Acute on chronic respiratory failure with hypoxia [facility]) [[id number]]  Admitting Physician: Briscoe Deutscher [[id number]]  Attending Physician: Briscoe Deutscher [[id number]]  Estimated length of stay: past  midnight tomorrow  Certification:: I certify this patient will need inpatient services for at least 2 midnights       Medical History Past Medical History:  Diagnosis Date  . Abnormal Pap smear 09/2006   HGSIL CIN 2/VAIN/ CIN 3/VAIN-3 CIS  . CHF (congestive heart failure) (HCC)   . COPD (chronic obstructive pulmonary disease) (HCC)   . Foot fracture, left 10/16   hair-line fracture  . History of pneumonia 02/09/2016  . Interstitial lung disease (HCC)   . Pneumonia    was hospitalized  . RA (rheumatoid arthritis) (HCC) 07/2009    Allergies Allergies  Allergen Reactions  . Bee Venom Anaphylaxis  . Benadryl Allergy [Diphenhydramine Hcl]     Went into afib  . Codeine Other (See Comments)    "seeing spots"  . Pirfenidone Other (See Comments)    GI side effects  . Penicillins Rash    Has patient had a PCN reaction causing immediate rash, facial/tongue/throat swelling, SOB or lightheadedness with hypotension: No Has patient had a PCN reaction causing severe rash involving mucus membranes or skin necrosis: No Has patient had a PCN reaction that required hospitalization No Has patient had a PCN reaction occurring within the last 10 years: Yes If all of the above answers are "NO", then may proceed with Cephalosporin use.     IV Location/Drains/Wounds Patient Lines/Drains/Airways Status    Active Line/Drains/Airways    Name Placement date Placement time Site Days   Peripheral IV 12-Aug-2020  Right Forearm 08/31/2020  2315  Forearm  2   Incision (Closed) 12/15/15 Other (Comment) Other (Comment) 12/15/15  0800   1702   Incision (Closed) 06/10/20 N/A Other (Comment) 06/10/20  1240   63   Incision - 4 Ports Abdomen Right;Lateral Right;Medial Umbilicus Left;Upper 06/10/20  1206   63          Labs/Imaging Results for orders placed or performed during the hospital encounter of 08/16/2020 (from the past 48 hour(s))  Blood gas, arterial     Status: Abnormal   Collection Time: 08/08/2020 11:29  PM  Result Value Ref Range   FIO2 100.00    O2 Content 15.0 L/min   pH, Arterial 7.399 7.35 - 7.45   pCO2 arterial 38.3 32 - 48 mmHg   pO2, Arterial 76.9 (L) 83 - 108 mmHg   Bicarbonate 23.2 20.0 - 28.0 mmol/L   Acid-base deficit 0.9 0.0 - 2.0 mmol/L   O2 Saturation 93.8 %   Patient temperature 98.6    Collection site LEFT RADIAL    Drawn by [id number]    Allens test (pass/fail) PASS PASS    Comment: Performed at [facility], [address] [place], [place] [address]  CBC with Differential/Platelet     Status: Abnormal   Collection Time: 08/06/2020 11:29 PM  Result Value Ref Range   WBC 22.1 (H) 4.0 - 10.5 K/uL   RBC 4.06 3.87 - 5.11 MIL/uL   Hemoglobin 9.5 (L) 12.0 - 15.0 g/dL   HCT 36.6 (L) 36 - 46 %   MCV 80.3 80.0 - 100.0 fL   MCH 23.4 (L) 26.0 - 34.0 pg   MCHC 29.1 (L) 30.0 - 36.0 g/dL   RDW 44.0 (H) 34.7 - 42.5 %   Platelets 489 (H) 150 - 400 K/uL   nRBC 0.0 0.0 - 0.2 %   Neutrophils Relative % 84 %   Neutro Abs 18.4 (H) 1.7 - 7.7 K/uL   Lymphocytes Relative 6 %   Lymphs Abs 1.4 0.7 - 4.0 K/uL   Monocytes Relative 4 %   Monocytes Absolute 1.0 0.1 - 1.0 K/uL   Eosinophils Relative 4 %   Eosinophils Absolute 0.9 (H) 0.0 - 0.5 K/uL   Basophils Relative 0 %   Basophils Absolute 0.1 0.0 - 0.1 K/uL   Immature Granulocytes 2 %   Abs Immature Granulocytes 0.43 (H) 0.00 - 0.07 K/uL    Comment: Performed at [facility], [address] [place], [place] [address]  Comprehensive metabolic panel     Status: Abnormal   Collection Time: 08/31/2020 11:29 PM  Result Value Ref Range   Sodium 134 (L) 135 - 145 mmol/L   Potassium 3.7 3.5 - 5.1 mmol/L   Chloride 98 98 - 111 mmol/L   CO2 22 22 - 32 mmol/L   Glucose, Bld 139 (H) 70 - 99 mg/dL    Comment: Glucose reference range applies only to samples taken after fasting for at least 8 hours.   BUN 10 6 - 20 mg/dL   Creatinine, Ser 7.56 0.44 - 1.00 mg/dL   Calcium 7.9 (L) 8.9 - 10.3 mg/dL   Total  Protein 6.4 (L) 6.5 - 8.1 g/dL   Albumin 2.3 (L) 3.5 - 5.0 g/dL   AST 20 15 - 41 U/L   ALT 13 0 - 44 U/L   Alkaline Phosphatase 158 (H) 38 - 126 U/L   Total Bilirubin 0.5 0.3 - 1.2 mg/dL   GFR, Estimated >43 >  60 mL/min    Comment: (NOTE) Calculated using the CKD-EPI Creatinine Equation (2021)    Anion gap 14 5 - 15    Comment: Performed at [facility], [address], [place], [place] [address]  Brain natriuretic peptide     Status: Abnormal   Collection Time: 08/29/2020 11:29 PM  Result Value Ref Range   B Natriuretic Peptide 148.7 (H) 0.0 - 100.0 pg/mL    Comment: Performed at [facility], [address], [place], [place] [address]  Resp Panel by RT-PCR (Flu A&B, Covid) Nasopharyngeal Swab     Status: None   Collection Time: 08/24/2020 11:29 PM   Specimen: Nasopharyngeal Swab; Nasopharyngeal(NP) swabs in vial transport medium  Result Value Ref Range   SARS Coronavirus 2 by RT PCR NEGATIVE NEGATIVE    Comment: (NOTE) SARS-CoV-2 target nucleic acids are NOT DETECTED.  The SARS-CoV-2 RNA is generally detectable in upper respiratory specimens during the acute phase of infection. The lowest concentration of SARS-CoV-2 viral copies this assay can detect is 138 copies/mL. A negative result does not preclude SARS-Cov-2 infection and should not be used as the sole basis for treatment or other patient management decisions. A negative result may occur with  improper specimen collection/handling, submission of specimen other than nasopharyngeal swab, presence of viral mutation(s) within the areas targeted by this assay, and inadequate number of viral copies(<138 copies/mL). A negative result must be combined with clinical observations, patient history, and epidemiological information. The expected result is Negative.  Fact Sheet for Patients:  BloggerCourse.com  Fact Sheet for Healthcare Providers:   SeriousBroker.it  This test is no t yet approved or cleared by the [place] FDA and  has been authorized for detection and/or diagnosis of SARS-CoV-2 by FDA under an Emergency Use Authorization (EUA). This EUA will remain  in effect (meaning this test can be used) for the duration of the COVID-19 declaration under Section 564(b)(1) of the Act, 21 U.S.C.section 360bbb-3(b)(1), unless the authorization is terminated  or revoked sooner.       Influenza A by PCR NEGATIVE NEGATIVE   Influenza B by PCR NEGATIVE NEGATIVE    Comment: (NOTE) The Xpert Xpress SARS-CoV-2/FLU/RSV plus assay is intended as an aid in the diagnosis of influenza from Nasopharyngeal swab specimens and should not be used as a sole basis for treatment. Nasal washings and aspirates are unacceptable for Xpert Xpress SARS-CoV-2/FLU/RSV testing.  Fact Sheet for Patients: BloggerCourse.com  Fact Sheet for Healthcare Providers: SeriousBroker.it  This test is not yet approved or cleared by the [place] FDA and has been authorized for detection and/or diagnosis of SARS-CoV-2 by FDA under an Emergency Use Authorization (EUA). This EUA will remain in effect (meaning this test can be used) for the duration of the COVID-19 declaration under Section 564(b)(1) of the Act, 21 U.S.C. section 360bbb-3(b)(1), unless the authorization is terminated or revoked.  Performed at [facility], [address] [place], [place] [address]   Basic metabolic panel     Status: Abnormal   Collection Time: 08/11/20  7:34 AM  Result Value Ref Range   Sodium 136 135 - 145 mmol/L   Potassium 3.9 3.5 - 5.1 mmol/L   Chloride 95 (L) 98 - 111 mmol/L   CO2 28 22 - 32 mmol/L   Glucose, Bld 171 (H) 70 - 99 mg/dL    Comment: Glucose reference range applies only to samples taken after fasting for at least 8 hours.   BUN 12  6 - 20 mg/dL    Creatinine, Ser 1.95 0.44 - 1.00 mg/dL   Calcium 8.5 (L) 8.9 - 10.3 mg/dL   GFR, Estimated >09 >32 mL/min    Comment: (NOTE) Calculated using the CKD-EPI Creatinine Equation (2021)    Anion gap 13 5 - 15    Comment: Performed at [facility], [address], [place], [place] [address]  CBC     Status: Abnormal   Collection Time: 08/11/20  7:34 AM  Result Value Ref Range   WBC 19.9 (H) 4.0 - 10.5 K/uL   RBC 4.01 3.87 - 5.11 MIL/uL   Hemoglobin 9.6 (L) 12.0 - 15.0 g/dL   HCT 58.0 (L) 36 - 46 %   MCV 80.0 80.0 - 100.0 fL   MCH 23.9 (L) 26.0 - 34.0 pg   MCHC 29.9 (L) 30.0 - 36.0 g/dL   RDW 99.8 (H) 33.8 - 25.0 %   Platelets 415 (H) 150 - 400 K/uL   nRBC 0.0 0.0 - 0.2 %    Comment: Performed at [facility], [address] [place], [place] [address]  Magnesium     Status: Abnormal   Collection Time: 08/11/20  7:34 AM  Result Value Ref Range   Magnesium 2.6 (H) 1.7 - 2.4 mg/dL    Comment: Performed at [facility], [address], [place], [place] [address]  Procalcitonin - Baseline     Status: None   Collection Time: 08/11/20  7:34 AM  Result Value Ref Range   Procalcitonin 0.26 ng/mL    Comment:        Interpretation: PCT (Procalcitonin) <= 0.5 ng/mL: Systemic infection (sepsis) is not likely. Local bacterial infection is possible. (NOTE)       Sepsis PCT Algorithm           Lower Respiratory Tract                                      Infection PCT Algorithm    ----------------------------     ----------------------------         PCT < 0.25 ng/mL                PCT < 0.10 ng/mL          Strongly encourage             Strongly discourage   discontinuation of antibiotics    initiation of antibiotics    ----------------------------     -----------------------------       PCT 0.25 - 0.50 ng/mL            PCT 0.10 - 0.25 ng/mL               OR       >80% decrease in PCT            Discourage initiation of                                             antibiotics      Encourage discontinuation           of antibiotics    ----------------------------     -----------------------------         PCT >= 0.50 ng/mL  PCT 0.26 - 0.50 ng/mL               AND        <80% decrease in PCT             Encourage initiation of                                             antibiotics       Encourage continuation           of antibiotics    ----------------------------     -----------------------------        PCT >= 0.50 ng/mL                  PCT > 0.50 ng/mL               AND         increase in PCT                  Strongly encourage                                      initiation of antibiotics    Strongly encourage escalation           of antibiotics                                     -----------------------------                                           PCT <= 0.25 ng/mL                                                 OR                                        > 80% decrease in PCT                                      Discontinue / Do not initiate                                             antibiotics  Performed at [facility], [address], [place], [place] [address]    DG Chest Port 1 View  Result Date: 08/13/2020 CLINICAL DATA:  Short of breath, cough EXAM: PORTABLE CHEST 1 VIEW COMPARISON:  08/04/2020 FINDINGS: Single frontal view of the chest demonstrates a stable cardiac silhouette. Multifocal bilateral areas of ground-glass airspace disease there are again noted, superimposed upon background scarring and fibrosis. No effusion or pneumothorax. No acute bony abnormalities. IMPRESSION: 1. Multifocal pneumonia  versus edema, not appreciably changed since prior study. Electronically Signed   By: Sharlet Salina M.D.   On: 09/02/2020 23:48    Pending Labs Unresulted Labs (From admission, onward)          Start     Ordered   20-Aug-2020 0500  Creatinine, serum  (enoxaparin  (LOVENOX)    CrCl >/= 30 ml/min)  Weekly,   R     Comments: while on enoxaparin therapy    08/11/20 0156   08/12/20 0500  Procalcitonin  Daily,   R      08/11/20 0943   08/11/20 1046  Fungitell, Serum  Add-on,   AD        08/11/20 1045   08/11/20 0500  Basic metabolic panel  Daily,   R      08/11/20 0156   08/11/20 0500  CBC  Daily,   R      08/11/20 0156          Vitals/Pain Today's Vitals   08/11/20 2330 08/11/20 2345 08/12/20 0000 08/12/20 0030  BP: (!) 100/58  131/64 (!) 98/59  Pulse: 83  82 82  Resp: 16  19 19   Temp:      TempSrc:      SpO2: 100%  97% 97%  Weight:      Height:      PainSc:  3  0-No pain     Isolation Precautions No active isolations  Medications Medications  enoxaparin (LOVENOX) injection 40 mg (40 mg Subcutaneous Given 08/11/20 0953)  furosemide (LASIX) injection 20 mg (20 mg Intravenous Given 08/11/20 1806)  0.9 %  sodium chloride infusion (has no administration in time range)  acetaminophen (TYLENOL) tablet 650 mg (650 mg Oral Given 08/11/20 2215)    Or  acetaminophen (TYLENOL) suppository 650 mg ( Rectal See Alternative 08/11/20 2215)  senna-docusate (Senokot-S) tablet 1 tablet (has no administration in time range)  ondansetron (ZOFRAN) tablet 4 mg (has no administration in time range)    Or  ondansetron (ZOFRAN) injection 4 mg (has no administration in time range)  fluconazole (DIFLUCAN) tablet 100 mg (100 mg Oral Given 08/11/20 0951)  pantoprazole (PROTONIX) EC tablet 40 mg (40 mg Oral Given 08/11/20 2215)  albuterol (PROVENTIL) (2.5 MG/3ML) 0.083% nebulizer solution 2.5 mg (has no administration in time range)  ipratropium-albuterol (DUONEB) 0.5-2.5 (3) MG/3ML nebulizer solution 3 mL (3 mLs Nebulization Given 08/11/20 1826)  hydroxychloroquine (PLAQUENIL) tablet 200 mg (200 mg Oral Given 08/11/20 2215)  methylPREDNISolone sodium succinate (SOLU-MEDROL) 125 mg/2 mL injection 125 mg (125 mg Intravenous Given 08/11/20 2215)  ceFEPIme (MAXIPIME) 2 g  in sodium chloride 0.9 % 100 mL IVPB (0 g Intravenous Stopped 08/11/20 1958)  ALPRAZolam (XANAX) tablet 0.25 mg (0.25 mg Oral Given 08/11/20 1813)  vancomycin (VANCOREADY) IVPB 750 mg/150 mL (has no administration in time range)  furosemide (LASIX) injection 40 mg (40 mg Intravenous Given 08/11/20 0045)  vancomycin (VANCOREADY) IVPB 1250 mg/250 mL (0 mg Intravenous Stopped 08/11/20 1958)    Mobility walks

## 2020-08-12 NOTE — Progress Notes (Addendum)
NAME:  Kimberly Shelton, MRN:  [id number], DOB:  1960-10-18, LOS: 1 ADMISSION DATE:  2020-08-11, CONSULTATION DATE:  12/6  REFERRING MD:  Hanley Ben, CHIEF COMPLAINT:  hypoxia  Brief History   59 y/o F who presented 12/5 with progressively worsening shortness of breath.  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  Recent admission 11/29-12/1 for new hypoxic respiratory failure and treated with antibiotics for possible PNA.  After discharge, she had progressive SOB, presented to [facility] on 12/5 with sats in the 70's on her 4L baseline.    During her recent admission she was not given steroids due to concern for duodenal ulcers discovered in the past few months.  She has remote history of cavitary pneumococcal pneumonia and subsequently developed an aspergilloma.  She was treated with long-term voriconazole with resolution.  Until about 6 weeks ago when she was admitted for acute cholecystitis she had no limiting shortness of breath.  Past Medical History  Rheumatoid arthritis UIP Aspergilloma  Significant Hospital Events     Consults:  PCCM  Procedures:    Significant Diagnostic Tests:  CXR 11/5 >> worsening bilateral infiltrates, lower lobe predominant  Micro Data:  COVID 12/5 >> negative Influenza A/B 12/5 >> negative   MRSA PCR 12/7 >> negative  PCT 12/6 >> 0.26  Antimicrobials:  Vanc 12/6 >> 12/7 Cefepime 12/6 >>  Interim history/subjective:  On 15L HFNC  Afebrile / WBC 22.8 I/O 500 ml UOP, -110ml in last 24 hours  Pt reports she has intermittent periods of anxiety with thoughts of having to exert herself.  Ok at rest.  States Xanax is helping calm her.     Objective   Blood pressure 128/61, pulse (!) 112, temperature 97.9 F (36.6 C), temperature source Oral, resp. rate (!) 29, height 5\' 3"  (1.6 m), weight 58.5 kg, last menstrual period 09/06/2004, SpO2 91 %.        Intake/Output Summary (Last 24 hours) at 08/12/2020  1242 Last data filed at 08/12/2020 1100 Gross per 24 hour  Intake 450.1 ml  Output 1325 ml  Net -874.9 ml   Filed Weights   08-11-20 2318 08/12/20 0123  Weight: 62.9 kg 58.5 kg    Examination: General: adult female lying in bed in NAD HEENT: MM pink/dry, no jvd, [place] O2 15L [place], anicteric  Neuro: AAOx4, speech clear, MAE  CV: s1s2 RRR, no m/r/g PULM: tachypnea but non-labored, lungs bilaterally with fine bibasilar crackles  GI: soft, bsx4 active  Extremities: warm/dry, no edema  Skin: no rashes or lesions  Resolved Hospital Problem list     Assessment & Plan:   Acute on Chronic Hypoxic Respiratory Failure RA related UIP  Suspect progressive hypoxic failure and recent recurrent admissions most likely due to flare of UIP. She has been treated for infection.   -continue solumedrol 125 mg IV Q6 -high dose PPI with steroids with hx of recent ulcers  -respiratory culture if possible  -xanax as needed for anxiety  -Palliative Care consult recommended  -if this is flare of UIP and not reversible process, she is aware of poor prognosis -she is intolerant to antifibrotics  -DNR/DNI  -discussed with RN to add additional O2 to anticipate desaturation with exertion.  She will desaturate and be slow to recover. Encouraged her not to focus on the number but rather how she feels.   -reviewed pursed lip breathing technique with patient  -supportive care  -continue empiric abx, stop vanco given  negative PCR and monitor   Hypokalemia  -monitor, replaced 12/7  -add 20 mEq KCL QD while on lasix, use oral for replacement as patient is able to swallow / received IV overnight   Chronic anemia -follow CBC  -transfuse for Hgb <7% or active bleeding    Best practice (evaluated daily)   Per primary  Labs   CBC: Recent Labs  Lab 08/06/20 0418 08/25/2020 2329 08/11/20 0734 08/12/20 0252  WBC 27.1* 22.1* 19.9* 22.8*  NEUTROABS  --  18.4*  --   --   HGB 10.1* 9.5* 9.6* 9.3*  HCT  34.0* 32.6* 32.1* 31.8*  MCV 81.7 80.3 80.0 79.3*  PLT 674* 489* 415* 440*    Basic Metabolic Panel: Recent Labs  Lab 08/06/20 0418 2020/08/25 2329 08/11/20 0734 08/12/20 0252  NA 137 134* 136 136  K 3.6 3.7 3.9 3.4*  CL 97* 98 95* 95*  CO2 27 22 28 28   GLUCOSE 127* 139* 171* 157*  BUN 17 10 12 18   CREATININE 0.68 0.57 0.64 0.69  CALCIUM 9.1 7.9* 8.5* 8.7*  MG 2.2  --  2.6*  --    GFR: Estimated Creatinine Clearance: 61.9 mL/min (by C-G formula based on SCr of 0.69 mg/dL). Recent Labs  Lab 08/06/20 0418 25-Aug-2020 2329 08/11/20 0734 08/12/20 0252  PROCALCITON  --   --  0.26 0.17  WBC 27.1* 22.1* 19.9* 22.8*    Liver Function Tests: Recent Labs  Lab 2020/08/25 2329  AST 20  ALT 13  ALKPHOS 158*  BILITOT 0.5  PROT 6.4*  ALBUMIN 2.3*   No results for input(s): LIPASE, AMYLASE in the last 168 hours. No results for input(s): AMMONIA in the last 168 hours.  ABG    Component Value Date/Time   PHART 7.399 08/25/20 2329   PCO2ART 38.3 2020-08-25 2329   PO2ART 76.9 (L) 08-25-20 2329   HCO3 23.2 08/25/20 2329   ACIDBASEDEF 0.9 25-Aug-2020 2329   O2SAT 93.8 08-25-2020 2329     Coagulation Profile: No results for input(s): INR, PROTIME in the last 168 hours.  Cardiac Enzymes: No results for input(s): CKTOTAL, CKMB, CKMBINDEX, TROPONINI in the last 168 hours.  HbA1C: No results found for: HGBA1C  CBG: No results for input(s): GLUCAP in the last 168 hours.   14/01/2020, MSN, NP-C, AGACNP-BC [facility] 08/12/2020, 12:43 PM   Please see [email or web address] for pager details.

## 2020-08-13 DIAGNOSIS — Z515 Encounter for palliative care: Secondary | ICD-10-CM

## 2020-08-13 DIAGNOSIS — J8489 Other specified interstitial pulmonary diseases: Secondary | ICD-10-CM

## 2020-08-13 DIAGNOSIS — J431 Panlobular emphysema: Secondary | ICD-10-CM

## 2020-08-13 DIAGNOSIS — M359 Systemic involvement of connective tissue, unspecified: Secondary | ICD-10-CM

## 2020-08-13 LAB — CBC
HCT: 32.7 % — ABNORMAL LOW (ref 36.0–46.0)
Hemoglobin: 9.5 g/dL — ABNORMAL LOW (ref 12.0–15.0)
MCH: 23.5 pg — ABNORMAL LOW (ref 26.0–34.0)
MCHC: 29.1 g/dL — ABNORMAL LOW (ref 30.0–36.0)
MCV: 80.9 fL (ref 80.0–100.0)
Platelets: 428 10*3/uL — ABNORMAL HIGH (ref 150–400)
RBC: 4.04 MIL/uL (ref 3.87–5.11)
RDW: 17.6 % — ABNORMAL HIGH (ref 11.5–15.5)
WBC: 20.6 10*3/uL — ABNORMAL HIGH (ref 4.0–10.5)
nRBC: 0.1 % (ref 0.0–0.2)

## 2020-08-13 LAB — BASIC METABOLIC PANEL
Anion gap: 11 (ref 5–15)
BUN: 23 mg/dL — ABNORMAL HIGH (ref 6–20)
CO2: 26 mmol/L (ref 22–32)
Calcium: 8.6 mg/dL — ABNORMAL LOW (ref 8.9–10.3)
Chloride: 99 mmol/L (ref 98–111)
Creatinine, Ser: 0.53 mg/dL (ref 0.44–1.00)
GFR, Estimated: >60 mL/min (ref >60)
Glucose, Bld: 122 mg/dL — ABNORMAL HIGH (ref 70–99)
Potassium: 4.4 mmol/L (ref 3.5–5.1)
Sodium: 136 mmol/L (ref 135–145)

## 2020-08-13 LAB — MAGNESIUM: Magnesium: 2.1 mg/dL (ref 1.7–2.4)

## 2020-08-13 LAB — PROCALCITONIN: Procalcitonin: 0.1 ng/mL

## 2020-08-13 LAB — PHOSPHORUS: Phosphorus: 3 mg/dL (ref 2.5–4.6)

## 2020-08-13 MED ORDER — MORPHINE SULFATE (PF) 2 MG/ML IV SOLN
1.0000 mg | INTRAVENOUS | Status: DC | PRN
  Administered 2020-08-14: 1 mg via INTRAVENOUS
  Administered 2020-08-14 – 2020-08-18 (×6): 2 mg via INTRAVENOUS
  Filled 2020-08-13 (×7): qty 1

## 2020-08-13 NOTE — Telephone Encounter (Signed)
Called and spoke with Danford Bad  We sent order for skilled nursing and she is calling to verify- I advised proceed with order   She states that when she seen pt a few days ago pt still has anxiety and wants her xanax dose increased  It looks like she is in the hospital now and on her med list it now states 0.5 md tid prn  Do you want to her have continue it this way? Please advise, thanks!

## 2020-08-13 NOTE — Progress Notes (Signed)
Pharmacy Antibiotic Note  Kimberly Shelton is a 59 y.o. female with hx COPD, ILD and aspergilloma and strep pneumo pneumonia recently hospitalized for COPD exacerbation and suspected PNA.  She was discharged on 12/01 with a prescription for levaquin. She presented to the ED on 08/16/2020 with c/o SOB.  Pharmacy is consulted to dose cefepime for suspected PNA.  Today, 08/13/2020: SCr 0.5, CrCl > 60 ml/min WBC elevated (steroids) PCT decreased to 0.1 Afebrile  Plan: - Continue Cefepime 2gm IV q8h - complete 7 days per Dr. Chestine Spore - pharmacy will sign off notes, but will follow peripherally  __________________________________  Height: 5\' 3"  (160 cm) Weight: 59.5 kg (131 lb 2.8 oz) IBW/kg (Calculated) : 52.4  Temp (24hrs), Avg:97.7 F (36.5 C), Min:97.1 F (36.2 C), Max:98.4 F (36.9 C)  Recent Labs  Lab 08/11/2020 2329 08/11/20 0734 08/12/20 0252 08/13/20 0301  WBC 22.1* 19.9* 22.8* 20.6*  CREATININE 0.57 0.64 0.69 0.53    Estimated Creatinine Clearance: 61.9 mL/min (by C-G formula based on SCr of 0.53 mg/dL).    Allergies  Allergen Reactions  . Bee Venom Anaphylaxis  . Benadryl Allergy [Diphenhydramine Hcl]     Went into afib  . Codeine Other (See Comments)    "seeing spots"  . Pirfenidone Other (See Comments)    GI side effects  . Penicillins Rash    Has patient had a PCN reaction causing immediate rash, facial/tongue/throat swelling, SOB or lightheadedness with hypotension: No Has patient had a PCN reaction causing severe rash involving mucus membranes or skin necrosis: No Has patient had a PCN reaction that required hospitalization No Has patient had a PCN reaction occurring within the last 10 years: Yes If all of the above answers are "NO", then may proceed with Cephalosporin use.    Antimicrobials this admission:  12/6 vanc>>12/7 12/6 cefepime>> 12/13 12/3 Diflucan (thrush) > 12/10  Dose adjustments this admission:   Microbiology results:  12/6 MRSA PCR:  neg  Thank you for allowing pharmacy to be a part of this patient's care.  14/6 PharmD, BCPS Clinical Pharmacist [facility] main pharmacy [phone] 08/13/2020 9:33 AM

## 2020-08-13 NOTE — Telephone Encounter (Signed)
I met with patient on date of admit 12//6/21 in ER at request of patient and DR Carlis Abbott of PCCM.She is much more hypoxemic. Needing 12L [place]. She looks like she has lost weight. AT rest looked stable but even slightest movement was desaturating. She expressed fear over possibility of shortered life expectancy. She felt Jesus was there. She said she wanted to fight through this and she wants to live longer. She wanted me to visit with her during hospitalization for suport and encouragement. Joined her in prayer. She agreed to no ventilator and no CPR  I also spoke to daughter . Daughter is in [place]. [place] but will visit. Daughter is only child. Daughter is the [place]. She directed me to have all conversations only with patient and daughter and not with Vaughan Basta and other sisters. Explained IPF flare and high mortality situatioon. She plans to visit  Also dw Dr Carlis Abbott - give empiric trial of steroids given worsening and CT c/w Flare. Patient cautioned about risk of bleeding and [place] with Rx with ppi    SIGNATURE    Dr. Brand Males, M.D., F.C.C.P,  Pulmonary and Critical Care Medicine Staff Physician, [facility] Director - Interstitial Lung Disease  Program  P[facility] [place], [place], [address]  Pager: [phone], If no answer  OR between  19:00-7:00h: page [phone] Telephone (clinical office): [phone] Telephone (research): [phone]  5:22 AM 08/13/2020

## 2020-08-13 NOTE — Consult Note (Signed)
Consultation Note Date: 08/13/2020   Patient Name: [place]  DOB: [birth year]  MRN: [id number]  Age / Sex: 59 y.o., female  PCP: [place]ly Covey, MD Referring Physician: Drema Dallas, MD  Reason for Consultation: Establishing goals of care  HPI/Patient Profile: 59 y.o. female   admitted on 08/17/2020    [place] is a 59 y/o woman with a history of RA and UIP admitted with an acute flare of UIP, patient sees pulmonology in the outpatient setting.   Clinical Assessment and Goals of Care: 59 year old lady with a past medical history significant for rheumatoid arthritis, also has UIP, admitted with progressively worsening shortness of breath, UIP flare.  History of duodenal ulcers discovered in the past few months.  Also has remote history of cavitary pneumococcal pneumonia.  Patient remains admitted to [facility] [place] [facility] [place]  She has been followed by pulmonary critical care specialist.  She is on 12 L of oxygen via nasal cannula currently.  She states she is undergoing a trial.  Of steroids and antibiotics.  Palliative consult for ongoing goals of care discussions has been requested.  I introduced myself and palliative care as follows: Palliative medicine is specialized medical care for people living with serious illness. It focuses on providing relief from the symptoms and stress of a serious illness. The goal is to improve quality of life for both the patient and the family.  Goals of care: Broad aims of medical therapy in relation to the patient's values and preferences. Our aim is to provide medical care aimed at enabling patients to achieve the goals that matter most to them, given the circumstances of their particular medical situation and their constraints.   Patient asks for me to pray for her.  She states that she is breathing a little bit  better today.  She states she is out of bed and is able to eat without getting short of breath today.  She is aware that she is on 12 L oxygen right now.  She hopes for some degree of ongoing stabilization/recovery.  She states that she has been started on Xanax for anxiety which is helping.  We discussed about neck steps in this hospitalization as well as discussed briefly about disposition options.  Offered active listening and supportive care.  See below.  NEXT [place] KIN  [place] she has siblings nearby, also has a daughter.   SUMMARY OF RECOMMENDATIONS    Agree with DNR Recommend SNF rehab with palliative care.  Continue current mode of care.   Code Status/Advance Care Planning:  DNR    Symptom Management:      Palliative Prophylaxis:   Delirium Protocol   Psycho-social/Spiritual:   Desire for further Chaplaincy support:yes  Additional Recommendations: Caregiving  Support/Resources  Prognosis:   Unable to determine  Discharge Planning: Skilled Nursing Facility for rehab with Palliative care service follow-up      Primary Diagnoses: Present on Admission: . Acute on chronic respiratory  failure with hypoxia (HCC) . Acute on chronic diastolic CHF (congestive heart failure) (HCC) . Rheumatoid arthritis involving multiple joints (HCC) . Interstitial lung disease due to connective tissue disease (HCC) . COPD (chronic obstructive pulmonary disease) (HCC) . Normocytic anemia . Thrombocytosis . Duodenal ulcer: Per EGD 06/15/2020   I have reviewed the medical record, interviewed the patient and family, and examined the patient. The following aspects are pertinent.  Past Medical History:  Diagnosis Date  . Abnormal Pap smear 09/2006   HGSIL CIN 2/VAIN/ CIN 3/VAIN-3 CIS  . CHF (congestive heart failure) (HCC)   . COPD (chronic obstructive pulmonary disease) (HCC)   . Foot fracture, left 10/16   hair-line fracture  . History of pneumonia 02/09/2016  .  Interstitial lung disease (HCC)   . Pneumonia    was hospitalized  . RA (rheumatoid arthritis) (HCC) 07/2009   Social History   Socioeconomic History  . Marital status: Divorced    Spouse name: Not on file  . Number of children: 1  . Years of education: Not on file  . Highest education [place]: Not on file  Occupational History  . Occupation: Stage manager: [employer]  Tobacco Use  . Smoking status: Former Smoker    Packs/day: 1.00    Years: 30.00    Pack years: 30.00    Types: Cigarettes, E-cigarettes    Start date: 1985    Quit date: 02/16/2014    Years since quitting: 6.4  . Smokeless tobacco: Never Used  Vaping Use  . Vaping Use: Former  Substance and Sexual Activity  . Alcohol use: No    Alcohol/week: 0.0 standard drinks  . Drug use: No  . Sexual activity: Not Currently    Partners: Male    Birth control/protection: Post-menopausal  Other Topics Concern  . 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 Difficulty of Paying Living Expenses: Not on file  Food Insecurity:   . Worried About Programme researcher, broadcasting/film/video in the Last Year: Not on file  . Ran Out of Food in the Last Year: Not on file  Transportation Needs:   . Lack of Transportation (Medical): Not on file  . Lack of Transportation (Non-Medical): Not on file  Physical Activity:   . Days of Exercise per Week: Not on file  . Minutes of Exercise per Session: Not on file  Stress:   . Feeling of Stress : Not on file  Social Connections:   . Frequency of Communication with Friends and Family: Not on file  . Frequency of Social Gatherings with Friends and Family: Not on file  . Attends Religious Services: Not on file  . Active Member of Clubs or Organizations: Not on file  . Attends Banker Meetings: Not on file  . Marital Status: Not on file   Family History  Problem Relation Age of  Onset  . Diabetes Mother   . Rheum arthritis Mother   . Diabetes Father   . COPD Father   . Bronchitis Father   . Cancer Brother        Lung  . Breast cancer Maternal Aunt 51  . CAD Neg Hx    Scheduled Meds: . Chlorhexidine Gluconate Cloth  6 each Topical Daily  . enoxaparin (LOVENOX) injection  40 mg Subcutaneous Q24H  . feeding supplement  237  mL Oral BID BM  . fluconazole  100 mg Oral Daily  . hydroxychloroquine  200 mg Oral BID  . ipratropium-albuterol  3 mL Nebulization TID  . mouth rinse  15 mL Mouth Rinse BID  . methylPREDNISolone (SOLU-MEDROL) injection  125 mg Intravenous Q6H  . multivitamin with minerals  1 tablet Oral Daily  . pantoprazole  40 mg Oral BID   Continuous Infusions: . sodium chloride    . ceFEPime (MAXIPIME) IV Stopped (08/13/20 1048)   PRN Meds:.sodium chloride, acetaminophen **OR** acetaminophen, albuterol, ALPRAZolam, ondansetron **OR** ondansetron (ZOFRAN) IV, senna-docusate, sodium chloride Medications Prior to Admission:  Prior to Admission medications   Medication Sig Start Date End Date Taking? Authorizing Provider  acetaminophen (TYLENOL) 325 MG tablet Take 2 tablets (650 mg total) by mouth every 6 (six) hours as needed for mild pain (or Fever >/= 101). 06/12/20  Yes Trixie Deis R, PA-C  albuterol (PROVENTIL) (2.5 MG/3ML) 0.083% nebulizer solution Take 3 mLs (2.5 mg total) by nebulization every 2 (two) hours as needed for shortness of breath. 08/06/20  Yes Aquilla Hacker, MD  albuterol (VENTOLIN HFA) 108 (90 Base) MCG/ACT inhaler INHALE 2 PUFFS INTO THE LUNGS EVERY 6 HOURS AS NEEDED FOR WHEEZING OR SHORTNESS OF BREATH 08/06/20  Yes Aquilla Hacker, MD  ALPRAZolam Prudy Feeler) 0.25 MG tablet Take 1 tablet (0.25 mg total) by mouth daily as needed for anxiety. 08/08/20  Yes Kalman Shan, MD  benzonatate (TESSALON) 200 MG capsule Take 200 mg by mouth in the morning, at noon, and at bedtime.   Yes [provider]  docusate sodium (COLACE)  100 MG capsule Take 1 capsule (100 mg total) by mouth daily as needed. 08/06/20 08/06/21 Yes Aquilla Hacker, MD  EPINEPHrine (EPIPEN 2-PAK) 0.3 mg/0.3 mL IJ SOAJ injection Inject 0.3 mg into the muscle as needed for anaphylaxis.    Yes [provider]  escitalopram (LEXAPRO) 10 MG tablet Take 1 tablet (10 mg total) by mouth daily. 08/06/20  Yes Aquilla Hacker, MD  ferrous sulfate 325 (65 FE) MG EC tablet Take 1 tablet (325 mg total) by mouth 2 (two) times daily. 08/06/20 09/05/20 Yes Aquilla Hacker, MD  fluconazole (DIFLUCAN) 100 MG tablet 2 tabs the first day, then 1 daily until gone 08/08/20  Yes Kalman Shan, MD  hydroxychloroquine (PLAQUENIL) 200 MG tablet Take 1 tablet (200 mg total) by mouth 2 (two) times daily. 08/06/20  Yes Aquilla Hacker, MD  ipratropium-albuterol (DUONEB) 0.5-2.5 (3) MG/3ML SOLN Take 3 mLs by nebulization 3 (three) times daily. 08/06/20  Yes Aquilla Hacker, MD  Magnesium Oxide 400 MG CAPS Take 1 capsule (400 mg total) by mouth daily for 14 days. 08/06/20 08/20/20 Yes Aquilla Hacker, MD  OXYGEN Inhale 4 L/L into the lungs daily as needed (breathing). continuously   Yes [provider]  pantoprazole (PROTONIX) 40 MG tablet Take 1 tablet (40 mg total) by mouth 2 (two) times daily. 08/06/20 10/05/20 Yes Aquilla Hacker, MD  potassium chloride (KLOR-CON) 10 MEQ tablet Take 1 tablet (10 mEq total) by mouth daily. 08/06/20 09/05/20 Yes Aquilla Hacker, MD  sucralfate (CARAFATE) 1 g tablet Take 1 g by mouth 4 (four) times daily.   Yes [provider]  dimethicone (NEUTRAPHOR) 1 % cream Apply 1 application topically 2 (two) times daily as needed for up to 14 days for dry skin. 08/06/20 08/20/20  Aquilla Hacker, MD  feeding supplement (ENSURE ENLIVE / ENSURE PLUS) LIQD Take 237 mLs  by mouth 2 (two) times daily between meals for 7 days. Patient not taking: Reported on 08/11/2020 08/06/20 08/13/20  Aquilla Hacker, MD  Multiple Vitamin  (MULTIVITAMIN WITH MINERALS) TABS tablet Take 1 tablet by mouth daily. Patient not taking: Reported on 08/11/2020 08/07/20 09/06/20  Aquilla Hacker, MD  ondansetron (ZOFRAN) 4 MG tablet Take 1 tablet (4 mg total) by mouth 3 (three) times daily as needed for nausea. Patient not taking: Reported on 08/04/2020 01/10/20   Kalman Shan, MD  polyethylene glycol (MIRALAX / GLYCOLAX) 17 g packet Take 17 g by mouth daily as needed for up to 14 days for mild constipation. 08/06/20 08/20/20  Aquilla Hacker, MD   Allergies  Allergen Reactions  . Bee Venom Anaphylaxis  . Benadryl Allergy [Diphenhydramine Hcl]     Went into afib  . Codeine Other (See Comments)    "seeing spots"  . Pirfenidone Other (See Comments)    GI side effects  . Penicillins Rash    Has patient had a PCN reaction causing immediate rash, facial/tongue/throat swelling, SOB or lightheadedness with hypotension: No Has patient had a PCN reaction causing severe rash involving mucus membranes or skin necrosis: No Has patient had a PCN reaction that required hospitalization No Has patient had a PCN reaction occurring within the last 10 years: Yes If all of the above answers are "NO", then may proceed with Cephalosporin use.    Review of Systems Some shortness of breath, better today Physical Exam Awake alert sitting up in chair Is thin and has some muscle wasting Fine bibasilar crackles S1-S2 Abdomen is not distended She does not have edema Nonfocal Vital Signs: BP 115/76   Pulse (!) 110   Temp 98.6 F (37 C) (Axillary)   Resp (!) 21   Ht  (1.6 m)   Wt 59.5 kg   LMP 09/06/2004   SpO2 100%   BMI 23.24 kg/m  Pain Scale: 0-10   Pain Score: 0-No pain   SpO2: SpO2: 100 % O2 Device:SpO2: 100 % O2 Flow Rate: .O2 Flow Rate (L/min): 15 L/min  IO: Intake/output summary:   Intake/Output Summary (Last 24 hours) at 08/13/2020 1403 Last data filed at 08/13/2020 1200 Gross per 24 hour  Intake 759.08 ml  Output 700  ml  Net 59.08 ml    LBM: Last BM Date:  (PTA) Baseline Weight: Weight: 62.9 kg Most recent weight: Weight: 59.5 kg     Palliative Assessment/Data:   PPS 40%  Time In:  12 Time Out:  1300 Time Total:  60  Greater than 50%  of this time was spent counseling and coordinating care related to the above assessment and plan.  Signed by: Rosalin Hawking, MD   Please contact Palliative Medicine Team phone at [phone] for questions and concerns.  For individual provider: See Loretha Stapler

## 2020-08-13 NOTE — Telephone Encounter (Signed)
[facility] from [facility]  Is returning [place] call for mutual pt . Please advise [phone]

## 2020-08-13 NOTE — Progress Notes (Signed)
PROGRESS NOTE    Kimberly Shelton  ONG:[id number] DOB: 1961-02-26 DOA: 08/22/2020 PCP: Kristian Covey, MD   Brief Narrative:  59 y.o. WF PMHx  RA-ILD, COPD, HFpEF, depression, anxiety, PUD, and chronic anemia and thrombocytosis,   Presenting to the emergency department for evaluation of shortness of breath and hypoxia.  Patient was discharged from the hospital on 08/06/2020 after treatment for suspected pneumonia, was on 4 L/min of supplemental oxygen at that time.  Since that discharge, she has completed her course of Levaquin, was started on fluconazole for suspected thrush, has developed worsening anxiety and reports oxygen saturation of 80% with slight exertion despite 4 L/min of supplemental oxygen.  She denies any associated chest pain, fevers, or chills.  She denies leg swelling or tenderness but does report some orthopnea.  ED Course: Upon arrival to the ED, patient is found to be afebrile, saturating upper 90s on nonrebreather, tachypneic in the 30s, tachycardic to 130, and with blood pressure as low as 85/53.  EKG features sinus tachycardia with rate 127.  Chest x-ray notable for unchanged multifocal opacities which could reflect pneumonia or edema.  Chemistry panel with normal renal function.  CBC reveals a leukocytosis 22,100, normocytic anemia with hemoglobin 9.5, and thrombocytosis with platelets 489,000.  BNP is elevated 249, up from 90 a week earlier.  Patient was given 40 mg IV Lasix in the ED.   Subjective: Afebrile overnight A/O x4, positive acute on chronic S OB.   Assessment & Plan:  Covid vaccination; vaccinated  Principal Problem:   Acute on chronic respiratory failure with hypoxia (HCC) Active Problems:   Acute on chronic diastolic CHF (congestive heart failure) (HCC)   Rheumatoid arthritis involving multiple joints (HCC)   Interstitial lung disease due to connective tissue disease (HCC)   COPD (chronic obstructive pulmonary disease) (HCC)   Duodenal ulcer:  Per EGD 06/15/2020   Normocytic anemia   Thrombocytosis   Acute on Chronic Diastolic dysfunction  - Patient with ILD, COPD, HFpEF, requiring 4 Lpm supplemental O2 at time of recent discharge presents with increased SOB and saturation of 80% on 4 Lpm and is found to have stable CXR, no fever, and newly elevated BNP  - She was given 40 mg IV Lasix in ED, is diuresing well and reporting some improvement  - Continue diuresis with Lasix 20 mg IV q12h, monitor weight and I/Os   -Strict ins and out -Daily weight  Sinus tachycardia  - HR 120-130 in ED, confirmed sinus on EKG  -Currently NSR.  Sinus tachycardia most likely secondary to her acute on chronic respiratory failure with hypoxia -She was tachycardic during recent admission and ruled-out for PE with CTA on 08/04/20  -She reports improvement in SOB with diuresis and HR now down to 110s; anxiety and decondition likely contributing, will continue current plan and monitor    Acute on Chronic Respiratory failure with hypoxia/COPD/ RA-ILD -States has been seeing Dr. [name] PCCM for 7 years -On Plaquenil for ILD, told to discontinue prednisone at recent discharge -Plaquenil 200 mg BID -Continue 7-day course antibiotics -DuoNeb TID -Solu-Medrol 125 mg QID -Incentive spirometry -Flutter valve -Morphine PRN air hunger  Pulmonary Aspergilloma - Appears stable, no hemoptysis    Rheumatoid arthritis  - Continue Plaquenil    Anemia/ Leukocytosis/Thrombocytosis   - Hgb 9.5 on admission, similar to priors with no active bleeding, she will continue iron supplementation  - Platelets 489k on admission, decreasing from last month and likely reactive  - WBC is 22.1k  on admission without fever, decreased from time of recent discharge at which time she was taken off of prednisone and has since completed a course of Levaquin, will monitor and culture if febrile    PUD   - Continue PPI    Suspected thrush  - Continue fluconazole    Anxiety  - Continue low-dose Xanax as needed   Goals of care -12/8 Palliative Care consult; patient wants to continue with current mode of care.  Agrees with DNR.  Recommends SNF rehab with palliative care -12/8 will have charge nurse call daughter in the a.m. and discuss exact current visitation hours and regulations   DVT prophylaxis: Lovenox Code Status: DNR Family Communication: 12/8 spoke at length with [name] discussed plan of care answered all questions Status is: Inpatient    Dispo: The patient is from: Home              Anticipated d/c is to: SNF vs hospice              Anticipated d/c date is: 12/13              Patient currently unstable      Consultants:  PCCM   Procedures/Significant Events:  CXR 11/5 >> worsening bilateral infiltrates, lower lobe predominant   I have personally reviewed and interpreted all radiology studies and my findings are as above.  VENTILATOR SETTINGS: Nasal cannula 12/8 Flow 12 L/min SPO2 90%   Cultures COVID 12/5 >> negative Influenza A/B 12/5 >> negative   MRSA PCR 12/7 >> negative  PCT 12/6 >> 0.26  Antimicrobials: Anti-infectives (From admission, onward)   Start     Ordered Stop   08/12/20 0500  vancomycin (VANCOREADY) IVPB 750 mg/150 mL  Status:  Discontinued        08/11/20 2113 08/12/20 1322   08/11/20 2300  vancomycin (VANCOREADY) IVPB 750 mg/150 mL  Status:  Discontinued        08/11/20 1025 08/11/20 2113   08/11/20 1030  vancomycin (VANCOREADY) IVPB 1250 mg/250 mL        08/11/20 1025 08/11/20 1958   08/11/20 1030  ceFEPIme (MAXIPIME) 2 g in sodium chloride 0.9 % 100 mL IVPB        08/11/20 1025 09-14-2020 1029   08/11/20 1000  fluconazole (DIFLUCAN) tablet 100 mg        08/11/20 0200 08/16/20 0959   08/11/20 1000  hydroxychloroquine (PLAQUENIL) tablet 200 mg        08/11/20 0210         Devices    LINES / TUBES:      Continuous Infusions: . sodium chloride    . ceFEPime (MAXIPIME) IV  Stopped (08/13/20 0236)     Objective: Vitals:   08/13/20 0451 08/13/20 0500 08/13/20 0600 08/13/20 0700  BP:  135/69 124/69 106/73  Pulse:  96 72 97  Resp:  (!) 30 (!) 28 (!) 22  Temp:      TempSrc:      SpO2:  92% 92% 90%  Weight: 59.5 kg     Height:        Intake/Output Summary (Last 24 hours) at 08/13/2020 0728 Last data filed at 08/13/2020 0700 Gross per 24 hour  Intake 659.08 ml  Output 1525 ml  Net -865.92 ml   Filed Weights   08/14/2020 2318 08/12/20 0123 08/13/20 0451  Weight: 62.9 kg 58.5 kg 59.5 kg    Examination:  General: A/O x4, positive acute on chronic respiratory  distress, cachectic Eyes: negative scleral hemorrhage, negative anisocoria, negative icterus ENT: Negative Runny nose, negative gingival bleeding, Neck:  Negative scars, masses, torticollis, lymphadenopathy, JVD Lungs: tachypneic, decreased breath sounds bilaterally, diffuse crackles, mild expiratory wheeze RIGHT>>> LEFT Cardiovascular: Regular rate and rhythm without murmur gallop or rub normal S1 and S2 Abdomen: negative abdominal pain, nondistended, positive soft, bowel sounds, no rebound, no ascites, no appreciable mass Extremities: No significant cyanosis, clubbing, or edema bilateral lower extremities Skin: Negative rashes, lesions, ulcers Psychiatric:  Negative depression, negative anxiety, negative fatigue, negative mania  Central nervous system:  Cranial nerves II through XII intact, tongue/uvula midline, all extremities muscle strength 5/5, sensation intact throughout, negative dysarthria, negative expressive aphasia, negative receptive aphasia.  .     Data Reviewed: Care during the described time interval was provided by me .  I have reviewed this patient's available data, including medical history, events of note, physical examination, and all test results as part of my evaluation.   CBC: Recent Labs  Lab 09/04/2020 2329 08/11/20 0734 08/12/20 0252 08/13/20 0301  WBC 22.1* 19.9*  22.8* 20.6*  NEUTROABS 18.4*  --   --   --   HGB 9.5* 9.6* 9.3* 9.5*  HCT 32.6* 32.1* 31.8* 32.7*  MCV 80.3 80.0 79.3* 80.9  PLT 489* 415* 440* 428*   Basic Metabolic Panel: Recent Labs  Lab 08/16/2020 2329 08/11/20 0734 08/12/20 0252 08/13/20 0301  NA 134* 136 136 136  K 3.7 3.9 3.4* 4.4  CL 98 95* 95* 99  CO2 GLUCOSE 139* 171* 157* 122*  BUN 23*  CREATININE 0.57 0.64 0.69 0.53  CALCIUM 7.9* 8.5* 8.7* 8.6*  MG  --  2.6*  --  2.1   GFR: Estimated Creatinine Clearance: 61.9 mL/min (by C-G formula based on SCr of 0.53 mg/dL). Liver Function Tests: Recent Labs  Lab 08/31/2020 2329  AST 20  ALT 13  ALKPHOS 158*  BILITOT 0.5  PROT 6.4*  ALBUMIN 2.3*   No results for input(s): LIPASE, AMYLASE in the last 168 hours. No results for input(s): AMMONIA in the last 168 hours. Coagulation Profile: No results for input(s): INR, PROTIME in the last 168 hours. Cardiac Enzymes: No results for input(s): CKTOTAL, CKMB, CKMBINDEX, TROPONINI in the last 168 hours. BNP (last 3 results) No results for input(s): PROBNP in the last 8760 hours. HbA1C: No results for input(s): HGBA1C in the last 72 hours. CBG: No results for input(s): GLUCAP in the last 168 hours. Lipid Profile: No results for input(s): CHOL, HDL, LDLCALC, TRIG, CHOLHDL, LDLDIRECT in the last 72 hours. Thyroid Function Tests: No results for input(s): TSH, T4TOTAL, FREET4, T3FREE, THYROIDAB in the last 72 hours. Anemia Panel: No results for input(s): VITAMINB12, FOLATE, FERRITIN, TIBC, IRON, RETICCTPCT in the last 72 hours. Urine analysis:    Component Value Date/Time   COLORURINE YELLOW 06/09/2020 0117   APPEARANCEUR CLEAR 06/09/2020 0117   LABSPEC 1.031 (H) 06/09/2020 0117   PHURINE 6.0 06/09/2020 0117   GLUCOSEU NEGATIVE 06/09/2020 0117   HGBUR NEGATIVE 06/09/2020 0117   BILIRUBINUR NEGATIVE 06/09/2020 0117   BILIRUBINUR n 07/21/2015 1428   KETONESUR 5 (A) 06/09/2020 0117   PROTEINUR 30 (A)  06/09/2020 0117   UROBILINOGEN negative 07/21/2015 1428   UROBILINOGEN 2.0 (H) 02/16/2014 1226   NITRITE NEGATIVE 06/09/2020 0117   LEUKOCYTESUR NEGATIVE 06/09/2020 0117   Sepsis Labs: (procalcitonin:4,lacticidven:4)  ) Recent Results (from the past 240 hour(s))  Resp Panel by RT-PCR (Flu A&B, Covid)  Nasopharyngeal Swab     Status: None   Collection Time: 08/04/20 12:28 PM   Specimen: Nasopharyngeal Swab; Nasopharyngeal(NP) swabs in vial transport medium  Result Value Ref Range Status   SARS Coronavirus 2 by RT PCR NEGATIVE NEGATIVE Final    Comment: (NOTE) SARS-CoV-2 target nucleic acids are NOT DETECTED.  The SARS-CoV-2 RNA is generally detectable in upper respiratory specimens during the acute phase of infection. The lowest concentration of SARS-CoV-2 viral copies this assay can detect is 138 copies/mL. A negative result does not preclude SARS-Cov-2 infection and should not be used as the sole basis for treatment or other patient management decisions. A negative result may occur with  improper specimen collection/handling, submission of specimen other than nasopharyngeal swab, presence of viral mutation(s) within the areas targeted by this assay, and inadequate number of viral copies(<138 copies/mL). A negative result must be combined with clinical observations, patient history, and epidemiological information. The expected result is Negative.  Fact Sheet for Patients:  BloggerCourse.com  Fact Sheet for Healthcare Providers:  SeriousBroker.it  This test is no t yet approved or cleared by the [place] FDA and  has been authorized for detection and/or diagnosis of SARS-CoV-2 by FDA under an Emergency Use Authorization (EUA). This EUA will remain  in effect (meaning this test can be used) for the duration of the COVID-19 declaration under Section 564(b)(1) of the Act, 21 U.S.C.section 360bbb-3(b)(1), unless the  authorization is terminated  or revoked sooner.       Influenza A by PCR NEGATIVE NEGATIVE Final   Influenza B by PCR NEGATIVE NEGATIVE Final    Comment: (NOTE) The Xpert Xpress SARS-CoV-2/FLU/RSV plus assay is intended as an aid in the diagnosis of influenza from Nasopharyngeal swab specimens and should not be used as a sole basis for treatment. Nasal washings and aspirates are unacceptable for Xpert Xpress SARS-CoV-2/FLU/RSV testing.  Fact Sheet for Patients: BloggerCourse.com  Fact Sheet for Healthcare Providers: SeriousBroker.it  This test is not yet approved or cleared by the [place] FDA and has been authorized for detection and/or diagnosis of SARS-CoV-2 by FDA under an Emergency Use Authorization (EUA). This EUA will remain in effect (meaning this test can be used) for the duration of the COVID-19 declaration under Section 564(b)(1) of the Act, 21 U.S.C. section 360bbb-3(b)(1), unless the authorization is terminated or revoked.  Performed at [facility], [address], [place], [place] [address]   Resp Panel by RT-PCR (Flu A&B, Covid) Nasopharyngeal Swab     Status: None   Collection Time: 08/06/2020 11:29 PM   Specimen: Nasopharyngeal Swab; Nasopharyngeal(NP) swabs in vial transport medium  Result Value Ref Range Status   SARS Coronavirus 2 by RT PCR NEGATIVE NEGATIVE Final    Comment: (NOTE) SARS-CoV-2 target nucleic acids are NOT DETECTED.  The SARS-CoV-2 RNA is generally detectable in upper respiratory specimens during the acute phase of infection. The lowest concentration of SARS-CoV-2 viral copies this assay can detect is 138 copies/mL. A negative result does not preclude SARS-Cov-2 infection and should not be used as the sole basis for treatment or other patient management decisions. A negative result may occur with  improper specimen collection/handling, submission of specimen  other than nasopharyngeal swab, presence of viral mutation(s) within the areas targeted by this assay, and inadequate number of viral copies(<138 copies/mL). A negative result must be combined with clinical observations, patient history, and epidemiological information. The expected result is Negative.  Fact Sheet for Patients:  BloggerCourse.com  Fact Sheet  for Healthcare Providers:  SeriousBroker.it  This test is no t yet approved or cleared by the [place] and  has been authorized for detection and/or diagnosis of SARS-CoV-2 by FDA under an Emergency Use Authorization (EUA). This EUA will remain  in effect (meaning this test can be used) for the duration of the COVID-19 declaration under Section 564(b)(1) of the Act, 21 U.S.C.section 360bbb-3(b)(1), unless the authorization is terminated  or revoked sooner.       Influenza A by PCR NEGATIVE NEGATIVE Final   Influenza B by PCR NEGATIVE NEGATIVE Final    Comment: (NOTE) The Xpert Xpress SARS-CoV-2/FLU/RSV plus assay is intended as an aid in the diagnosis of influenza from Nasopharyngeal swab specimens and should not be used as a sole basis for treatment. Nasal washings and aspirates are unacceptable for Xpert Xpress SARS-CoV-2/FLU/RSV testing.  Fact Sheet for Patients: BloggerCourse.com  Fact Sheet for Healthcare Providers: SeriousBroker.it  This test is not yet approved or cleared by the [place] FDA and has been authorized for detection and/or diagnosis of SARS-CoV-2 by FDA under an Emergency Use Authorization (EUA). This EUA will remain in effect (meaning this test can be used) for the duration of the COVID-19 declaration under Section 564(b)(1) of the Act, 21 U.S.C. section 360bbb-3(b)(1), unless the authorization is terminated or revoked.  Performed at [facility], [address] [place], [place] [address]   MRSA PCR Screening     Status: None   Collection Time: 08/12/20  1:18 AM   Specimen: Nasal Mucosa; Nasopharyngeal  Result Value Ref Range Status   MRSA by PCR NEGATIVE NEGATIVE Final    Comment:        The GeneXpert MRSA Assay (FDA approved for NASAL specimens only), is one component of a comprehensive MRSA colonization surveillance program. It is not intended to diagnose MRSA infection nor to guide or monitor treatment for MRSA infections. Performed at [facility], [address] [place], [place] [address]          Radiology Studies: No results found.      Scheduled Meds: . Chlorhexidine Gluconate Cloth  6 each Topical Daily  . enoxaparin (LOVENOX) injection  40 mg Subcutaneous Q24H  . feeding supplement  237 mL Oral BID BM  . fluconazole  100 mg Oral Daily  . hydroxychloroquine  200 mg Oral BID  . ipratropium-albuterol  3 mL Nebulization TID  . mouth rinse  15 mL Mouth Rinse BID  . methylPREDNISolone (SOLU-MEDROL) injection  125 mg Intravenous Q6H  . multivitamin with minerals  1 tablet Oral Daily  . pantoprazole  40 mg Oral BID   Continuous Infusions: . sodium chloride    . ceFEPime (MAXIPIME) IV Stopped (08/13/20 0236)     LOS: 2 days   The patient is critically ill with multiple organ systems failure and requires high complexity decision making for assessment and support, frequent evaluation and titration of therapies, application of advanced monitoring technologies and extensive interpretation of multiple databases. Critical Care Time devoted to patient care services described in this note  Time spent: 40 minutes     Christon Gallaway, Roselind Messier, MD [facility] Pager [phone]  If 7PM-7AM, please contact night-coverage [email or web address] Password [facility] 08/13/2020, 7:28 AM

## 2020-08-13 NOTE — Progress Notes (Signed)
NAME:  Kimberly Shelton, MRN:  [id number], DOB:  1961-03-29, LOS: 2 ADMISSION DATE:  09/05/2020, CONSULTATION DATE:  12/6  REFERRING MD:  Hanley Ben, CHIEF COMPLAINT:  hypoxia  Brief History   59 y/o F who presented 12/5 with progressively worsening shortness of breath.  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  Recent admission 11/29-12/1 for new hypoxic respiratory failure and treated with antibiotics for possible PNA.  After discharge, she had progressive SOB, presented to [facility] on 12/5 with sats in the 70's on her 4L baseline.    During her recent admission she was not given steroids due to concern for duodenal ulcers discovered in the past few months.  She has remote history of cavitary pneumococcal pneumonia and subsequently developed an aspergilloma.  She was treated with long-term voriconazole with resolution.  Until about 6 weeks ago when she was admitted for acute cholecystitis she had no limiting shortness of breath.  Past Medical History  Rheumatoid arthritis UIP Aspergilloma  Significant Hospital Events     Consults:  PCCM  Procedures:    Significant Diagnostic Tests:  CXR 11/5 >> worsening bilateral infiltrates, lower lobe predominant  Micro Data:  COVID 12/5 >> negative Influenza A/B 12/5 >> negative   MRSA PCR 12/7 >> negative  PCT 12/6 >> 0.26  Antimicrobials:  Vanc 12/6 >> 12/7 Cefepime 12/6 >>  Interim history/subjective:  Feeling a little bit better today. Appetite slowly improving. She was able to eat  2 bowls of cereal with breakfast. No n/v/d, no rashes.  Objective   Blood pressure 115/76, pulse (!) 110, temperature 98.6 F (37 C), temperature source Axillary, resp. rate (!) 21, height 5\' 3"  (1.6 m), weight 59.5 kg, last menstrual period 09/06/2004, SpO2 94 %.        Intake/Output Summary (Last 24 hours) at 08/13/2020 1543 Last data filed at 08/13/2020 1200 Gross per 24 hour  Intake 759.08 ml  Output  700 ml  Net 59.08 ml   Filed Weights   09/04/2020 2318 08/12/20 0123 08/13/20 0451  Weight: 62.9 kg 58.5 kg 59.5 kg    Examination: General: Frail appearing elderly woman sitting up in bed no acute distress HEENT: [place]/AT, eyes anicteric Neuro: Awake and alert, answering questions appropriately, moving all extremities spontaneously. CV: S1-S2, regular rate and rhythm PULM: Tachypnea, no accessory muscle use.  Rales bilaterally.  On 15 L nasal cannula GI: Soft, nondistended Extremities: No cyanosis or edema Skin: No rashes or ecchymosis  Resolved Hospital Problem list     Assessment & Plan:   Acute on Chronic Hypoxic Respiratory Failure RA related UIP  Suspect progressive hypoxic failure and recent recurrent admissions most likely due to flare of UIP. She has been treated empirically for infection with levofloxacin.   -continue solumedrol 125 mg IV Q6 -Continue broad-spectrum antibiotics given history of immunosuppression. -high dose PPI with steroids with hx of recent ulcers  -respiratory culture if possible, although she has not been expectorating sputum -Continue xanax as needed for anxiety  -Palliative Care consult recommended  -Continue supplemental oxygen as required to maintain SPO2 greater than 90%.  Anticipate she will need more with movement. -if this is a flare of UIP and not reversible process, she is aware of poor prognosis -she is intolerant to antifibrotics unfortunately, which may explain her decompensation recently since they were stopped. -DNR/DNI; if she decompensates we would treat symptoms -encouraged pursed lip breathing technique with patient  -supportive care   Hypokalemia -  resolved  -monitor, replaced 12/7   Chronic anemia -serial CBCs -transfuse for Hgb <7% or active bleeding   Hyperglycemia due to steroids -SSI PRN  Duodenal ulcers, high risk for bleeding on steroids -con't PPI -have previously discussed risk with her and she understands that  we have limited treatment options at this point.   Daughter updated at bedside with patient.   Best practice (evaluated daily)   Per primary  Labs   CBC: Recent Labs  Lab 08/30/2020 2329 08/11/20 0734 08/12/20 0252 08/13/20 0301  WBC 22.1* 19.9* 22.8* 20.6*  NEUTROABS 18.4*  --   --   --   HGB 9.5* 9.6* 9.3* 9.5*  HCT 32.6* 32.1* 31.8* 32.7*  MCV 80.3 80.0 79.3* 80.9  PLT 489* 415* 440* 428*    Basic Metabolic Panel: Recent Labs  Lab 08/21/2020 2329 08/11/20 0734 08/12/20 0252 08/13/20 0301  NA 134* 136 136 136  K 3.7 3.9 3.4* 4.4  CL 98 95* 95* 99  CO2 22 28 28 26   GLUCOSE 139* 171* 157* 122*  BUN 10 12 18  23*  CREATININE 0.57 0.64 0.69 0.53  CALCIUM 7.9* 8.5* 8.7* 8.6*  MG  --  2.6*  --  2.1  PHOS  --   --   --  3.0   GFR: Estimated Creatinine Clearance: 61.9 mL/min (by C-G formula based on SCr of 0.53 mg/dL). Recent Labs  Lab 08/11/2020 2329 08/11/20 0734 08/12/20 0252 08/13/20 0301  PROCALCITON  --  0.26 0.17 0.10  WBC 22.1* 19.9* 22.8* 20.6*    Liver Function Tests: Recent Labs  Lab 08/24/2020 2329  AST 20  ALT 13  ALKPHOS 158*  BILITOT 0.5  PROT 6.4*  ALBUMIN 2.3*   No results for input(s): LIPASE, AMYLASE in the last 168 hours. No results for input(s): AMMONIA in the last 168 hours.  ABG    Component Value Date/Time   PHART 7.399 08/17/2020 2329   PCO2ART 38.3 08/23/2020 2329   PO2ART 76.9 (L) 08/12/2020 2329   HCO3 23.2 08/24/2020 2329   ACIDBASEDEF 0.9 08/30/2020 2329   O2SAT 93.8 08/17/2020 2329     Coagulation Profile: No results for input(s): INR, PROTIME in the last 168 hours.  Cardiac Enzymes: No results for input(s): CKTOTAL, CKMB, CKMBINDEX, TROPONINI in the last 168 hours.  HbA1C: No results found for: HGBA1C  CBG: No results for input(s): GLUCAP in the last 168 hours.   14/01/2020, DO 08/13/20 6:20 PM [facility]

## 2020-08-14 ENCOUNTER — Ambulatory Visit: Payer: 59 | Admitting: Internal Medicine

## 2020-08-14 DIAGNOSIS — Z7189 Other specified counseling: Secondary | ICD-10-CM

## 2020-08-14 LAB — COMPREHENSIVE METABOLIC PANEL
ALT: 18 U/L (ref 0–44)
AST: 22 U/L (ref 15–41)
Albumin: 2.3 g/dL — ABNORMAL LOW (ref 3.5–5.0)
Alkaline Phosphatase: 137 U/L — ABNORMAL HIGH (ref 38–126)
Anion gap: 11 (ref 5–15)
BUN: 21 mg/dL — ABNORMAL HIGH (ref 6–20)
CO2: 27 mmol/L (ref 22–32)
Calcium: 8.8 mg/dL — ABNORMAL LOW (ref 8.9–10.3)
Chloride: 99 mmol/L (ref 98–111)
Creatinine, Ser: 0.53 mg/dL (ref 0.44–1.00)
GFR, Estimated: >60 mL/min (ref >60)
Glucose, Bld: 133 mg/dL — ABNORMAL HIGH (ref 70–99)
Potassium: 4.9 mmol/L (ref 3.5–5.1)
Sodium: 137 mmol/L (ref 135–145)
Total Bilirubin: 0.3 mg/dL (ref 0.3–1.2)
Total Protein: 6.6 g/dL (ref 6.5–8.1)

## 2020-08-14 LAB — CBC WITH DIFFERENTIAL/PLATELET
Abs Immature Granulocytes: 0.63 10*3/uL — ABNORMAL HIGH (ref 0.00–0.07)
Basophils Absolute: 0.1 10*3/uL (ref 0.0–0.1)
Basophils Relative: 0 %
Eosinophils Absolute: 0 10*3/uL (ref 0.0–0.5)
Eosinophils Relative: 0 %
HCT: 35.8 % — ABNORMAL LOW (ref 36.0–46.0)
Hemoglobin: 10.4 g/dL — ABNORMAL LOW (ref 12.0–15.0)
Immature Granulocytes: 3 %
Lymphocytes Relative: 5 %
Lymphs Abs: 0.9 10*3/uL (ref 0.7–4.0)
MCH: 23.4 pg — ABNORMAL LOW (ref 26.0–34.0)
MCHC: 29.1 g/dL — ABNORMAL LOW (ref 30.0–36.0)
MCV: 80.4 fL (ref 80.0–100.0)
Monocytes Absolute: 0.7 10*3/uL (ref 0.1–1.0)
Monocytes Relative: 3 %
Neutro Abs: 17.3 10*3/uL — ABNORMAL HIGH (ref 1.7–7.7)
Neutrophils Relative %: 89 %
Platelets: 463 10*3/uL — ABNORMAL HIGH (ref 150–400)
RBC: 4.45 MIL/uL (ref 3.87–5.11)
RDW: 17.6 % — ABNORMAL HIGH (ref 11.5–15.5)
WBC: 19.5 10*3/uL — ABNORMAL HIGH (ref 4.0–10.5)
nRBC: 0.2 % (ref 0.0–0.2)

## 2020-08-14 LAB — MAGNESIUM: Magnesium: 2.2 mg/dL (ref 1.7–2.4)

## 2020-08-14 LAB — PROCALCITONIN: Procalcitonin: 0.1 ng/mL

## 2020-08-14 MED ORDER — MORPHINE SULFATE 10 MG/5ML PO SOLN
4.0000 mg | ORAL | Status: DC
  Administered 2020-08-14 – 2020-08-15 (×4): 4 mg via ORAL
  Filled 2020-08-14 (×4): qty 5

## 2020-08-14 MED ORDER — FAMOTIDINE 20 MG PO TABS
20.0000 mg | ORAL_TABLET | Freq: Two times a day (BID) | ORAL | Status: DC
  Administered 2020-08-14 – 2020-08-17 (×6): 20 mg via ORAL
  Filled 2020-08-14 (×7): qty 1

## 2020-08-14 MED ORDER — ALPRAZOLAM 1 MG PO TABS
1.0000 mg | ORAL_TABLET | Freq: Three times a day (TID) | ORAL | Status: DC | PRN
  Administered 2020-08-14 – 2020-08-17 (×7): 1 mg via ORAL
  Filled 2020-08-14 (×7): qty 1

## 2020-08-14 NOTE — Progress Notes (Signed)
    Interdisciplinary Goals of Care Family Meeting   Date carried out:: 08/14/2020  Location of the meeting: Bedside  Member's involved: Physician, Family Member or next of kin and Other: Renato Gails and daughter on phone  Durable Power of Attorney or Environmental health practitioner: Daughter    Discussion: We discussed goals of care for Kimberly Shelton .  - patient has declined and ccm attending Dr Chestine Spore indicated to patient about worsening progrnosis. Patient requested I visit with patient due to me being her opd ILD doc. Pastor joined at tail end of conversation.  I believe she has declined in last 2 days since admit. More hypoxemic. She is way more labored.   I told patient, daughter that would be very surprising if patient left hospoital alive. Explained if she got home or to rehab with less o2 it would be a surprise. Explained would not be surprising if she passed away this admoission from illness  However, she exprssed desire to continue acitve Rx and hope for bst outcome. She is fearful of dying and pastor gave her comfort with his words.   Code status: Full DNR  Disposition: Continue current acute care  + concurrent palliation for dyspnea/anxiety relief. Add scheduled oral moprhine. IF worsens, escalate palliation - d/w Clark  Time spent for the meeting: 20 min      SIGNATURE    Dr. Kalman Shan, M.D., F.C.C.P,  Pulmonary and Critical Care Medicine Staff Physician, [facility] Center Director - Interstitial Lung Disease  Program  Pulmonary Fibrosis [facility] at [facility] [place], [place], [address]  Pager: [phone], If no answer  OR between  19:00-7:00h: page [phone] Telephone (clinical office): [phone] Telephone (research): [phone]  6:43 PM 08/14/2020

## 2020-08-14 NOTE — Progress Notes (Addendum)
NAME:  Kimberly Shelton, MRN:  [id number], DOB:  August 06, 1961, LOS: 3 ADMISSION DATE:  08-24-20, CONSULTATION DATE:  12/6  REFERRING MD:  Hanley Ben, CHIEF COMPLAINT:  hypoxia  Brief History   59 y/o F who presented 12/5 with progressively worsening shortness of breath.  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  Recent admission 11/29-12/1 for new hypoxic respiratory failure and treated with antibiotics for possible PNA.  After discharge, she had progressive SOB, presented to [facility] on 12/5 with sats in the 70's on her 4L baseline.    During her recent admission she was not given steroids due to concern for duodenal ulcers discovered in the past few months.  She has remote history of cavitary pneumococcal pneumonia and subsequently developed an aspergilloma.  She was treated with long-term voriconazole with resolution.  Until about 6 weeks ago when she was admitted for acute cholecystitis she had no limiting shortness of breath.  Past Medical History  Rheumatoid arthritis UIP Aspergilloma  Significant Hospital Events   12/05 Admit  12/07 On 15L HFNC, pt reports anxiety / xanax helping some   Consults:  PCCM  Procedures:    Significant Diagnostic Tests:  CXR 11/5 >> worsening bilateral infiltrates, lower lobe predominant  Micro Data:  COVID 12/5 >> negative Influenza A/B 12/5 >> negative   MRSA PCR 12/7 >> negative  PCT 12/6 >> 0.26  Antimicrobials:  Vanc 12/6 >> 12/7 Cefepime 12/6 >>   Interim history/subjective:  Afebrile  I/O 1.4L UOP, -1.1L in last 24 hours  Remains on 15L [place] O2   Pt reports she has been anxious, coughing with acid reflux after ensure   Objective   Blood pressure (!) 132/102, pulse (!) 104, temperature (!) 97.4 F (36.3 C), temperature source Oral, resp. rate (!) 30, height 5\' 3"  (1.6 m), weight 60.5 kg, last menstrual period 09/06/2004, SpO2 (!) 87 %.        Intake/Output Summary (Last 24 hours)  at 08/14/2020 1207 Last data filed at 08/14/2020 1000 Gross per 24 hour  Intake 494.75 ml  Output 1400 ml  Net -905.25 ml   Filed Weights   08/12/20 0123 08/13/20 0451 08/14/20 0500  Weight: 58.5 kg 59.5 kg 60.5 kg    Examination: General: ill appearing adult female sitting up in bed in NAD  HEENT: MM pink/dry, Roy +NRB O2, anicteric  Neuro: AAOx4, speech clear, MAE CV: s1s2 RRR, no m/r/g PULM: mild accessory muscle use at rest but no distress, bibasilar crackles, dry cough on exam  GI: soft, bsx4 active  Extremities: warm/dry, no edema  Skin: no rashes or lesions  Resolved Hospital Problem list   Hypokalemia   Assessment & Plan:   Acute on Chronic Hypoxic Respiratory Failure RA related UIP  Suspect progressive hypoxic failure and recent recurrent admissions most likely due to flare of UIP. She has been treated empirically for infection with levofloxacin.   -solumedrol 125mg  IV Q6  -broad spectrum abx with hx of immunosuppression  -high dose PPI, hx of recent ulcers -appreciate Palliative Care evaluation  -wean O2 for sats >90%, anticipate she will need more with exertion and will desaturate -DNR / DNI -supporitve care  -if able, obtain respiratory culture but she has not had sputum production  -pt well aware of her pulmonary circumstances, if this does not improve with steroid administration, may represent UIP flare and not a reversible process  -continue plaquenil for RA -may need to consider morphine for  dyspnea / comfort   Anxiety  -continue xanax, increase dose for patient comfort   Chronic anemia -per primary   Hyperglycemia due to steroids -per primary   Duodenal ulcers, high risk for bleeding on steroids GERD -continue PPI with recent ulcers, risks of steroids explained to patient  -add H2 blocker as well  Patient and Sister updated at bedside.  Best practice (evaluated daily)  Per primary  Labs   CBC: Recent Labs  Lab August 15, 2020 2329 08/11/20 0734  08/12/20 0252 08/13/20 0301 08/14/20 0305  WBC 22.1* 19.9* 22.8* 20.6* 19.5*  NEUTROABS 18.4*  --   --   --  17.3*  HGB 9.5* 9.6* 9.3* 9.5* 10.4*  HCT 32.6* 32.1* 31.8* 32.7* 35.8*  MCV 80.3 80.0 79.3* 80.9 80.4  PLT 489* 415* 440* 428* 463*    Basic Metabolic Panel: Recent Labs  Lab 15-Aug-2020 2329 08/11/20 0734 08/12/20 0252 08/13/20 0301 08/14/20 0305  NA 134* 136 136 136 137  K 3.7 3.9 3.4* 4.4 4.9  CL 98 95* 95* 99 99  CO2 22 28 28 26 27   GLUCOSE 139* 171* 157* 122* 133*  BUN 10 12 18  23* 21*  CREATININE 0.57 0.64 0.69 0.53 0.53  CALCIUM 7.9* 8.5* 8.7* 8.6* 8.8*  MG  --  2.6*  --  2.1 2.2  PHOS  --   --   --  3.0  --    GFR: Estimated Creatinine Clearance: 61.9 mL/min (by C-G formula based on SCr of 0.53 mg/dL). Recent Labs  Lab 08/11/20 0734 08/12/20 0252 08/13/20 0301 08/14/20 0305  PROCALCITON 0.26 0.17 0.10 0.10  WBC 19.9* 22.8* 20.6* 19.5*    Liver Function Tests: Recent Labs  Lab 08-15-2020 2329 08/14/20 0305  AST 20 22  ALT 13 18  ALKPHOS 158* 137*  BILITOT 0.5 0.3  PROT 6.4* 6.6  ALBUMIN 2.3* 2.3*   No results for input(s): LIPASE, AMYLASE in the last 168 hours. No results for input(s): AMMONIA in the last 168 hours.  ABG    Component Value Date/Time   PHART 7.399 August 15, 2020 2329   PCO2ART 38.3 08-15-20 2329   PO2ART 76.9 (L) 08-15-20 2329   HCO3 23.2 2020/08/15 2329   ACIDBASEDEF 0.9 2020/08/15 2329   O2SAT 93.8 08/15/2020 2329     Coagulation Profile: No results for input(s): INR, PROTIME in the last 168 hours.  Cardiac Enzymes: No results for input(s): CKTOTAL, CKMB, CKMBINDEX, TROPONINI in the last 168 hours.  HbA1C: No results found for: HGBA1C  CBG: No results for input(s): GLUCAP in the last 168 hours.   14/01/2020, MSN, NP-C, AGACNP-BC [facility] 08/14/2020, 12:08 PM   Please see [email or web address] for pager details.

## 2020-08-14 NOTE — Telephone Encounter (Signed)
Hospital team to address xanax/anxiety. If needed call pall care -which Dr Chestine Spore already recommended for symptom mgmt

## 2020-08-14 NOTE — Telephone Encounter (Signed)
Attempted to call Danford Bad with [facility] but unable to reach. Left message for her to return call.

## 2020-08-14 NOTE — Progress Notes (Deleted)
Pt was found with prescription meds in room. Charge nurse took med to pharmacy to lock up. Pt consented to belongings being searched by nurse. I found two pocket knifes among pt's belongings. Pt's pocket knifes placed in envelope and brought to security to lock up until pt is discharged. 

## 2020-08-14 NOTE — Progress Notes (Signed)
PROGRESS NOTE    Kimberly Shelton  RUE:[id number] DOB: 11/23/1960 DOA: 08-30-20 PCP: Kristian Covey, MD   Brief Narrative:  59 y.o. WF PMHx  RA-ILD, COPD, HFpEF, depression, anxiety, PUD, and chronic anemia and thrombocytosis,   Presenting to the emergency department for evaluation of shortness of breath and hypoxia.  Patient was discharged from the hospital on 08/06/2020 after treatment for suspected pneumonia, was on 4 L/min of supplemental oxygen at that time.  Since that discharge, she has completed her course of Levaquin, was started on fluconazole for suspected thrush, has developed worsening anxiety and reports oxygen saturation of 80% with slight exertion despite 4 L/min of supplemental oxygen.  She denies any associated chest pain, fevers, or chills.  She denies leg swelling or tenderness but does report some orthopnea.  ED Course: Upon arrival to the ED, patient is found to be afebrile, saturating upper 90s on nonrebreather, tachypneic in the 30s, tachycardic to 130, and with blood pressure as low as 85/53.  EKG features sinus tachycardia with rate 127.  Chest x-ray notable for unchanged multifocal opacities which could reflect pneumonia or edema.  Chemistry panel with normal renal function.  CBC reveals a leukocytosis 22,100, normocytic anemia with hemoglobin 9.5, and thrombocytosis with platelets 489,000.  BNP is elevated 249, up from 90 a week earlier.  Patient was given 40 mg IV Lasix in the ED.   Subjective: 12/9 afebrile overnight A/O x4, positive acute on chronic S OB.  Currently patient on HFNC + NRB.  Per RN patient has been off and on NRB all night.   Assessment & Plan:  Covid vaccination; vaccinated  Principal Problem:   Acute on chronic respiratory failure with hypoxia (HCC) Active Problems:   Acute on chronic diastolic CHF (congestive heart failure) (HCC)   Rheumatoid arthritis involving multiple joints (HCC)   Interstitial lung disease due to connective tissue  disease (HCC)   COPD (chronic obstructive pulmonary disease) (HCC)   Duodenal ulcer: Per EGD 06/15/2020   Normocytic anemia   Thrombocytosis   Acute on Chronic Diastolic dysfunction  - Patient with ILD, COPD, HFpEF, requiring 4 Lpm supplemental O2 at time of recent discharge presents with increased SOB and saturation of 80% on 4 Lpm and is found to have stable CXR, no fever, and newly elevated BNP  - She was given 40 mg IV Lasix in ED, is diuresing well and reporting some improvement  - Continue diuresis with Lasix 20 mg IV q12h, monitor weight and I/Os   -Strict ins and out -1.5 L -Daily weight Filed Weights   08/12/20 0123 08/13/20 0451 08/14/20 0500  Weight: 58.5 kg 59.5 kg 60.5 kg    Sinus tachycardia  - HR 120-130 in ED, confirmed sinus on EKG  -Currently NSR.  Sinus tachycardia most likely secondary to her acute on chronic respiratory failure with hypoxia -She was tachycardic during recent admission and ruled-out for PE with CTA on 08/04/20  -12/9 patient's heart rate back in the 110s to 120.  Morphine, plus additional NRB helps.   Acute on Chronic Respiratory failure with hypoxia/COPD/ RA-ILD -States has been seeing Dr. [name] PCCM for 7 years -On Plaquenil for ILD, told to discontinue prednisone at recent discharge -Plaquenil 200 mg BID -Continue 7-day course antibiotics -DuoNeb TID -Solu-Medrol 125 mg QID -Incentive spirometry -Flutter valve -Morphine PRN air hunger  Pulmonary Aspergilloma - Appears stable, no hemoptysis   Rheumatoid arthritis  - Continue Plaquenil    Anemia/ Leukocytosis/Thrombocytosis   - Hgb 9.5  on admission, similar to priors with no active bleeding, she will continue iron supplementation  - Platelets 489k on admission, decreasing from last month and likely reactive  - Leukocytosis secondary maximum doses of steroids.  Negative fever, negative bands, negative left shift.    PUD   - Continue PPI    Suspected thrush  - Continue  fluconazole   Anxiety  - Continue low-dose Xanax as needed   Goals of care -12/8 Palliative Care consult; patient wants to continue with current mode of care.  Agrees with DNR.  Recommends SNF rehab with palliative care -12/8 will have charge nurse call daughter in the a.m. and discuss exact current visitation hours and regulations   DVT prophylaxis: Lovenox Code Status: DNR Family Communication: 12/8 spoke at length with [name] discussed plan of care answered all questions Status is: Inpatient    Dispo: The patient is from: Home              Anticipated d/c is to: SNF vs hospice              Anticipated d/c date is: 12/13              Patient currently unstable      Consultants:  PCCM   Procedures/Significant Events:  CXR 11/5 >> worsening bilateral infiltrates, lower lobe predominant   I have personally reviewed and interpreted all radiology studies and my findings are as above.  VENTILATOR SETTINGS: HFNC +NRB 12/9 Flow 15 L/min FiO2; 100% SPO2 92%   Cultures COVID 12/5 >> negative Influenza A/B 12/5 >> negative   MRSA PCR 12/7 >> negative  PCT 12/6 >> 0.26  Antimicrobials: Anti-infectives (From admission, onward)   Start     Ordered Stop   08/12/20 0500  vancomycin (VANCOREADY) IVPB 750 mg/150 mL  Status:  Discontinued        08/11/20 2113 08/12/20 1322   08/11/20 2300  vancomycin (VANCOREADY) IVPB 750 mg/150 mL  Status:  Discontinued        08/11/20 1025 08/11/20 2113   08/11/20 1030  vancomycin (VANCOREADY) IVPB 1250 mg/250 mL        08/11/20 1025 08/11/20 1958   08/11/20 1030  ceFEPIme (MAXIPIME) 2 g in sodium chloride 0.9 % 100 mL IVPB        08/11/20 1025 08/13/2020 1029   08/11/20 1000  fluconazole (DIFLUCAN) tablet 100 mg        08/11/20 0200 08/16/20 0959   08/11/20 1000  hydroxychloroquine (PLAQUENIL) tablet 200 mg        08/11/20 0210         Devices    LINES / TUBES:      Continuous Infusions: . sodium chloride    .  ceFEPime (MAXIPIME) IV Stopped (08/14/20 0434)     Objective: Vitals:   08/14/20 0400 08/14/20 0409 08/14/20 0500 08/14/20 0839  BP: (!) 150/69  132/81   Pulse: 87  78   Resp: (!) 25  (!) 23   Temp:  (!) 97.3 F (36.3 C)  (!) 97.4 F (36.3 C)  TempSrc:  Axillary  Oral  SpO2: 90%  98% 92%  Weight:   60.5 kg   Height:        Intake/Output Summary (Last 24 hours) at 08/14/2020 0919 Last data filed at 08/14/2020 0622 Gross per 24 hour  Intake 292.86 ml  Output 1400 ml  Net -1107.14 ml   Filed Weights   08/12/20 0123 08/13/20 0451 08/14/20 0500  Weight: 58.5 kg 59.5 kg 60.5 kg    Examination:  General: A/O x4, positive acute on chronic respiratory distress, cachectic Eyes: negative scleral hemorrhage, negative anisocoria, negative icterus ENT: Negative Runny nose, negative gingival bleeding, Neck:  Negative scars, masses, torticollis, lymphadenopathy, JVD Lungs: tachypneic, decreased breath sounds bilaterally, diffuse crackles, mild expiratory wheeze LEFT>>> RIGHT Cardiovascular: Sinus tachycardia without murmur gallop or rub normal S1 and S2 Abdomen: negative abdominal pain, nondistended, positive soft, bowel sounds, no rebound, no ascites, no appreciable mass Extremities: No significant cyanosis, clubbing, or edema bilateral lower extremities Skin: Negative rashes, lesions, ulcers Psychiatric:  Negative depression, negative anxiety, negative fatigue, negative mania  Central nervous system:  Cranial nerves II through XII intact, tongue/uvula midline, all extremities muscle strength 5/5, sensation intact throughout, negative dysarthria, negative expressive aphasia, negative receptive aphasia.  .     Data Reviewed: Care during the described time interval was provided by me .  I have reviewed this patient's available data, including medical history, events of note, physical examination, and all test results as part of my evaluation.   CBC: Recent Labs  Lab Aug 14, 2020 2329  08/11/20 0734 08/12/20 0252 08/13/20 0301 08/14/20 0305  WBC 22.1* 19.9* 22.8* 20.6* 19.5*  NEUTROABS 18.4*  --   --   --  17.3*  HGB 9.5* 9.6* 9.3* 9.5* 10.4*  HCT 32.6* 32.1* 31.8* 32.7* 35.8*  MCV 80.3 80.0 79.3* 80.9 80.4  PLT 489* 415* 440* 428* 463*   Basic Metabolic Panel: Recent Labs  Lab August 14, 2020 2329 08/11/20 0734 08/12/20 0252 08/13/20 0301 08/14/20 0305  NA 134* 136 136 136 137  K 3.7 3.9 3.4* 4.4 4.9  CL 98 95* 95* 99 99  CO2 22 28 28 26 27   GLUCOSE 139* 171* 157* 122* 133*  BUN 10 12 18  23* 21*  CREATININE 0.57 0.64 0.69 0.53 0.53  CALCIUM 7.9* 8.5* 8.7* 8.6* 8.8*  MG  --  2.6*  --  2.1 2.2  PHOS  --   --   --  3.0  --    GFR: Estimated Creatinine Clearance: 61.9 mL/min (by C-G formula based on SCr of 0.53 mg/dL). Liver Function Tests: Recent Labs  Lab 08-14-20 2329 08/14/20 0305  AST 20 22  ALT 13 18  ALKPHOS 158* 137*  BILITOT 0.5 0.3  PROT 6.4* 6.6  ALBUMIN 2.3* 2.3*   No results for input(s): LIPASE, AMYLASE in the last 168 hours. No results for input(s): AMMONIA in the last 168 hours. Coagulation Profile: No results for input(s): INR, PROTIME in the last 168 hours. Cardiac Enzymes: No results for input(s): CKTOTAL, CKMB, CKMBINDEX, TROPONINI in the last 168 hours. BNP (last 3 results) No results for input(s): PROBNP in the last 8760 hours. HbA1C: No results for input(s): HGBA1C in the last 72 hours. CBG: No results for input(s): GLUCAP in the last 168 hours. Lipid Profile: No results for input(s): CHOL, HDL, LDLCALC, TRIG, CHOLHDL, LDLDIRECT in the last 72 hours. Thyroid Function Tests: No results for input(s): TSH, T4TOTAL, FREET4, T3FREE, THYROIDAB in the last 72 hours. Anemia Panel: No results for input(s): VITAMINB12, FOLATE, FERRITIN, TIBC, IRON, RETICCTPCT in the last 72 hours. Urine analysis:    Component Value Date/Time   COLORURINE YELLOW 06/09/2020 0117   APPEARANCEUR CLEAR 06/09/2020 0117   LABSPEC 1.031 (H) 06/09/2020  0117   PHURINE 6.0 06/09/2020 0117   GLUCOSEU NEGATIVE 06/09/2020 0117   HGBUR NEGATIVE 06/09/2020 0117   BILIRUBINUR NEGATIVE 06/09/2020 0117   BILIRUBINUR n 07/21/2015 1428  KETONESUR 5 (A) 06/09/2020 0117   PROTEINUR 30 (A) 06/09/2020 0117   UROBILINOGEN negative 07/21/2015 1428   UROBILINOGEN 2.0 (H) 02/16/2014 1226   NITRITE NEGATIVE 06/09/2020 0117   LEUKOCYTESUR NEGATIVE 06/09/2020 0117   Sepsis Labs: (procalcitonin:4,lacticidven:4)  ) Recent Results (from the past 240 hour(s))  Resp Panel by RT-PCR (Flu A&B, Covid) Nasopharyngeal Swab     Status: None   Collection Time: 08/04/20 12:28 PM   Specimen: Nasopharyngeal Swab; Nasopharyngeal(NP) swabs in vial transport medium  Result Value Ref Range Status   SARS Coronavirus 2 by RT PCR NEGATIVE NEGATIVE Final    Comment: (NOTE) SARS-CoV-2 target nucleic acids are NOT DETECTED.  The SARS-CoV-2 RNA is generally detectable in upper respiratory specimens during the acute phase of infection. The lowest concentration of SARS-CoV-2 viral copies this assay can detect is 138 copies/mL. A negative result does not preclude SARS-Cov-2 infection and should not be used as the sole basis for treatment or other patient management decisions. A negative result may occur with  improper specimen collection/handling, submission of specimen other than nasopharyngeal swab, presence of viral mutation(s) within the areas targeted by this assay, and inadequate number of viral copies(<138 copies/mL). A negative result must be combined with clinical observations, patient history, and epidemiological information. The expected result is Negative.  Fact Sheet for Patients:  BloggerCourse.com  Fact Sheet for Healthcare Providers:  SeriousBroker.it  This test is no t yet approved or cleared by the [place] FDA and  has been authorized for detection and/or diagnosis of SARS-CoV-2 by FDA  under an Emergency Use Authorization (EUA). This EUA will remain  in effect (meaning this test can be used) for the duration of the COVID-19 declaration under Section 564(b)(1) of the Act, 21 U.S.C.section 360bbb-3(b)(1), unless the authorization is terminated  or revoked sooner.       Influenza A by PCR NEGATIVE NEGATIVE Final   Influenza B by PCR NEGATIVE NEGATIVE Final    Comment: (NOTE) The Xpert Xpress SARS-CoV-2/FLU/RSV plus assay is intended as an aid in the diagnosis of influenza from Nasopharyngeal swab specimens and should not be used as a sole basis for treatment. Nasal washings and aspirates are unacceptable for Xpert Xpress SARS-CoV-2/FLU/RSV testing.  Fact Sheet for Patients: BloggerCourse.com  Fact Sheet for Healthcare Providers: SeriousBroker.it  This test is not yet approved or cleared by the [place] FDA and has been authorized for detection and/or diagnosis of SARS-CoV-2 by FDA under an Emergency Use Authorization (EUA). This EUA will remain in effect (meaning this test can be used) for the duration of the COVID-19 declaration under Section 564(b)(1) of the Act, 21 U.S.C. section 360bbb-3(b)(1), unless the authorization is terminated or revoked.  Performed at [facility], [address] [place], [place] [address]   Resp Panel by RT-PCR (Flu A&B, Covid) Nasopharyngeal Swab     Status: None   Collection Time: 09/04/2020 11:29 PM   Specimen: Nasopharyngeal Swab; Nasopharyngeal(NP) swabs in vial transport medium  Result Value Ref Range Status   SARS Coronavirus 2 by RT PCR NEGATIVE NEGATIVE Final    Comment: (NOTE) SARS-CoV-2 target nucleic acids are NOT DETECTED.  The SARS-CoV-2 RNA is generally detectable in upper respiratory specimens during the acute phase of infection. The lowest concentration of SARS-CoV-2 viral copies this assay can detect is 138 copies/mL. A negative result  does not preclude SARS-Cov-2 infection and should not be used as the sole basis for treatment or other patient management decisions. A negative result may occur  with  improper specimen collection/handling, submission of specimen other than nasopharyngeal swab, presence of viral mutation(s) within the areas targeted by this assay, and inadequate number of viral copies(<138 copies/mL). A negative result must be combined with clinical observations, patient history, and epidemiological information. The expected result is Negative.  Fact Sheet for Patients:  BloggerCourse.com  Fact Sheet for Healthcare Providers:  SeriousBroker.it  This test is no t yet approved or cleared by the [place] FDA and  has been authorized for detection and/or diagnosis of SARS-CoV-2 by FDA under an Emergency Use Authorization (EUA). This EUA will remain  in effect (meaning this test can be used) for the duration of the COVID-19 declaration under Section 564(b)(1) of the Act, 21 U.S.C.section 360bbb-3(b)(1), unless the authorization is terminated  or revoked sooner.       Influenza A by PCR NEGATIVE NEGATIVE Final   Influenza B by PCR NEGATIVE NEGATIVE Final    Comment: (NOTE) The Xpert Xpress SARS-CoV-2/FLU/RSV plus assay is intended as an aid in the diagnosis of influenza from Nasopharyngeal swab specimens and should not be used as a sole basis for treatment. Nasal washings and aspirates are unacceptable for Xpert Xpress SARS-CoV-2/FLU/RSV testing.  Fact Sheet for Patients: BloggerCourse.com  Fact Sheet for Healthcare Providers: SeriousBroker.it  This test is not yet approved or cleared by the [place] FDA and has been authorized for detection and/or diagnosis of SARS-CoV-2 by FDA under an Emergency Use Authorization (EUA). This EUA will remain in effect (meaning this test can be used) for  the duration of the COVID-19 declaration under Section 564(b)(1) of the Act, 21 U.S.C. section 360bbb-3(b)(1), unless the authorization is terminated or revoked.  Performed at [facility], [address], [place], [place] [address]   MRSA PCR Screening     Status: None   Collection Time: 08/12/20  1:18 AM   Specimen: Nasal Mucosa; Nasopharyngeal  Result Value Ref Range Status   MRSA by PCR NEGATIVE NEGATIVE Final    Comment:        The GeneXpert MRSA Assay (FDA approved for NASAL specimens only), is one component of a comprehensive MRSA colonization surveillance program. It is not intended to diagnose MRSA infection nor to guide or monitor treatment for MRSA infections. Performed at [facility], [address], [place], [place] [address]          Radiology Studies: No results found.      Scheduled Meds: . Chlorhexidine Gluconate Cloth  6 each Topical Daily  . enoxaparin (LOVENOX) injection  40 mg Subcutaneous Q24H  . feeding supplement  237 mL Oral BID BM  . fluconazole  100 mg Oral Daily  . hydroxychloroquine  200 mg Oral BID  . ipratropium-albuterol  3 mL Nebulization TID  . mouth rinse  15 mL Mouth Rinse BID  . methylPREDNISolone (SOLU-MEDROL) injection  125 mg Intravenous Q6H  . multivitamin with minerals  1 tablet Oral Daily  . pantoprazole  40 mg Oral BID   Continuous Infusions: . sodium chloride    . ceFEPime (MAXIPIME) IV Stopped (08/14/20 0434)     LOS: 3 days   The patient is critically ill with multiple organ systems failure and requires high complexity decision making for assessment and support, frequent evaluation and titration of therapies, application of advanced monitoring technologies and extensive interpretation of multiple databases. Critical Care Time devoted to patient care services described in this note  Time spent: 40 minutes     Maksym Pfiffner J,  MD [facility] Pager  [phone]  If 7PM-7AM, please contact night-coverage [email or web address] Password [email or web address] 08/14/2020, 9:19 AM

## 2020-08-15 ENCOUNTER — Inpatient Hospital Stay (HOSPITAL_COMMUNITY): Payer: 59

## 2020-08-15 LAB — CBC WITH DIFFERENTIAL/PLATELET
Abs Immature Granulocytes: 0.57 10*3/uL — ABNORMAL HIGH (ref 0.00–0.07)
Basophils Absolute: 0.1 10*3/uL (ref 0.0–0.1)
Basophils Relative: 0 %
Eosinophils Absolute: 0 10*3/uL (ref 0.0–0.5)
Eosinophils Relative: 0 %
HCT: 33.7 % — ABNORMAL LOW (ref 36.0–46.0)
Hemoglobin: 9.9 g/dL — ABNORMAL LOW (ref 12.0–15.0)
Immature Granulocytes: 2 %
Lymphocytes Relative: 3 %
Lymphs Abs: 0.7 10*3/uL (ref 0.7–4.0)
MCH: 23.6 pg — ABNORMAL LOW (ref 26.0–34.0)
MCHC: 29.4 g/dL — ABNORMAL LOW (ref 30.0–36.0)
MCV: 80.4 fL (ref 80.0–100.0)
Monocytes Absolute: 0.7 10*3/uL (ref 0.1–1.0)
Monocytes Relative: 3 %
Neutro Abs: 22.3 10*3/uL — ABNORMAL HIGH (ref 1.7–7.7)
Neutrophils Relative %: 92 %
Platelets: 367 10*3/uL (ref 150–400)
RBC: 4.19 MIL/uL (ref 3.87–5.11)
RDW: 17.5 % — ABNORMAL HIGH (ref 11.5–15.5)
WBC: 24.3 10*3/uL — ABNORMAL HIGH (ref 4.0–10.5)
nRBC: 0.2 % (ref 0.0–0.2)

## 2020-08-15 LAB — COMPREHENSIVE METABOLIC PANEL
ALT: 31 U/L (ref 0–44)
AST: 50 U/L — ABNORMAL HIGH (ref 15–41)
Albumin: 2.1 g/dL — ABNORMAL LOW (ref 3.5–5.0)
Alkaline Phosphatase: 183 U/L — ABNORMAL HIGH (ref 38–126)
Anion gap: 10 (ref 5–15)
BUN: 20 mg/dL (ref 6–20)
CO2: 30 mmol/L (ref 22–32)
Calcium: 8.6 mg/dL — ABNORMAL LOW (ref 8.9–10.3)
Chloride: 100 mmol/L (ref 98–111)
Creatinine, Ser: 0.51 mg/dL (ref 0.44–1.00)
GFR, Estimated: >60 mL/min (ref >60)
Glucose, Bld: 145 mg/dL — ABNORMAL HIGH (ref 70–99)
Potassium: 4.9 mmol/L (ref 3.5–5.1)
Sodium: 140 mmol/L (ref 135–145)
Total Bilirubin: 0.5 mg/dL (ref 0.3–1.2)
Total Protein: 5.9 g/dL — ABNORMAL LOW (ref 6.5–8.1)

## 2020-08-15 LAB — PROCALCITONIN: Procalcitonin: 0.13 ng/mL

## 2020-08-15 LAB — MAGNESIUM: Magnesium: 2 mg/dL (ref 1.7–2.4)

## 2020-08-15 MED ORDER — MORPHINE SULFATE 10 MG/5ML PO SOLN
2.5000 mg | ORAL | Status: DC
  Administered 2020-08-15 – 2020-08-17 (×8): 2.5 mg via ORAL
  Filled 2020-08-15 (×9): qty 5

## 2020-08-15 NOTE — TOC Progression Note (Signed)
Transition of Care [facility]) - Progression Note    Patient Details  Name: Kimberly Shelton MRN: [id number] Date of Birth: 04/02/1961  [facility] CM/SW Contact  Golda Acre, RN Phone Number: 08/15/2020, 9:00 AM  Clinical Narrative:    Assessment & plan:  Acute respiratory failure with hypoxia- I am getting more concerned that this is a flare of UIP rather than an acute infectious or reversible inflammatory process.  -con't supplemental oxygen to maintain SpO2 >88% and to maintain comfort  -con't steroids and antibiotics  -con't xanax for anxiety- increased dose today   PLan undetermined at this time due to condition Following for progression      Expected Discharge Plan and Services           Expected Discharge Date:  (unknown)                                     Social Determinants of Health (SDOH) Interventions    Readmission Risk Interventions No flowsheet data found.

## 2020-08-15 NOTE — Progress Notes (Signed)
PROGRESS NOTE    Kimberly Shelton  ZOX:[id number] DOB: Feb 10, 1961 DOA: 08/31/2020 PCP: Kristian Covey, MD   Brief Narrative:  59 y.o. WF PMHx  RA-ILD, COPD, HFpEF, depression, anxiety, PUD, and chronic anemia and thrombocytosis,   Presenting to the emergency department for evaluation of shortness of breath and hypoxia.  Patient was discharged from the hospital on 08/06/2020 after treatment for suspected pneumonia, was on 4 L/min of supplemental oxygen at that time.  Since that discharge, she has completed her course of Levaquin, was started on fluconazole for suspected thrush, has developed worsening anxiety and reports oxygen saturation of 80% with slight exertion despite 4 L/min of supplemental oxygen.  She denies any associated chest pain, fevers, or chills.  She denies leg swelling or tenderness but does report some orthopnea.  ED Course: Upon arrival to the ED, patient is found to be afebrile, saturating upper 90s on nonrebreather, tachypneic in the 30s, tachycardic to 130, and with blood pressure as low as 85/53.  EKG features sinus tachycardia with rate 127.  Chest x-ray notable for unchanged multifocal opacities which could reflect pneumonia or edema.  Chemistry panel with normal renal function.  CBC reveals a leukocytosis 22,100, normocytic anemia with hemoglobin 9.5, and thrombocytosis with platelets 489,000.  BNP is elevated 249, up from 90 a week earlier.  Patient was given 40 mg IV Lasix in the ED.   Subjective: 12/10 afebrile overnight  A/O x4, continued positive acute on chronic S OB. Patient continued to require HF [place]+ NRB.    Assessment & Plan:  Covid vaccination; vaccinated  Principal Problem:   Acute on chronic respiratory failure with hypoxia (HCC) Active Problems:   Acute on chronic diastolic CHF (congestive heart failure) (HCC)   Rheumatoid arthritis involving multiple joints (HCC)   Interstitial lung disease due to connective tissue disease (HCC)   COPD  (chronic obstructive pulmonary disease) (HCC)   Duodenal ulcer: Per EGD 06/15/2020   Normocytic anemia   Thrombocytosis   Acute on Chronic Diastolic dysfunction  - Patient with ILD, COPD, HFpEF, requiring 4 Lpm supplemental O2 at time of recent discharge presents with increased SOB and saturation of 80% on 4 Lpm and is found to have stable CXR, no fever, and newly elevated BNP  - She was given 40 mg IV Lasix in ED, is diuresing well and reporting some improvement  - Continue diuresis with Lasix 20 mg IV q12h, monitor weight and I/Os   -Strict ins and out -1.4 L -Daily weight Filed Weights   08/13/20 0451 08/14/20 0500 08/15/20 0438  Weight: 59.5 kg 60.5 kg 61 kg    Sinus tachycardia  - HR 120-130 in ED, confirmed sinus on EKG  -Currently NSR.  Sinus tachycardia most likely secondary to her acute on chronic respiratory failure with hypoxia -She was tachycardic during recent admission and ruled-out for PE with CTA on 08/04/20  -12/9 patient's heart rate back in the 110s to 120.  Morphine, plus additional NRB helps.   Acute on Chronic Respiratory failure with hypoxia/COPD/ RA-ILD -States has been seeing Dr. [name] PCCM for 7 years -On Plaquenil for ILD, told to discontinue prednisone at recent discharge -Plaquenil 200 mg BID -Continue 7-day course antibiotics -DuoNeb TID -Solu-Medrol 125 mg QID -Incentive spirometry -Flutter valve -Morphine PRN air hunger  Pulmonary Aspergilloma - Appears stable, no hemoptysis   Rheumatoid arthritis  - Continue Plaquenil    Anemia/ Leukocytosis/Thrombocytosis   - Hgb 9.5 on admission, similar to priors with no active bleeding,  she will continue iron supplementation  - Platelets 489k on admission, decreasing from last month and likely reactive  - Leukocytosis secondary maximum doses of steroids.  Negative fever, negative bands, negative left shift.   PUD   - Continue PPI    Suspected thrush  - Continue fluconazole   Anxiety   - Continue low-dose Xanax as needed   Goals of care -12/8 Palliative Care consult; patient wants to continue with current mode of care.  Agrees with DNR.  Recommends SNF rehab with palliative care -12/8 will have charge nurse call daughter in the a.m. and discuss exact current visitation hours and regulations -12/10 do not believe patient is going to survive this hospitalization will speak with daughter again in the a.m.   DVT prophylaxis: Lovenox Code Status: DNR Family Communication: 12/8 spoke at length with [name] discussed plan of care answered all questions Status is: Inpatient    Dispo: The patient is from: Home              Anticipated d/c is to: SNF vs hospice              Anticipated d/c date is: 12/13              Patient currently unstable      Consultants:  PCCM   Procedures/Significant Events:  CXR 11/5 >> worsening bilateral infiltrates, lower lobe predominant   I have personally reviewed and interpreted all radiology studies and my findings are as above.  VENTILATOR SETTINGS: HFNC +NRB 12/10 Flow 15 L/min FiO2; 100% SPO2 91%   Cultures COVID 12/5 >> negative Influenza A/B 12/5 >> negative   MRSA PCR 12/7 >> negative  PCT 12/6 >> 0.26   Antimicrobials: Anti-infectives (From admission, onward)   Start     Ordered Stop   08/12/20 0500  vancomycin (VANCOREADY) IVPB 750 mg/150 mL  Status:  Discontinued        08/11/20 2113 08/12/20 1322   08/11/20 2300  vancomycin (VANCOREADY) IVPB 750 mg/150 mL  Status:  Discontinued        08/11/20 1025 08/11/20 2113   08/11/20 1030  vancomycin (VANCOREADY) IVPB 1250 mg/250 mL        08/11/20 1025 08/11/20 1958   08/11/20 1030  ceFEPIme (MAXIPIME) 2 g in sodium chloride 0.9 % 100 mL IVPB        08/11/20 1025 08/08/2020 1029   08/11/20 1000  fluconazole (DIFLUCAN) tablet 100 mg        08/11/20 0200 08/16/20 0959   08/11/20 1000  hydroxychloroquine (PLAQUENIL) tablet 200 mg        08/11/20 0210          Devices    LINES / TUBES:      Continuous Infusions: . sodium chloride    . ceFEPime (MAXIPIME) IV Stopped (08/15/20 0245)     Objective: Vitals:   08/15/20 0513 08/15/20 0600 08/15/20 0835 08/15/20 0840  BP:  (!) 152/75    Pulse:  96    Resp:  14    Temp:    97.6 F (36.4 C)  TempSrc:    Oral  SpO2: (!) 82% 98% 91%   Weight:      Height:        Intake/Output Summary (Last 24 hours) at 08/15/2020 0852 Last data filed at 08/15/2020 0629 Gross per 24 hour  Intake 854.08 ml  Output 700 ml  Net 154.08 ml   Filed Weights   08/13/20 0451 08/14/20 0500  08/15/20 0438  Weight: 59.5 kg 60.5 kg 61 kg    Examination:  General: A/O x4, positive acute on chronic respiratory distress, cachectic Eyes: negative scleral hemorrhage, negative anisocoria, negative icterus ENT: Negative Runny nose, negative gingival bleeding, Neck:  Negative scars, masses, torticollis, lymphadenopathy, JVD Lungs: tachypneic, decreased breath sounds bilaterally, diffuse crackles, mild expiratory wheeze LEFT>>> RIGHT Cardiovascular: Sinus tachycardia without murmur gallop or rub normal S1 and S2 Abdomen: negative abdominal pain, nondistended, positive soft, bowel sounds, no rebound, no ascites, no appreciable mass Extremities: No significant cyanosis, clubbing, or edema bilateral lower extremities Skin: Negative rashes, lesions, ulcers Psychiatric:  Negative depression, negative anxiety, negative fatigue, negative mania  Central nervous system:  Cranial nerves II through XII intact, tongue/uvula midline, all extremities muscle strength 5/5, sensation intact throughout, negative dysarthria, negative expressive aphasia, negative receptive aphasia.  .     Data Reviewed: Care during the described time interval was provided by me .  I have reviewed this patient's available data, including medical history, events of note, physical examination, and all test results as part of my evaluation.    CBC: Recent Labs  Lab 2020/08/20 2329 08/11/20 0734 08/12/20 0252 08/13/20 0301 08/14/20 0305 08/15/20 0313  WBC 22.1* 19.9* 22.8* 20.6* 19.5* 24.3*  NEUTROABS 18.4*  --   --   --  17.3* 22.3*  HGB 9.5* 9.6* 9.3* 9.5* 10.4* 9.9*  HCT 32.6* 32.1* 31.8* 32.7* 35.8* 33.7*  MCV 80.3 80.0 79.3* 80.9 80.4 80.4  PLT 489* 415* 440* 428* 463* 367   Basic Metabolic Panel: Recent Labs  Lab 08/11/20 0734 08/12/20 0252 08/13/20 0301 08/14/20 0305 08/15/20 0313  NA 136 136 136 137 140  K 3.9 3.4* 4.4 4.9 4.9  CL 95* 95* 99 99 100  CO2 28 28 26 27 30   GLUCOSE 171* 157* 122* 133* 145*  BUN 12 18 23* 21* 20  CREATININE 0.64 0.69 0.53 0.53 0.51  CALCIUM 8.5* 8.7* 8.6* 8.8* 8.6*  MG 2.6*  --  2.1 2.2 2.0  PHOS  --   --  3.0  --   --    GFR: Estimated Creatinine Clearance: 61.9 mL/min (by C-G formula based on SCr of 0.51 mg/dL). Liver Function Tests: Recent Labs  Lab 08-20-20 2329 08/14/20 0305 08/15/20 0313  AST 20 22 50*  ALT 13 18 31   ALKPHOS 158* 137* 183*  BILITOT 0.5 0.3 0.5  PROT 6.4* 6.6 5.9*  ALBUMIN 2.3* 2.3* 2.1*   No results for input(s): LIPASE, AMYLASE in the last 168 hours. No results for input(s): AMMONIA in the last 168 hours. Coagulation Profile: No results for input(s): INR, PROTIME in the last 168 hours. Cardiac Enzymes: No results for input(s): CKTOTAL, CKMB, CKMBINDEX, TROPONINI in the last 168 hours. BNP (last 3 results) No results for input(s): PROBNP in the last 8760 hours. HbA1C: No results for input(s): HGBA1C in the last 72 hours. CBG: No results for input(s): GLUCAP in the last 168 hours. Lipid Profile: No results for input(s): CHOL, HDL, LDLCALC, TRIG, CHOLHDL, LDLDIRECT in the last 72 hours. Thyroid Function Tests: No results for input(s): TSH, T4TOTAL, FREET4, T3FREE, THYROIDAB in the last 72 hours. Anemia Panel: No results for input(s): VITAMINB12, FOLATE, FERRITIN, TIBC, IRON, RETICCTPCT in the last 72 hours. Urine analysis:     Component Value Date/Time   COLORURINE YELLOW 06/09/2020 0117   APPEARANCEUR CLEAR 06/09/2020 0117   LABSPEC 1.031 (H) 06/09/2020 0117   PHURINE 6.0 06/09/2020 0117   GLUCOSEU NEGATIVE 06/09/2020 0117  HGBUR NEGATIVE 06/09/2020 0117   BILIRUBINUR NEGATIVE 06/09/2020 0117   BILIRUBINUR n 07/21/2015 1428   KETONESUR 5 (A) 06/09/2020 0117   PROTEINUR 30 (A) 06/09/2020 0117   UROBILINOGEN negative 07/21/2015 1428   UROBILINOGEN 2.0 (H) 02/16/2014 1226   NITRITE NEGATIVE 06/09/2020 0117   LEUKOCYTESUR NEGATIVE 06/09/2020 0117   Sepsis Labs: (procalcitonin:4,lacticidven:4)  ) Recent Results (from the past 240 hour(s))  Resp Panel by RT-PCR (Flu A&B, Covid) Nasopharyngeal Swab     Status: None   Collection Time: 09/04/2020 11:29 PM   Specimen: Nasopharyngeal Swab; Nasopharyngeal(NP) swabs in vial transport medium  Result Value Ref Range Status   SARS Coronavirus 2 by RT PCR NEGATIVE NEGATIVE Final    Comment: (NOTE) SARS-CoV-2 target nucleic acids are NOT DETECTED.  The SARS-CoV-2 RNA is generally detectable in upper respiratory specimens during the acute phase of infection. The lowest concentration of SARS-CoV-2 viral copies this assay can detect is 138 copies/mL. A negative result does not preclude SARS-Cov-2 infection and should not be used as the sole basis for treatment or other patient management decisions. A negative result may occur with  improper specimen collection/handling, submission of specimen other than nasopharyngeal swab, presence of viral mutation(s) within the areas targeted by this assay, and inadequate number of viral copies(<138 copies/mL). A negative result must be combined with clinical observations, patient history, and epidemiological information. The expected result is Negative.  Fact Sheet for Patients:  BloggerCourse.com  Fact Sheet for Healthcare Providers:  SeriousBroker.it  This test  is no t yet approved or cleared by the [place] FDA and  has been authorized for detection and/or diagnosis of SARS-CoV-2 by FDA under an Emergency Use Authorization (EUA). This EUA will remain  in effect (meaning this test can be used) for the duration of the COVID-19 declaration under Section 564(b)(1) of the Act, 21 U.S.C.section 360bbb-3(b)(1), unless the authorization is terminated  or revoked sooner.       Influenza A by PCR NEGATIVE NEGATIVE Final   Influenza B by PCR NEGATIVE NEGATIVE Final    Comment: (NOTE) The Xpert Xpress SARS-CoV-2/FLU/RSV plus assay is intended as an aid in the diagnosis of influenza from Nasopharyngeal swab specimens and should not be used as a sole basis for treatment. Nasal washings and aspirates are unacceptable for Xpert Xpress SARS-CoV-2/FLU/RSV testing.  Fact Sheet for Patients: BloggerCourse.com  Fact Sheet for Healthcare Providers: SeriousBroker.it  This test is not yet approved or cleared by the [place] FDA and has been authorized for detection and/or diagnosis of SARS-CoV-2 by FDA under an Emergency Use Authorization (EUA). This EUA will remain in effect (meaning this test can be used) for the duration of the COVID-19 declaration under Section 564(b)(1) of the Act, 21 U.S.C. section 360bbb-3(b)(1), unless the authorization is terminated or revoked.  Performed at [facility], [address] [place], [place] [address]   MRSA PCR Screening     Status: None   Collection Time: 08/12/20  1:18 AM   Specimen: Nasal Mucosa; Nasopharyngeal  Result Value Ref Range Status   MRSA by PCR NEGATIVE NEGATIVE Final    Comment:        The GeneXpert MRSA Assay (FDA approved for NASAL specimens only), is one component of a comprehensive MRSA colonization surveillance program. It is not intended to diagnose MRSA infection nor to guide or monitor treatment for MRSA  infections. Performed at [facility], [address], [place], [place] [address]  Radiology Studies: DG CHEST PORT 1 VIEW  Result Date: 08/15/2020 CLINICAL DATA:  Shortness of breath EXAM: PORTABLE CHEST 1 VIEW COMPARISON:  09/02/2020 and prior FINDINGS: Emphysema with prominent biapical bulla. No pneumothorax or pleural effusion. Patchy and confluent bilateral pulmonary opacities are unchanged. Partially obscured cardiomediastinal silhouette. Multilevel spondylosis. IMPRESSION: Unchanged appearance of bilateral pulmonary opacities. Electronically Signed   By: Stana Bunting M.D.   On: 08/15/2020 08:32        Scheduled Meds: . Chlorhexidine Gluconate Cloth  6 each Topical Daily  . enoxaparin (LOVENOX) injection  40 mg Subcutaneous Q24H  . famotidine  20 mg Oral BID  . feeding supplement  237 mL Oral BID BM  . fluconazole  100 mg Oral Daily  . hydroxychloroquine  200 mg Oral BID  . ipratropium-albuterol  3 mL Nebulization TID  . mouth rinse  15 mL Mouth Rinse BID  . methylPREDNISolone (SOLU-MEDROL) injection  125 mg Intravenous Q6H  . morphine  4 mg Oral Q4H  . multivitamin with minerals  1 tablet Oral Daily  . pantoprazole  40 mg Oral BID   Continuous Infusions: . sodium chloride    . ceFEPime (MAXIPIME) IV Stopped (08/15/20 0245)     LOS: 4 days   The patient is critically ill with multiple organ systems failure and requires high complexity decision making for assessment and support, frequent evaluation and titration of therapies, application of advanced monitoring technologies and extensive interpretation of multiple databases. Critical Care Time devoted to patient care services described in this note  Time spent: 40 minutes     Jessica Checketts, Roselind Messier, MD [facility] Pager [phone]  If 7PM-7AM, please contact night-coverage [email or web address] Password [facility] 08/15/2020, 8:52 AM

## 2020-08-15 NOTE — Progress Notes (Signed)
NAME:  Kimberly Shelton, MRN:  [id number], DOB:  Jun 07, 1961, LOS: 4 ADMISSION DATE:  08-26-20, CONSULTATION DATE:  12/6  REFERRING MD:  Hanley Ben, CHIEF COMPLAINT:  hypoxia  Brief History   59 y/o F who presented 12/5 with progressively worsening shortness of breath.  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  Recent admission 11/29-12/1 for new hypoxic respiratory failure and treated with antibiotics for possible PNA.  After discharge, she had progressive SOB, presented to [facility] on 12/5 with sats in the 70's on her 4L baseline.    During her recent admission she was not given steroids due to concern for duodenal ulcers discovered in the past few months.  She has remote history of cavitary pneumococcal pneumonia and subsequently developed an aspergilloma.  She was treated with long-term voriconazole with resolution.  Until about 6 weeks ago when she was admitted for acute cholecystitis she had no limiting shortness of breath.  Past Medical History  Rheumatoid arthritis UIP Aspergilloma  Significant Hospital Events   12/05 Admit  12/07 On 15L HFNC, pt reports anxiety / xanax helping some  12/09 Seen by primary pulmonary MD, clinical decline discussed, opiates initiated for dyspnea  Consults:  PCCM  Procedures:    Significant Diagnostic Tests:  CXR 11/5 >> worsening bilateral infiltrates, lower lobe predominant  Micro Data:  COVID 12/5 >> negative Influenza A/B 12/5 >> negative   MRSA PCR 12/7 >> negative  PCT 12/6 >> 0.26  Antimicrobials:  Vanc 12/6 >> 12/7 Cefepime 12/6 >>   Interim history/subjective:  Afebrile  Pt drowsy this am, states she feels ok but is sleepy  No acute complaints   Objective   Blood pressure (!) 152/75, pulse 96, temperature 97.6 F (36.4 C), temperature source Oral, resp. rate 14, height 5\' 3"  (1.6 m), weight 61 kg, last menstrual period 09/06/2004, SpO2 91 %.    FiO2 (%):  [100 %] 100 %    Intake/Output Summary (Last 24 hours) at 08/15/2020 1011 Last data filed at 08/15/2020 14/06/2020 Gross per 24 hour  Intake 832.19 ml  Output 700 ml  Net 132.19 ml   Filed Weights   08/13/20 0451 08/14/20 0500 08/15/20 0438  Weight: 59.5 kg 60.5 kg 61 kg    Examination: General: adult female lying in bed in NAD HEENT: MM pink/dry, HFNC 15L + NRB Neuro: drowsy, awakens to voice, speech clear, MAE CV: s1s2 RRR, no m/r/g PULM: work of breathing improved, lungs bilaterally with diffuse crackles GI: soft, bsx4 active  Extremities: warm/dry, no edema  Skin: no rashes or lesions  Resolved Hospital Problem list   Hypokalemia   Assessment & Plan:   Acute on Chronic Hypoxic Respiratory Failure RA related UIP  Suspect progressive hypoxic failure and recent recurrent admissions most likely due to flare of UIP. She has been treated empirically for infection with levofloxacin.   -continue solumedrol 125mg  IV Q6  -low dose morphine for dyspnea, dose reduced due to drowsiness  -high dose PPI with hx of recent ulcers -appreciate Palliative Care -DNR / DNI  -wean O2 for sats >90% -likely this is UIP flare and not a reversible process given no improvement in symptom burden with high dose steroids  Anxiety  -continue xanax for comfort  Chronic anemia -per primary    Hyperglycemia due to steroids -per primary   Duodenal ulcers, high risk for bleeding on steroids GERD -PPI with recent ulcers, pt accepting of risk of high dose steroids -H2  blocker     Best practice (evaluated daily)  Per primary  PCCM will follow intermittently.  Labs   CBC: Recent Labs  Lab 08-27-20 2329 08/11/20 0734 08/12/20 0252 08/13/20 0301 08/14/20 0305 08/15/20 0313  WBC 22.1* 19.9* 22.8* 20.6* 19.5* 24.3*  NEUTROABS 18.4*  --   --   --  17.3* 22.3*  HGB 9.5* 9.6* 9.3* 9.5* 10.4* 9.9*  HCT 32.6* 32.1* 31.8* 32.7* 35.8* 33.7*  MCV 80.3 80.0 79.3* 80.9 80.4 80.4  PLT 489* 415* 440* 428* 463* 367     Basic Metabolic Panel: Recent Labs  Lab 08/11/20 0734 08/12/20 0252 08/13/20 0301 08/14/20 0305 08/15/20 0313  NA 136 136 136 137 140  K 3.9 3.4* 4.4 4.9 4.9  CL 95* 95* 99 99 100  CO2 28 28 26 27 30   GLUCOSE 171* 157* 122* 133* 145*  BUN 12 18 23* 21* 20  CREATININE 0.64 0.69 0.53 0.53 0.51  CALCIUM 8.5* 8.7* 8.6* 8.8* 8.6*  MG 2.6*  --  2.1 2.2 2.0  PHOS  --   --  3.0  --   --    GFR: Estimated Creatinine Clearance: 61.9 mL/min (by C-G formula based on SCr of 0.51 mg/dL). Recent Labs  Lab 08/12/20 0252 08/13/20 0301 08/14/20 0305 08/15/20 0313 08/15/20 0625  PROCALCITON 0.17 0.10 0.10  --  0.13  WBC 22.8* 20.6* 19.5* 24.3*  --     Liver Function Tests: Recent Labs  Lab 2020/08/27 2329 08/14/20 0305 08/15/20 0313  AST 20 22 50*  ALT 13 18 31   ALKPHOS 158* 137* 183*  BILITOT 0.5 0.3 0.5  PROT 6.4* 6.6 5.9*  ALBUMIN 2.3* 2.3* 2.1*   No results for input(s): LIPASE, AMYLASE in the last 168 hours. No results for input(s): AMMONIA in the last 168 hours.  ABG    Component Value Date/Time   PHART 7.399 08/27/20 2329   PCO2ART 38.3 August 27, 2020 2329   PO2ART 76.9 (L) 08/27/2020 2329   HCO3 23.2 2020-08-27 2329   ACIDBASEDEF 0.9 2020/08/27 2329   O2SAT 93.8 08/27/2020 2329     Coagulation Profile: No results for input(s): INR, PROTIME in the last 168 hours.  Cardiac Enzymes: No results for input(s): CKTOTAL, CKMB, CKMBINDEX, TROPONINI in the last 168 hours.  HbA1C: No results found for: HGBA1C  CBG: No results for input(s): GLUCAP in the last 168 hours.   14/01/2020, MSN, NP-C, AGACNP-BC [facility] 08/15/2020, 10:11 AM   Please see [email or web address] for pager details.

## 2020-08-16 DIAGNOSIS — Z7189 Other specified counseling: Secondary | ICD-10-CM

## 2020-08-16 DIAGNOSIS — Z515 Encounter for palliative care: Secondary | ICD-10-CM

## 2020-08-16 DIAGNOSIS — R531 Weakness: Secondary | ICD-10-CM

## 2020-08-16 LAB — COMPREHENSIVE METABOLIC PANEL
ALT: 29 U/L (ref 0–44)
AST: 22 U/L (ref 15–41)
Albumin: 2.2 g/dL — ABNORMAL LOW (ref 3.5–5.0)
Alkaline Phosphatase: 189 U/L — ABNORMAL HIGH (ref 38–126)
Anion gap: 11 (ref 5–15)
BUN: 21 mg/dL — ABNORMAL HIGH (ref 6–20)
CO2: 31 mmol/L (ref 22–32)
Calcium: 8.6 mg/dL — ABNORMAL LOW (ref 8.9–10.3)
Chloride: 96 mmol/L — ABNORMAL LOW (ref 98–111)
Creatinine, Ser: 0.36 mg/dL — ABNORMAL LOW (ref 0.44–1.00)
GFR, Estimated: >60 mL/min (ref >60)
Glucose, Bld: 151 mg/dL — ABNORMAL HIGH (ref 70–99)
Potassium: 4.5 mmol/L (ref 3.5–5.1)
Sodium: 138 mmol/L (ref 135–145)
Total Bilirubin: 0.2 mg/dL — ABNORMAL LOW (ref 0.3–1.2)
Total Protein: 6 g/dL — ABNORMAL LOW (ref 6.5–8.1)

## 2020-08-16 LAB — CBC WITH DIFFERENTIAL/PLATELET
Abs Immature Granulocytes: 0 10*3/uL (ref 0.00–0.07)
Band Neutrophils: 2 %
Basophils Absolute: 0 10*3/uL (ref 0.0–0.1)
Basophils Relative: 0 %
Blasts: 0 %
Eosinophils Absolute: 0 10*3/uL (ref 0.0–0.5)
Eosinophils Relative: 0 %
HCT: 32.6 % — ABNORMAL LOW (ref 36.0–46.0)
Hemoglobin: 9.3 g/dL — ABNORMAL LOW (ref 12.0–15.0)
Lymphocytes Relative: 2 %
Lymphs Abs: 0.5 10*3/uL — ABNORMAL LOW (ref 0.7–4.0)
MCH: 23.5 pg — ABNORMAL LOW (ref 26.0–34.0)
MCHC: 28.5 g/dL — ABNORMAL LOW (ref 30.0–36.0)
MCV: 82.3 fL (ref 80.0–100.0)
Metamyelocytes Relative: 0 %
Monocytes Absolute: 0.5 10*3/uL (ref 0.1–1.0)
Monocytes Relative: 2 %
Myelocytes: 0 %
Neutro Abs: 26.1 10*3/uL — ABNORMAL HIGH (ref 1.7–7.7)
Neutrophils Relative %: 94 %
Other: 0 %
Platelets: 349 10*3/uL (ref 150–400)
Promyelocytes Relative: 0 %
RBC: 3.96 MIL/uL (ref 3.87–5.11)
RDW: 17.7 % — ABNORMAL HIGH (ref 11.5–15.5)
WBC: 27.1 10*3/uL — ABNORMAL HIGH (ref 4.0–10.5)
nRBC: 0 /100 WBC
nRBC: 0.3 % — ABNORMAL HIGH (ref 0.0–0.2)

## 2020-08-16 LAB — MAGNESIUM: Magnesium: 2.2 mg/dL (ref 1.7–2.4)

## 2020-08-16 NOTE — Progress Notes (Signed)
Daily Progress Note   Patient Name: Kimberly Shelton       Date: 08/16/2020 DOB: 09-Jan-1961  Age: 59 y.o. MRN#: [id number] Attending Physician: Drema Dallas, MD Primary Care Physician: Kristian Covey, MD Admit Date: 08/30/2020  Reason for Consultation/Follow-up: Establishing goals of care  Subjective: Patient is awake alert sitting up in bed.  She has high oxygen requirements.  She is on 15 L in addition to nonrebreather mask.  She attempts to talk but has severe respiratory distress when she tries to have conversations.  Brother-in-law is present at the bedside.  Also discussed with daughter on the phone.  Discussed with pulmonary critical care service as well as with hospital medicine.  Length of Stay: 5  Current Medications: Scheduled Meds:  . Chlorhexidine Gluconate Cloth  6 each Topical Daily  . enoxaparin (LOVENOX) injection  40 mg Subcutaneous Q24H  . famotidine  20 mg Oral BID  . feeding supplement  237 mL Oral BID BM  . hydroxychloroquine  200 mg Oral BID  . ipratropium-albuterol  3 mL Nebulization TID  . mouth rinse  15 mL Mouth Rinse BID  . methylPREDNISolone (SOLU-MEDROL) injection  125 mg Intravenous Q6H  . morphine  2.5 mg Oral Q4H  . multivitamin with minerals  1 tablet Oral Daily  . pantoprazole  40 mg Oral BID    Continuous Infusions: . sodium chloride    . ceFEPime (MAXIPIME) IV Stopped (08/16/20 1004)    PRN Meds: sodium chloride, acetaminophen **OR** acetaminophen, albuterol, ALPRAZolam, morphine injection, ondansetron **OR** ondansetron (ZOFRAN) IV, senna-docusate, sodium chloride  Physical Exam         Appears chronically ill Sitting up in bed Has dry oral mucosa possibly also has discolored lips Awake and appropriate, has respiratory  distress Diminished breath sounds No edema Tachycardic on the monitor Abdomen not distended  Vital Signs: BP (!) 155/75   Pulse (!) 116   Temp 97.6 F (36.4 C) (Axillary)   Resp 17   Ht 5\' 3"  (1.6 m)   Wt 61.1 kg   LMP 09/06/2004   SpO2 99%   BMI 23.86 kg/m  SpO2: SpO2: 99 % O2 Device: O2 Device: Nasal Cannula,NRB ([place]) O2 Flow Rate: O2 Flow Rate (L/min): 15 L/min (plus 15L NRB)  Intake/output summary:   Intake/Output Summary (Last 24 hours)  at 08/16/2020 1601 Last data filed at 08/16/2020 1049 Gross per 24 hour  Intake 514.31 ml  Output 1000 ml  Net -485.69 ml   LBM: Last BM Date: 08-12-2020 Baseline Weight: Weight: 62.9 kg Most recent weight: Weight: 61.1 kg       Palliative Assessment/Data:      Patient Active Problem List   Diagnosis Date Noted  . Acute on chronic respiratory failure with hypoxia (HCC) 08/11/2020  . Normocytic anemia 08/11/2020  . Thrombocytosis 08/11/2020  . Gastric ulcer: Per EGD 06/15/2020 08/05/2020  . Duodenal ulcer: Per EGD 06/15/2020 08/05/2020  . [place] grade D esophagitis: The EGD 06/15/2020 08/05/2020  . COPD (chronic obstructive pulmonary disease) (HCC) 08/04/2020  . Upper GI bleed   . Atrial fibrillation (HCC)   . Acute calculous cholecystitis 06/09/2020  . Nausea & vomiting 06/09/2020  . Hiatal hernia 06/09/2020  . Immunosuppression due to drug therapy (HCC) 06/09/2020  . Conductive hearing loss of both ears 06/09/2020  . Emphysema of lung (HCC) 06/09/2020  . Interstitial lung disease due to connective tissue disease (HCC) 06/09/2020  . Coronary artery calcification of native artery 06/09/2020  . Bronchiectasis (HCC) 06/09/2020  . Cholecystitis 06/09/2020  . Constipation 08/13/2019  . Chronic Hemoptysis from cavitary Aspergillosis 08/01/2019  . Medication monitoring encounter 03/20/2019  . Aspergillosis (HCC) 03/14/2019  . Former smoker 03/13/2019  . Referred otalgia of both ears 03/13/2019  . Cavitary lung  disease 02/27/2018  . Aortic atherosclerosis (HCC) 12/29/2017  . Atrial tachycardia (HCC) 12/29/2017  . Atrial septal aneurysm 12/28/2017  . History of pneumonia 02/09/2016  . History of smoking 02/09/2016  . CIN III (cervical intraepithelial neoplasia III) 09/21/2015  . Rheumatoid arthritis involving multiple joints (HCC) 08/09/2014  . Acute on chronic diastolic CHF (congestive heart failure) (HCC) 02/19/2014  . DEPRESSION 03/26/2009  . CARPAL TUNNEL SYNDROME 03/26/2009  . ARTHRALGIA 03/26/2009    Palliative Care Assessment & Plan   Patient Profile:    Assessment: Acute on chronic hypoxic respiratory failure due to interstitial lung disease flare Air hunger, generalized anxiety, shortness of breath  Recommendations/Plan: Discussed with patient about appropriate symptom management even in the face of ongoing decline from a pulmonary standpoint.  Call placed and discussed with patient's daughter about patient's current condition being 1 of ongoing decline decompensation.  Shared with her about patient having markedly limited prognosis.  Patient and family elected for comfort measures, liberalize visitor status, DNR/DNI.  Goals of Care and Additional Recommendations: Limitations on Scope of Treatment: Full Comfort Care  Code Status:    Code Status Orders  (From admission, onward)         Start     Ordered   08/11/20 0155  Do not attempt resuscitation (DNR)  Continuous       Question Answer Comment  In the event of cardiac or respiratory ARREST Do not call a "code blue"   In the event of cardiac or respiratory ARREST Do not perform Intubation, CPR, defibrillation or ACLS   In the event of cardiac or respiratory ARREST Use medication by any route, position, wound care, and other measures to relive pain and suffering. May use oxygen, suction and manual treatment of airway obstruction as needed for comfort.      08/11/20 0156        Code Status History    Date Active Date  Inactive Code Status Order ID Comments User Context   08/04/2020 1700 08/06/2020 2130 DNR [id number]  Jae Dire, MD Inpatient  06/09/2020 0829 06/16/2020 2202 Full Code [id number]  Jae Dire, MD ED   02/16/2014 1606 02/24/2014 1746 Full Code [id number]  Dorothea Ogle, MD Inpatient   Advance Care Planning Activity      Prognosis:  Hours - Days  Discharge Planning: Anticipated Hospital Death  Care plan was discussed with patient and her brother who was at the bedside.  Separately call placed and discussed with daughter on the phone as well.  Thank you for allowing the Palliative Medicine Team to assist in the care of this patient.   Time In: 11 Time Out: 11.35 Total Time 35 Prolonged Time Billed  no       Greater than 50%  of this time was spent counseling and coordinating care related to the above assessment and plan.  Rosalin Hawking, MD  Please contact Palliative Medicine Team phone at [phone] for questions and concerns.

## 2020-08-16 NOTE — Progress Notes (Signed)
NAME:  Kimberly Shelton, MRN:  [id number], DOB:  07/12/1961, LOS: 5 ADMISSION DATE:  09/02/2020, CONSULTATION DATE:  12/6  REFERRING MD:  Hanley Ben, CHIEF COMPLAINT:  hypoxia  Brief History   59 y/o F who presented 12/5 with progressively worsening shortness of breath.  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  Recent admission 11/29-12/1 for new hypoxic respiratory failure and treated with antibiotics for possible PNA.  After discharge, she had progressive SOB, presented to [facility] on 12/5 with sats in the 70's on her 4L baseline.    During her recent admission she was not given steroids due to concern for duodenal ulcers discovered in the past few months.  She has remote history of cavitary pneumococcal pneumonia and subsequently developed an aspergilloma.  She was treated with long-term voriconazole with resolution.  Until about 6 weeks ago when she was admitted for acute cholecystitis she had no limiting shortness of breath.  Past Medical History  Rheumatoid arthritis UIP Aspergilloma  Significant Hospital Events   12/05 Admit  12/07 On 15L HFNC, pt reports anxiety / xanax helping some  12/09 Seen by primary pulmonary MD, clinical decline discussed, opiates initiated for dyspnea  Consults:  PCCM  Procedures:    Significant Diagnostic Tests:  CXR 11/5 >> worsening bilateral infiltrates, lower lobe predominant  Micro Data:  COVID 12/5 >> negative Influenza A/B 12/5 >> negative   MRSA PCR 12/7 >> negative  PCT 12/6 >> 0.26  Antimicrobials:  Vanc 12/6 >> 12/7 Cefepime 12/6 >>  Fluconazole 12/6>  Interim history/subjective:  Overnight saturations were as low as upper 70s despite NRB and HFNC. Today she is fatigued.  Objective   Blood pressure (!) 151/95, pulse (!) 122, temperature (!) 97.4 F (36.3 C), temperature source Oral, resp. rate (!) 23, height 5\' 3"  (1.6 m), weight 61.1 kg, last menstrual period 09/06/2004, SpO2 (!) 88  %.    FiO2 (%):  [100 %] 100 %   Intake/Output Summary (Last 24 hours) at 08/16/2020 1147 Last data filed at 08/16/2020 1049 Gross per 24 hour  Intake 514.31 ml  Output 1000 ml  Net -485.69 ml   Filed Weights   08/14/20 0500 08/15/20 0438 08/16/20 0307  Weight: 60.5 kg 61 kg 61.1 kg    Examination: General: ill appearing woman sitting up in bed sleeping HEENT: [place]/AT, eyes anicteric, oral mucosa dry. Lips discolored from morphine syrup. Neuro: drowsy, arouses during exam CV: tachycardic, regular rhythm PULM: tachypneic, mildly increased work of breathing GI: soft, NT, ND Extremities: no clubbing or edema, ulnar deformities of hands, no active synovitis Skin: no rashes or wounds, no cyanosis  Resolved Hospital Problem list   Hypokalemia   Assessment & Plan:   Acute on Chronic Hypoxic Respiratory Failure due to acute UIP flare - worsening despite steroids and antibiotics. This is end stage unfortunately and will continue to progress. RA related UIP  -she wishes to continue aggressive care with antibiotics and steroids, but understands that MV is not a reasonable option as she would not be able to be weaned from MV. I think BiPAP would be similar and do not think it will change much. She has been doing well with morphine and Xanax so far. -continue solumedrol 125mg  IV Q6  -con't empiric antibiotics to complete 7 days -high dose PPI with hx of recent ulcers -appreciate Palliative Care -DNR / DNI  -wean O2 for sats >90%; ok to keep NRB if it maintains comfort -  Liberalized visitation- discussed with charge RN, patient's Pastor, Palliative Care and primary team.   Anxiety  -continue xanax for comfort  Chronic anemia -per primary    Hyperglycemia due to steroids -per primary   Duodenal ulcers, high risk for bleeding on steroids GERD -PPI with recent ulcers, pt accepting of risk of high dose steroids -H2 blocker     Best practice (evaluated daily)  Per primary PCCM  will follow intermittently.  Labs   CBC: Recent Labs  Lab 08/29/2020 2329 08/11/20 0734 08/12/20 0252 08/13/20 0301 08/14/20 0305 08/15/20 0313 08/16/20 0252  WBC 22.1*   < > 22.8* 20.6* 19.5* 24.3* 27.1*  NEUTROABS 18.4*  --   --   --  17.3* 22.3* 26.1*  HGB 9.5*   < > 9.3* 9.5* 10.4* 9.9* 9.3*  HCT 32.6*   < > 31.8* 32.7* 35.8* 33.7* 32.6*  MCV 80.3   < > 79.3* 80.9 80.4 80.4 82.3  PLT 489*   < > 440* 428* 463* 367 349   < > = values in this interval not displayed.    Basic Metabolic Panel: Recent Labs  Lab 08/11/20 0734 08/12/20 0252 08/13/20 0301 08/14/20 0305 08/15/20 0313 08/16/20 0252  NA 136 136 136 137 140 138  K 3.9 3.4* 4.4 4.9 4.9 4.5  CL 95* 95* 99 99 100 96*  CO2 28 28 26 27 30 31   GLUCOSE 171* 157* 122* 133* 145* 151*  BUN 12 18 23* 21* 20 21*  CREATININE 0.64 0.69 0.53 0.53 0.51 0.36*  CALCIUM 8.5* 8.7* 8.6* 8.8* 8.6* 8.6*  MG 2.6*  --  2.1 2.2 2.0 2.2  PHOS  --   --  3.0  --   --   --    GFR: Estimated Creatinine Clearance: 61.9 mL/min (A) (by C-G formula based on SCr of 0.36 mg/dL (L)). Recent Labs  Lab 08/12/20 0252 08/13/20 0301 08/14/20 0305 08/15/20 0313 08/15/20 0625 08/16/20 0252  PROCALCITON 0.17 0.10 0.10  --  0.13  --   WBC 22.8* 20.6* 19.5* 24.3*  --  27.1*    Liver Function Tests: Recent Labs  Lab 08/20/2020 2329 08/14/20 0305 08/15/20 0313 08/16/20 0252  AST 20 22 50* 22  ALT 13 18 31 29   ALKPHOS 158* 137* 183* 189*  BILITOT 0.5 0.3 0.5 0.2*  PROT 6.4* 6.6 5.9* 6.0*  ALBUMIN 2.3* 2.3* 2.1* 2.2*   No results for input(s): LIPASE, AMYLASE in the last 168 hours. No results for input(s): AMMONIA in the last 168 hours.  ABG    Component Value Date/Time   PHART 7.399 08/17/2020 2329   PCO2ART 38.3 08/17/2020 2329   PO2ART 76.9 (L) 08/19/2020 2329   HCO3 23.2 08/29/2020 2329   ACIDBASEDEF 0.9 08/25/2020 2329   O2SAT 93.8 08/14/2020 2329     Coagulation Profile: No results for input(s): INR, PROTIME in the last 168  hours.  Cardiac Enzymes: No results for input(s): CKTOTAL, CKMB, CKMBINDEX, TROPONINI in the last 168 hours.  HbA1C: No results found for: HGBA1C  CBG: No results for input(s): GLUCAP in the last 168 hours.    14/01/2020, DO 08/16/20 2:32 PM [facility]

## 2020-08-16 NOTE — Progress Notes (Signed)
PROGRESS NOTE    Kimberly Shelton  HMC:[id number] DOB: Jul 08, 1961 DOA: 08/30/20 PCP: Kristian Covey, MD   Brief Narrative:  59 y.o. WF PMHx  RA-ILD, COPD, HFpEF, depression, anxiety, PUD, and chronic anemia and thrombocytosis,   Presenting to the emergency department for evaluation of shortness of breath and hypoxia.  Patient was discharged from the hospital on 08/06/2020 after treatment for suspected pneumonia, was on 4 L/min of supplemental oxygen at that time.  Since that discharge, she has completed her course of Levaquin, was started on fluconazole for suspected thrush, has developed worsening anxiety and reports oxygen saturation of 80% with slight exertion despite 4 L/min of supplemental oxygen.  She denies any associated chest pain, fevers, or chills.  She denies leg swelling or tenderness but does report some orthopnea.  ED Course: Upon arrival to the ED, patient is found to be afebrile, saturating upper 90s on nonrebreather, tachypneic in the 30s, tachycardic to 130, and with blood pressure as low as 85/53.  EKG features sinus tachycardia with rate 127.  Chest x-ray notable for unchanged multifocal opacities which could reflect pneumonia or edema.  Chemistry panel with normal renal function.  CBC reveals a leukocytosis 22,100, normocytic anemia with hemoglobin 9.5, and thrombocytosis with platelets 489,000.  BNP is elevated 249, up from 90 a week earlier.  Patient was given 40 mg IV Lasix in the ED.   Subjective: 12/11 afebrile overnight patient has continued to decline.  Continuing to require HFNC + NRB + morphine in order to maintain adequate SPO2 and comfort.   Assessment & Plan:  Covid vaccination; vaccinated  Principal Problem:   Acute on chronic respiratory failure with hypoxia (HCC) Active Problems:   Acute on chronic diastolic CHF (congestive heart failure) (HCC)   Rheumatoid arthritis involving multiple joints (HCC)   Interstitial lung disease due to connective  tissue disease (HCC)   COPD (chronic obstructive pulmonary disease) (HCC)   Duodenal ulcer: Per EGD 06/15/2020   Normocytic anemia   Thrombocytosis   Acute on Chronic Diastolic dysfunction  - Patient with ILD, COPD, HFpEF, requiring 4 Lpm supplemental O2 at time of recent discharge presents with increased SOB and saturation of 80% on 4 Lpm and is found to have stable CXR, no fever, and newly elevated BNP  - She was given 40 mg IV Lasix in ED, is diuresing well and reporting some improvement  - Continue diuresis with Lasix 20 mg IV q12h, monitor weight and I/Os   -Strict ins and out -2.4 L -Daily weight Filed Weights   08/14/20 0500 08/15/20 0438 08/16/20 0307  Weight: 60.5 kg 61 kg 61.1 kg    Sinus tachycardia  - HR 120-130 in ED, confirmed sinus on EKG  -Currently NSR.  Sinus tachycardia most likely secondary to her acute on chronic respiratory failure with hypoxia -She was tachycardic during recent admission and ruled-out for PE with CTA on 08/04/20  -12/9 patient's heart rate back in the 110s to 120.  Morphine, plus additional NRB helps. -12/11 despite morphine and benzodiazepines for comfort patient's heart rate back in the 110-120s.  Do not want to increase medication until patient has opportunity to visit with her family.   Acute on Chronic Respiratory failure with hypoxia/COPD/ RA-ILD -States has been seeing Dr. [name] PCCM for 7 years -On Plaquenil for ILD, told to discontinue prednisone at recent discharge -Plaquenil 200 mg BID -Continue 7-day course antibiotics -DuoNeb TID -Solu-Medrol 125 mg QID -Incentive spirometry -Flutter valve -Morphine PRN air hunger -12/11  spoke with Dr. Shanon Rosser PCCM she concurs that patient is imminently terminal and is going to be due to the restrictions on visitation, in order to allow patient's family members to visit greater numbers. -12/11 patient understands she is terminal would like to speak with Dr. [name] palliative care again to  discuss her end-of-life options. Dr. [name] palliative care, aware of patient's situation.  Pulmonary Aspergilloma - Appears stable, no hemoptysis   Rheumatoid arthritis  - Continue Plaquenil    Anemia/ Leukocytosis/Thrombocytosis   - Hgb 9.5 on admission, similar to priors with no active bleeding, she will continue iron supplementation  - Platelets 489k on admission, decreasing from last month and likely reactive  - Leukocytosis secondary maximum doses of steroids.  Negative fever, negative bands, negative left shift.   PUD   - Continue PPI    Suspected thrush  - Continue fluconazole   Anxiety  - Continue low-dose Xanax as needed   Goals of care -12/8 Palliative Care consult; patient wants to continue with current mode of care.  Agrees with DNR.  Recommends SNF rehab with palliative care -12/8 will have charge nurse call daughter in the a.m. and discuss exact current visitation hours and regulations -12/11 spoke at great length with daughter [name], and explained to her that her mother was in her end-of-life phase.     DVT prophylaxis: Lovenox Code Status: DNR Family Communication: 12/11 spoke at length with [name] discussed plan of care answered all questions Status is: Inpatient    Dispo: The patient is from: Home              Anticipated d/c is to: SNF vs hospice              Anticipated d/c date is: 12/13              Patient currently unstable      Consultants:  PCCM   Procedures/Significant Events:  CXR 11/5 >> worsening bilateral infiltrates, lower lobe predominant   I have personally reviewed and interpreted all radiology studies and my findings are as above.  VENTILATOR SETTINGS: HFNC +NRB 12/11 Flow 15 L/min FiO2; 100% SPO2 93%   Cultures COVID 12/5 >> negative Influenza A/B 12/5 >> negative   MRSA PCR 12/7 >> negative  PCT 12/6 >> 0.26   Antimicrobials: Anti-infectives (From admission, onward)   Start     Ordered  Stop   08/12/20 0500  vancomycin (VANCOREADY) IVPB 750 mg/150 mL  Status:  Discontinued        08/11/20 2113 08/12/20 1322   08/11/20 2300  vancomycin (VANCOREADY) IVPB 750 mg/150 mL  Status:  Discontinued        08/11/20 1025 08/11/20 2113   08/11/20 1030  vancomycin (VANCOREADY) IVPB 1250 mg/250 mL        08/11/20 1025 08/11/20 1958   08/11/20 1030  ceFEPIme (MAXIPIME) 2 g in sodium chloride 0.9 % 100 mL IVPB        08/11/20 1025 08-30-2020 1029   08/11/20 1000  fluconazole (DIFLUCAN) tablet 100 mg        08/11/20 0200 08/16/20 0959   08/11/20 1000  hydroxychloroquine (PLAQUENIL) tablet 200 mg        08/11/20 0210         Devices    LINES / TUBES:      Continuous Infusions: . sodium chloride    . ceFEPime (MAXIPIME) IV Stopped (08/16/20 0340)     Objective: Vitals:   08/16/20 0600  08/16/20 0700 08/16/20 0903 08/16/20 0914  BP: (!) 159/69 137/72    Pulse: (!) 110 97    Resp: 17 13    Temp:    (!) 97.4 F (36.3 C)  TempSrc:    Oral  SpO2: 93% (!) 88% 93%   Weight:      Height:        Intake/Output Summary (Last 24 hours) at 08/16/2020 8119 Last data filed at 08/16/2020 1478 Gross per 24 hour  Intake 295.27 ml  Output 1000 ml  Net -704.73 ml   Filed Weights   08/14/20 0500 08/15/20 0438 08/16/20 0307  Weight: 60.5 kg 61 kg 61.1 kg    Examination:  General: A/O x4, positive acute on chronic respiratory distress, cachectic Eyes: negative scleral hemorrhage, negative anisocoria, negative icterus ENT: Negative Runny nose, negative gingival bleeding, Neck:  Negative scars, masses, torticollis, lymphadenopathy, JVD Lungs: tachypneic, decreased breath sounds bilaterally, diffuse crackles, mild expiratory wheeze LEFT>>> RIGHT Cardiovascular: Sinus tachycardia without murmur gallop or rub normal S1 and S2 Abdomen: negative abdominal pain, nondistended, positive soft, bowel sounds, no rebound, no ascites, no appreciable mass Extremities: No significant cyanosis,  clubbing, or edema bilateral lower extremities Skin: Negative rashes, lesions, ulcers Psychiatric:  Negative depression, negative anxiety, negative fatigue, negative mania  Central nervous system:  Cranial nerves II through XII intact, tongue/uvula midline, all extremities muscle strength 5/5, sensation intact throughout, negative dysarthria, negative expressive aphasia, negative receptive aphasia.  .     Data Reviewed: Care during the described time interval was provided by me .  I have reviewed this patient's available data, including medical history, events of note, physical examination, and all test results as part of my evaluation.   CBC: Recent Labs  Lab 08/28/2020 2329 08/11/20 0734 08/12/20 0252 08/13/20 0301 08/14/20 0305 08/15/20 0313 08/16/20 0252  WBC 22.1*   < > 22.8* 20.6* 19.5* 24.3* 27.1*  NEUTROABS 18.4*  --   --   --  17.3* 22.3* 26.1*  HGB 9.5*   < > 9.3* 9.5* 10.4* 9.9* 9.3*  HCT 32.6*   < > 31.8* 32.7* 35.8* 33.7* 32.6*  MCV 80.3   < > 79.3* 80.9 80.4 80.4 82.3  PLT 489*   < > 440* 428* 463* 367 349   < > = values in this interval not displayed.   Basic Metabolic Panel: Recent Labs  Lab 08/11/20 0734 08/12/20 0252 08/13/20 0301 08/14/20 0305 08/15/20 0313 08/16/20 0252  NA 136 136 136 137 140 138  K 3.9 3.4* 4.4 4.9 4.9 4.5  CL 95* 95* 99 99 100 96*  CO2 GLUCOSE 171* 157* 122* 133* 145* 151*  BUN 12 18 23* 21* 20 21*  CREATININE 0.64 0.69 0.53 0.53 0.51 0.36*  CALCIUM 8.5* 8.7* 8.6* 8.8* 8.6* 8.6*  MG 2.6*  --  2.1 2.2 2.0 2.2  PHOS  --   --  3.0  --   --   --    GFR: Estimated Creatinine Clearance: 61.9 mL/min (A) (by C-G formula based on SCr of 0.36 mg/dL (L)). Liver Function Tests: Recent Labs  Lab 08-28-2020 2329 08/14/20 0305 08/15/20 0313 08/16/20 0252  AST 20 22 50* 22  ALT ALKPHOS 158* 137* 183* 189*  BILITOT 0.5 0.3 0.5 0.2*  PROT 6.4* 6.6 5.9* 6.0*  ALBUMIN 2.3* 2.3* 2.1* 2.2*   No results for  input(s): LIPASE, AMYLASE in the last 168 hours. No results for input(s):  AMMONIA in the last 168 hours. Coagulation Profile: No results for input(s): INR, PROTIME in the last 168 hours. Cardiac Enzymes: No results for input(s): CKTOTAL, CKMB, CKMBINDEX, TROPONINI in the last 168 hours. BNP (last 3 results) No results for input(s): PROBNP in the last 8760 hours. HbA1C: No results for input(s): HGBA1C in the last 72 hours. CBG: No results for input(s): GLUCAP in the last 168 hours. Lipid Profile: No results for input(s): CHOL, HDL, LDLCALC, TRIG, CHOLHDL, LDLDIRECT in the last 72 hours. Thyroid Function Tests: No results for input(s): TSH, T4TOTAL, FREET4, T3FREE, THYROIDAB in the last 72 hours. Anemia Panel: No results for input(s): VITAMINB12, FOLATE, FERRITIN, TIBC, IRON, RETICCTPCT in the last 72 hours. Urine analysis:    Component Value Date/Time   COLORURINE YELLOW 06/09/2020 0117   APPEARANCEUR CLEAR 06/09/2020 0117   LABSPEC 1.031 (H) 06/09/2020 0117   PHURINE 6.0 06/09/2020 0117   GLUCOSEU NEGATIVE 06/09/2020 0117   HGBUR NEGATIVE 06/09/2020 0117   BILIRUBINUR NEGATIVE 06/09/2020 0117   BILIRUBINUR n 07/21/2015 1428   KETONESUR 5 (A) 06/09/2020 0117   PROTEINUR 30 (A) 06/09/2020 0117   UROBILINOGEN negative 07/21/2015 1428   UROBILINOGEN 2.0 (H) 02/16/2014 1226   NITRITE NEGATIVE 06/09/2020 0117   LEUKOCYTESUR NEGATIVE 06/09/2020 0117   Sepsis Labs: @LABRCNTIP (procalcitonin:4,lacticidven:4)  ) Recent Results (from the past 240 hour(s))  Resp Panel by RT-PCR (Flu A&B, Covid) Nasopharyngeal Swab     Status: None   Collection Time: 2020/09/05 11:29 PM   Specimen: Nasopharyngeal Swab; Nasopharyngeal(NP) swabs in vial transport medium  Result Value Ref Range Status   SARS Coronavirus 2 by RT PCR NEGATIVE NEGATIVE Final    Comment: (NOTE) SARS-CoV-2 target nucleic acids are NOT DETECTED.  The SARS-CoV-2 RNA is generally detectable in upper respiratory specimens  during the acute phase of infection. The lowest concentration of SARS-CoV-2 viral copies this assay can detect is 138 copies/mL. A negative result does not preclude SARS-Cov-2 infection and should not be used as the sole basis for treatment or other patient management decisions. A negative result may occur with  improper specimen collection/handling, submission of specimen other than nasopharyngeal swab, presence of viral mutation(s) within the areas targeted by this assay, and inadequate number of viral copies(<138 copies/mL). A negative result must be combined with clinical observations, patient history, and epidemiological information. The expected result is Negative.  Fact Sheet for Patients:  14/05/21  Fact Sheet for Healthcare Providers:  BloggerCourse.com  This test is no t yet approved or cleared by the [employer] FDA and  has been authorized for detection and/or diagnosis of SARS-CoV-2 by FDA under an Emergency Use Authorization (EUA). This EUA will remain  in effect (meaning this test can be used) for the duration of the COVID-19 declaration under Section 564(b)(1) of the Act, 21 U.S.C.section 360bbb-3(b)(1), unless the authorization is terminated  or revoked sooner.       Influenza A by PCR NEGATIVE NEGATIVE Final   Influenza B by PCR NEGATIVE NEGATIVE Final    Comment: (NOTE) The Xpert Xpress SARS-CoV-2/FLU/RSV plus assay is intended as an aid in the diagnosis of influenza from Nasopharyngeal swab specimens and should not be used as a sole basis for treatment. Nasal washings and aspirates are unacceptable for Xpert Xpress SARS-CoV-2/FLU/RSV testing.  Fact Sheet for Patients: [place]  Fact Sheet for Healthcare Providers: BloggerCourse.com  This test is not yet approved or cleared by the [employer] FDA and has been authorized for detection  and/or diagnosis of SARS-CoV-2 by FDA  under an Emergency Use Authorization (EUA). This EUA will remain in effect (meaning this test can be used) for the duration of the COVID-19 declaration under Section 564(b)(1) of the Act, 21 U.S.C. section 360bbb-3(b)(1), unless the authorization is terminated or revoked.  Performed at [facility], [address] [place], [place] [address]   MRSA PCR Screening     Status: None   Collection Time: 08/12/20  1:18 AM   Specimen: Nasal Mucosa; Nasopharyngeal  Result Value Ref Range Status   MRSA by PCR NEGATIVE NEGATIVE Final    Comment:        The GeneXpert MRSA Assay (FDA approved for NASAL specimens only), is one component of a comprehensive MRSA colonization surveillance program. It is not intended to diagnose MRSA infection nor to guide or monitor treatment for MRSA infections. Performed at [facility], [address] [place], [place] [address]          Radiology Studies: DG CHEST PORT 1 VIEW  Result Date: 08/15/2020 CLINICAL DATA:  Shortness of breath EXAM: PORTABLE CHEST 1 VIEW COMPARISON:  08/29/20 and prior FINDINGS: Emphysema with prominent biapical bulla. No pneumothorax or pleural effusion. Patchy and confluent bilateral pulmonary opacities are unchanged. Partially obscured cardiomediastinal silhouette. Multilevel spondylosis. IMPRESSION: Unchanged appearance of bilateral pulmonary opacities. Electronically Signed   By: Stana Bunting M.D.   On: 08/15/2020 08:32        Scheduled Meds: . Chlorhexidine Gluconate Cloth  6 each Topical Daily  . enoxaparin (LOVENOX) injection  40 mg Subcutaneous Q24H  . famotidine  20 mg Oral BID  . feeding supplement  237 mL Oral BID BM  . hydroxychloroquine  200 mg Oral BID  . ipratropium-albuterol  3 mL Nebulization TID  . mouth rinse  15 mL Mouth Rinse BID  . methylPREDNISolone (SOLU-MEDROL) injection  125 mg Intravenous Q6H  . morphine   2.5 mg Oral Q4H  . multivitamin with minerals  1 tablet Oral Daily  . pantoprazole  40 mg Oral BID   Continuous Infusions: . sodium chloride    . ceFEPime (MAXIPIME) IV Stopped (08/16/20 0340)     LOS: 5 days   The patient is critically ill with multiple organ systems failure and requires high complexity decision making for assessment and support, frequent evaluation and titration of therapies, application of advanced monitoring technologies and extensive interpretation of multiple databases. Critical Care Time devoted to patient care services described in this note  Time spent: 40 minutes     Jeriann Sayres, Roselind Messier, MD [facility] Pager [phone]  If 7PM-7AM, please contact night-coverage [email or web address] Password [facility] 08/16/2020, 9:17 AM

## 2020-08-17 DIAGNOSIS — R0602 Shortness of breath: Secondary | ICD-10-CM

## 2020-08-17 LAB — CBC WITH DIFFERENTIAL/PLATELET
Abs Immature Granulocytes: 0.39 10*3/uL — ABNORMAL HIGH (ref 0.00–0.07)
Basophils Absolute: 0.1 10*3/uL (ref 0.0–0.1)
Basophils Relative: 0 %
Eosinophils Absolute: 0 10*3/uL (ref 0.0–0.5)
Eosinophils Relative: 0 %
HCT: 36.8 % (ref 36.0–46.0)
Hemoglobin: 10.5 g/dL — ABNORMAL LOW (ref 12.0–15.0)
Immature Granulocytes: 1 %
Lymphocytes Relative: 1 %
Lymphs Abs: 0.3 10*3/uL — ABNORMAL LOW (ref 0.7–4.0)
MCH: 23.5 pg — ABNORMAL LOW (ref 26.0–34.0)
MCHC: 28.5 g/dL — ABNORMAL LOW (ref 30.0–36.0)
MCV: 82.3 fL (ref 80.0–100.0)
Monocytes Absolute: 0.5 10*3/uL (ref 0.1–1.0)
Monocytes Relative: 2 %
Neutro Abs: 28.7 10*3/uL — ABNORMAL HIGH (ref 1.7–7.7)
Neutrophils Relative %: 96 %
Platelets: 391 10*3/uL (ref 150–400)
RBC: 4.47 MIL/uL (ref 3.87–5.11)
RDW: 18 % — ABNORMAL HIGH (ref 11.5–15.5)
WBC: 30 10*3/uL — ABNORMAL HIGH (ref 4.0–10.5)
nRBC: 0.1 % (ref 0.0–0.2)

## 2020-08-17 LAB — COMPREHENSIVE METABOLIC PANEL
ALT: 26 U/L (ref 0–44)
AST: 20 U/L (ref 15–41)
Albumin: 2.3 g/dL — ABNORMAL LOW (ref 3.5–5.0)
Alkaline Phosphatase: 179 U/L — ABNORMAL HIGH (ref 38–126)
Anion gap: 11 (ref 5–15)
BUN: 26 mg/dL — ABNORMAL HIGH (ref 6–20)
CO2: 35 mmol/L — ABNORMAL HIGH (ref 22–32)
Calcium: 8.8 mg/dL — ABNORMAL LOW (ref 8.9–10.3)
Chloride: 93 mmol/L — ABNORMAL LOW (ref 98–111)
Creatinine, Ser: 0.53 mg/dL (ref 0.44–1.00)
GFR, Estimated: >60 mL/min (ref >60)
Glucose, Bld: 159 mg/dL — ABNORMAL HIGH (ref 70–99)
Potassium: 4.8 mmol/L (ref 3.5–5.1)
Sodium: 139 mmol/L (ref 135–145)
Total Bilirubin: 0.3 mg/dL (ref 0.3–1.2)
Total Protein: 6.2 g/dL — ABNORMAL LOW (ref 6.5–8.1)

## 2020-08-17 LAB — MAGNESIUM: Magnesium: 2.1 mg/dL (ref 1.7–2.4)

## 2020-08-17 MED ORDER — LORAZEPAM 2 MG/ML IJ SOLN
1.0000 mg | Freq: Four times a day (QID) | INTRAMUSCULAR | Status: DC | PRN
  Administered 2020-08-17 – 2020-08-18 (×2): 1 mg via INTRAVENOUS
  Filled 2020-08-17 (×2): qty 1

## 2020-08-17 MED ORDER — METOPROLOL TARTRATE 5 MG/5ML IV SOLN
5.0000 mg | Freq: Four times a day (QID) | INTRAVENOUS | Status: DC | PRN
  Administered 2020-08-17: 5 mg via INTRAVENOUS
  Filled 2020-08-17: qty 5

## 2020-08-17 MED ORDER — HYDROMORPHONE HCL 1 MG/ML IJ SOLN
0.5000 mg | INTRAMUSCULAR | Status: DC | PRN
  Administered 2020-08-17 – 2020-08-18 (×2): 0.5 mg via INTRAVENOUS
  Filled 2020-08-17 (×2): qty 1

## 2020-08-17 NOTE — Progress Notes (Signed)
PROGRESS NOTE    Kimberly Shelton  ZOX:[id number] DOB: 05-25-1961 DOA: 09/01/2020 PCP: Kristian Covey, MD   Brief Narrative:  58 y.o. WF PMHx  RA-ILD, COPD, HFpEF, depression, anxiety, PUD, and chronic anemia and thrombocytosis,   Presenting to the emergency department for evaluation of shortness of breath and hypoxia.  Patient was discharged from the hospital on 08/06/2020 after treatment for suspected pneumonia, was on 4 L/min of supplemental oxygen at that time.  Since that discharge, she has completed her course of Levaquin, was started on fluconazole for suspected thrush, has developed worsening anxiety and reports oxygen saturation of 80% with slight exertion despite 4 L/min of supplemental oxygen.  She denies any associated chest pain, fevers, or chills.  She denies leg swelling or tenderness but does report some orthopnea.  ED Course: Upon arrival to the ED, patient is found to be afebrile, saturating upper 90s on nonrebreather, tachypneic in the 30s, tachycardic to 130, and with blood pressure as low as 85/53.  EKG features sinus tachycardia with rate 127.  Chest x-ray notable for unchanged multifocal opacities which could reflect pneumonia or edema.  Chemistry panel with normal renal function.  CBC reveals a leukocytosis 22,100, normocytic anemia with hemoglobin 9.5, and thrombocytosis with platelets 489,000.  BNP is elevated 249, up from 90 a week earlier.  Patient was given 40 mg IV Lasix in the ED.   Subjective: 12/12 afebrile overnight patient sleepy but arousable, states she is comfortable.  Still requiring HFNC + NRB+ morphine to stay comfortable.   Assessment & Plan:  Covid vaccination; vaccinated  Principal Problem:   Acute on chronic respiratory failure with hypoxia (HCC) Active Problems:   Acute on chronic diastolic CHF (congestive heart failure) (HCC)   Rheumatoid arthritis involving multiple joints (HCC)   Interstitial lung disease due to connective tissue disease  (HCC)   COPD (chronic obstructive pulmonary disease) (HCC)   Duodenal ulcer: Per EGD 06/15/2020   Normocytic anemia   Thrombocytosis   Palliative care by specialist   Goals of care, counseling/discussion   General weakness   Acute on Chronic Diastolic dysfunction  - Patient with ILD, COPD, HFpEF, requiring 4 Lpm supplemental O2 at time of recent discharge presents with increased SOB and saturation of 80% on 4 Lpm and is found to have stable CXR, no fever, and newly elevated BNP  - She was given 40 mg IV Lasix in ED, is diuresing well and reporting some improvement  - Continue diuresis with Lasix 20 mg IV q12h, monitor weight and I/Os   -Strict ins and out -2.4 L -Daily weight Filed Weights   08/15/20 0438 08/16/20 0307 08/17/20 0436  Weight: 61 kg 61.1 kg 61 kg    Sinus tachycardia  - HR 120-130 in ED, confirmed sinus on EKG  -Currently NSR.  Sinus tachycardia most likely secondary to her acute on chronic respiratory failure with hypoxia -She was tachycardic during recent admission and ruled-out for PE with CTA on 08/04/20  -12/9 patient's heart rate back in the 110s to 120.  Morphine, plus additional NRB helps. -12/11 despite morphine and benzodiazepines for comfort patient's heart rate back in the 110-120s.  Do not want to increase medication until patient has opportunity to visit with her family. -12/12 Metoprolol PRN HR>105   Acute on Chronic Respiratory failure with hypoxia/COPD/ RA-ILD -States has been seeing Dr. [name] PCCM for 7 years -On Plaquenil for ILD, told to discontinue prednisone at recent discharge -Plaquenil 200 mg BID -Continue 7-day course  antibiotics -DuoNeb TID -Solu-Medrol 125 mg QID -Incentive spirometry -Flutter valve -Morphine PRN air hunger -12/11 spoke with Dr. Shanon Rosser PCCM she concurs that patient is imminently terminal and is going to be due to the restrictions on visitation, in order to allow patient's family members to visit greater  numbers. -12/11 patient understands she is terminal would like to speak with Dr. [name] palliative care again to discuss her end-of-life options. Dr. [name] palliative care, aware of patient's situation. -12/12 comfort care approach after speaking with Dr. Shanon Rosser yesterday and today.  Patient and family understand that patient is terminal.  Will discontinue all unnecessary diagnostic testing and focus on ensuring patient's comfort.  Pulmonary Aspergilloma - Appears stable, no hemoptysis   Rheumatoid arthritis  - Continue Plaquenil    Anemia/ Leukocytosis/Thrombocytosis   - Hgb 9.5 on admission, similar to priors with no active bleeding, she will continue iron supplementation  - Platelets 489k on admission, decreasing from last month and likely reactive  - Leukocytosis secondary maximum doses of steroids.  Negative fever, negative bands, negative left shift.  -12/12 comfort care approach  PUD   - Continue PPI    Suspected thrush  - Continue fluconazole   Anxiety  - Continue low-dose Xanax as needed   Goals of care -12/8 Palliative Care consult; patient wants to continue with current mode of care.  Agrees with DNR.  Recommends SNF rehab with palliative care -12/8 will have charge nurse call daughter in the a.m. and discuss exact current visitation hours and regulations -12/11 spoke at great length with daughter [name], and explained to her that her mother was in her end-of-life phase.  -12/12 comfort care approach   DVT prophylaxis: Lovenox Code Status: DNR Family Communication: 12/11 spoke at length with [name] discussed plan of care answered all questions Status is: Inpatient    Dispo: The patient is from: Home              Anticipated d/c is to: SNF vs hospice              Anticipated d/c date is: 12/13              Patient currently unstable      Consultants:  PCCM   Procedures/Significant Events:  CXR 11/5 >> worsening bilateral infiltrates,  lower lobe predominant   I have personally reviewed and interpreted all radiology studies and my findings are as above.  VENTILATOR SETTINGS: HFNC +NRB 12/12 Flow 15 L/min FiO2; 100% SPO2 95%   Cultures COVID 12/5 >> negative Influenza A/B 12/5 >> negative   MRSA PCR 12/7 >> negative  PCT 12/6 >> 0.26   Antimicrobials: Anti-infectives (From admission, onward)   Start     Ordered Stop   08/12/20 0500  vancomycin (VANCOREADY) IVPB 750 mg/150 mL  Status:  Discontinued        08/11/20 2113 08/12/20 1322   08/11/20 2300  vancomycin (VANCOREADY) IVPB 750 mg/150 mL  Status:  Discontinued        08/11/20 1025 08/11/20 2113   08/11/20 1030  vancomycin (VANCOREADY) IVPB 1250 mg/250 mL        08/11/20 1025 08/11/20 1958   08/11/20 1030  ceFEPIme (MAXIPIME) 2 g in sodium chloride 0.9 % 100 mL IVPB        08/11/20 1025 08-25-20 1029   08/11/20 1000  fluconazole (DIFLUCAN) tablet 100 mg        08/11/20 0200 08/16/20 0959   08/11/20 1000  hydroxychloroquine (PLAQUENIL)  tablet 200 mg        08/11/20 0210         Devices    LINES / TUBES:      Continuous Infusions: . sodium chloride    . ceFEPime (MAXIPIME) IV Stopped (08/17/20 0203)     Objective: Vitals:   08/17/20 0000 08/17/20 0436 08/17/20 0500 08/17/20 0804  BP: 125/64  135/64 (!) 164/99  Pulse: (!) 107  (!) 113 (!) 112  Resp: 11  15 16   Temp:      TempSrc:      SpO2: 98%  98% 98%  Weight:  61 kg    Height:        Intake/Output Summary (Last 24 hours) at 08/17/2020 14/08/2020 Last data filed at 08/16/2020 1700 Gross per 24 hour  Intake 239.04 ml  Output 350 ml  Net -110.96 ml   Filed Weights   08/15/20 0438 08/16/20 0307 08/17/20 0436  Weight: 61 kg 61.1 kg 61 kg    Examination:  General: A/O x4, positive acute on chronic respiratory distress, cachectic Eyes: negative scleral hemorrhage, negative anisocoria, negative icterus ENT: Negative Runny nose, negative gingival bleeding, Neck:  Negative scars,  masses, torticollis, lymphadenopathy, JVD Lungs: tachypneic, decreased breath sounds bilaterally,  Cardiovascular: Sinus tachycardia without murmur gallop or rub normal S1 and S2 Abdomen: negative abdominal pain, nondistended, positive soft, bowel sounds, no rebound, no ascites, no appreciable mass Extremities: No significant cyanosis, clubbing, or edema bilateral lower extremities Skin: Negative rashes, lesions, ulcers Psychiatric:  Negative depression, negative anxiety, negative fatigue, negative mania  Central nervous system:  Cranial nerves II through XII intact, tongue/uvula midline, all extremities muscle strength 5/5, sensation intact throughout, negative dysarthria, negative expressive aphasia, negative receptive aphasia.  .     Data Reviewed: Care during the described time interval was provided by me .  I have reviewed this patient's available data, including medical history, events of note, physical examination, and all test results as part of my evaluation.   CBC: Recent Labs  Lab 08/28/20 2329 08/11/20 0734 08/13/20 0301 08/14/20 0305 08/15/20 0313 08/16/20 0252 08/17/20 0310  WBC 22.1*   < > 20.6* 19.5* 24.3* 27.1* 30.0*  NEUTROABS 18.4*  --   --  17.3* 22.3* 26.1* 28.7*  HGB 9.5*   < > 9.5* 10.4* 9.9* 9.3* 10.5*  HCT 32.6*   < > 32.7* 35.8* 33.7* 32.6* 36.8  MCV 80.3   < > 80.9 80.4 80.4 82.3 82.3  PLT 489*   < > 428* 463* 367 349 391   < > = values in this interval not displayed.   Basic Metabolic Panel: Recent Labs  Lab 08/13/20 0301 08/14/20 0305 08/15/20 0313 08/16/20 0252 08/17/20 0310  NA 136 137 140 138 139  K 4.4 4.9 4.9 4.5 4.8  CL 99 99 100 96* 93*  CO2 26 27 30 31  35*  GLUCOSE 122* 133* 145* 151* 159*  BUN 23* 21* 20 21* 26*  CREATININE 0.53 0.53 0.51 0.36* 0.53  CALCIUM 8.6* 8.8* 8.6* 8.6* 8.8*  MG 2.1 2.2 2.0 2.2 2.1  PHOS 3.0  --   --   --   --    GFR: Estimated Creatinine Clearance: 61.9 mL/min (by C-G formula based on SCr of 0.53  mg/dL). Liver Function Tests: Recent Labs  Lab 08-28-2020 2329 08/14/20 0305 08/15/20 0313 08/16/20 0252 08/17/20 0310  AST 20 22 50* 22 20  ALT 13 18 31 29 26   ALKPHOS 158* 137* 183* 189* 179*  BILITOT 0.5 0.3 0.5 0.2* 0.3  PROT 6.4* 6.6 5.9* 6.0* 6.2*  ALBUMIN 2.3* 2.3* 2.1* 2.2* 2.3*   No results for input(s): LIPASE, AMYLASE in the last 168 hours. No results for input(s): AMMONIA in the last 168 hours. Coagulation Profile: No results for input(s): INR, PROTIME in the last 168 hours. Cardiac Enzymes: No results for input(s): CKTOTAL, CKMB, CKMBINDEX, TROPONINI in the last 168 hours. BNP (last 3 results) No results for input(s): PROBNP in the last 8760 hours. HbA1C: No results for input(s): HGBA1C in the last 72 hours. CBG: No results for input(s): GLUCAP in the last 168 hours. Lipid Profile: No results for input(s): CHOL, HDL, LDLCALC, TRIG, CHOLHDL, LDLDIRECT in the last 72 hours. Thyroid Function Tests: No results for input(s): TSH, T4TOTAL, FREET4, T3FREE, THYROIDAB in the last 72 hours. Anemia Panel: No results for input(s): VITAMINB12, FOLATE, FERRITIN, TIBC, IRON, RETICCTPCT in the last 72 hours. Urine analysis:    Component Value Date/Time   COLORURINE YELLOW 06/09/2020 0117   APPEARANCEUR CLEAR 06/09/2020 0117   LABSPEC 1.031 (H) 06/09/2020 0117   PHURINE 6.0 06/09/2020 0117   GLUCOSEU NEGATIVE 06/09/2020 0117   HGBUR NEGATIVE 06/09/2020 0117   BILIRUBINUR NEGATIVE 06/09/2020 0117   BILIRUBINUR n 07/21/2015 1428   KETONESUR 5 (A) 06/09/2020 0117   PROTEINUR 30 (A) 06/09/2020 0117   UROBILINOGEN negative 07/21/2015 1428   UROBILINOGEN 2.0 (H) 02/16/2014 1226   NITRITE NEGATIVE 06/09/2020 0117   LEUKOCYTESUR NEGATIVE 06/09/2020 0117   Sepsis Labs: (procalcitonin:4,lacticidven:4)  ) Recent Results (from the past 240 hour(s))  Resp Panel by RT-PCR (Flu A&B, Covid) Nasopharyngeal Swab     Status: None   Collection Time: 2020-09-09 11:29 PM    Specimen: Nasopharyngeal Swab; Nasopharyngeal(NP) swabs in vial transport medium  Result Value Ref Range Status   SARS Coronavirus 2 by RT PCR NEGATIVE NEGATIVE Final    Comment: (NOTE) SARS-CoV-2 target nucleic acids are NOT DETECTED.  The SARS-CoV-2 RNA is generally detectable in upper respiratory specimens during the acute phase of infection. The lowest concentration of SARS-CoV-2 viral copies this assay can detect is 138 copies/mL. A negative result does not preclude SARS-Cov-2 infection and should not be used as the sole basis for treatment or other patient management decisions. A negative result may occur with  improper specimen collection/handling, submission of specimen other than nasopharyngeal swab, presence of viral mutation(s) within the areas targeted by this assay, and inadequate number of viral copies(<138 copies/mL). A negative result must be combined with clinical observations, patient history, and epidemiological information. The expected result is Negative.  Fact Sheet for Patients:  BloggerCourse.com  Fact Sheet for Healthcare Providers:  SeriousBroker.it  This test is no t yet approved or cleared by the [place] FDA and  has been authorized for detection and/or diagnosis of SARS-CoV-2 by FDA under an Emergency Use Authorization (EUA). This EUA will remain  in effect (meaning this test can be used) for the duration of the COVID-19 declaration under Section 564(b)(1) of the Act, 21 U.S.C.section 360bbb-3(b)(1), unless the authorization is terminated  or revoked sooner.       Influenza A by PCR NEGATIVE NEGATIVE Final   Influenza B by PCR NEGATIVE NEGATIVE Final    Comment: (NOTE) The Xpert Xpress SARS-CoV-2/FLU/RSV plus assay is intended as an aid in the diagnosis of influenza from Nasopharyngeal swab specimens and should not be used as a sole basis for treatment. Nasal washings and aspirates are  unacceptable for Xpert Xpress SARS-CoV-2/FLU/RSV testing.  Fact  Sheet for Patients: BloggerCourse.com  Fact Sheet for Healthcare Providers: SeriousBroker.it  This test is not yet approved or cleared by the [place] FDA and has been authorized for detection and/or diagnosis of SARS-CoV-2 by FDA under an Emergency Use Authorization (EUA). This EUA will remain in effect (meaning this test can be used) for the duration of the COVID-19 declaration under Section 564(b)(1) of the Act, 21 U.S.C. section 360bbb-3(b)(1), unless the authorization is terminated or revoked.  Performed at  [facility], [address], [place], [place] [address]   MRSA PCR Screening     Status: None   Collection Time: 08/12/20  1:18 AM   Specimen: Nasal Mucosa; Nasopharyngeal  Result Value Ref Range Status   MRSA by PCR NEGATIVE NEGATIVE Final    Comment:        The GeneXpert MRSA Assay (FDA approved for NASAL specimens only), is one component of a comprehensive MRSA colonization surveillance program. It is not intended to diagnose MRSA infection nor to guide or monitor treatment for MRSA infections. Performed at [facility], [address], [place], [place] [address]          Radiology Studies: DG CHEST PORT 1 VIEW  Result Date: 08/15/2020 CLINICAL DATA:  Shortness of breath EXAM: PORTABLE CHEST 1 VIEW COMPARISON:  09-06-2020 and prior FINDINGS: Emphysema with prominent biapical bulla. No pneumothorax or pleural effusion. Patchy and confluent bilateral pulmonary opacities are unchanged. Partially obscured cardiomediastinal silhouette. Multilevel spondylosis. IMPRESSION: Unchanged appearance of bilateral pulmonary opacities. Electronically Signed   By: Stana Bunting M.D.   On: 08/15/2020 08:32        Scheduled Meds: . Chlorhexidine Gluconate Cloth  6 each Topical Daily  . enoxaparin (LOVENOX)  injection  40 mg Subcutaneous Q24H  . famotidine  20 mg Oral BID  . feeding supplement  237 mL Oral BID BM  . hydroxychloroquine  200 mg Oral BID  . ipratropium-albuterol  3 mL Nebulization TID  . mouth rinse  15 mL Mouth Rinse BID  . methylPREDNISolone (SOLU-MEDROL) injection  125 mg Intravenous Q6H  . morphine  2.5 mg Oral Q4H  . multivitamin with minerals  1 tablet Oral Daily  . pantoprazole  40 mg Oral BID   Continuous Infusions: . sodium chloride    . ceFEPime (MAXIPIME) IV Stopped (08/17/20 0203)     LOS: 6 days   The patient is critically ill with multiple organ systems failure and requires high complexity decision making for assessment and support, frequent evaluation and titration of therapies, application of advanced monitoring technologies and extensive interpretation of multiple databases. Critical Care Time devoted to patient care services described in this note  Time spent: 40 minutes     Lareta Bruneau, Roselind Messier, MD [facility] Pager [phone]  If 7PM-7AM, please contact night-coverage [email or web address] Password [facility] 08/17/2020, 8:24 AM

## 2020-08-17 NOTE — Progress Notes (Signed)
Daily Progress Note   Patient Name: Kimberly Shelton       Date: 08/17/2020 DOB: November 11, 1960  Age: 59 y.o. MRN#: [id number] Attending Physician: Drema Dallas, MD Primary Care Physician: Kristian Covey, MD Admit Date: 08/13/2020  Reason for Consultation/Follow-up: Establishing goals of care  Subjective: Patient still awakens and tries to interact, she appears more tired, 2 sisters are at the bedside, they have questions about patient's condition, her current treatment and nutrition. 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 We talked about the patient's serious illness and her ongoing decline and our efforts towards symptom management. We also discussed about artificial nutrition and hydration at end of life. See below.   Length of Stay: 6  Current Medications: Scheduled Meds:   Chlorhexidine Gluconate Cloth  6 each Topical Daily   famotidine  20 mg Oral BID   feeding supplement  237 mL Oral BID BM   hydroxychloroquine  200 mg Oral BID   mouth rinse  15 mL Mouth Rinse BID   methylPREDNISolone (SOLU-MEDROL) injection  125 mg Intravenous Q6H   morphine  2.5 mg Oral Q4H   pantoprazole  40 mg Oral BID    Continuous Infusions:  sodium chloride     ceFEPime (MAXIPIME) IV 2 g (08/17/20 1211)    PRN Meds: sodium chloride, acetaminophen **OR** acetaminophen, albuterol, ALPRAZolam, HYDROmorphone (DILAUDID) injection, metoprolol tartrate, morphine injection, ondansetron **OR** ondansetron (ZOFRAN) IV, senna-docusate, sodium chloride  Physical Exam         Appears chronically ill Sitting up in bed Has dry oral mucosa possibly also has discolored lips Not as awake, has respiratory distress Diminished breath sounds No  edema Tachycardic on the monitor Abdomen not distended  Vital Signs: BP (!) 164/99 (BP Location: Left Arm)    Pulse (!) 112    Temp 97.6 F (36.4 C) (Oral)    Resp 16    Ht 5\' 3"  (1.6 m)    Wt 61 kg    LMP 09/06/2004    SpO2 98%    BMI 23.82 kg/m  SpO2: SpO2: 98 % O2 Device: O2 Device: High Flow Nasal Cannula,NRB O2 Flow Rate: O2 Flow Rate (L/min): 15 L/min  Intake/output summary:   Intake/Output Summary (Last 24 hours) at 08/17/2020 1256 Last  data filed at 08/16/2020 1700 Gross per 24 hour  Intake 20 ml  Output 350 ml  Net -330 ml   LBM: Last BM Date: 08/23/20 Baseline Weight: Weight: 62.9 kg Most recent weight: Weight: 61 kg       Palliative Assessment/Data:      Patient Active Problem List   Diagnosis Date Noted   Palliative care by specialist    Goals of care, counseling/discussion    General weakness    Acute on chronic respiratory failure with hypoxia (HCC) 08/11/2020   Normocytic anemia 08/11/2020   Thrombocytosis 08/11/2020   Gastric ulcer: Per EGD 06/15/2020 08/05/2020   Duodenal ulcer: Per EGD 06/15/2020 08/05/2020   [place] grade D esophagitis: The EGD 06/15/2020 08/05/2020   COPD (chronic obstructive pulmonary disease) (HCC) 08/04/2020   Upper GI bleed    Atrial fibrillation (HCC)    Acute calculous cholecystitis 06/09/2020   Nausea & vomiting 06/09/2020   Hiatal hernia 06/09/2020   Immunosuppression due to drug therapy (HCC) 06/09/2020   Conductive hearing loss of both ears 06/09/2020   Emphysema of lung (HCC) 06/09/2020   Interstitial lung disease due to connective tissue disease (HCC) 06/09/2020   Coronary artery calcification of native artery 06/09/2020   Bronchiectasis (HCC) 06/09/2020   Cholecystitis 06/09/2020   Constipation 08/13/2019   Chronic Hemoptysis from cavitary Aspergillosis 08/01/2019   Medication monitoring encounter 03/20/2019   Aspergillosis (HCC) 03/14/2019   Former smoker 03/13/2019    Referred otalgia of both ears 03/13/2019   Cavitary lung disease 02/27/2018   Aortic atherosclerosis (HCC) 12/29/2017   Atrial tachycardia (HCC) 12/29/2017   Atrial septal aneurysm 12/28/2017   History of pneumonia 02/09/2016   History of smoking 02/09/2016   CIN III (cervical intraepithelial neoplasia III) 09/21/2015   Rheumatoid arthritis involving multiple joints (HCC) 08/09/2014   Acute on chronic diastolic CHF (congestive heart failure) (HCC) 02/19/2014   DEPRESSION 03/26/2009   CARPAL TUNNEL SYNDROME 03/26/2009   ARTHRALGIA 03/26/2009    Palliative Care Assessment & Plan   Patient Profile:    Assessment: Acute on chronic hypoxic respiratory failure due to interstitial lung disease flare Air hunger, generalized anxiety, shortness of breath  Recommendations/Plan: Discussed with patient's sisters about continuing appropriate symptom management and that the patient has ongoing decline from a pulmonary standpoint.    Plan: Add IV Dilaudid PRN, in case patient no longer able to take in PO opioids. Med history noted.  Comfort feeds as per family request.  Prognosis hours to some very limited number of days.  Anticipated hospital death.   Goals of Care and Additional Recommendations: Limitations on Scope of Treatment: Full Comfort Care  Code Status:    Code Status Orders  (From admission, onward)         Start     Ordered   08/11/20 0155  Do not attempt resuscitation (DNR)  Continuous       Question Answer Comment  In the event of cardiac or respiratory ARREST Do not call a code blue   In the event of cardiac or respiratory ARREST Do not perform Intubation, CPR, defibrillation or ACLS   In the event of cardiac or respiratory ARREST Use medication by any route, position, wound care, and other measures to relive pain and suffering. May use oxygen, suction and manual treatment of airway obstruction as needed for comfort.      08/11/20 0156        Code  Status History    Date Active Date Inactive Code  Status Order ID Comments User Context   08/04/2020 1700 08/06/2020 2130 DNR [id number]  Jae Dire, MD Inpatient   06/09/2020 0829 06/16/2020 2202 Full Code [id number]  Jae Dire, MD ED   02/16/2014 1606 02/24/2014 1746 Full Code [id number]  Dorothea Ogle, MD Inpatient   Advance Care Planning Activity      Prognosis:  Hours - Days  Discharge Planning: Anticipated Hospital Death  Thank you for allowing the Palliative Medicine Team to assist in the care of this patient.   Time In: 12.25 Time Out: 1300 Total Time 35 Prolonged Time Billed  no       Greater than 50%  of this time was spent counseling and coordinating care related to the above assessment and plan.  Rosalin Hawking, MD  Please contact Palliative Medicine Team phone at [phone] for questions and concerns.

## 2020-08-18 MED ORDER — HYDROMORPHONE HCL 1 MG/ML IJ SOLN: 0.5000 mg | INTRAMUSCULAR | Status: DC | PRN

## 2020-08-22 NOTE — Telephone Encounter (Signed)
Pt is deceased. Will close encounter.  °

## 2020-09-06 NOTE — TOC Progression Note (Signed)
Transition of Care [facility]) - Progression Note    Patient Details  Name: Kimberly Shelton MRN: [id number] Date of Birth: 04-04-1961  [facility] CM/SW Contact  Golda Acre, RN Phone Number: 08/07/2020, 10:30 AM  Clinical Narrative:    Assessment: Acute on chronic hypoxic respiratory failure due to interstitial lung disease flare Air hunger, generalized anxiety, shortness of breath  Recommendations/Plan: Discussed with patient's sisters about continuing appropriate symptom management and that the patient has ongoing decline from a pulmonary standpoint.    Plan: Add IV Dilaudid PRN, in case patient no longer able to take in PO opioids. Med history noted.  Comfort feeds as per family request.  Prognosis hours to some very limited number of days.  Anticipated hospital death.         Expected Discharge Plan and Services           Expected Discharge Date:  (unknown)                                     Social Determinants of Health (SDOH) Interventions    Readmission Risk Interventions No flowsheet data found.

## 2020-09-06 NOTE — Death Summary Note (Signed)
Death Summary  Kimberly Shelton IRS:[id number] DOB: 1960-11-30 DOA: 08-13-2020  PCP: Kimberly Covey, MD PCP/Office notified: No  Admit date: 2020-08-13 Date of Death: August 21, 2020  Final Diagnoses:  Principal Problem:   Acute on chronic respiratory failure with hypoxia (HCC) Active Problems:   Acute on chronic diastolic CHF (congestive heart failure) (HCC)   Rheumatoid arthritis involving multiple joints (HCC)   Interstitial lung disease due to connective tissue disease (HCC)   COPD (chronic obstructive pulmonary disease) (HCC)   Duodenal ulcer: Per EGD 06/15/2020   Normocytic anemia   Thrombocytosis   Palliative care by specialist   Goals of care, counseling/discussion   General weakness   Shortness of breath  Acute on Chronic Diastolic dysfunction -Patient with ILD, COPD, HFpEF, requiring 4 Lpm supplemental O2 at time of recent discharge presents with increased SOB and saturation of 80% on 4 Lpm and is found to have stable CXR, no fever, and newly elevated BNP -She was given 40 mg IV Lasix in ED, is diuresing well and reporting some improvement -Continue diuresis with Lasix 20 mg IV q12h, monitor weight and I/Os -Strict ins and out -2.4 L  Sinus tachycardia  - HR 120-130 in ED, confirmed sinus on EKG  -Currently NSR.  Sinus tachycardia most likely secondary to her acute on chronic respiratory failure with hypoxia -She was tachycardic during recent admission and ruled-out for PE with CTA on 08/04/20  -12/9 patient's heart rate back in the 110s to 120.  Morphine, plus additional NRB helps. -12/11 despite morphine and benzodiazepines for comfort patient's heart rate back in the 110-120s.  Do not want to increase medication until patient has opportunity to visit with her family. -12/12 Metoprolol PRN HR>105   Acute on Chronic Respiratory failure with hypoxia/COPD/ RA-ILD -States has been seeing Dr. Marchelle Shelton PCCM for 7 years -On Plaquenil for ILD, told to discontinue  prednisone at recent discharge -Plaquenil 200 mg BID -Continue 7-day course antibiotics -DuoNeb TID -Solu-Medrol 125 mg QID -Incentive spirometry -Flutter valve -Morphine PRN air hunger -12/11 spoke with Dr. Shanon Shelton PCCM she concurs that patient is imminently terminal and is going to be due to the restrictions on visitation, in order to allow patient's family members to visit greater numbers. -12/11 patient understands she is terminal would like to speak with Dr. Linna Shelton palliative care again to discuss her end-of-life options. Dr. Linna Shelton palliative care, aware of patient's situation. -12/12 comfort care approach after speaking with Dr. Shanon Shelton yesterday and today.  Patient and family understand that patient is terminal.  Will discontinue all unnecessary diagnostic testing and focus on ensuring patient's comfort. -Despite maximal treatment patient respiratory status continued to decline and as mentioned above patient decided on comfort care.  Pulmonary Aspergilloma - Appears stable, no hemoptysis   Rheumatoid arthritis -Continue Plaquenil  Anemia/ Leukocytosis/Thrombocytosis -Hgb 9.5 on admission, similar to priors with no active bleeding, she will continue iron supplementation -Platelets 489k on admission, decreasing from last month and likely reactive -Leukocytosis secondary maximum doses of steroids.  Negative fever, negative bands, negative left shift.  -12/12 comfort care approach  PUD -Continue PPI  Suspected thrush -Continue fluconazole   Anxiety  - Continue low-dose Xanax as needed   Goals of care -12/8 Palliative Care consult; patient wants to continue with current mode of care.  Agrees with DNR.  Recommends SNF rehab with palliative care -12/8 will have charge nurse call daughter in the a.m. and discuss exact current visitation hours and regulations -12/11 spoke at great length with daughter  Kimberly Shelton, and explained to her that her mother was in her  end-of-life phase.  -12/12 comfort care approach  History of present illness:  60 y.o.WF PMHx RA-ILD,COPD,HFpEF,depression, anxiety, PUD, and chronic anemia and thrombocytosis,   Presenting to the emergency department for evaluation of shortness of breath and hypoxia. Patient was discharged from the hospital on 08/06/2020 after treatment for suspected pneumonia, was on 4 L/min of supplemental oxygen at that time.Since that discharge, she has completed her course of Levaquin, was started on fluconazole for suspected thrush, has developed worsening anxiety and reports oxygen saturation of 80% with slight exertion despite 4 L/min of supplemental oxygen. She denies any associated chest pain, fevers, or chills. She denies leg swelling or tenderness but does report some orthopnea.  ED Course:Upon arrival to the ED, patient is found to be afebrile, saturating upper 90s on nonrebreather, tachypneic in the 30s, tachycardic to 130, and with blood pressure as low as 85/53. EKG features sinus tachycardia with rate 127. Chest x-ray notable for unchanged multifocal opacities which could reflect pneumonia or edema. Chemistry panel with normal renal function. CBC reveals a leukocytosis 22,100, normocytic anemia with hemoglobin 9.5, and thrombocytosis with platelets 489,000. BNP is elevated 249, up from 90 a week earlier. Patient was given 40 mg IV Lasix in the ED.  Hospital Course:  See above   Time: 0546  Signed:  Carolyne Littles, MD [facility] [phone]

## 2020-09-06 NOTE — Telephone Encounter (Signed)
Lmtcb for Kristie.  

## 2020-09-06 DEATH — deceased

## 2020-11-07 ENCOUNTER — Other Ambulatory Visit: Payer: 59

## 2020-11-25 IMAGING — MG DIGITAL SCREENING BILATERAL MAMMOGRAM WITH TOMO AND CAD
8 series · 9 of 24 positions shown · non-contrast
Comparison: Previous exam(s).

CLINICAL DATA: Screening.

EXAM:
DIGITAL SCREENING BILATERAL MAMMOGRAM WITH TOMO AND CAD

[R CC synth-2D]
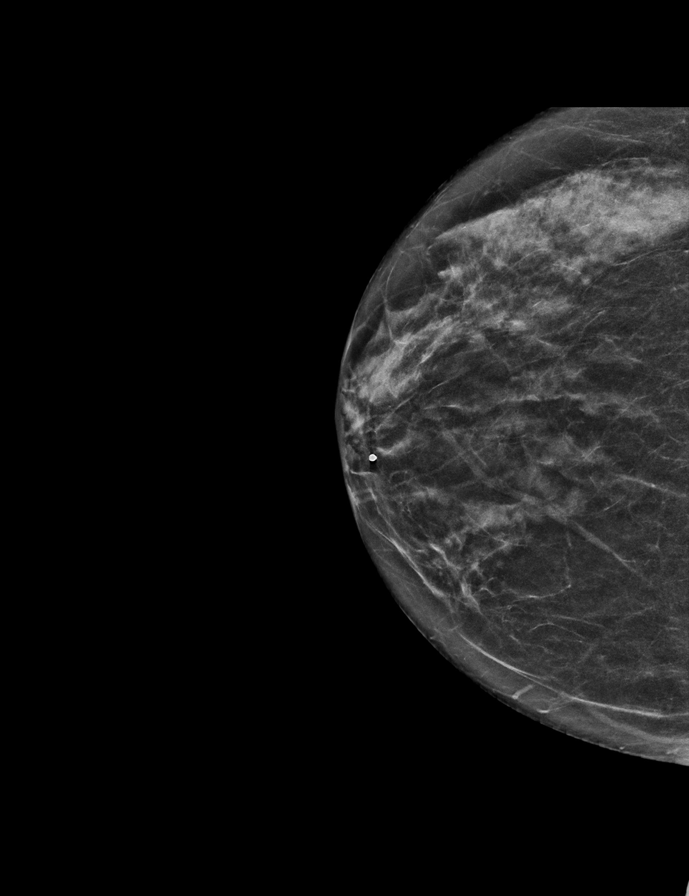

[R MLO synth-2D]
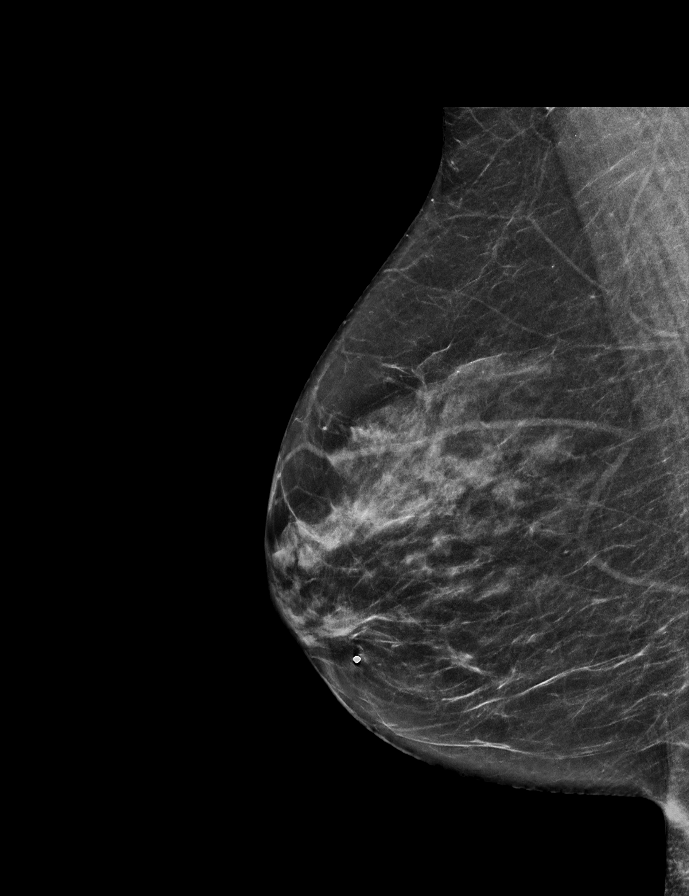

[L CC synth-2D]
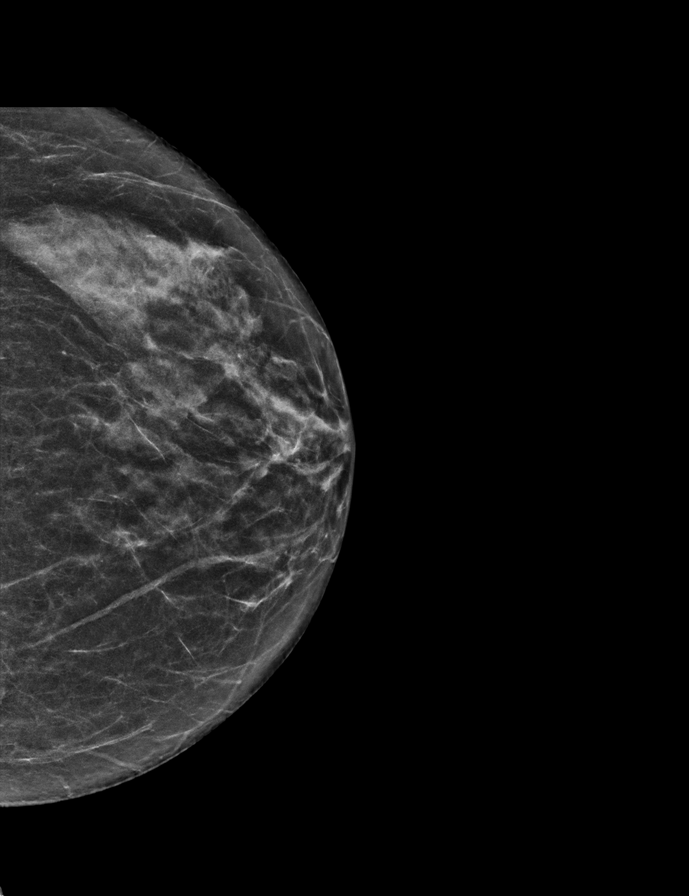

[L MLO synth-2D]
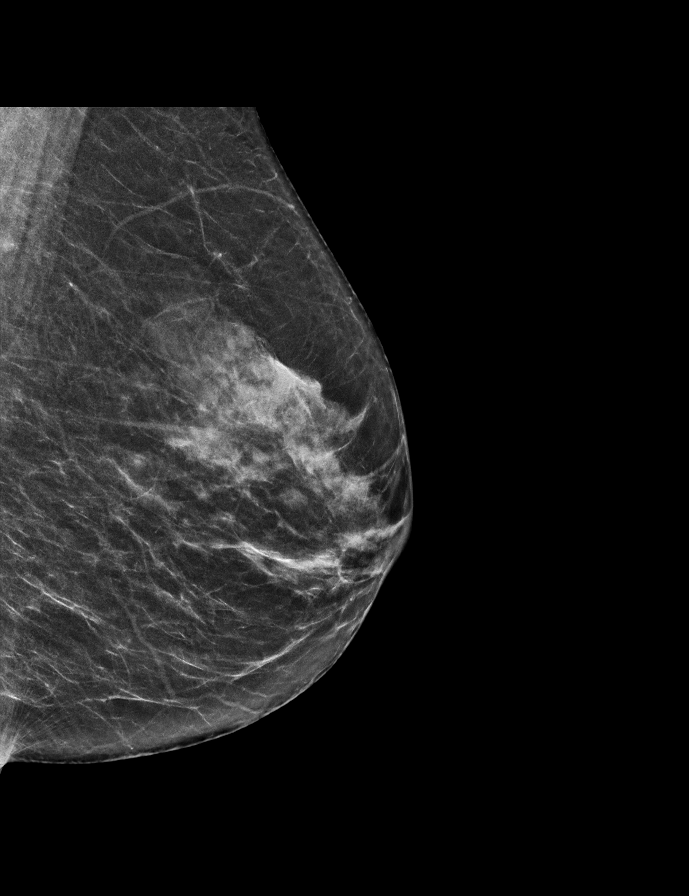

[L MLO tomo · 2 of 59 frames shown]
[frame 20/59]
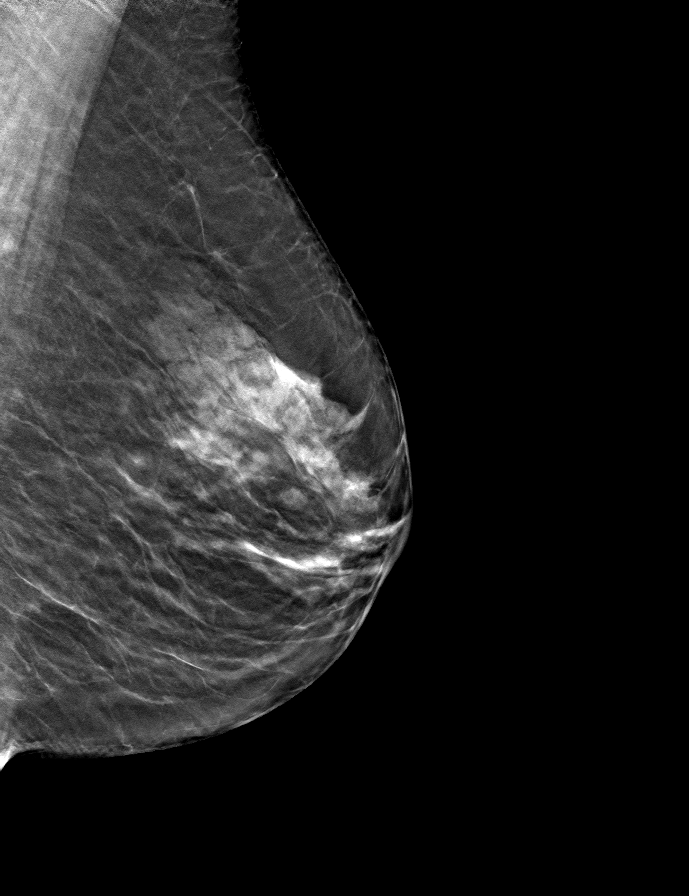
[frame 30/59]
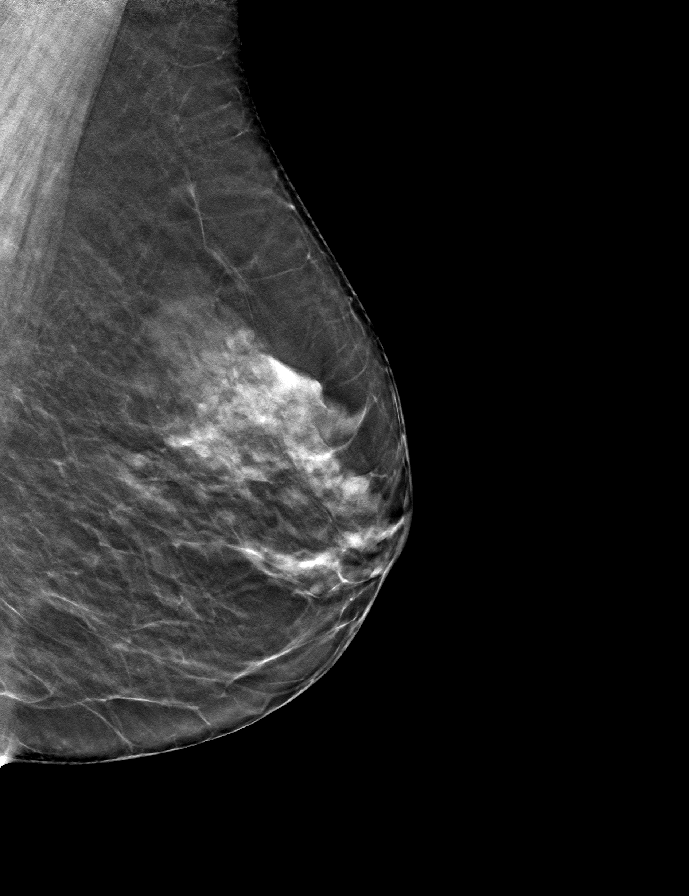

[R CC tomo · tomo slice 26/51.0]
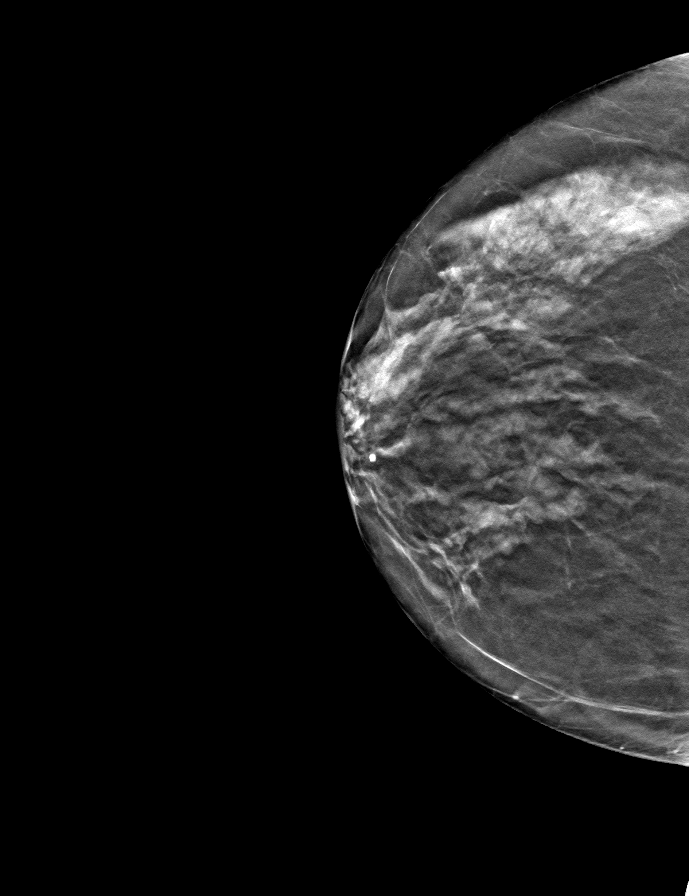

[L CC tomo · tomo slice 27/53.0]
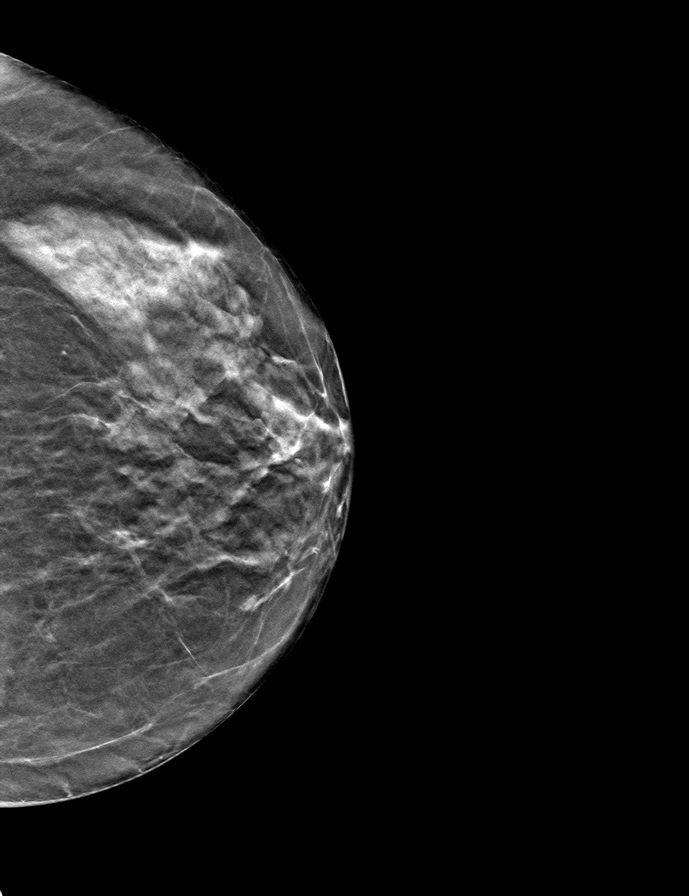

[R MLO tomo · tomo slice 30/59.0]
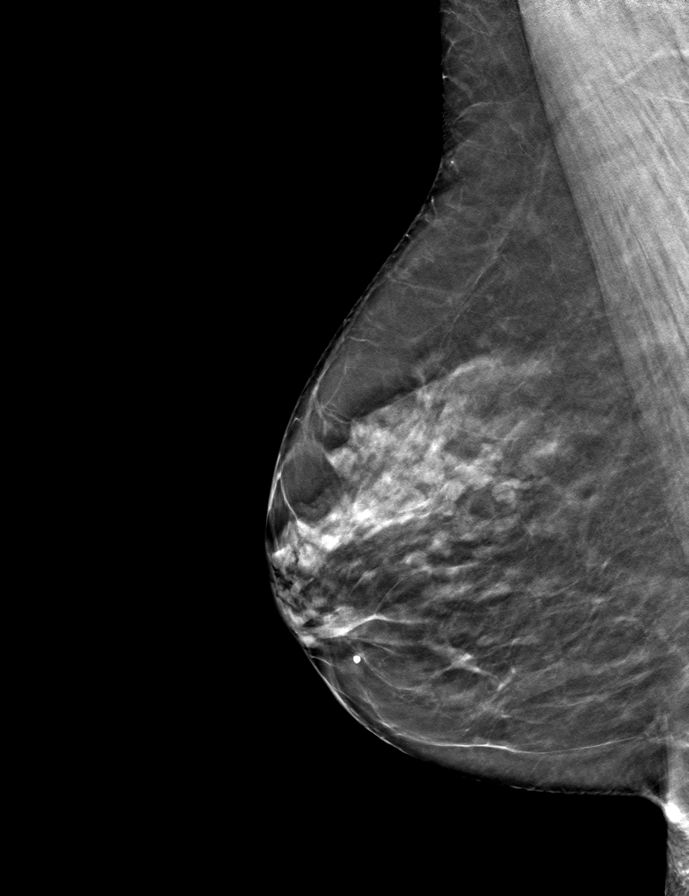

[9 of 24 positions shown; findings below may reference images not displayed]

ACR Breast Density Category c: The breast tissue is heterogeneously
dense, which may obscure small masses.
FINDINGS: There are no findings suspicious for malignancy. Images were
processed with CAD.
IMPRESSION: No mammographic evidence of malignancy. A result letter of this
screening mammogram will be mailed directly to the patient.

RECOMMENDATION:
Screening mammogram in one year. (Code:[id number]-LHB)

BI-RADS CATEGORY  1: Negative.

## 2020-11-28 IMAGING — DX CHEST - 2 VIEW
2 series · 2 of 2 positions shown · non-contrast
Comparison: Chest CT March 08, 2019; chest radiograph December 08, 2017

CLINICAL DATA: Cavitary lung lesions

EXAM:
CHEST - 2 VIEW

[dg chest 2 view (1 of 2)]
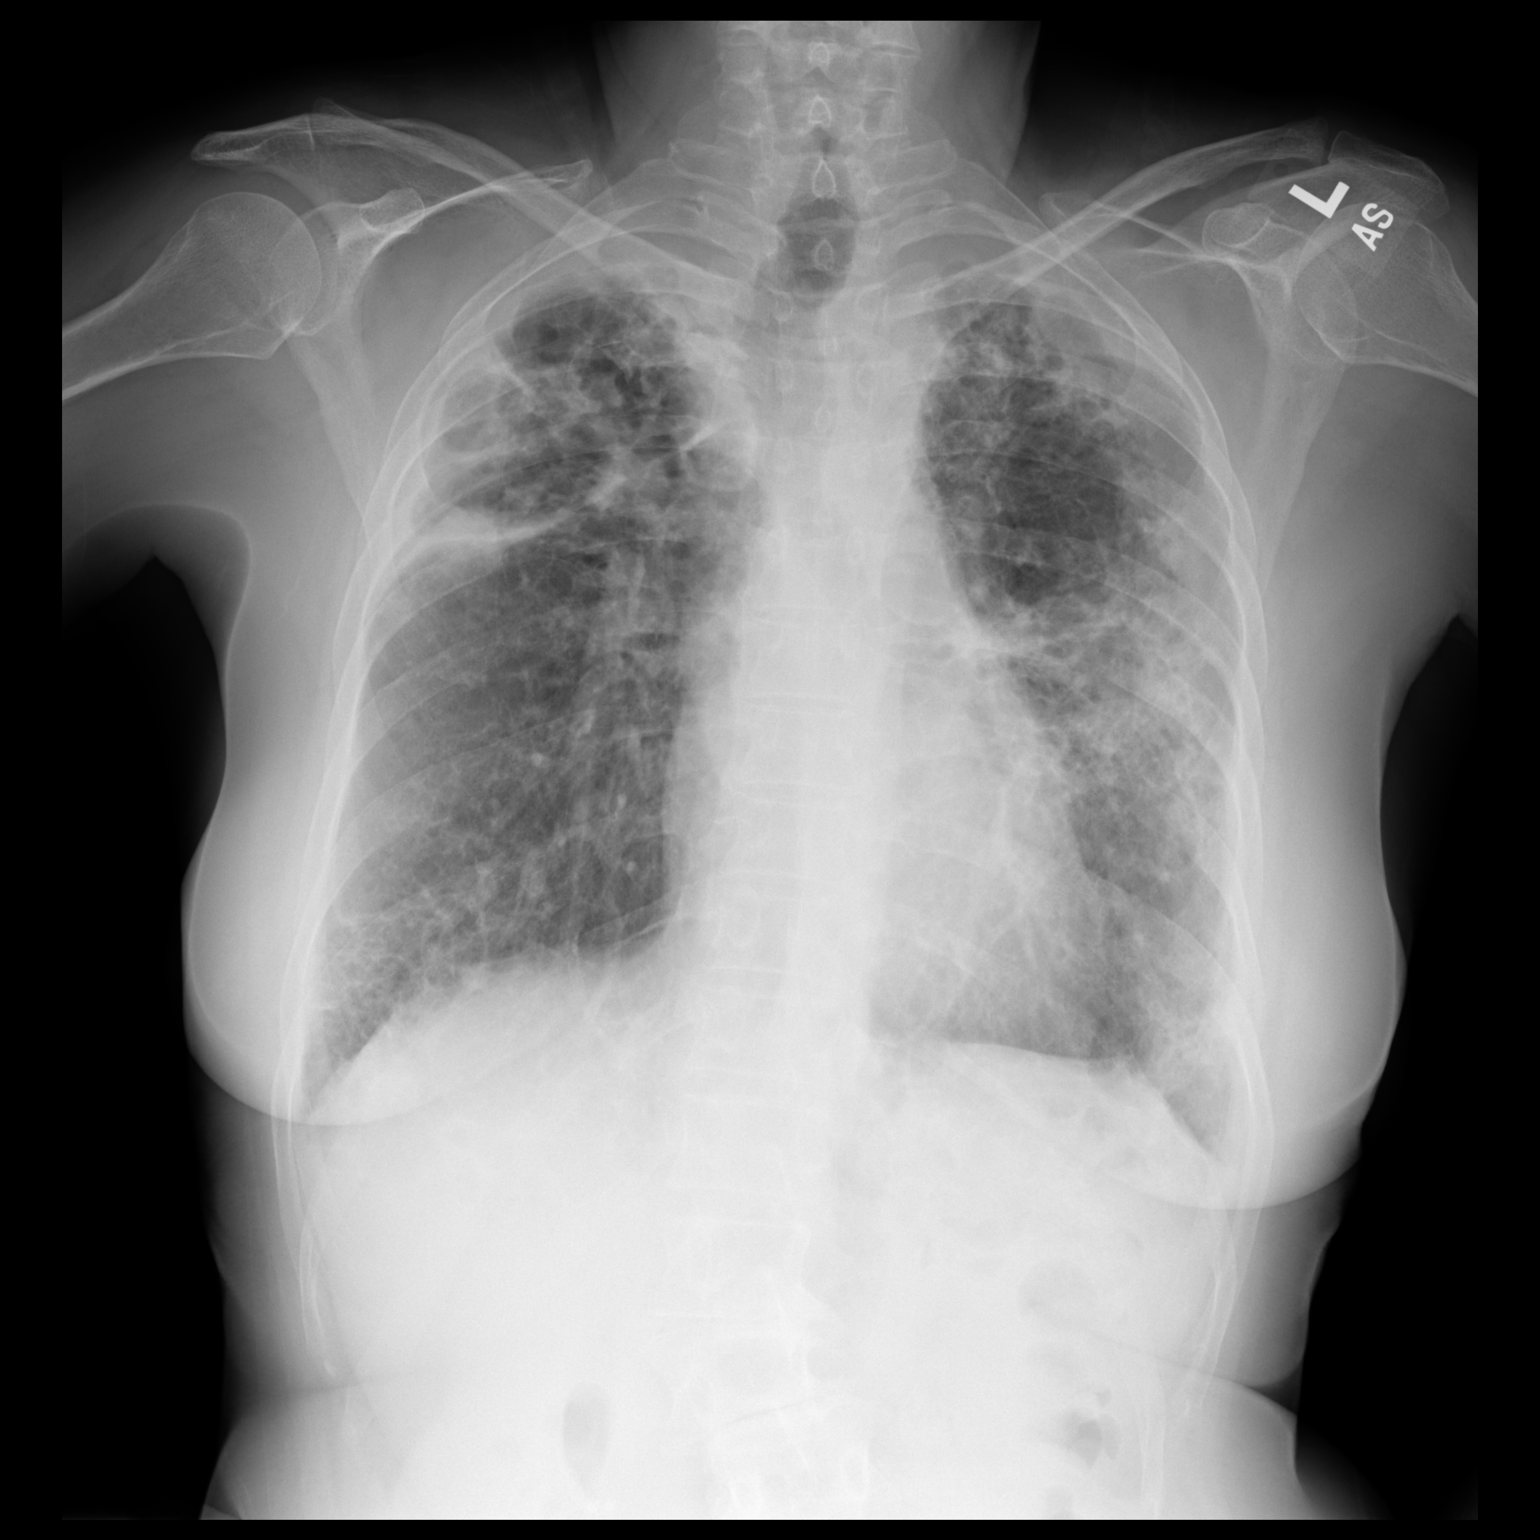

[dg chest 2 view (2 of 2)]
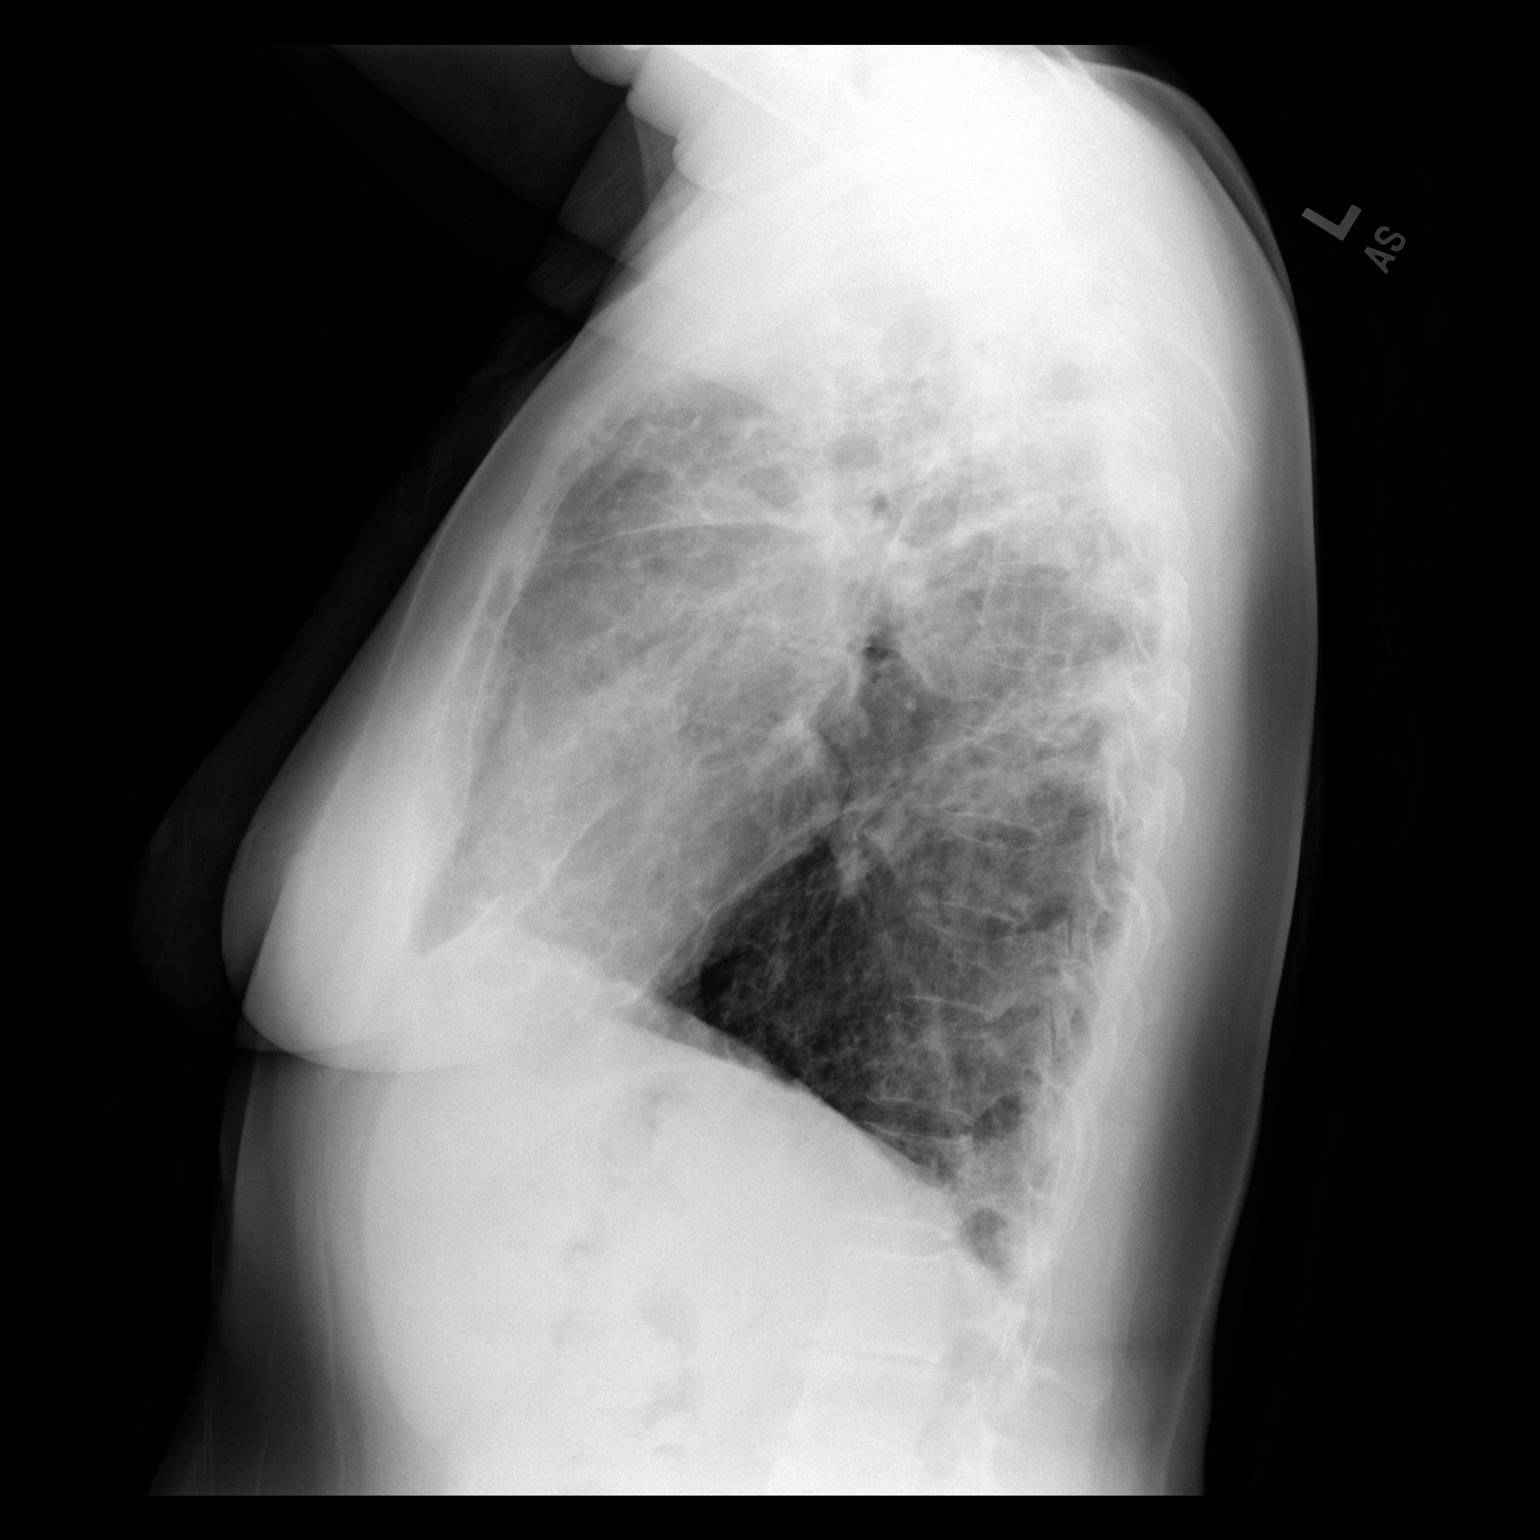

[2 of 2 positions shown; findings below may reference images not displayed]

FINDINGS: There Is extensive scarring throughout the lungs, most severe in the
upper lobes, also present previously. There are areas of soft tissue
thickening and cavitation in the upper lobes, somewhat more notable
on the right than on the left, similar to most recent CT and
slightly increased compared to the 5952 study. Consolidation in the
left mid lung with peripheral bronchiectasis and soft tissue
thickening remains stable. Overall, there is no new lesion evident
in the lung parenchyma.

Heart size is normal. The pulmonary vascularity is normal.
Adenopathy is not well seen by radiography and better seen on CT. No
bone lesions.
IMPRESSION: Widespread upper lobe fibrocavitary changes, similar to most recent
CT and increased from December 2017. Ill-defined increased opacity in
the left mid lung is stable. Fibrosis in the bases is stable. The
overall appearance of the lungs is essentially stable compared to
recent CT.

Heart size normal. Stable appearance of the mediastinum without
adenopathy appreciable by radiography; mild adenopathy cyst is noted
on prior CT.

## 2020-12-11 ENCOUNTER — Telehealth: Payer: Self-pay | Admitting: Internal Medicine

## 2020-12-11 NOTE — Telephone Encounter (Signed)
Dr. [name], Please sign care plan faxed by [facility] so we can return it to them.  This is for payment purposes and their records.  Thank you.

## 2020-12-16 NOTE — Telephone Encounter (Signed)
Victorino Dike from [facility] calling about the fax that they need signed Victorino Dike can be reached at [phone]

## 2020-12-16 NOTE — Telephone Encounter (Signed)
Checked through all of MR's papers in the pod as well as up in his office and did not see any fax in there on pt.  Kimberly Shelton with [facility] letting her know that I did not see a fax. She said that they know that pt is deceased but they are still needing to get the plan of care signed and sent back to them for when she was receiving care prior to her passing. She said that they will refax it again to our office.  Will keep an eye out for fax.

## 2020-12-17 NOTE — Telephone Encounter (Signed)
Fax has been received and has been placed for MR to sign. Will update once I have received it back from MR so it can be faxed.

## 2020-12-18 NOTE — Telephone Encounter (Signed)
Form signed by MR and I have faxed back to [facility] attn [place]
